# Patient Record
Sex: Female | Born: 1940 | Race: Black or African American | Hispanic: No | Marital: Married | Smoking: Never smoker
Health system: Southern US, Community
[De-identification: ages and names within clinical notes are randomized; demographics above are authoritative.]

## PROBLEM LIST (undated history)

## (undated) DIAGNOSIS — R569 Unspecified convulsions: Secondary | ICD-10-CM

## (undated) DIAGNOSIS — K449 Diaphragmatic hernia without obstruction or gangrene: Secondary | ICD-10-CM

## (undated) DIAGNOSIS — K219 Gastro-esophageal reflux disease without esophagitis: Secondary | ICD-10-CM

## (undated) DIAGNOSIS — R55 Syncope and collapse: Secondary | ICD-10-CM

## (undated) DIAGNOSIS — I10 Essential (primary) hypertension: Secondary | ICD-10-CM

## (undated) DIAGNOSIS — D72819 Decreased white blood cell count, unspecified: Secondary | ICD-10-CM

## (undated) DIAGNOSIS — C801 Malignant (primary) neoplasm, unspecified: Secondary | ICD-10-CM

## (undated) HISTORY — DX: Syncope and collapse: R55

## (undated) HISTORY — DX: Decreased white blood cell count, unspecified: D72.819

## (undated) HISTORY — PX: COLON SURGERY: SHX602

## (undated) HISTORY — DX: Diaphragmatic hernia without obstruction or gangrene: K44.9

---

## 2000-01-27 ENCOUNTER — Encounter: Payer: Self-pay | Admitting: Family Medicine

## 2000-01-27 ENCOUNTER — Encounter: Admission: RE | Admit: 2000-01-27 | Discharge: 2000-01-27 | Payer: Self-pay | Admitting: Family Medicine

## 2001-02-04 ENCOUNTER — Encounter: Payer: Self-pay | Admitting: Family Medicine

## 2001-02-04 ENCOUNTER — Encounter: Admission: RE | Admit: 2001-02-04 | Discharge: 2001-02-04 | Payer: Self-pay | Admitting: Family Medicine

## 2003-11-23 ENCOUNTER — Encounter: Admission: RE | Admit: 2003-11-23 | Discharge: 2003-11-23 | Payer: Self-pay | Admitting: Family Medicine

## 2004-11-22 ENCOUNTER — Emergency Department (HOSPITAL_COMMUNITY): Admission: EM | Admit: 2004-11-22 | Discharge: 2004-11-23 | Payer: Self-pay | Admitting: Emergency Medicine

## 2004-11-28 ENCOUNTER — Inpatient Hospital Stay (HOSPITAL_COMMUNITY): Admission: EM | Admit: 2004-11-28 | Discharge: 2004-11-30 | Payer: Self-pay | Admitting: Emergency Medicine

## 2004-11-28 ENCOUNTER — Encounter (INDEPENDENT_AMBULATORY_CARE_PROVIDER_SITE_OTHER): Payer: Self-pay | Admitting: Specialist

## 2005-08-16 ENCOUNTER — Ambulatory Visit: Payer: Self-pay | Admitting: Gastroenterology

## 2005-10-03 ENCOUNTER — Ambulatory Visit: Payer: Self-pay | Admitting: Gastroenterology

## 2005-10-10 ENCOUNTER — Ambulatory Visit: Payer: Self-pay | Admitting: Gastroenterology

## 2006-09-04 ENCOUNTER — Encounter: Admission: RE | Admit: 2006-09-04 | Discharge: 2006-09-04 | Payer: Self-pay | Admitting: Family Medicine

## 2008-03-13 ENCOUNTER — Encounter: Admission: RE | Admit: 2008-03-13 | Discharge: 2008-03-13 | Payer: Self-pay | Admitting: Family Medicine

## 2008-04-17 ENCOUNTER — Telehealth: Payer: Self-pay | Admitting: Gastroenterology

## 2008-05-12 ENCOUNTER — Ambulatory Visit: Payer: Self-pay | Admitting: Gastroenterology

## 2008-05-12 DIAGNOSIS — K219 Gastro-esophageal reflux disease without esophagitis: Secondary | ICD-10-CM | POA: Insufficient documentation

## 2008-06-04 ENCOUNTER — Ambulatory Visit: Payer: Self-pay | Admitting: Gastroenterology

## 2008-06-17 ENCOUNTER — Ambulatory Visit: Payer: Self-pay | Admitting: Gastroenterology

## 2008-07-24 HISTORY — PX: COLON SURGERY: SHX602

## 2009-05-05 ENCOUNTER — Encounter: Admission: RE | Admit: 2009-05-05 | Discharge: 2009-05-05 | Payer: Self-pay | Admitting: Family Medicine

## 2009-05-12 ENCOUNTER — Encounter: Admission: RE | Admit: 2009-05-12 | Discharge: 2009-05-12 | Payer: Self-pay | Admitting: General Surgery

## 2009-05-27 ENCOUNTER — Ambulatory Visit (HOSPITAL_COMMUNITY): Admission: RE | Admit: 2009-05-27 | Discharge: 2009-05-27 | Payer: Self-pay | Admitting: General Surgery

## 2009-05-27 ENCOUNTER — Ambulatory Visit: Payer: Self-pay | Admitting: Cardiology

## 2009-05-27 ENCOUNTER — Encounter: Admission: RE | Admit: 2009-05-27 | Discharge: 2009-05-27 | Payer: Self-pay | Admitting: General Surgery

## 2009-05-27 ENCOUNTER — Inpatient Hospital Stay (HOSPITAL_COMMUNITY): Admission: AD | Admit: 2009-05-27 | Discharge: 2009-06-14 | Payer: Self-pay | Admitting: General Surgery

## 2009-06-01 ENCOUNTER — Encounter (INDEPENDENT_AMBULATORY_CARE_PROVIDER_SITE_OTHER): Payer: Self-pay | Admitting: General Surgery

## 2009-06-09 ENCOUNTER — Ambulatory Visit: Payer: Self-pay | Admitting: Oncology

## 2009-06-09 ENCOUNTER — Encounter: Payer: Self-pay | Admitting: Oncology

## 2009-06-10 ENCOUNTER — Encounter (INDEPENDENT_AMBULATORY_CARE_PROVIDER_SITE_OTHER): Payer: Self-pay | Admitting: General Surgery

## 2009-06-23 LAB — COMPREHENSIVE METABOLIC PANEL
AST: 26 U/L (ref 0–37)
Albumin: 3.2 g/dL — ABNORMAL LOW (ref 3.5–5.2)
Alkaline Phosphatase: 70 U/L (ref 39–117)
BUN: 5 mg/dL — ABNORMAL LOW (ref 6–23)
Potassium: 3.4 mEq/L — ABNORMAL LOW (ref 3.5–5.3)
Sodium: 140 mEq/L (ref 135–145)

## 2009-06-23 LAB — MORPHOLOGY: RBC Comments: NORMAL

## 2009-06-23 LAB — CBC WITH DIFFERENTIAL/PLATELET
Eosinophils Absolute: 0.1 10*3/uL (ref 0.0–0.5)
MONO#: 0.5 10*3/uL (ref 0.1–0.9)
MONO%: 7.5 % (ref 0.0–14.0)
NEUT#: 4.6 10*3/uL (ref 1.5–6.5)
RBC: 3.88 10*6/uL (ref 3.70–5.45)
RDW: 16.6 % — ABNORMAL HIGH (ref 11.2–14.5)
WBC: 6.1 10*3/uL (ref 3.9–10.3)
lymph#: 0.9 10*3/uL (ref 0.9–3.3)

## 2009-07-02 ENCOUNTER — Ambulatory Visit (HOSPITAL_BASED_OUTPATIENT_CLINIC_OR_DEPARTMENT_OTHER): Admission: RE | Admit: 2009-07-02 | Discharge: 2009-07-02 | Payer: Self-pay | Admitting: General Surgery

## 2009-07-05 LAB — CBC WITH DIFFERENTIAL/PLATELET
Basophils Absolute: 0 10*3/uL (ref 0.0–0.1)
Eosinophils Absolute: 0.1 10*3/uL (ref 0.0–0.5)
LYMPH%: 7.8 % — ABNORMAL LOW (ref 14.0–49.7)
MCV: 86.6 fL (ref 79.5–101.0)
MONO%: 2.4 % (ref 0.0–14.0)
NEUT#: 6.7 10*3/uL — ABNORMAL HIGH (ref 1.5–6.5)
NEUT%: 87.7 % — ABNORMAL HIGH (ref 38.4–76.8)
Platelets: ADEQUATE 10*3/uL (ref 145–400)
RBC: 4.04 10*6/uL (ref 3.70–5.45)
nRBC: 0 % (ref 0–0)

## 2009-07-07 ENCOUNTER — Encounter: Payer: Self-pay | Admitting: Gastroenterology

## 2009-07-14 ENCOUNTER — Ambulatory Visit: Payer: Self-pay | Admitting: Oncology

## 2009-07-19 LAB — CBC WITH DIFFERENTIAL/PLATELET
Basophils Absolute: 0.1 10*3/uL (ref 0.0–0.1)
EOS%: 0.1 % (ref 0.0–7.0)
HCT: 29.8 % — ABNORMAL LOW (ref 34.8–46.6)
HGB: 10 g/dL — ABNORMAL LOW (ref 11.6–15.9)
MCH: 29.3 pg (ref 25.1–34.0)
MCV: 87.4 fL (ref 79.5–101.0)
MONO%: 9.4 % (ref 0.0–14.0)
NEUT%: 70 % (ref 38.4–76.8)
Platelets: 389 10*3/uL (ref 145–400)

## 2009-07-19 LAB — COMPREHENSIVE METABOLIC PANEL
AST: 11 U/L (ref 0–37)
Albumin: 3.9 g/dL (ref 3.5–5.2)
BUN: 11 mg/dL (ref 6–23)
Calcium: 8.6 mg/dL (ref 8.4–10.5)
Chloride: 105 mEq/L (ref 96–112)
Glucose, Bld: 84 mg/dL (ref 70–99)
Potassium: 3.3 mEq/L — ABNORMAL LOW (ref 3.5–5.3)
Sodium: 141 mEq/L (ref 135–145)
Total Protein: 6.4 g/dL (ref 6.0–8.3)

## 2009-07-23 LAB — WOUND CULTURE

## 2009-07-26 LAB — CBC WITH DIFFERENTIAL/PLATELET
Eosinophils Absolute: 0 10*3/uL (ref 0.0–0.5)
HCT: 29.8 % — ABNORMAL LOW (ref 34.8–46.6)
LYMPH%: 5.7 % — ABNORMAL LOW (ref 14.0–49.7)
MONO#: 0.3 10*3/uL (ref 0.1–0.9)
NEUT#: 6.8 10*3/uL — ABNORMAL HIGH (ref 1.5–6.5)
NEUT%: 90.3 % — ABNORMAL HIGH (ref 38.4–76.8)
Platelets: 543 10*3/uL — ABNORMAL HIGH (ref 145–400)
RBC: 3.46 10*6/uL — ABNORMAL LOW (ref 3.70–5.45)
WBC: 7.5 10*3/uL (ref 3.9–10.3)

## 2009-08-09 LAB — COMPREHENSIVE METABOLIC PANEL
ALT: 13 U/L (ref 0–35)
AST: 13 U/L (ref 0–37)
Alkaline Phosphatase: 71 U/L (ref 39–117)
Creatinine, Ser: 0.64 mg/dL (ref 0.40–1.20)
Total Bilirubin: 0.2 mg/dL — ABNORMAL LOW (ref 0.3–1.2)

## 2009-08-09 LAB — CBC WITH DIFFERENTIAL/PLATELET
BASO%: 0.5 % (ref 0.0–2.0)
Basophils Absolute: 0 10*3/uL (ref 0.0–0.1)
HCT: 28.7 % — ABNORMAL LOW (ref 34.8–46.6)
LYMPH%: 15.4 % (ref 14.0–49.7)
MCHC: 31.7 g/dL (ref 31.5–36.0)
MONO#: 0.6 10*3/uL (ref 0.1–0.9)
NEUT%: 72.2 % (ref 38.4–76.8)
Platelets: 222 10*3/uL (ref 145–400)
WBC: 6.1 10*3/uL (ref 3.9–10.3)

## 2009-08-09 LAB — MORPHOLOGY: PLT EST: ADEQUATE

## 2009-08-12 ENCOUNTER — Ambulatory Visit: Payer: Self-pay | Admitting: Oncology

## 2009-08-16 LAB — CBC WITH DIFFERENTIAL/PLATELET
Basophils Absolute: 0 10*3/uL (ref 0.0–0.1)
Eosinophils Absolute: 0 10*3/uL (ref 0.0–0.5)
HGB: 9.6 g/dL — ABNORMAL LOW (ref 11.6–15.9)
LYMPH%: 6.3 % — ABNORMAL LOW (ref 14.0–49.7)
MCV: 88.1 fL (ref 79.5–101.0)
MONO#: 0.2 10*3/uL (ref 0.1–0.9)
MONO%: 3.8 % (ref 0.0–14.0)
NEUT#: 5.4 10*3/uL (ref 1.5–6.5)
Platelets: 358 10*3/uL (ref 145–400)
WBC: 6.1 10*3/uL (ref 3.9–10.3)

## 2009-08-30 LAB — CBC WITH DIFFERENTIAL/PLATELET
Basophils Absolute: 0 10*3/uL (ref 0.0–0.1)
EOS%: 1.7 % (ref 0.0–7.0)
HCT: 25.1 % — ABNORMAL LOW (ref 34.8–46.6)
HGB: 8 g/dL — ABNORMAL LOW (ref 11.6–15.9)
LYMPH%: 22.3 % (ref 14.0–49.7)
MCH: 28.6 pg (ref 25.1–34.0)
MCV: 89.6 fL (ref 79.5–101.0)
MONO%: 10.4 % (ref 0.0–14.0)
NEUT%: 65.1 % (ref 38.4–76.8)
Platelets: 215 10*3/uL (ref 145–400)
lymph#: 0.9 10*3/uL (ref 0.9–3.3)

## 2009-08-30 LAB — COMPREHENSIVE METABOLIC PANEL
AST: 13 U/L (ref 0–37)
BUN: 8 mg/dL (ref 6–23)
Calcium: 9.2 mg/dL (ref 8.4–10.5)
Chloride: 107 mEq/L (ref 96–112)
Creatinine, Ser: 0.59 mg/dL (ref 0.40–1.20)

## 2009-08-30 LAB — LACTATE DEHYDROGENASE: LDH: 318 U/L — ABNORMAL HIGH (ref 94–250)

## 2009-09-06 LAB — CBC WITH DIFFERENTIAL/PLATELET
Eosinophils Absolute: 0 10*3/uL (ref 0.0–0.5)
MONO#: 0.4 10*3/uL (ref 0.1–0.9)
NEUT#: 2.4 10*3/uL (ref 1.5–6.5)
Platelets: 368 10*3/uL (ref 145–400)
RBC: 2.91 10*6/uL — ABNORMAL LOW (ref 3.70–5.45)
RDW: 18 % — ABNORMAL HIGH (ref 11.2–14.5)
WBC: 3.4 10*3/uL — ABNORMAL LOW (ref 3.9–10.3)
nRBC: 0 % (ref 0–0)

## 2009-09-20 ENCOUNTER — Ambulatory Visit: Payer: Self-pay | Admitting: Oncology

## 2009-09-20 ENCOUNTER — Encounter (HOSPITAL_COMMUNITY): Admission: RE | Admit: 2009-09-20 | Discharge: 2009-12-14 | Payer: Self-pay | Admitting: Oncology

## 2009-09-20 LAB — CBC WITH DIFFERENTIAL/PLATELET
Basophils Absolute: 0 10*3/uL (ref 0.0–0.1)
HCT: 22.8 % — ABNORMAL LOW (ref 34.8–46.6)
HGB: 7.7 g/dL — ABNORMAL LOW (ref 11.6–15.9)
MONO#: 0.4 10*3/uL (ref 0.1–0.9)
NEUT#: 3.2 10*3/uL (ref 1.5–6.5)
NEUT%: 74.7 % (ref 38.4–76.8)
WBC: 4.2 10*3/uL (ref 3.9–10.3)
lymph#: 0.6 10*3/uL — ABNORMAL LOW (ref 0.9–3.3)

## 2009-09-20 LAB — COMPREHENSIVE METABOLIC PANEL
AST: 15 U/L (ref 0–37)
BUN: 8 mg/dL (ref 6–23)
CO2: 24 mEq/L (ref 19–32)
Chloride: 108 mEq/L (ref 96–112)
Creatinine, Ser: 0.61 mg/dL (ref 0.40–1.20)
Total Protein: 6.3 g/dL (ref 6.0–8.3)

## 2009-09-20 LAB — TYPE & CROSSMATCH - CHCC

## 2009-09-22 ENCOUNTER — Ambulatory Visit (HOSPITAL_COMMUNITY): Admission: RE | Admit: 2009-09-22 | Discharge: 2009-09-22 | Payer: Self-pay | Admitting: Oncology

## 2009-10-07 ENCOUNTER — Ambulatory Visit (HOSPITAL_COMMUNITY): Admission: RE | Admit: 2009-10-07 | Discharge: 2009-10-07 | Payer: Self-pay | Admitting: Oncology

## 2009-11-11 ENCOUNTER — Encounter: Admission: RE | Admit: 2009-11-11 | Discharge: 2009-11-11 | Payer: Self-pay | Admitting: General Surgery

## 2009-11-17 ENCOUNTER — Ambulatory Visit: Payer: Self-pay | Admitting: Oncology

## 2009-11-29 ENCOUNTER — Inpatient Hospital Stay (HOSPITAL_COMMUNITY): Admission: RE | Admit: 2009-11-29 | Discharge: 2009-12-08 | Payer: Self-pay | Admitting: General Surgery

## 2009-11-29 ENCOUNTER — Encounter (INDEPENDENT_AMBULATORY_CARE_PROVIDER_SITE_OTHER): Payer: Self-pay | Admitting: General Surgery

## 2009-12-17 ENCOUNTER — Ambulatory Visit: Payer: Self-pay | Admitting: Oncology

## 2009-12-21 LAB — CBC WITH DIFFERENTIAL/PLATELET
Basophils Absolute: 0 10*3/uL (ref 0.0–0.1)
Eosinophils Absolute: 0.3 10*3/uL (ref 0.0–0.5)
HGB: 9.9 g/dL — ABNORMAL LOW (ref 11.6–15.9)
MONO#: 0.3 10*3/uL (ref 0.1–0.9)
NEUT#: 2.6 10*3/uL (ref 1.5–6.5)
Platelets: 329 10*3/uL (ref 145–400)
RBC: 3.34 10*6/uL — ABNORMAL LOW (ref 3.70–5.45)
RDW: 14 % (ref 11.2–14.5)
WBC: 3.9 10*3/uL (ref 3.9–10.3)

## 2009-12-21 LAB — COMPREHENSIVE METABOLIC PANEL
ALT: 10 U/L (ref 0–35)
AST: 13 U/L (ref 0–37)
Alkaline Phosphatase: 60 U/L (ref 39–117)
Potassium: 3 mEq/L — ABNORMAL LOW (ref 3.5–5.3)
Sodium: 141 mEq/L (ref 135–145)
Total Bilirubin: 0.7 mg/dL (ref 0.3–1.2)
Total Protein: 6.4 g/dL (ref 6.0–8.3)

## 2009-12-21 LAB — MORPHOLOGY: PLT EST: ADEQUATE

## 2009-12-31 LAB — BASIC METABOLIC PANEL
BUN: 12 mg/dL (ref 6–23)
Creatinine, Ser: 0.75 mg/dL (ref 0.40–1.20)
Potassium: 4.3 mEq/L (ref 3.5–5.3)

## 2010-02-01 ENCOUNTER — Ambulatory Visit (HOSPITAL_BASED_OUTPATIENT_CLINIC_OR_DEPARTMENT_OTHER): Admission: RE | Admit: 2010-02-01 | Discharge: 2010-02-01 | Payer: Self-pay | Admitting: General Surgery

## 2010-02-10 ENCOUNTER — Ambulatory Visit: Payer: Self-pay | Admitting: Oncology

## 2010-02-10 ENCOUNTER — Ambulatory Visit (HOSPITAL_COMMUNITY): Admission: RE | Admit: 2010-02-10 | Discharge: 2010-02-10 | Payer: Self-pay | Admitting: Oncology

## 2010-02-10 LAB — CBC WITH DIFFERENTIAL/PLATELET
BASO%: 0.4 % (ref 0.0–2.0)
Basophils Absolute: 0 10*3/uL (ref 0.0–0.1)
Eosinophils Absolute: 0.1 10*3/uL (ref 0.0–0.5)
HCT: 30.2 % — ABNORMAL LOW (ref 34.8–46.6)
HGB: 9.9 g/dL — ABNORMAL LOW (ref 11.6–15.9)
LYMPH%: 36.3 % (ref 14.0–49.7)
MONO#: 0.2 10*3/uL (ref 0.1–0.9)
NEUT#: 1.4 10*3/uL — ABNORMAL LOW (ref 1.5–6.5)
NEUT%: 52.6 % (ref 38.4–76.8)
Platelets: 175 10*3/uL (ref 145–400)
WBC: 2.7 10*3/uL — ABNORMAL LOW (ref 3.9–10.3)
lymph#: 1 10*3/uL (ref 0.9–3.3)

## 2010-02-10 LAB — COMPREHENSIVE METABOLIC PANEL
AST: 15 U/L (ref 0–37)
Albumin: 4.2 g/dL (ref 3.5–5.2)
BUN: 12 mg/dL (ref 6–23)
CO2: 26 mEq/L (ref 19–32)
Calcium: 9 mg/dL (ref 8.4–10.5)
Chloride: 108 mEq/L (ref 96–112)
Creatinine, Ser: 0.62 mg/dL (ref 0.40–1.20)
Potassium: 3.9 mEq/L (ref 3.5–5.3)

## 2010-02-10 LAB — LACTATE DEHYDROGENASE: LDH: 110 U/L (ref 94–250)

## 2010-03-15 ENCOUNTER — Ambulatory Visit: Payer: Self-pay | Admitting: Oncology

## 2010-03-17 LAB — CBC WITH DIFFERENTIAL/PLATELET
Eosinophils Absolute: 0.1 10*3/uL (ref 0.0–0.5)
HCT: 31.8 % — ABNORMAL LOW (ref 34.8–46.6)
LYMPH%: 39.7 % (ref 14.0–49.7)
MCHC: 32.7 g/dL (ref 31.5–36.0)
MCV: 87.4 fL (ref 79.5–101.0)
MONO#: 0.3 10*3/uL (ref 0.1–0.9)
MONO%: 10.1 % (ref 0.0–14.0)
NEUT#: 1.1 10*3/uL — ABNORMAL LOW (ref 1.5–6.5)
NEUT%: 46.2 % (ref 38.4–76.8)
Platelets: 181 10*3/uL (ref 145–400)
RBC: 3.64 10*6/uL — ABNORMAL LOW (ref 3.70–5.45)

## 2010-03-17 LAB — COMPREHENSIVE METABOLIC PANEL
Alkaline Phosphatase: 76 U/L (ref 39–117)
CO2: 25 mEq/L (ref 19–32)
Creatinine, Ser: 0.65 mg/dL (ref 0.40–1.20)
Glucose, Bld: 97 mg/dL (ref 70–99)
Sodium: 140 mEq/L (ref 135–145)
Total Bilirubin: 0.3 mg/dL (ref 0.3–1.2)
Total Protein: 6.7 g/dL (ref 6.0–8.3)

## 2010-03-17 LAB — LACTATE DEHYDROGENASE: LDH: 128 U/L (ref 94–250)

## 2010-06-17 ENCOUNTER — Ambulatory Visit: Payer: Self-pay | Admitting: Oncology

## 2010-06-21 ENCOUNTER — Ambulatory Visit (HOSPITAL_COMMUNITY)
Admission: RE | Admit: 2010-06-21 | Discharge: 2010-06-21 | Payer: Self-pay | Source: Home / Self Care | Admitting: Oncology

## 2010-06-21 LAB — CBC WITH DIFFERENTIAL/PLATELET
Basophils Absolute: 0 10*3/uL (ref 0.0–0.1)
HCT: 32.8 % — ABNORMAL LOW (ref 34.8–46.6)
HGB: 11 g/dL — ABNORMAL LOW (ref 11.6–15.9)
LYMPH%: 38.2 % (ref 14.0–49.7)
MCH: 30 pg (ref 25.1–34.0)
MONO#: 0.3 10*3/uL (ref 0.1–0.9)
NEUT%: 43.1 % (ref 38.4–76.8)
Platelets: 210 10*3/uL (ref 145–400)
lymph#: 0.8 10*3/uL — ABNORMAL LOW (ref 0.9–3.3)

## 2010-06-21 LAB — COMPREHENSIVE METABOLIC PANEL
ALT: 27 U/L (ref 0–35)
AST: 22 U/L (ref 0–37)
Albumin: 4 g/dL (ref 3.5–5.2)
Alkaline Phosphatase: 81 U/L (ref 39–117)
Potassium: 3.6 mEq/L (ref 3.5–5.3)
Sodium: 141 mEq/L (ref 135–145)
Total Protein: 6.6 g/dL (ref 6.0–8.3)

## 2010-06-21 LAB — MORPHOLOGY

## 2010-06-24 ENCOUNTER — Encounter: Payer: Self-pay | Admitting: Gastroenterology

## 2010-08-12 ENCOUNTER — Other Ambulatory Visit: Payer: Self-pay | Admitting: Oncology

## 2010-08-12 DIAGNOSIS — C179 Malignant neoplasm of small intestine, unspecified: Secondary | ICD-10-CM

## 2010-08-12 DIAGNOSIS — C8590 Non-Hodgkin lymphoma, unspecified, unspecified site: Secondary | ICD-10-CM

## 2010-08-14 ENCOUNTER — Encounter: Payer: Self-pay | Admitting: Oncology

## 2010-08-25 NOTE — Letter (Signed)
Summary: [facility]  [facility]   Imported By: Sherian Rein 07/01/2010 07:24:49  _____________________________________________________________________  External Attachment:    Type:   Image     Comment:   External Document

## 2010-10-10 LAB — BASIC METABOLIC PANEL
BUN: 2 mg/dL — ABNORMAL LOW (ref 6–23)
GFR calc Af Amer: >60 mL/min (ref >60)
GFR calc non Af Amer: >60 mL/min (ref >60)
Potassium: 3.4 mEq/L — ABNORMAL LOW (ref 3.5–5.1)
Sodium: 142 mEq/L (ref 135–145)

## 2010-10-10 LAB — CBC
HCT: 27 % — ABNORMAL LOW (ref 36.0–46.0)
HCT: 28 % — ABNORMAL LOW (ref 36.0–46.0)
HCT: 28.2 % — ABNORMAL LOW (ref 36.0–46.0)
MCHC: 32.5 g/dL (ref 30.0–36.0)
MCV: 90.6 fL (ref 78.0–100.0)
Platelets: 206 10*3/uL (ref 150–400)
Platelets: 208 10*3/uL (ref 150–400)
Platelets: 246 10*3/uL (ref 150–400)
RDW: 14.3 % (ref 11.5–15.5)
RDW: 15.4 % (ref 11.5–15.5)
WBC: 4 10*3/uL (ref 4.0–10.5)

## 2010-10-11 LAB — CBC
HCT: 21 % — ABNORMAL LOW (ref 36.0–46.0)
HCT: 31.5 % — ABNORMAL LOW (ref 36.0–46.0)
Hemoglobin: 10.2 g/dL — ABNORMAL LOW (ref 12.0–15.0)
Hemoglobin: 10.9 g/dL — ABNORMAL LOW (ref 12.0–15.0)
Hemoglobin: 6.9 g/dL — CL (ref 12.0–15.0)
Hemoglobin: 8.1 g/dL — ABNORMAL LOW (ref 12.0–15.0)
Hemoglobin: 8.6 g/dL — ABNORMAL LOW (ref 12.0–15.0)
MCHC: 32.3 g/dL (ref 30.0–36.0)
MCHC: 32.8 g/dL (ref 30.0–36.0)
MCHC: 33.4 g/dL (ref 30.0–36.0)
MCHC: 33.4 g/dL (ref 30.0–36.0)
MCV: 88.7 fL (ref 78.0–100.0)
MCV: 90 fL (ref 78.0–100.0)
MCV: 92.2 fL (ref 78.0–100.0)
Platelets: 141 10*3/uL — ABNORMAL LOW (ref 150–400)
Platelets: 160 10*3/uL (ref 150–400)
Platelets: 166 10*3/uL (ref 150–400)
Platelets: 197 10*3/uL (ref 150–400)
RBC: 2.92 MIL/uL — ABNORMAL LOW (ref 3.87–5.11)
RDW: 13.7 % (ref 11.5–15.5)
RDW: 14.1 % (ref 11.5–15.5)
RDW: 14.2 % (ref 11.5–15.5)
RDW: 14.4 % (ref 11.5–15.5)
RDW: 14.9 % (ref 11.5–15.5)
WBC: 2.9 10*3/uL — ABNORMAL LOW (ref 4.0–10.5)
WBC: 3 10*3/uL — ABNORMAL LOW (ref 4.0–10.5)

## 2010-10-11 LAB — POCT I-STAT 4, (NA,K, GLUC, HGB,HCT)
Glucose, Bld: 93 mg/dL (ref 70–99)
Glucose, Bld: 99 mg/dL (ref 70–99)
HCT: 20 % — ABNORMAL LOW (ref 36.0–46.0)
HCT: 26 % — ABNORMAL LOW (ref 36.0–46.0)
Hemoglobin: 6.8 g/dL — CL (ref 12.0–15.0)
Potassium: 4 mEq/L (ref 3.5–5.1)
Sodium: 140 mEq/L (ref 135–145)
Sodium: 141 mEq/L (ref 135–145)

## 2010-10-11 LAB — BASIC METABOLIC PANEL
BUN: 8 mg/dL (ref 6–23)
CO2: 27 mEq/L (ref 19–32)
Calcium: 8.4 mg/dL (ref 8.4–10.5)
Chloride: 105 mEq/L (ref 96–112)
GFR calc Af Amer: >60 mL/min (ref >60)
GFR calc non Af Amer: >60 mL/min (ref >60)
GFR calc non Af Amer: >60 mL/min (ref >60)
Glucose, Bld: 106 mg/dL — ABNORMAL HIGH (ref 70–99)
Glucose, Bld: 137 mg/dL — ABNORMAL HIGH (ref 70–99)
Potassium: 3.8 mEq/L (ref 3.5–5.1)
Potassium: 4.8 mEq/L (ref 3.5–5.1)
Sodium: 137 mEq/L (ref 135–145)
Sodium: 140 mEq/L (ref 135–145)

## 2010-10-11 LAB — CROSSMATCH: Antibody Screen: NEGATIVE

## 2010-10-11 LAB — POCT I-STAT EG7
Calcium, Ion: 1.17 mmol/L (ref 1.12–1.32)
O2 Saturation: 99 %
Potassium: 4 mEq/L (ref 3.5–5.1)
Sodium: 141 mEq/L (ref 135–145)
TCO2: 28 mmol/L (ref 0–100)
pCO2, Ven: 47.8 mmHg (ref 45.0–50.0)
pH, Ven: 7.352 — ABNORMAL HIGH (ref 7.250–7.300)

## 2010-10-11 LAB — COMPREHENSIVE METABOLIC PANEL
AST: 14 U/L (ref 0–37)
Albumin: 4 g/dL (ref 3.5–5.2)
Alkaline Phosphatase: 77 U/L (ref 39–117)
BUN: 12 mg/dL (ref 6–23)
Chloride: 106 mEq/L (ref 96–112)
Potassium: 3.9 mEq/L (ref 3.5–5.1)
Total Bilirubin: 0.5 mg/dL (ref 0.3–1.2)

## 2010-10-11 LAB — DIFFERENTIAL
Basophils Absolute: 0 10*3/uL (ref 0.0–0.1)
Lymphocytes Relative: 24 % (ref 12–46)
Neutro Abs: 1.6 10*3/uL — ABNORMAL LOW (ref 1.7–7.7)
Neutrophils Relative %: 58 % (ref 43–77)

## 2010-10-12 LAB — CROSSMATCH
ABO/RH(D): A POS
Antibody Screen: NEGATIVE

## 2010-10-20 ENCOUNTER — Other Ambulatory Visit: Payer: Self-pay | Admitting: Oncology

## 2010-10-20 ENCOUNTER — Ambulatory Visit (HOSPITAL_COMMUNITY)
Admission: RE | Admit: 2010-10-20 | Discharge: 2010-10-20 | Disposition: A | Discharge status: Home / Self Care | Payer: Medicare Other | Source: Ambulatory Visit | Attending: Oncology | Admitting: Oncology

## 2010-10-20 ENCOUNTER — Ambulatory Visit (HOSPITAL_COMMUNITY): Admission: RE | Admit: 2010-10-20 | Payer: Self-pay | Source: Ambulatory Visit

## 2010-10-20 ENCOUNTER — Encounter (HOSPITAL_BASED_OUTPATIENT_CLINIC_OR_DEPARTMENT_OTHER): Payer: Medicare Other | Admitting: Oncology

## 2010-10-20 DIAGNOSIS — K5730 Diverticulosis of large intestine without perforation or abscess without bleeding: Secondary | ICD-10-CM | POA: Insufficient documentation

## 2010-10-20 DIAGNOSIS — K219 Gastro-esophageal reflux disease without esophagitis: Secondary | ICD-10-CM

## 2010-10-20 DIAGNOSIS — C179 Malignant neoplasm of small intestine, unspecified: Secondary | ICD-10-CM

## 2010-10-20 DIAGNOSIS — Z87898 Personal history of other specified conditions: Secondary | ICD-10-CM | POA: Insufficient documentation

## 2010-10-20 DIAGNOSIS — C8589 Other specified types of non-Hodgkin lymphoma, extranodal and solid organ sites: Secondary | ICD-10-CM

## 2010-10-20 DIAGNOSIS — I10 Essential (primary) hypertension: Secondary | ICD-10-CM

## 2010-10-20 DIAGNOSIS — Z09 Encounter for follow-up examination after completed treatment for conditions other than malignant neoplasm: Secondary | ICD-10-CM | POA: Insufficient documentation

## 2010-10-20 DIAGNOSIS — K449 Diaphragmatic hernia without obstruction or gangrene: Secondary | ICD-10-CM | POA: Insufficient documentation

## 2010-10-20 LAB — CMP (CANCER CENTER ONLY)
ALT(SGPT): 18 U/L (ref 10–47)
CO2: 27 mEq/L (ref 18–33)
Calcium: 8.7 mg/dL (ref 8.0–10.3)
Chloride: 103 mEq/L (ref 98–108)
Creat: 0.7 mg/dl (ref 0.6–1.2)
Glucose, Bld: 89 mg/dL (ref 73–118)

## 2010-10-20 LAB — CBC WITH DIFFERENTIAL/PLATELET
BASO%: 0.4 % (ref 0.0–2.0)
EOS%: 4.2 % (ref 0.0–7.0)
Eosinophils Absolute: 0.1 10*3/uL (ref 0.0–0.5)
LYMPH%: 42.3 % (ref 14.0–49.7)
MCH: 28.6 pg (ref 25.1–34.0)
MCHC: 33 g/dL (ref 31.5–36.0)
MCV: 86.6 fL (ref 79.5–101.0)
MONO%: 12.1 % (ref 0.0–14.0)
Platelets: 189 10*3/uL (ref 145–400)
RBC: 3.88 10*6/uL (ref 3.70–5.45)

## 2010-10-20 LAB — MORPHOLOGY: PLT EST: ADEQUATE

## 2010-10-20 LAB — SEDIMENTATION RATE: Sed Rate: 10 mm/hr (ref 0–22)

## 2010-10-20 MED ORDER — IOHEXOL 300 MG/ML  SOLN
100.0000 mL | Freq: Once | INTRAMUSCULAR | Status: AC | PRN
  Administered 2010-10-20: 100 mL via INTRAVENOUS

## 2010-10-25 LAB — BASIC METABOLIC PANEL
BUN: 5 mg/dL — ABNORMAL LOW (ref 6–23)
Creatinine, Ser: 0.61 mg/dL (ref 0.4–1.2)
GFR calc non Af Amer: >60 mL/min (ref >60)
Potassium: 3.8 mEq/L (ref 3.5–5.1)

## 2010-10-25 LAB — POCT HEMOGLOBIN-HEMACUE: Hemoglobin: 10.6 g/dL — ABNORMAL LOW (ref 12.0–15.0)

## 2010-10-26 ENCOUNTER — Other Ambulatory Visit: Payer: Self-pay | Admitting: Oncology

## 2010-10-26 ENCOUNTER — Encounter (HOSPITAL_BASED_OUTPATIENT_CLINIC_OR_DEPARTMENT_OTHER): Payer: Medicare Other | Admitting: Oncology

## 2010-10-26 DIAGNOSIS — K5730 Diverticulosis of large intestine without perforation or abscess without bleeding: Secondary | ICD-10-CM

## 2010-10-26 DIAGNOSIS — C8589 Other specified types of non-Hodgkin lymphoma, extranodal and solid organ sites: Secondary | ICD-10-CM

## 2010-10-26 DIAGNOSIS — I10 Essential (primary) hypertension: Secondary | ICD-10-CM

## 2010-10-26 DIAGNOSIS — L819 Disorder of pigmentation, unspecified: Secondary | ICD-10-CM

## 2010-10-26 DIAGNOSIS — C8583 Other specified types of non-Hodgkin lymphoma, intra-abdominal lymph nodes: Secondary | ICD-10-CM

## 2010-10-26 LAB — CBC
HCT: 23.9 % — ABNORMAL LOW (ref 36.0–46.0)
HCT: 24 % — ABNORMAL LOW (ref 36.0–46.0)
HCT: 26.2 % — ABNORMAL LOW (ref 36.0–46.0)
HCT: 26.7 % — ABNORMAL LOW (ref 36.0–46.0)
HCT: 24.3 % — ABNORMAL LOW (ref 36.0–46.0)
HCT: 25.4 % — ABNORMAL LOW (ref 36.0–46.0)
HCT: 29.5 % — ABNORMAL LOW (ref 36.0–46.0)
HCT: 32.2 % — ABNORMAL LOW (ref 36.0–46.0)
Hemoglobin: 7.9 g/dL — ABNORMAL LOW (ref 12.0–15.0)
Hemoglobin: 8.6 g/dL — ABNORMAL LOW (ref 12.0–15.0)
Hemoglobin: 8.7 g/dL — ABNORMAL LOW (ref 12.0–15.0)
Hemoglobin: 8.4 g/dL — ABNORMAL LOW (ref 12.0–15.0)
Hemoglobin: 8.4 g/dL — ABNORMAL LOW (ref 12.0–15.0)
Hemoglobin: 8.8 g/dL — ABNORMAL LOW (ref 12.0–15.0)
Hemoglobin: 9.7 g/dL — ABNORMAL LOW (ref 12.0–15.0)
MCHC: 32.6 g/dL (ref 30.0–36.0)
MCHC: 32.8 g/dL (ref 30.0–36.0)
MCHC: 32.9 g/dL (ref 30.0–36.0)
MCHC: 33.1 g/dL (ref 30.0–36.0)
MCHC: 33.4 g/dL (ref 30.0–36.0)
MCHC: 32.8 g/dL (ref 30.0–36.0)
MCHC: 33 g/dL (ref 30.0–36.0)
MCHC: 33.7 g/dL (ref 30.0–36.0)
MCV: 84.6 fL (ref 78.0–100.0)
MCV: 84.7 fL (ref 78.0–100.0)
MCV: 85.3 fL (ref 78.0–100.0)
MCV: 86 fL (ref 78.0–100.0)
MCV: 83.3 fL (ref 78.0–100.0)
MCV: 83.4 fL (ref 78.0–100.0)
MCV: 83.7 fL (ref 78.0–100.0)
MCV: 85.6 fL (ref 78.0–100.0)
MCV: 85.9 fL (ref 78.0–100.0)
Platelets: 474 10*3/uL — ABNORMAL HIGH (ref 150–400)
Platelets: 652 10*3/uL — ABNORMAL HIGH (ref 150–400)
Platelets: 342 10*3/uL (ref 150–400)
Platelets: 357 10*3/uL (ref 150–400)
Platelets: 368 10*3/uL (ref 150–400)
Platelets: 418 10*3/uL — ABNORMAL HIGH (ref 150–400)
RBC: 2.8 MIL/uL — ABNORMAL LOW (ref 3.87–5.11)
RBC: 3.09 MIL/uL — ABNORMAL LOW (ref 3.87–5.11)
RBC: 3.15 MIL/uL — ABNORMAL LOW (ref 3.87–5.11)
RBC: 2.92 MIL/uL — ABNORMAL LOW (ref 3.87–5.11)
RBC: 2.94 MIL/uL — ABNORMAL LOW (ref 3.87–5.11)
RBC: 3.05 MIL/uL — ABNORMAL LOW (ref 3.87–5.11)
RBC: 3.52 MIL/uL — ABNORMAL LOW (ref 3.87–5.11)
RBC: 3.76 MIL/uL — ABNORMAL LOW (ref 3.87–5.11)
RDW: 15.5 % (ref 11.5–15.5)
RDW: 15.8 % — ABNORMAL HIGH (ref 11.5–15.5)
RDW: 16 % — ABNORMAL HIGH (ref 11.5–15.5)
RDW: 16 % — ABNORMAL HIGH (ref 11.5–15.5)
RDW: 16.4 % — ABNORMAL HIGH (ref 11.5–15.5)
RDW: 13.5 % (ref 11.5–15.5)
RDW: 14.1 % (ref 11.5–15.5)
RDW: 14.2 % (ref 11.5–15.5)
RDW: 14.2 % (ref 11.5–15.5)
RDW: 15 % (ref 11.5–15.5)
RDW: 15.2 % (ref 11.5–15.5)
WBC: 16.8 10*3/uL — ABNORMAL HIGH (ref 4.0–10.5)
WBC: 3.3 10*3/uL — ABNORMAL LOW (ref 4.0–10.5)
WBC: 3.3 10*3/uL — ABNORMAL LOW (ref 4.0–10.5)
WBC: 4.9 10*3/uL (ref 4.0–10.5)
WBC: 8.2 10*3/uL (ref 4.0–10.5)

## 2010-10-26 LAB — COMPREHENSIVE METABOLIC PANEL
ALT: <8 U/L (ref 0–35)
ALT: 12 U/L (ref 0–35)
AST: 15 U/L (ref 0–37)
AST: 15 U/L (ref 0–37)
Albumin: 3 g/dL — ABNORMAL LOW (ref 3.5–5.2)
Alkaline Phosphatase: 55 U/L (ref 39–117)
CO2: 29 mEq/L (ref 19–32)
CO2: 30 mEq/L (ref 19–32)
Calcium: 7.7 mg/dL — ABNORMAL LOW (ref 8.4–10.5)
Calcium: 9 mg/dL (ref 8.4–10.5)
Chloride: 97 mEq/L (ref 96–112)
Creatinine, Ser: 0.84 mg/dL (ref 0.4–1.2)
GFR calc Af Amer: >60 mL/min (ref >60)
GFR calc Af Amer: >60 mL/min (ref >60)
GFR calc non Af Amer: >60 mL/min (ref >60)
GFR calc non Af Amer: >60 mL/min (ref >60)
Glucose, Bld: 138 mg/dL — ABNORMAL HIGH (ref 70–99)
Potassium: 3.5 mEq/L (ref 3.5–5.1)
Sodium: 138 mEq/L (ref 135–145)
Sodium: 136 mEq/L (ref 135–145)
Total Bilirubin: 0.7 mg/dL (ref 0.3–1.2)
Total Protein: 5.1 g/dL — ABNORMAL LOW (ref 6.0–8.3)

## 2010-10-26 LAB — CROSSMATCH

## 2010-10-26 LAB — BASIC METABOLIC PANEL
BUN: 1 mg/dL — ABNORMAL LOW (ref 6–23)
BUN: 2 mg/dL — ABNORMAL LOW (ref 6–23)
CO2: 25 mEq/L (ref 19–32)
CO2: 26 mEq/L (ref 19–32)
CO2: 27 mEq/L (ref 19–32)
CO2: 26 mEq/L (ref 19–32)
CO2: 29 mEq/L (ref 19–32)
Calcium: 7.7 mg/dL — ABNORMAL LOW (ref 8.4–10.5)
Calcium: 7.5 mg/dL — ABNORMAL LOW (ref 8.4–10.5)
Calcium: 7.6 mg/dL — ABNORMAL LOW (ref 8.4–10.5)
Chloride: 109 mEq/L (ref 96–112)
Chloride: 107 mEq/L (ref 96–112)
Chloride: 108 mEq/L (ref 96–112)
Chloride: 112 mEq/L (ref 96–112)
Creatinine, Ser: 0.54 mg/dL (ref 0.4–1.2)
Creatinine, Ser: 0.65 mg/dL (ref 0.4–1.2)
Creatinine, Ser: 0.66 mg/dL (ref 0.4–1.2)
GFR calc Af Amer: >60 mL/min (ref >60)
GFR calc Af Amer: >60 mL/min (ref >60)
GFR calc Af Amer: >60 mL/min (ref >60)
GFR calc Af Amer: >60 mL/min (ref >60)
GFR calc Af Amer: >60 mL/min (ref >60)
GFR calc Af Amer: >60 mL/min (ref >60)
GFR calc Af Amer: >60 mL/min (ref >60)
GFR calc Af Amer: >60 mL/min (ref >60)
GFR calc non Af Amer: >60 mL/min (ref >60)
GFR calc non Af Amer: >60 mL/min (ref >60)
GFR calc non Af Amer: >60 mL/min (ref >60)
GFR calc non Af Amer: >60 mL/min (ref >60)
GFR calc non Af Amer: >60 mL/min (ref >60)
Glucose, Bld: 114 mg/dL — ABNORMAL HIGH (ref 70–99)
Glucose, Bld: 132 mg/dL — ABNORMAL HIGH (ref 70–99)
Glucose, Bld: 142 mg/dL — ABNORMAL HIGH (ref 70–99)
Glucose, Bld: 124 mg/dL — ABNORMAL HIGH (ref 70–99)
Glucose, Bld: 138 mg/dL — ABNORMAL HIGH (ref 70–99)
Glucose, Bld: 145 mg/dL — ABNORMAL HIGH (ref 70–99)
Glucose, Bld: 149 mg/dL — ABNORMAL HIGH (ref 70–99)
Potassium: 2.6 mEq/L — CL (ref 3.5–5.1)
Potassium: 3.2 mEq/L — ABNORMAL LOW (ref 3.5–5.1)
Potassium: 3.2 mEq/L — ABNORMAL LOW (ref 3.5–5.1)
Potassium: 3.6 mEq/L (ref 3.5–5.1)
Potassium: 3.8 mEq/L (ref 3.5–5.1)
Potassium: 4.5 mEq/L (ref 3.5–5.1)
Sodium: 138 mEq/L (ref 135–145)
Sodium: 139 mEq/L (ref 135–145)
Sodium: 142 mEq/L (ref 135–145)
Sodium: 138 mEq/L (ref 135–145)
Sodium: 138 mEq/L (ref 135–145)
Sodium: 139 mEq/L (ref 135–145)
Sodium: 140 mEq/L (ref 135–145)
Sodium: 141 mEq/L (ref 135–145)

## 2010-10-26 LAB — DIFFERENTIAL
Basophils Absolute: 0 10*3/uL (ref 0.0–0.1)
Basophils Relative: 0 % (ref 0–1)
Eosinophils Absolute: 0 10*3/uL (ref 0.0–0.7)
Eosinophils Relative: 1 % (ref 0–5)
Eosinophils Relative: 0 % (ref 0–5)
Lymphocytes Relative: 12 % (ref 12–46)
Lymphs Abs: 1 10*3/uL (ref 0.7–4.0)
Monocytes Absolute: 0.4 10*3/uL (ref 0.1–1.0)
Monocytes Absolute: 1 10*3/uL (ref 0.1–1.0)
Monocytes Relative: 6 % (ref 3–12)

## 2010-10-26 LAB — URINE CULTURE
Colony Count: NO GROWTH
Culture: NO GROWTH
Special Requests: POSITIVE

## 2010-10-26 LAB — CULTURE, ROUTINE-ABSCESS

## 2010-10-26 LAB — CREATININE, SERUM: GFR calc Af Amer: >60 mL/min (ref >60)

## 2010-10-26 LAB — HEPATITIS B SURFACE ANTIGEN: Hepatitis B Surface Ag: NEGATIVE

## 2010-10-26 LAB — HEPATITIS A ANTIBODY, IGM: Hep A IgM: NEGATIVE

## 2010-10-26 LAB — POTASSIUM: Potassium: 3.5 mEq/L (ref 3.5–5.1)

## 2010-10-26 LAB — EPSTEIN-BARR VIRUS VCA ANTIBODY PANEL
EBV VCA IgG: 8.98 {ISR} — ABNORMAL HIGH
EBV VCA IgM: 0.07 {ISR}

## 2010-10-26 LAB — WOUND CULTURE

## 2010-10-26 LAB — RETICULOCYTES: RBC.: 3.29 MIL/uL — ABNORMAL LOW (ref 3.87–5.11)

## 2010-10-26 LAB — GLUCOSE, CAPILLARY: Comment 1: 110005903

## 2010-10-26 LAB — ABO/RH: ABO/RH(D): A POS

## 2010-10-26 LAB — HEPATITIS C ANTIBODY: HCV Ab: NEGATIVE

## 2010-10-26 LAB — CMV ABS, IGG+IGM (CYTOMEGALOVIRUS): CMV IgM: <8 AU/mL (ref <30.0)

## 2010-12-09 NOTE — H&P (Signed)
Taylor Huynh, Taylor Huynh           ACCOUNT NO.:  [id number]   MEDICAL RECORD NO.:  [id number]          PATIENT TYPE:  INP   LOCATION:  1827                         FACILITY:  [facility]   PHYSICIAN:  Sharlet Salina T. Hoxworth, M.D.DATE OF BIRTH:  15-Nov-1940   DATE OF ADMISSION:  11/28/2004  DATE OF DISCHARGE:                                HISTORY & PHYSICAL   CHIEF COMPLAINT:  Right upper quadrant abdominal pain.   HISTORY OF PRESENT ILLNESS:  Taylor Huynh is a 70 year old female who had a  one-week history of nausea, vomiting, and stomach discomfort.  She saw  primary medical physician and was placed on Prilosec.  Yesterday, however,  she developed right upper quadrant pain that seemed to worsen over the past  24 hours.  She presented to [facility].  A CT of the abdomen  reveals thickened gallbladder wall with a large stone.  We were asked to see  the patient for urgent cholecystectomy.   ALLERGIES:  No known drug allergies.   MEDICATIONS:  Prilosec daily.   PAST MEDICAL HISTORY:  Colonic polyps.   PAST SURGICAL HISTORY:  Total abdominal hysterectomy.   SOCIAL HISTORY:  She lives in [place].  She works as a Holiday representative at  [employer].  She is separated.  She denies any tobacco, alcohol, or illicit  drug use.   FAMILY HISTORY:  Both parents are deceased, and both parents have diabetes  mellitus.   REVIEW OF SYSTEMS:  No chest pain, shortness of breath.  No fevers, chills,  or sweats.  She does have nausea, vomiting, abdominal pain.  All other  systems are negative.   PHYSICAL EXAMINATION:  VITAL SIGNS:  Temperature 99.3, pulse 88,  respirations 18, blood pressure 128/69, O2 saturations 98% on room air.  GENERAL:  She is complaining of mild right upper quadrant discomfort.  HEENT:  Grossly normal.  No masses.  No jugular venous distention or  thyromegaly.  Sclerae clear.  Conjunctivae normal.  Nares without discharge.  HEART:  Regular rate and rhythm.  Normal S1, S2.   No murmurs, rubs or  gallops.  LUNGS:  Clear to auscultation bilaterally without rales, wheeze, or rhonchi.  SKIN:  No rash.  ABDOMEN:  Soft.  She does have right upper quadrant tenderness and a Murphy  sign. No guarding.  No hernias.  No hepatosplenomegaly.  EXTREMITIES:  No clubbing, cyanosis or edema.  MUSCULOSKELETAL:  No joint deformities.  No CVA tenderness.  NEURO:  Cranial nerves II-XII grossly intact.   CT reveals thickened gallbladder wall with large stone present.   Lab studies reveal white count of 6.0, sodium 140, potassium 4.1, BUN 8,  creatinine 0.8, normal LFTs.  Normal lipase. Urinalysis negative.   ASSESSMENT/PLAN:  1.  Acute cholecystitis.  2.  History of colonic polyps.  3.  Status post total abdominal hysterectomy.  4.  Low grade fever.   The patient was seen and examined by Dr. Johna Sheriff who will take the patient  to the operating room today for cholecystectomy.      LB/MEDQ  D:  11/28/2004  T:  11/28/2004  Job:  [id number]

## 2010-12-09 NOTE — Op Note (Signed)
NAMEKEMYRA, AUGUST           ACCOUNT NO.:  [id number]   MEDICAL RECORD NO.:  [id number]          PATIENT TYPE:  INP   LOCATION:  5707                         FACILITY:  [facility]   PHYSICIAN:  Sharlet Salina T. Hoxworth, M.D.DATE OF BIRTH:  19-Mar-1941   DATE OF PROCEDURE:  11/28/2004  DATE OF DISCHARGE:                                 OPERATIVE REPORT   PREOPERATIVE DIAGNOSES:  Cholelithiasis and acute cholecystitis.   POSTOPERATIVE DIAGNOSES:  Cholelithiasis and acute cholecystitis.   SURGICAL PROCEDURE:  Laparoscopic cholecystectomy.   SURGEON:  Lorne Skeens. Hoxworth, M.D.   ASSISTANT:  Gabrielle Dare. Janee Morn, M.D.   ANESTHESIA:  General.   BRIEF HISTORY:  Talor Desrosiers is a 70 year old female who presents with  an almost one-week history of intermittent abdominal pain, nausea, and  vomiting that culminated in progressive more severe epigastric and right  upper quadrant abdominal pain over the past 24 hours. In the emergency room,  a CT scan abdomen has been obtained which shows a markedly thickened  gallbladder wall with inflammatory change and least one large gallstone. The  LFTs were normal. I have recommended proceeding with laparoscopic  cholecystectomy with cholangiogram.  The nature of the procedure,  indications, risks of bleeding, infection, bile leak, bile duct injury, and  cardiorespiratory complications were discussed with the patient  preoperatively.  She is now brought to the operating room for this  procedure.   DESCRIPTION OF OPERATION:  The patient was brought to the operating room and  placed in the supine position on he operating table.  General orotracheal  anesthesia was induced. The abdomen was sterilely prepped and draped.  Abdomen was obese, and I elected to enter above the umbilicus through a  small midline incision.  Dissection was carried down to the midline fascia  which was sharply incised for 1 cm and the peritoneum entered under direct  vision.  Through a mattress suture of 4-0 Vicryl, the Hasson trocar was  placed and pneumoperitoneum established. Under direct vision, a 10-mm trocar  was placed in the subxiphoid area and two 5-mm trocars along the right  subcostal margin. There were inflammatory omental adhesions in the  gallbladder which were carefully stripped away, and the gallbladder was  tense and acutely and subacutely severely inflamed. The gallbladder was  aspirated and clear bile obtained  This allowed the fundus to be grasped and  elevated, and the infundibulum exposed. The infundibulum was also inflamed  but was able to be grasped and  retracted. Using careful blunt dissection,  fibrofatty tissue was stripped down off the infundibulum toward the porta  hepatis. The dissection was difficult but progressed with satisfactory  visualization. I did place an additional 5-mm trocar in the left midabdomen  to retract the hepatoduodenal ligament for better exposure. The infundibulum  was seen to taper down, and the cystic artery was clearly seen coursing up  onto the gallbladder wall, and this was divided between clips for better  exposure. Again, with careful blunt progressive dissection, the cystic  duct/gallbladder junction was identified, and this was dissected 360  degrees, and we were comfortable we were at the cystic duct/  gallbladder  junction. However, the cystic duct was short, and the exposure was not ideal  and, therefore, I felt a cholangiogram would be very difficult. With normal  LFTs, I elected to divide the cystic duct at this point, and it was divided  between two proximal and one distal clip. The gallbladder was then carefully  and very tediously dissected away from the gallbladder bed. There was marked  inflammatory reaction and thickening throughout. However, this progressed  satisfactorily, and the gallbladder was detached. It was placed in an  EndoCatch bag and brought up to the umbilical incision. This was  extended  slightly due to multiple large stones which were extracted along with the  gallbladder. The Hasson trocar was replaced, and the right upper quadrant  was thoroughly irrigated and hemostasis assured. There was no bleeding. A  closed suction drain was left in the Morison's pouch and brought out through  a lateral trocar site. The trocars were removed under direct vision, all CO2  evacuated. The mattress suture was secured at the supraumbilical incision.  Skin incisions were closed with interrupted subcuticular Monocryl and Steri-  Strips.  Sponge, needle, and instrument counts were correct.  Dry sterile  dressings were applied, and the patient was taken to recovery in good  condition.      BTH/MEDQ  D:  11/28/2004  T:  11/28/2004  Job:  [id number]

## 2010-12-09 NOTE — Discharge Summary (Signed)
[name], NINE           ACCOUNT NO.:  [id number]   MEDICAL RECORD NO.:  [id number]          PATIENT TYPE:  INP   LOCATION:  5707                         FACILITY:  [facility]   PHYSICIAN:  Sharlet Salina T. Hoxworth, M.D.DATE OF BIRTH:  [birth year]   DATE OF ADMISSION:  11/28/2004  DATE OF DISCHARGE:  11/30/2004                                 DISCHARGE SUMMARY   DISCHARGE DIAGNOSES:  1.  Acute cholecystitis, status post laparoscopic cholecystectomy on Nov 28, 2004.  2.  History of colonic polyps.  3.  Status post total abdominal hysterectomy in the past.   HOSPITAL COURSE:  [name] is a 70 year old female who had a one-week  history of nausea and vomiting. She initially saw her primary care physician  and she was placed on a reflux medication. On the day prior to admission he  developed right upper quadrant pain. This pain worsened and she presented to  the emergency room. A CT scan revealed a thickened gallbladder wall with  large stone present.   Dr. Johna Sheriff was consulted on Nov 28, 2004, and the patient was taken to the  operating room the same day for laparoscopic cholecystectomy. Initially a  drain was placed, but this was pulled prior to discharge. The patient  gradually increase her diet and on postoperative day #2 was felt to be ready  to be discharged to home. Her vital signs were stable, her abdomen was soft,  and her incisions were healing nicely.   The patient is discharged to home on the following medications: Vicodin  5/500 one to two tablets q.6h. p.r.n. pain.   The patient is instructed not to drive for one week, no lifting for four  weeks. She may return to work on Dec 12, 2004. She works as a Holiday representative at [employer] Her wound care includes cleaning area gently with soap and water, no  scrubbing. She is to call for a temperature greater than 101. She is to  return to Dr. Odie Sera on Dec 15, 2004, at 10:15 a.m. for follow-up  appointment.      LB/MEDQ  D:  11/30/2004  T:  11/30/2004  Job:  [id number]

## 2011-02-10 ENCOUNTER — Other Ambulatory Visit: Payer: Self-pay | Admitting: Oncology

## 2011-02-10 ENCOUNTER — Ambulatory Visit (HOSPITAL_COMMUNITY)
Admission: RE | Admit: 2011-02-10 | Discharge: 2011-02-10 | Disposition: A | Discharge status: Home / Self Care | Payer: Medicare Other | Source: Ambulatory Visit | Attending: Oncology | Admitting: Oncology

## 2011-02-10 ENCOUNTER — Encounter (HOSPITAL_BASED_OUTPATIENT_CLINIC_OR_DEPARTMENT_OTHER): Payer: Medicare Other | Admitting: Oncology

## 2011-02-10 DIAGNOSIS — I10 Essential (primary) hypertension: Secondary | ICD-10-CM | POA: Insufficient documentation

## 2011-02-10 DIAGNOSIS — C8589 Other specified types of non-Hodgkin lymphoma, extranodal and solid organ sites: Secondary | ICD-10-CM

## 2011-02-10 DIAGNOSIS — Z90710 Acquired absence of both cervix and uterus: Secondary | ICD-10-CM | POA: Insufficient documentation

## 2011-02-10 DIAGNOSIS — Z9089 Acquired absence of other organs: Secondary | ICD-10-CM | POA: Insufficient documentation

## 2011-02-10 DIAGNOSIS — K219 Gastro-esophageal reflux disease without esophagitis: Secondary | ICD-10-CM | POA: Insufficient documentation

## 2011-02-10 DIAGNOSIS — K5730 Diverticulosis of large intestine without perforation or abscess without bleeding: Secondary | ICD-10-CM | POA: Insufficient documentation

## 2011-02-10 DIAGNOSIS — C8583 Other specified types of non-Hodgkin lymphoma, intra-abdominal lymph nodes: Secondary | ICD-10-CM

## 2011-02-10 LAB — CBC WITH DIFFERENTIAL/PLATELET
Basophils Absolute: 0 10*3/uL (ref 0.0–0.1)
EOS%: 4.9 % (ref 0.0–7.0)
HCT: 34 % — ABNORMAL LOW (ref 34.8–46.6)
HGB: 11 g/dL — ABNORMAL LOW (ref 11.6–15.9)
LYMPH%: 36.9 % (ref 14.0–49.7)
MCH: 28.4 pg (ref 25.1–34.0)
MONO#: 0.3 10*3/uL (ref 0.1–0.9)
NEUT%: 45.1 % (ref 38.4–76.8)
Platelets: 189 10*3/uL (ref 145–400)
lymph#: 0.9 10*3/uL (ref 0.9–3.3)

## 2011-02-10 LAB — CMP (CANCER CENTER ONLY)
Albumin: 3.4 g/dL (ref 3.3–5.5)
BUN, Bld: 15 mg/dL (ref 7–22)
Calcium: 8.8 mg/dL (ref 8.0–10.3)
Chloride: 102 mEq/L (ref 98–108)
Glucose, Bld: 91 mg/dL (ref 73–118)
Potassium: 4.3 mEq/L (ref 3.3–4.7)

## 2011-02-10 LAB — LACTATE DEHYDROGENASE: LDH: 138 U/L (ref 94–250)

## 2011-02-10 MED ORDER — IOHEXOL 300 MG/ML  SOLN
100.0000 mL | Freq: Once | INTRAMUSCULAR | Status: AC | PRN
  Administered 2011-02-10: 100 mL via INTRAVENOUS

## 2011-02-17 ENCOUNTER — Other Ambulatory Visit: Payer: Self-pay | Admitting: Oncology

## 2011-02-17 ENCOUNTER — Encounter (HOSPITAL_BASED_OUTPATIENT_CLINIC_OR_DEPARTMENT_OTHER): Payer: Medicare Other | Admitting: Oncology

## 2011-02-17 DIAGNOSIS — C8593 Non-Hodgkin lymphoma, unspecified, intra-abdominal lymph nodes: Secondary | ICD-10-CM

## 2011-02-17 DIAGNOSIS — I10 Essential (primary) hypertension: Secondary | ICD-10-CM

## 2011-02-17 DIAGNOSIS — C8589 Other specified types of non-Hodgkin lymphoma, extranodal and solid organ sites: Secondary | ICD-10-CM

## 2011-06-01 ENCOUNTER — Other Ambulatory Visit: Payer: Self-pay | Admitting: *Deleted

## 2011-06-02 ENCOUNTER — Telehealth: Payer: Self-pay | Admitting: Oncology

## 2011-06-02 NOTE — Telephone Encounter (Signed)
Talked to pt gave her appt date for labs/Ct/ MD

## 2011-06-09 ENCOUNTER — Other Ambulatory Visit (HOSPITAL_COMMUNITY): Payer: Medicare Other

## 2011-06-09 ENCOUNTER — Other Ambulatory Visit (HOSPITAL_BASED_OUTPATIENT_CLINIC_OR_DEPARTMENT_OTHER): Payer: Medicare Other | Admitting: Lab

## 2011-06-09 ENCOUNTER — Other Ambulatory Visit: Payer: Self-pay | Admitting: Oncology

## 2011-06-09 ENCOUNTER — Ambulatory Visit (HOSPITAL_COMMUNITY)
Admission: RE | Admit: 2011-06-09 | Discharge: 2011-06-09 | Disposition: A | Discharge status: Home / Self Care | Payer: Medicare Other | Source: Ambulatory Visit | Attending: Oncology | Admitting: Oncology

## 2011-06-09 DIAGNOSIS — C8583 Other specified types of non-Hodgkin lymphoma, intra-abdominal lymph nodes: Secondary | ICD-10-CM | POA: Insufficient documentation

## 2011-06-09 DIAGNOSIS — I517 Cardiomegaly: Secondary | ICD-10-CM | POA: Insufficient documentation

## 2011-06-09 DIAGNOSIS — N289 Disorder of kidney and ureter, unspecified: Secondary | ICD-10-CM | POA: Insufficient documentation

## 2011-06-09 DIAGNOSIS — M47817 Spondylosis without myelopathy or radiculopathy, lumbosacral region: Secondary | ICD-10-CM | POA: Insufficient documentation

## 2011-06-09 DIAGNOSIS — K449 Diaphragmatic hernia without obstruction or gangrene: Secondary | ICD-10-CM | POA: Insufficient documentation

## 2011-06-09 DIAGNOSIS — C8593 Non-Hodgkin lymphoma, unspecified, intra-abdominal lymph nodes: Secondary | ICD-10-CM

## 2011-06-09 DIAGNOSIS — J984 Other disorders of lung: Secondary | ICD-10-CM | POA: Insufficient documentation

## 2011-06-09 DIAGNOSIS — K5730 Diverticulosis of large intestine without perforation or abscess without bleeding: Secondary | ICD-10-CM | POA: Insufficient documentation

## 2011-06-09 LAB — LACTATE DEHYDROGENASE: LDH: 162 U/L (ref 94–250)

## 2011-06-09 LAB — CMP (CANCER CENTER ONLY)
Albumin: 3.7 g/dL (ref 3.3–5.5)
Alkaline Phosphatase: 70 U/L (ref 26–84)
BUN, Bld: 12 mg/dL (ref 7–22)
Creat: 0.6 mg/dl (ref 0.6–1.2)
Glucose, Bld: 86 mg/dL (ref 73–118)
Total Bilirubin: 0.5 mg/dl (ref 0.20–1.60)

## 2011-06-09 LAB — CBC WITH DIFFERENTIAL/PLATELET
Basophils Absolute: 0 10*3/uL (ref 0.0–0.1)
Eosinophils Absolute: 0.1 10*3/uL (ref 0.0–0.5)
HGB: 11.6 g/dL (ref 11.6–15.9)
LYMPH%: 44.9 % (ref 14.0–49.7)
MCV: 89.3 fL (ref 79.5–101.0)
MONO%: 11.3 % (ref 0.0–14.0)
NEUT#: 0.6 10*3/uL — ABNORMAL LOW (ref 1.5–6.5)
NEUT%: 39.7 % (ref 38.4–76.8)
Platelets: 191 10*3/uL (ref 145–400)

## 2011-06-09 MED ORDER — IOHEXOL 300 MG/ML  SOLN
100.0000 mL | Freq: Once | INTRAMUSCULAR | Status: AC | PRN
  Administered 2011-06-09: 100 mL via INTRAVENOUS

## 2011-06-16 ENCOUNTER — Ambulatory Visit (HOSPITAL_BASED_OUTPATIENT_CLINIC_OR_DEPARTMENT_OTHER): Payer: Medicare Other | Admitting: Nurse Practitioner

## 2011-06-16 ENCOUNTER — Telehealth: Payer: Self-pay | Admitting: Oncology

## 2011-06-16 ENCOUNTER — Other Ambulatory Visit: Payer: Self-pay | Admitting: Nurse Practitioner

## 2011-06-16 VITALS — BP 157/80 | HR 72 | Temp 98.6°F | Ht 64.0 in | Wt 171.6 lb

## 2011-06-16 DIAGNOSIS — D72819 Decreased white blood cell count, unspecified: Secondary | ICD-10-CM

## 2011-06-16 DIAGNOSIS — C8583 Other specified types of non-Hodgkin lymphoma, intra-abdominal lymph nodes: Secondary | ICD-10-CM | POA: Insufficient documentation

## 2011-06-16 DIAGNOSIS — K5730 Diverticulosis of large intestine without perforation or abscess without bleeding: Secondary | ICD-10-CM

## 2011-06-16 DIAGNOSIS — C8589 Other specified types of non-Hodgkin lymphoma, extranodal and solid organ sites: Secondary | ICD-10-CM

## 2011-06-16 DIAGNOSIS — D709 Neutropenia, unspecified: Secondary | ICD-10-CM

## 2011-06-16 NOTE — Progress Notes (Signed)
OFFICE PROGRESS NOTE  Interval history:  Taylor Huynh is a 70 year old woman who initially presented in the fall of 2010 with abdominal pain. She was being evaluated for a soft tissue mass in the area of the right colon when she perforated a diverticulum. She underwent emergency surgery with a sigmoid colectomy and colostomy. 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 She completed 4 cycles of CHOP/Rituxan 07/05/2009 through 09/06/2009. Restaging CT abdomen and pelvis on 06/09/2011 was without evidence of residual or recurrent lymphoma. She was noted to have a small hiatal hernia. Chest x-ray also on 06/09/2011 showed no active lung disease and no adenopathy.  Taylor Huynh reports that she feels well. She denies fevers or sweats. She has a good appetite. She denies weight loss. She denies pain. Bowels moving regularly. She denies constipation or diarrhea. No hematochezia or melena. No interim illnesses or infections. No shortness of breath or cough.  Objective: Blood pressure 157/80, pulse 72, temperature 98.6 F (37 C), temperature source Oral, height 5\' 4"  (1.626 m), weight 171 lb 9.6 oz (77.837 kg).  Oropharynx is without thrush or ulceration. No palpable cervical, supraclavicular, axillary or inguinal lymph nodes. Lungs are clear. No wheezes or rales. Regular cardiac rhythm. Abdomen is soft and nontender. No organomegaly. No mass. Extremities are without edema. Calves are soft and nontender.   Lab Results: Labs done 06/09/2011 hemoglobin 11.6 white count 1.6 absolute neutrophil count 0.6 platelet count 191,000; sodium 143 potassium 3.9 BUN 12 creatinine 0.6; LDH 162 sedimentation rate 6.     Studies/Results: No results found.  Medications: I have reviewed the patient's current medications.  Assessment/Plan:  1.  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  Restaging CT scans abdomen/pelvis 06/09/2011 with no evidence of recurrent disease. 2.  Diverticular disease with history of perforation treated with sigmoid colectomy and temporary colostomy. 3.  Leukopenia-initially noted February 2011. Stable 02/10/2010 through 02/10/2011 at which time the total white count was 2.4 with an absolute neutrophil count of 1.1. The white count was further declined on 06/09/2011 at 1.6 with an absolute neutrophil count of 0.6. She denies any recent infections. 4.  Hiatal hernia with reflux-she continues Prilosec.  Disposition-Taylor Huynh appears stable. She remains in remission from the non-Hodgkin's lymphoma. There is progressive leukopenia/neutropenia on labs done 06/09/2011. We will have her return for a repeat CBC in one month. She will return for a followup visit with Dr. Cyndie Chime in 6 months with a restaging CT evaluation and chest x-ray one week prior to the appointment. She will contact the office in the interim with any problems.  Plan reviewed with Dr. Cyndie Chime. Lonna Cobb ANP/GNP-BC

## 2011-06-16 NOTE — Telephone Encounter (Signed)
gve the pt her may 2013 appt calendar along with the ct scan appt/cxr

## 2011-06-20 ENCOUNTER — Encounter: Payer: Self-pay | Admitting: *Deleted

## 2011-06-20 NOTE — Progress Notes (Signed)
Labs done 06/09/11 sent electronically to Dr. Vianne Bulls per Dr. Patsy Lager request.

## 2011-07-14 ENCOUNTER — Other Ambulatory Visit: Payer: Medicare Other

## 2011-07-14 ENCOUNTER — Other Ambulatory Visit (HOSPITAL_BASED_OUTPATIENT_CLINIC_OR_DEPARTMENT_OTHER): Payer: Medicare Other

## 2011-07-14 DIAGNOSIS — C8583 Other specified types of non-Hodgkin lymphoma, intra-abdominal lymph nodes: Secondary | ICD-10-CM

## 2011-07-14 DIAGNOSIS — C8589 Other specified types of non-Hodgkin lymphoma, extranodal and solid organ sites: Secondary | ICD-10-CM

## 2011-07-14 LAB — CBC WITH DIFFERENTIAL/PLATELET
Basophils Absolute: 0 10*3/uL (ref 0.0–0.1)
EOS%: 3 % (ref 0.0–7.0)
Eosinophils Absolute: 0.1 10*3/uL (ref 0.0–0.5)
HCT: 35.1 % (ref 34.8–46.6)
HGB: 11.7 g/dL (ref 11.6–15.9)
MONO#: 0.3 10*3/uL (ref 0.1–0.9)
NEUT#: 1.1 10*3/uL — ABNORMAL LOW (ref 1.5–6.5)
NEUT%: 46.1 % (ref 38.4–76.8)
RDW: 13.8 % (ref 11.2–14.5)
WBC: 2.4 10*3/uL — ABNORMAL LOW (ref 3.9–10.3)
lymph#: 0.9 10*3/uL (ref 0.9–3.3)

## 2011-07-19 ENCOUNTER — Telehealth: Payer: Self-pay | Admitting: *Deleted

## 2011-07-19 NOTE — Telephone Encounter (Signed)
Received call from pt. asking for results of recent labs.  She reports that she has had some passing out spells.  Reported that this doesn't look like a result of her hgb.  Encouraged to get checked out with her PCP.

## 2011-07-21 ENCOUNTER — Other Ambulatory Visit: Payer: Self-pay | Admitting: Family Medicine

## 2011-07-21 ENCOUNTER — Ambulatory Visit: Payer: Self-pay

## 2011-07-21 DIAGNOSIS — R4182 Altered mental status, unspecified: Secondary | ICD-10-CM

## 2011-07-26 ENCOUNTER — Ambulatory Visit
Admission: RE | Admit: 2011-07-26 | Discharge: 2011-07-26 | Disposition: A | Discharge status: Home / Self Care | Payer: Medicare Other | Source: Ambulatory Visit | Attending: Family Medicine | Admitting: Family Medicine

## 2011-07-26 DIAGNOSIS — R4182 Altered mental status, unspecified: Secondary | ICD-10-CM

## 2011-07-26 MED ORDER — GADOBENATE DIMEGLUMINE 529 MG/ML IV SOLN
15.0000 mL | Freq: Once | INTRAVENOUS | Status: AC | PRN
  Administered 2011-07-26: 15 mL via INTRAVENOUS

## 2011-12-05 ENCOUNTER — Ambulatory Visit (HOSPITAL_COMMUNITY)
Admission: RE | Admit: 2011-12-05 | Discharge: 2011-12-05 | Disposition: A | Discharge status: Home / Self Care | Payer: Medicare Other | Source: Ambulatory Visit | Attending: Nurse Practitioner | Admitting: Nurse Practitioner

## 2011-12-05 ENCOUNTER — Other Ambulatory Visit (HOSPITAL_BASED_OUTPATIENT_CLINIC_OR_DEPARTMENT_OTHER): Payer: Medicare Other | Admitting: Lab

## 2011-12-05 DIAGNOSIS — C8583 Other specified types of non-Hodgkin lymphoma, intra-abdominal lymph nodes: Secondary | ICD-10-CM

## 2011-12-05 DIAGNOSIS — C8589 Other specified types of non-Hodgkin lymphoma, extranodal and solid organ sites: Secondary | ICD-10-CM | POA: Insufficient documentation

## 2011-12-05 DIAGNOSIS — Z90710 Acquired absence of both cervix and uterus: Secondary | ICD-10-CM | POA: Insufficient documentation

## 2011-12-05 DIAGNOSIS — Z9089 Acquired absence of other organs: Secondary | ICD-10-CM | POA: Insufficient documentation

## 2011-12-05 DIAGNOSIS — N281 Cyst of kidney, acquired: Secondary | ICD-10-CM | POA: Insufficient documentation

## 2011-12-05 LAB — CBC WITH DIFFERENTIAL/PLATELET
Basophils Absolute: 0 10*3/uL (ref 0.0–0.1)
Eosinophils Absolute: 0.1 10*3/uL (ref 0.0–0.5)
HCT: 36.5 % (ref 34.8–46.6)
HGB: 11.9 g/dL (ref 11.6–15.9)
MCH: 29.3 pg (ref 25.1–34.0)
MCV: 89.7 fL (ref 79.5–101.0)
MONO%: 11.8 % (ref 0.0–14.0)
NEUT#: 0.8 10*3/uL — ABNORMAL LOW (ref 1.5–6.5)
NEUT%: 39.8 % (ref 38.4–76.8)
Platelets: 205 10*3/uL (ref 145–400)
RDW: 14.1 % (ref 11.2–14.5)

## 2011-12-05 LAB — CMP (CANCER CENTER ONLY)
ALT(SGPT): 25 U/L (ref 10–47)
Albumin: 3.7 g/dL (ref 3.3–5.5)
CO2: 26 mEq/L (ref 18–33)
Calcium: 8.7 mg/dL (ref 8.0–10.3)
Chloride: 102 mEq/L (ref 98–108)
Glucose, Bld: 94 mg/dL (ref 73–118)
Sodium: 143 mEq/L (ref 128–145)
Total Protein: 7.3 g/dL (ref 6.4–8.1)

## 2011-12-05 LAB — LACTATE DEHYDROGENASE: LDH: 183 U/L (ref 94–250)

## 2011-12-05 MED ORDER — IOHEXOL 300 MG/ML  SOLN
100.0000 mL | Freq: Once | INTRAMUSCULAR | Status: AC | PRN
  Administered 2011-12-05: 100 mL via INTRAVENOUS

## 2011-12-11 ENCOUNTER — Telehealth: Payer: Self-pay

## 2011-12-11 NOTE — Telephone Encounter (Signed)
Message copied by Albertha Ghee on Mon Dec 11, 2011  4:20 PM ------      Message from: Levert Feinstein      Created: Mon Dec 11, 2011  2:37 PM       Call - CT & CXR good

## 2011-12-11 NOTE — Telephone Encounter (Signed)
Pt notified of CXR/CT results per Dr Cyndie Chime note. dph

## 2011-12-12 ENCOUNTER — Encounter: Payer: Self-pay | Admitting: Oncology

## 2011-12-12 ENCOUNTER — Ambulatory Visit (HOSPITAL_BASED_OUTPATIENT_CLINIC_OR_DEPARTMENT_OTHER): Payer: Medicare Other | Admitting: Oncology

## 2011-12-12 VITALS — BP 144/79 | HR 71 | Temp 97.3°F | Ht 64.0 in | Wt 177.4 lb

## 2011-12-12 DIAGNOSIS — D72819 Decreased white blood cell count, unspecified: Secondary | ICD-10-CM

## 2011-12-12 DIAGNOSIS — Z8719 Personal history of other diseases of the digestive system: Secondary | ICD-10-CM

## 2011-12-12 DIAGNOSIS — K219 Gastro-esophageal reflux disease without esophagitis: Secondary | ICD-10-CM

## 2011-12-12 DIAGNOSIS — R55 Syncope and collapse: Secondary | ICD-10-CM

## 2011-12-12 DIAGNOSIS — C8583 Other specified types of non-Hodgkin lymphoma, intra-abdominal lymph nodes: Secondary | ICD-10-CM

## 2011-12-12 DIAGNOSIS — K449 Diaphragmatic hernia without obstruction or gangrene: Secondary | ICD-10-CM

## 2011-12-12 DIAGNOSIS — C8590 Non-Hodgkin lymphoma, unspecified, unspecified site: Secondary | ICD-10-CM

## 2011-12-12 HISTORY — DX: Diaphragmatic hernia without obstruction or gangrene: K44.9

## 2011-12-12 HISTORY — DX: Decreased white blood cell count, unspecified: D72.819

## 2011-12-12 HISTORY — DX: Syncope and collapse: R55

## 2011-12-12 NOTE — Progress Notes (Signed)
Hematology and Oncology Follow Up Visit  Taylor Huynh [id number] 1940/08/02 71 y.o. 12/12/2011 1:14 PM   Principle Diagnosis: Encounter Diagnoses  Name Primary?  . Non Hodgkin's lymphoma Yes  . Hx of diverticulitis of colon   . Other malignant lymphomas of intra-abdominal lymph nodes   . Leukopenia      Interim History:   Followup visit for this pleasant 71 year old woman diagnosed with a high-grade B-cell non-Hodgkin's lymphoma in November 2010. She was under evaluation for a flare up of diverticulitis and was found to have a soft tissue mass in the area of the terminal ileum. While preparations were being made for elective surgery, she perforated diverticulum and had to have emergency exploratory surgery. A temporary colostomy was done. The mass in the terminal ileum was resected. Staging evaluation did not show any additional disease outside the abdomen. She was treated with a combination of CHOP Rituxan for 4 cycles between 07/05/2009 in 09/06/2009. She has been followed clinically and radiographically since that time with no evidence for recurrent disease including the study done in anticipation of today's visit done on 12/05/2011 which I personally reviewed.  She has a history of seizures when she was age 71. She had not been on medication or have any additional seizures for many years. She reports to me today a syncopal episode occurring in June of last year and then to syncopal episodes occurring in December. She states initial evaluation by her primary care physician. She was found to be B12 deficient and put on replacement. She had a urinary tract infection which was treated. She was referred for a neurology evaluation. An MRI of the brain was done 07/26/2011. There was mild white matter disease otherwise the study was normal. She believes she did have an EEG but there is no record of this in Epic. She was not put back on medication. She has not had any other evidence for the last 5  months.  She denies any abdominal pain. No hematochezia or melena. Appetite is good. Weight is stable.  Medications: reviewed  Allergies: No Known Allergies  Review of Systems: Constitutional:  No constitutional symptoms  Respiratory: No cough or dyspnea Cardiovascular:  She denies any chest pain or palpitations. No cardiac symptoms at time of her syncopal episodes. He sat been without any warning. They were witnessed on 2 occasions and nobody reported any obvious seizure activity. Gastrointestinal: See above Genito-Urinary: See above no current urinary tract symptoms Musculoskeletal: No muscle or bone pain Neurologic: See above Skin: No rash Remaining ROS negative.  Physical Exam: Blood pressure 144/79, pulse 71, temperature 97.3 F (36.3 C), temperature source Oral, height 5\' 4"  (1.626 m), weight 177 lb 6.4 oz (80.468 kg). Wt Readings from Last 3 Encounters:  12/12/11 177 lb 6.4 oz (80.468 kg)  06/16/11 171 lb 9.6 oz (77.837 kg)  05/12/08 201 lb 2.1 oz (91.233 kg)     General appearance: Well-nourished African American woman HENNT: Pharynx no erythema or exudate Lymph nodes: No cervical, supraclavicular, axillary, or inguinal adenopathy Breasts: Not examined Lungs: Clear to auscultation resonant to percussion Heart: Regular rhythm no murmur gallop or rub Abdomen: Soft nontender, surgical scars, no mass no organomegaly. Extremities: No edema no calf tenderness Vascular: No cyanosis Neurologic: Mental status intact, cranial nerves grossly normal, pupils equal round reactive to light, motor strength 5 over 5, reflexes 1+ symmetric, upper body coordination normal, gait normal, Skin: No rash or ecchymosis  Lab Results: Lab Results  Component Value Date   WBC 1.9*  12/05/2011   HGB 11.9 12/05/2011   HCT 36.5 12/05/2011   MCV 89.7 12/05/2011   PLT 205 12/05/2011     Chemistry      Component Value Date/Time   NA 143 12/05/2011 0900   NA 141 06/21/2010 0842   K 4.1 12/05/2011  0900   K 3.6 06/21/2010 0842   CL 102 12/05/2011 0900   CL 106 06/21/2010 0842   CO2 26 12/05/2011 0900   CO2 28 06/21/2010 0842   BUN 13 12/05/2011 0900   BUN 14 06/21/2010 0842   CREATININE 0.9 12/05/2011 0900   CREATININE 0.78 06/21/2010 0842      Component Value Date/Time   CALCIUM 8.7 12/05/2011 0900   CALCIUM 9.1 06/21/2010 0842   ALKPHOS 78 12/05/2011 0900   ALKPHOS 81 06/21/2010 0842   AST 20 12/05/2011 0900   AST 22 06/21/2010 0842   ALT 27 06/21/2010 0842   BILITOT 0.70 12/05/2011 0900   BILITOT 0.4 06/21/2010 1308       Radiological Studies: See discussion above   Impression and Plan:  1. 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  no evidence of recurrent disease now out 2-1/2 years. Plan: I'm going to decrease frequency of exam and CT scans to one year. 2. Diverticular disease with history of perforation treated with sigmoid colectomy and temporary colostomy.  3. Leukopenia-initially noted February 2011. Bone marrow biopsy done as part of her lymphoma staging evaluation in November 2010 did not show any evidence for lymphoma or other dysplastic changes. This may be an ethnic variation of normal. 4. Hiatal hernia with reflux-she continues Prilosec    CC:Liz Malady, MD 5/21/20131:14 PM

## 2013-05-05 ENCOUNTER — Other Ambulatory Visit: Payer: Self-pay | Admitting: Family Medicine

## 2013-05-05 DIAGNOSIS — E2839 Other primary ovarian failure: Secondary | ICD-10-CM

## 2013-05-05 DIAGNOSIS — Z1231 Encounter for screening mammogram for malignant neoplasm of breast: Secondary | ICD-10-CM

## 2013-05-07 ENCOUNTER — Telehealth: Payer: Self-pay | Admitting: Oncology

## 2013-05-07 ENCOUNTER — Telehealth: Payer: Self-pay | Admitting: *Deleted

## 2013-05-07 NOTE — Telephone Encounter (Signed)
Received message from Jill--Dr. Caryn Bee Little's office requesting recommendation from Dr. Cyndie Chime of when pt should schedule f/u colonoscopy; and when she should schedule f/u with him.  Note to Dr. Cyndie Chime.

## 2013-05-07 NOTE — Telephone Encounter (Signed)
REFERRAL RECEIVED FROM DR. LISA MILLER. PT IS ESTABLISHED OF DR. GRANFORTUNA FORWARD TO DESK NURSE FOR AN APPT.

## 2013-05-08 ENCOUNTER — Other Ambulatory Visit: Payer: Self-pay | Admitting: Oncology

## 2013-05-08 ENCOUNTER — Telehealth: Payer: Self-pay | Admitting: Oncology

## 2013-05-08 DIAGNOSIS — C8583 Other specified types of non-Hodgkin lymphoma, intra-abdominal lymph nodes: Secondary | ICD-10-CM

## 2013-05-08 DIAGNOSIS — D72819 Decreased white blood cell count, unspecified: Secondary | ICD-10-CM

## 2013-05-08 NOTE — Telephone Encounter (Signed)
Spoke with [facility] @ Dr. Fredirick Maudlin office per Dr. Cyndie Chime --pt can have colonoscopy done at any time.  Our office scheduler will contact pt regarding appt with MD here.  Taylor Huynh verbalized understanding and expressed appreciation for call back.

## 2013-05-08 NOTE — Telephone Encounter (Signed)
Pt called wanting an appt with MD  and also a CT for yearly appt, left message with Amy Horton regarding ptr rqst, waiting for an order

## 2013-05-08 NOTE — Telephone Encounter (Signed)
s.w. pt and advised on lab before ct on 10.22.14 and est on 10.27.14.Marland KitchenMarland Kitchenpt ok and aware

## 2013-05-14 ENCOUNTER — Ambulatory Visit (HOSPITAL_COMMUNITY)
Admission: RE | Admit: 2013-05-14 | Discharge: 2013-05-14 | Disposition: A | Discharge status: Home / Self Care | Payer: Medicare Other | Source: Ambulatory Visit | Attending: Oncology | Admitting: Oncology

## 2013-05-14 ENCOUNTER — Encounter (HOSPITAL_COMMUNITY): Payer: Self-pay

## 2013-05-14 ENCOUNTER — Other Ambulatory Visit (HOSPITAL_BASED_OUTPATIENT_CLINIC_OR_DEPARTMENT_OTHER): Payer: Medicare Other | Admitting: Lab

## 2013-05-14 ENCOUNTER — Other Ambulatory Visit: Payer: Self-pay | Admitting: Oncology

## 2013-05-14 DIAGNOSIS — N281 Cyst of kidney, acquired: Secondary | ICD-10-CM | POA: Insufficient documentation

## 2013-05-14 DIAGNOSIS — K449 Diaphragmatic hernia without obstruction or gangrene: Secondary | ICD-10-CM | POA: Insufficient documentation

## 2013-05-14 DIAGNOSIS — C8583 Other specified types of non-Hodgkin lymphoma, intra-abdominal lymph nodes: Secondary | ICD-10-CM

## 2013-05-14 DIAGNOSIS — D72819 Decreased white blood cell count, unspecified: Secondary | ICD-10-CM

## 2013-05-14 DIAGNOSIS — N289 Disorder of kidney and ureter, unspecified: Secondary | ICD-10-CM | POA: Insufficient documentation

## 2013-05-14 DIAGNOSIS — Z9089 Acquired absence of other organs: Secondary | ICD-10-CM | POA: Insufficient documentation

## 2013-05-14 DIAGNOSIS — C8589 Other specified types of non-Hodgkin lymphoma, extranodal and solid organ sites: Secondary | ICD-10-CM | POA: Insufficient documentation

## 2013-05-14 HISTORY — DX: Essential (primary) hypertension: I10

## 2013-05-14 LAB — COMPREHENSIVE METABOLIC PANEL (CC13)
ALT: 18 U/L (ref 0–55)
AST: 15 U/L (ref 5–34)
Albumin: 3.9 g/dL (ref 3.5–5.0)
Anion Gap: 9 mEq/L (ref 3–11)
BUN: 12.9 mg/dL (ref 7.0–26.0)
CO2: 26 mEq/L (ref 22–29)
Calcium: 9.6 mg/dL (ref 8.4–10.4)
Chloride: 106 mEq/L (ref 98–109)
Potassium: 4.2 mEq/L (ref 3.5–5.1)

## 2013-05-14 LAB — CBC WITH DIFFERENTIAL/PLATELET
BASO%: 1.1 % (ref 0.0–2.0)
Basophils Absolute: 0 10*3/uL (ref 0.0–0.1)
Eosinophils Absolute: 0.2 10*3/uL (ref 0.0–0.5)
HCT: 37.3 % (ref 34.8–46.6)
HGB: 12 g/dL (ref 11.6–15.9)
LYMPH%: 33.2 % (ref 14.0–49.7)
MCHC: 32.2 g/dL (ref 31.5–36.0)
MONO#: 0.4 10*3/uL (ref 0.1–0.9)
NEUT#: 1.4 10*3/uL — ABNORMAL LOW (ref 1.5–6.5)
NEUT%: 45.5 % (ref 38.4–76.8)
Platelets: 247 10*3/uL (ref 145–400)
RDW: 14.1 % (ref 11.2–14.5)
WBC: 3.1 10*3/uL — ABNORMAL LOW (ref 3.9–10.3)
lymph#: 1 10*3/uL (ref 0.9–3.3)

## 2013-05-14 LAB — LACTATE DEHYDROGENASE (CC13): LDH: 197 U/L (ref 125–245)

## 2013-05-14 MED ORDER — IOHEXOL 300 MG/ML  SOLN
100.0000 mL | Freq: Once | INTRAMUSCULAR | Status: AC | PRN
  Administered 2013-05-14: 100 mL via INTRAVENOUS

## 2013-05-15 ENCOUNTER — Telehealth: Payer: Self-pay | Admitting: *Deleted

## 2013-05-15 NOTE — Telephone Encounter (Signed)
Per Dr. Cyndie Chime; notified pt that CT showed no recurrence of lymphoma. Pt verbalized understanding and confirmed appt Monday 05/19/13.

## 2013-05-19 ENCOUNTER — Telehealth: Payer: Self-pay | Admitting: Oncology

## 2013-05-19 ENCOUNTER — Ambulatory Visit (HOSPITAL_BASED_OUTPATIENT_CLINIC_OR_DEPARTMENT_OTHER): Payer: Medicare Other | Admitting: Nurse Practitioner

## 2013-05-19 VITALS — BP 147/71 | HR 81 | Temp 97.8°F | Resp 18 | Ht 64.0 in | Wt 180.9 lb

## 2013-05-19 DIAGNOSIS — K449 Diaphragmatic hernia without obstruction or gangrene: Secondary | ICD-10-CM

## 2013-05-19 DIAGNOSIS — C8583 Other specified types of non-Hodgkin lymphoma, intra-abdominal lymph nodes: Secondary | ICD-10-CM

## 2013-05-19 DIAGNOSIS — K219 Gastro-esophageal reflux disease without esophagitis: Secondary | ICD-10-CM

## 2013-05-19 NOTE — Progress Notes (Signed)
OFFICE PROGRESS NOTE  Interval history:   Taylor Huynh is a 72 year old woman who initially presented in the fall of 2010 with abdominal pain. She was being evaluated for a soft tissue mass in the area of the terminal ileum when she perforated a diverticulum. She underwent emergency surgery with a sigmoid colectomy and colostomy. 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 She completed 4 cycles of CHOP/Rituxan 07/05/2009 through 09/06/2009. She has been followed clinically and radiographically since that time with no evidence for recurrent disease.  Restaging CT abdomen/pelvis on 05/14/2013 showed an increase in the size of 2 small lymph nodes in the lower retroperitoneum of the abdomen. An aortocaval lymph node along the distal aorta was increased to 8 mm from 5 mm previously. A lymph node immediately anterior to the aortic bifurcation measured 6 mm on the current study compared to 4 mm on the prior study. The radiologist commented that the increase in the size of the 2 small lymph nodes was the only identifiable change on the study.  She is seen today for scheduled followup.  No interim illnesses or infections. She feels well. No fevers or sweats. She reports a good appetite. No weight loss. She recently got the flu vaccine. No nausea or vomiting. She tends to have bowel movements after eating. This has been occurring since the abdominal surgery in 2010. No hematuria or dysuria. No pain except "arthritis" in the knees.  Objective: Blood pressure 147/71, pulse 81, temperature 97.8 F (36.6 C), temperature source Oral, resp. rate 18, height 5\' 4"  (1.626 m), weight 180 lb 14.4 oz (82.056 kg).  Oropharynx is without thrush or ulceration. No palpable cervical, supraclavicular, axillary or inguinal lymph nodes. Lungs are clear. No wheezes or rales. Regular cardiac rhythm. No murmur. Abdomen is soft and nontender. No mass. No organomegaly.  Extremities are without edema. Calves soft and nontender. No skin rash. Alert and oriented. Gait normal.  Lab Results: Lab Results  Component Value Date   WBC 3.1* 05/14/2013   HGB 12.0 05/14/2013   HCT 37.3 05/14/2013   MCV 89.0 05/14/2013   PLT 247 05/14/2013    Chemistry:    Chemistry      Component Value Date/Time   NA 141 05/14/2013 0930   NA 143 12/05/2011 0900   NA 141 06/21/2010 0842   K 4.2 05/14/2013 0930   K 4.1 12/05/2011 0900   K 3.6 06/21/2010 0842   CL 102 12/05/2011 0900   CL 106 06/21/2010 0842   CO2 26 05/14/2013 0930   CO2 26 12/05/2011 0900   CO2 28 06/21/2010 0842   BUN 12.9 05/14/2013 0930   BUN 13 12/05/2011 0900   BUN 14 06/21/2010 0842   CREATININE 0.8 05/14/2013 0930   CREATININE 0.9 12/05/2011 0900   CREATININE 0.78 06/21/2010 0842      Component Value Date/Time   CALCIUM 9.6 05/14/2013 0930   CALCIUM 8.7 12/05/2011 0900   CALCIUM 9.1 06/21/2010 0842   ALKPHOS 76 05/14/2013 0930   ALKPHOS 78 12/05/2011 0900   ALKPHOS 81 06/21/2010 0842   AST 15 05/14/2013 0930   AST 20 12/05/2011 0900   AST 22 06/21/2010 0842   ALT 18 05/14/2013 0930   ALT 25 12/05/2011 0900   ALT 27 06/21/2010 0842   BILITOT 0.37 05/14/2013 0930   BILITOT 0.70 12/05/2011 0900   BILITOT 0.4 06/21/2010 1610       Studies/Results: Ct Abdomen Pelvis W  Contrast  05/14/2013   CLINICAL DATA:  High-grade B-cell non-Hodgkin's lymphoma of the terminal ileum.  EXAM: CT ABDOMEN AND PELVIS WITH CONTRAST  TECHNIQUE: Multidetector CT imaging of the abdomen and pelvis was performed using the standard protocol following bolus administration of intravenous contrast.  CONTRAST:  OMNIPAQUE IOHEXOL 300 MG/ML  SOLN  COMPARISON:  12/05/2011  FINDINGS: No focal abnormality is seen in the liver or spleen. There is a small hiatal hernia. The stomach, duodenum, a and adrenal glands are unremarkable. Pancreas is diffusely fatty infiltrated. Mild prominence of the extrahepatic biliary duct in this  patient status post cholecystectomy is without substantial interval change. No associated pancreatic ductal dilatation.  2.2 cm exophytic cyst in the posterior left kidney is unchanged tiny cortical lesions in each kidney are too small to characterize but likely represent tiny cortical cyst.  Numerous tiny retroperitoneal and para-aortic lymph nodes are again identified. The there is an aortocaval lymph node along the distal aorta (see image 43 of series 2) which is increased to 8 mm in diameter from 5 mm previously (see image 42 of series 2 on the previous study). 6 mm short axis lymph node immediately anterior to the aortic bifurcation on image 44 was 4 mm in the same dimension previously. There is no lymphadenopathy in the gastrohepatic or hepatoduodenal ligaments.  Imaging through the pelvis shows no free intraperitoneal fluid. There is no pelvic sidewall lymphadenopathy. Bladder is unremarkable. Uterus is surgically absent. There is no adnexal mass.  Diverticular changes are seen in the left colon without diverticulitis. Patient is status post CK ectomy. The ileocolic anastomosis is patent. No abnormal soft tissue attenuation or lymphadenopathy in the right lower quadrant.  Bone windows reveal no worrisome lytic or sclerotic osseous lesions.  IMPRESSION: The only identifiable change on today's study is an increase in size of 2 small lymph nodes in the lower retroperitoneum of the abdomen. Although both lymph nodes are now at upper limits of normal for size, given the interval increase, close attention to these lymph nodes on followup imaging is recommended.  2.2 cm left renal cyst with other scattered tiny low-density cortical lesions in each kidney which are too small to characterize.   Electronically Signed   By: Kennith Center M.D.   On: 05/14/2013 12:48    Medications: I have reviewed the patient's current medications.  Assessment/Plan:  1. Extranodal high-grade B-cell non-Hodgkin's lymphoma involving  the terminal ileum status post gross tumor resection November 2010 followed by 4 cycles of CHOP/Rituxan 07/05/2009 through 09/06/2009. 2. Diverticular disease with history of perforation treated with sigmoid colectomy and temporary colostomy. 3. Leukopenia initially noted February 2011. Bone marrow biopsy done as part of the lymphoma staging evaluation November 2010 did not show any evidence for lymphoma or other dysplastic changes. Question ethnic variation of normal. 4. Hiatal hernia with reflux. She continues Prilosec.  Disposition-Ms. Wynn appears stable. The recent restaging CT evaluation showed an increase in the size of 2 small lymph nodes in the lower retroperitoneum. Dr. Cyndie Chime recommends a CT scan at a four-month interval to reevaluate.  She will return for a followup visit one week after the CT scan to review the results. She understands to contact the office in the interim with any problems.  Plan reviewed with Dr. Cyndie Chime.  Lonna Cobb ANP/GNP-BC    CC Dr. Renaye Rakers.

## 2013-05-19 NOTE — Telephone Encounter (Signed)
Gave pt appt for lab and Md  for April, gave her orall contrast

## 2013-05-23 ENCOUNTER — Ambulatory Visit
Admission: RE | Admit: 2013-05-23 | Discharge: 2013-05-23 | Disposition: A | Discharge status: Home / Self Care | Payer: Medicare Other | Source: Ambulatory Visit | Attending: Family Medicine | Admitting: Family Medicine

## 2013-05-23 DIAGNOSIS — Z1231 Encounter for screening mammogram for malignant neoplasm of breast: Secondary | ICD-10-CM

## 2013-05-23 DIAGNOSIS — E2839 Other primary ovarian failure: Secondary | ICD-10-CM

## 2013-09-12 ENCOUNTER — Telehealth: Payer: Self-pay | Admitting: Oncology

## 2013-09-12 ENCOUNTER — Telehealth: Payer: Self-pay | Admitting: *Deleted

## 2013-09-12 NOTE — Telephone Encounter (Signed)
Talked to pt and gave her appt for lab and CT for 3/2 and MD on 3/7

## 2013-09-12 NOTE — Telephone Encounter (Addendum)
Received call this am from Rose/Scheduler stating pt called to cancel her CT & lab appt for 09/19/13 & didn't give any reason.  Called pt & she states that she has a commitment on 09/19/13 & needs to be available-something about her insurance company.  Asked if we could r/s & she reports that she wants to wait till Oct since she is feeling well.  Informed that we would discuss with Dr Beryle Beams.  Note to Dr Beryle Beams.  Discussed with Dr Beryle Beams & he does not want pt to wait until Oct.  Talked with pt & informed of this & she is willing to r/s lab & CT.  Transferred to Rose/Scheduler to get this done.

## 2013-09-19 ENCOUNTER — Ambulatory Visit (HOSPITAL_COMMUNITY): Payer: Medicare Other

## 2013-09-19 ENCOUNTER — Ambulatory Visit: Payer: Medicare Other | Admitting: Oncology

## 2013-09-19 ENCOUNTER — Other Ambulatory Visit: Payer: Medicare Other

## 2013-09-20 ENCOUNTER — Encounter: Payer: Self-pay | Admitting: *Deleted

## 2013-09-20 ENCOUNTER — Telehealth: Payer: Self-pay | Admitting: *Deleted

## 2013-09-20 NOTE — Telephone Encounter (Signed)
Former pt of DR. G...td  

## 2013-09-22 ENCOUNTER — Other Ambulatory Visit (HOSPITAL_BASED_OUTPATIENT_CLINIC_OR_DEPARTMENT_OTHER): Payer: Medicare Other

## 2013-09-22 ENCOUNTER — Encounter (HOSPITAL_COMMUNITY): Payer: Self-pay

## 2013-09-22 ENCOUNTER — Ambulatory Visit (HOSPITAL_COMMUNITY)
Admission: RE | Admit: 2013-09-22 | Discharge: 2013-09-22 | Disposition: A | Discharge status: Home / Self Care | Payer: Medicare Other | Source: Ambulatory Visit | Attending: Nurse Practitioner | Admitting: Nurse Practitioner

## 2013-09-22 DIAGNOSIS — C8589 Other specified types of non-Hodgkin lymphoma, extranodal and solid organ sites: Secondary | ICD-10-CM | POA: Insufficient documentation

## 2013-09-22 DIAGNOSIS — C8583 Other specified types of non-Hodgkin lymphoma, intra-abdominal lymph nodes: Secondary | ICD-10-CM

## 2013-09-22 HISTORY — DX: Malignant (primary) neoplasm, unspecified: C80.1

## 2013-09-22 LAB — COMPREHENSIVE METABOLIC PANEL (CC13)
ALBUMIN: 4.3 g/dL (ref 3.5–5.0)
ALK PHOS: 85 U/L (ref 40–150)
ALT: 22 U/L (ref 0–55)
AST: 20 U/L (ref 5–34)
Anion Gap: 7 mEq/L (ref 3–11)
BUN: 13.5 mg/dL (ref 7.0–26.0)
CO2: 27 meq/L (ref 22–29)
Calcium: 9.5 mg/dL (ref 8.4–10.4)
Chloride: 107 mEq/L (ref 98–109)
Creatinine: 0.8 mg/dL (ref 0.6–1.1)
Glucose: 91 mg/dl (ref 70–140)
POTASSIUM: 3.5 meq/L (ref 3.5–5.1)
SODIUM: 140 meq/L (ref 136–145)
TOTAL PROTEIN: 7.6 g/dL (ref 6.4–8.3)
Total Bilirubin: 0.4 mg/dL (ref 0.20–1.20)

## 2013-09-22 LAB — CBC WITH DIFFERENTIAL/PLATELET
BASO%: 0.4 % (ref 0.0–2.0)
Basophils Absolute: 0 10*3/uL (ref 0.0–0.1)
EOS%: 4.7 % (ref 0.0–7.0)
Eosinophils Absolute: 0.1 10*3/uL (ref 0.0–0.5)
HCT: 37.3 % (ref 34.8–46.6)
HGB: 12.1 g/dL (ref 11.6–15.9)
LYMPH%: 44.5 % (ref 14.0–49.7)
MCH: 28.5 pg (ref 25.1–34.0)
MCHC: 32.4 g/dL (ref 31.5–36.0)
MCV: 88 fL (ref 79.5–101.0)
MONO#: 0.3 10*3/uL (ref 0.1–0.9)
MONO%: 9.4 % (ref 0.0–14.0)
NEUT#: 1.3 10*3/uL — ABNORMAL LOW (ref 1.5–6.5)
NEUT%: 41 % (ref 38.4–76.8)
Platelets: 248 10*3/uL (ref 145–400)
RBC: 4.24 10*6/uL (ref 3.70–5.45)
RDW: 13.6 % (ref 11.2–14.5)
WBC: 3.1 10*3/uL — AB (ref 3.9–10.3)
lymph#: 1.4 10*3/uL (ref 0.9–3.3)

## 2013-09-22 LAB — LACTATE DEHYDROGENASE (CC13): LDH: 212 U/L (ref 125–245)

## 2013-09-22 MED ORDER — IOHEXOL 300 MG/ML  SOLN
100.0000 mL | Freq: Once | INTRAMUSCULAR | Status: AC | PRN
  Administered 2013-09-22: 100 mL via INTRAVENOUS

## 2013-09-24 ENCOUNTER — Telehealth: Payer: Self-pay | Admitting: *Deleted

## 2013-09-24 NOTE — Telephone Encounter (Signed)
Message copied by Norma Fredrickson on Wed Sep 24, 2013  3:29 PM ------      Message from: Ignacia Felling      Created: Wed Sep 24, 2013  3:26 PM                   ----- Message -----         From: Annia Belt, MD         Sent: 09/22/2013   8:17 PM           To: Ignacia Felling, RN, Jesse Fall, RN, #            Call pt  CT negative for lymphoma ------

## 2013-09-24 NOTE — Telephone Encounter (Signed)
Called and informed patient that CT is negative for lymphoma.  Per Dr. Beryle Beams.  Patient verbalized understanding.

## 2013-09-26 ENCOUNTER — Ambulatory Visit (HOSPITAL_BASED_OUTPATIENT_CLINIC_OR_DEPARTMENT_OTHER): Payer: Medicare Other | Admitting: Oncology

## 2013-09-26 VITALS — BP 140/76 | HR 72 | Temp 97.6°F | Resp 18 | Ht 64.0 in | Wt 181.9 lb

## 2013-09-26 DIAGNOSIS — D72819 Decreased white blood cell count, unspecified: Secondary | ICD-10-CM

## 2013-09-26 DIAGNOSIS — C8583 Other specified types of non-Hodgkin lymphoma, intra-abdominal lymph nodes: Secondary | ICD-10-CM

## 2013-09-28 NOTE — Progress Notes (Signed)
Hematology and Oncology Follow Up Visit  Taylor Huynh [id number] October 28, 1940 73 y.o. 09/28/2013 3:54 PM   Principle Diagnosis: Encounter Diagnoses  Name Primary?  . Other malignant lymphomas of intra-abdominal lymph nodes Yes  . Leukopenia      Interim History:   Followup visit for this pleasant 73 year old woman diagnosed with a high-grade B-cell non-Hodgkin's lymphoma in November 2010. She was under evaluation for a flare up of diverticulitis and was found to have a soft tissue mass in the area of the terminal ileum. While preparations were being made for elective surgery, she perforated a diverticulum and had to have emergency exploratory surgery. A temporary colostomy was done. The mass in the terminal ileum was resected. Staging evaluation did not show any additional disease outside the abdomen. She was treated with a combination of CHOP Rituxan for 4 cycles between 07/05/2009 in 09/06/2009. She has been followed clinically and radiographically since that time with no evidence for recurrent disease including the study done in anticipation of today's visit done on 09/22/13 which I personally reviewed. She has some chronic loose bowel movements ever since her surgery but denies any hematochezia or melena. No abdominal pain or cramps. She has had no interim infections.  Medications: reviewed  Allergies: No Known Allergies  Review of Systems: Hematology:  No bleeding or bruising ENT ROS: No sore throat Breast ROS:  Respiratory ROS: No cough or dyspnea Cardiovascular ROS:  No chest pain or palpitations Gastrointestinal ROS:  Chronic loose bowel movements since initial abdominal surgery  Genito-Urinary ROS: No urinary tract symptoms Musculoskeletal ROS: No muscle bone or joint pain Neurological ROS: No headache or change in vision Dermatological ROS: No rash or ecchymosis Remaining ROS negative:   Physical Exam: Blood pressure 140/76, pulse 72, temperature 97.6 F (36.4 C),  temperature source Oral, resp. rate 18, height '5\' 4"'  (1.626 m), weight 181 lb 14.4 oz (82.509 kg), SpO2 99.00%. Wt Readings from Last 3 Encounters:  09/26/13 181 lb 14.4 oz (82.509 kg)  05/19/13 180 lb 14.4 oz (82.056 kg)  12/12/11 177 lb 6.4 oz (80.468 kg)     General appearance: Well-nourished African American woman HENNT: Pharynx no erythema, exudate, mass, or ulcer. No thyromegaly or thyroid nodules Lymph nodes: No cervical, supraclavicular, or axillary lymphadenopathy Breasts:  Lungs: Clear to auscultation, resonant to percussion throughout Heart: Regular rhythm, no murmur, no gallop, no rub, no click, no edema Abdomen: Soft, nontender, normal bowel sounds, no mass, no organomegaly Extremities: No edema, no calf tenderness Musculoskeletal: no joint deformities GU:  Vascular: Carotid pulses 2+,  Neurologic: Alert, oriented, PERRLA,, cranial nerves grossly normal, motor strength 5 over 5, reflexes 1+ symmetric, upper body coordination normal, gait normal, Skin: No rash or ecchymosis  Lab Results: CBC W/Diff    Component Value Date/Time   WBC 3.1* 09/22/2013 1222   WBC 4.0 12/08/2009 0530   RBC 4.24 09/22/2013 1222   RBC 3.13* 12/08/2009 0530   RBC 3.29* 06/09/2009 0953   HGB 12.1 09/22/2013 1222   HGB 9.4* 12/08/2009 0530   HCT 37.3 09/22/2013 1222   HCT 28.0* 12/08/2009 0530   PLT 248 09/22/2013 1222   PLT 246 12/08/2009 0530   MCV 88.0 09/22/2013 1222   MCV 89.4 12/08/2009 0530   MCH 28.5 09/22/2013 1222   MCH 27.8 08/16/2009 0909   MCHC 32.4 09/22/2013 1222   MCHC 33.4 12/08/2009 0530   RDW 13.6 09/22/2013 1222   RDW 14.3 12/08/2009 0530   LYMPHSABS 1.4 09/22/2013 1222   LYMPHSABS 0.7  11/24/2009 1040   MONOABS 0.3 09/22/2013 1222   MONOABS 0.4 11/24/2009 1040   EOSABS 0.1 09/22/2013 1222   EOSABS 0.1 11/24/2009 1040   BASOSABS 0.0 09/22/2013 1222   BASOSABS 0.0 11/24/2009 1040     Chemistry      Component Value Date/Time   NA 140 09/22/2013 1222   NA 143 12/05/2011 0900   NA 141 06/21/2010 0842    K 3.5 09/22/2013 1222   K 4.1 12/05/2011 0900   K 3.6 06/21/2010 0842   CL 102 12/05/2011 0900   CL 106 06/21/2010 0842   CO2 27 09/22/2013 1222   CO2 26 12/05/2011 0900   CO2 28 06/21/2010 0842   BUN 13.5 09/22/2013 1222   BUN 13 12/05/2011 0900   BUN 14 06/21/2010 0842   CREATININE 0.8 09/22/2013 1222   CREATININE 0.9 12/05/2011 0900   CREATININE 0.78 06/21/2010 0842      Component Value Date/Time   CALCIUM 9.5 09/22/2013 1222   CALCIUM 8.7 12/05/2011 0900   CALCIUM 9.1 06/21/2010 0842   ALKPHOS 85 09/22/2013 1222   ALKPHOS 78 12/05/2011 0900   ALKPHOS 81 06/21/2010 0842   AST 20 09/22/2013 1222   AST 20 12/05/2011 0900   AST 22 06/21/2010 0842   ALT 22 09/22/2013 1222   ALT 25 12/05/2011 0900   ALT 27 06/21/2010 0842   BILITOT 0.40 09/22/2013 1222   BILITOT 0.70 12/05/2011 0900   BILITOT 0.4 06/21/2010 6168       Radiological Studies: Ct Abdomen Pelvis W Contrast  09/22/2013   CLINICAL DATA:  Non-Hodgkin's lymphoma.  EXAM: CT ABDOMEN AND PELVIS WITH CONTRAST  TECHNIQUE: Multidetector CT imaging of the abdomen and pelvis was performed using the standard protocol following bolus administration of intravenous contrast.  CONTRAST:  167m OMNIPAQUE IOHEXOL 300 MG/ML  SOLN  COMPARISON:  05/14/2013, 12/05/2011 and PET CT 09/22/2009  FINDINGS: The lung bases are clear. No pulmonary nodules. Minimal dependent atelectasis. A small hiatal hernia is again demonstrated. The heart is normal in size. No pericardial effusion.  The liver is unremarkable and stable. Small calcifications are noted along the surface of the liver. No biliary dilatation. Stable mild common bile duct dilatation due to prior cholecystectomy. The pancreas demonstrates stable fatty changes. No focal lesion or inflammatory change. The spleen is normal in size. No focal lesions. The adrenal glands and kidneys are stable. A left renal cyst is unchanged and there are scarring changes involving the right kidney.  The stomach, duodenum, small bowel and  colon are unremarkable. Stable colonic diverticulosis. Stable focal areas soft tissue thickening along the left lateral margin of the sigmoid colon near the anastomosis. This is likely post operative changed and has not changed since 2013. No CT findings for recurrent small bowel lymphoma. There are small scattered mesenteric and retroperitoneal lymph nodes but no mass or overt adenopathy. The lymph node at the iliac crest just anterior to the IVC this slightly smaller since the prior CT it measures a maximum of 5.5 mm and previously measured 8 mm.  The bladder is unremarkable. The uterus and ovaries are surgically absent. No pelvic mass or adenopathy. No inguinal mass or adenopathy.  The bony structures are unremarkable and stable.  IMPRESSION: No CT findings to suggest recurrent lymphoma in the abdomen/ pelvis.   Electronically Signed   By: MKalman JewelsM.D.   On: 09/22/2013 16:18    Impression:  1. Extranodal high-grade B-cell non-Hodgkin's lymphoma involving the terminal ileum status post gross  tumor resection November 2010 followed by four cycles of CHOP/Rituxan 07/05/2009 through 09/06/2009. no evidence of recurrent disease now out 41/2 years.  She would like to graduate from our practice at this time and I think this is reasonable. She has a high chance of being cured. We would be happy to see her again if the need arises in the future.  2. Diverticular disease with history of perforation treated with sigmoid colectomy and temporary colostomy.   3. Leukopenia-initially noted February 2011. Bone marrow biopsy done as part of her lymphoma staging evaluation in November 2010 did not show any evidence for lymphoma or other dysplastic changes. This may be an ethnic variation of normal.   4. Hiatal hernia with reflux-she continues Prilosec  #5. History of seizure disorder currently not active. Not on any chronic antiseizure medication.    CC: Patient Care Team: Melinda Crutch, MD as PCP - General  (Family Medicine)   Annia Belt, MD 3/8/20153:54 PM

## 2014-01-27 ENCOUNTER — Ambulatory Visit: Payer: Medicare Other | Admitting: Oncology

## 2014-04-25 ENCOUNTER — Encounter: Payer: Self-pay | Admitting: Gastroenterology

## 2014-05-07 ENCOUNTER — Telehealth: Payer: Self-pay | Admitting: Gastroenterology

## 2014-05-07 NOTE — Telephone Encounter (Signed)
Please advise 

## 2014-05-07 NOTE — Telephone Encounter (Signed)
No, it does not need to be any sooner for routine colon cancer screening.

## 2014-05-07 NOTE — Telephone Encounter (Signed)
Taylor Huynh was notified of Dr Ardis Hughs recommendations

## 2015-09-12 ENCOUNTER — Emergency Department (HOSPITAL_COMMUNITY)
Admission: EM | Admit: 2015-09-12 | Discharge: 2015-09-12 | Disposition: A | Discharge status: Home / Self Care | Payer: Medicare Other | Attending: Emergency Medicine | Admitting: Emergency Medicine

## 2015-09-12 ENCOUNTER — Encounter (HOSPITAL_COMMUNITY): Payer: Self-pay | Admitting: *Deleted

## 2015-09-12 ENCOUNTER — Emergency Department (HOSPITAL_COMMUNITY): Payer: Medicare Other

## 2015-09-12 DIAGNOSIS — Z79899 Other long term (current) drug therapy: Secondary | ICD-10-CM | POA: Diagnosis not present

## 2015-09-12 DIAGNOSIS — K219 Gastro-esophageal reflux disease without esophagitis: Secondary | ICD-10-CM | POA: Insufficient documentation

## 2015-09-12 DIAGNOSIS — Z862 Personal history of diseases of the blood and blood-forming organs and certain disorders involving the immune mechanism: Secondary | ICD-10-CM | POA: Insufficient documentation

## 2015-09-12 DIAGNOSIS — R42 Dizziness and giddiness: Secondary | ICD-10-CM | POA: Diagnosis present

## 2015-09-12 DIAGNOSIS — I10 Essential (primary) hypertension: Secondary | ICD-10-CM | POA: Diagnosis not present

## 2015-09-12 DIAGNOSIS — Z8572 Personal history of non-Hodgkin lymphomas: Secondary | ICD-10-CM | POA: Insufficient documentation

## 2015-09-12 HISTORY — DX: Unspecified convulsions: R56.9

## 2015-09-12 HISTORY — DX: Gastro-esophageal reflux disease without esophagitis: K21.9

## 2015-09-12 LAB — CBC WITH DIFFERENTIAL/PLATELET
BASOS ABS: 0 10*3/uL (ref 0.0–0.1)
Basophils Relative: 1 %
EOS PCT: 4 %
Eosinophils Absolute: 0.1 10*3/uL (ref 0.0–0.7)
HEMATOCRIT: 36.1 % (ref 36.0–46.0)
Hemoglobin: 11.4 g/dL — ABNORMAL LOW (ref 12.0–15.0)
LYMPHS PCT: 36 %
Lymphs Abs: 1.2 10*3/uL (ref 0.7–4.0)
MCH: 27.4 pg (ref 26.0–34.0)
MCHC: 31.6 g/dL (ref 30.0–36.0)
MCV: 86.8 fL (ref 78.0–100.0)
MONO ABS: 0.3 10*3/uL (ref 0.1–1.0)
MONOS PCT: 10 %
Neutro Abs: 1.5 10*3/uL — ABNORMAL LOW (ref 1.7–7.7)
Neutrophils Relative %: 49 %
PLATELETS: 233 10*3/uL (ref 150–400)
RBC: 4.16 MIL/uL (ref 3.87–5.11)
RDW: 14 % (ref 11.5–15.5)
WBC: 3.2 10*3/uL — ABNORMAL LOW (ref 4.0–10.5)

## 2015-09-12 LAB — URINALYSIS, ROUTINE W REFLEX MICROSCOPIC
BILIRUBIN URINE: NEGATIVE
GLUCOSE, UA: NEGATIVE mg/dL
HGB URINE DIPSTICK: NEGATIVE
KETONES UR: NEGATIVE mg/dL
Leukocytes, UA: NEGATIVE
Nitrite: NEGATIVE
PH: 6 (ref 5.0–8.0)
PROTEIN: NEGATIVE mg/dL
Specific Gravity, Urine: 1.008 (ref 1.005–1.030)

## 2015-09-12 LAB — COMPREHENSIVE METABOLIC PANEL
ALT: 16 U/L (ref 14–54)
ANION GAP: 11 (ref 5–15)
AST: 18 U/L (ref 15–41)
Albumin: 3.7 g/dL (ref 3.5–5.0)
Alkaline Phosphatase: 65 U/L (ref 38–126)
BUN: 12 mg/dL (ref 6–20)
CALCIUM: 8.9 mg/dL (ref 8.9–10.3)
CO2: 21 mmol/L — ABNORMAL LOW (ref 22–32)
CREATININE: 0.85 mg/dL (ref 0.44–1.00)
Chloride: 105 mmol/L (ref 101–111)
Glucose, Bld: 92 mg/dL (ref 65–99)
POTASSIUM: 3.4 mmol/L — AB (ref 3.5–5.1)
Sodium: 137 mmol/L (ref 135–145)
Total Bilirubin: 0.4 mg/dL (ref 0.3–1.2)
Total Protein: 6.6 g/dL (ref 6.5–8.1)

## 2015-09-12 LAB — TROPONIN I

## 2015-09-12 MED ORDER — MECLIZINE HCL 12.5 MG PO TABS: 25.0000 mg | ORAL_TABLET | Freq: Three times a day (TID) | ORAL | Status: DC | PRN

## 2015-09-12 NOTE — ED Notes (Signed)
MD at bedside. 

## 2015-09-12 NOTE — ED Notes (Signed)
Patient transported to X-ray 

## 2015-09-12 NOTE — Discharge Instructions (Signed)
Benign Positional Vertigo Vertigo is the feeling that you or your surroundings are moving when they are not. Benign positional vertigo is the most common form of vertigo. The cause of this condition is not serious (is benign). This condition is triggered by certain movements and positions (is positional). This condition can be dangerous if it occurs while you are doing something that could endanger you or others, such as driving.  CAUSES In many cases, the cause of this condition is not known. It may be caused by a disturbance in an area of the inner ear that helps your brain to sense movement and balance. This disturbance can be caused by a viral infection (labyrinthitis), head injury, or repetitive motion. RISK FACTORS This condition is more likely to develop in:  Women.  People who are 50 years of age or older. SYMPTOMS Symptoms of this condition usually happen when you move your head or your eyes in different directions. Symptoms may start suddenly, and they usually last for less than a minute. Symptoms may include:  Loss of balance and falling.  Feeling like you are spinning or moving.  Feeling like your surroundings are spinning or moving.  Nausea and vomiting.  Blurred vision.  Dizziness.  Involuntary eye movement (nystagmus). Symptoms can be mild and cause only slight annoyance, or they can be severe and interfere with daily life. Episodes of benign positional vertigo may return (recur) over time, and they may be triggered by certain movements. Symptoms may improve over time. DIAGNOSIS This condition is usually diagnosed by medical history and a physical exam of the head, neck, and ears. You may be referred to a health care provider who specializes in ear, nose, and throat (ENT) problems (otolaryngologist) or a provider who specializes in disorders of the nervous system (neurologist). You may have additional testing, including:  MRI.  A CT scan.  Eye movement tests. Your  health care provider may ask you to change positions quickly while he or she watches you for symptoms of benign positional vertigo, such as nystagmus. Eye movement may be tested with an electronystagmogram (ENG), caloric stimulation, the Dix-Hallpike test, or the roll test.  An electroencephalogram (EEG). This records electrical activity in your brain.  Hearing tests. TREATMENT Usually, your health care provider will treat this by moving your head in specific positions to adjust your inner ear back to normal. Surgery may be needed in severe cases, but this is rare. In some cases, benign positional vertigo may resolve on its own in 2-4 weeks. HOME CARE INSTRUCTIONS Safety  Move slowly.Avoid sudden body or head movements.  Avoid driving.  Avoid operating heavy machinery.  Avoid doing any tasks that would be dangerous to you or others if a vertigo episode would occur.  If you have trouble walking or keeping your balance, try using a cane for stability. If you feel dizzy or unstable, sit down right away.  Return to your normal activities as told by your health care provider. Ask your health care provider what activities are safe for you. General Instructions  Take over-the-counter and prescription medicines only as told by your health care provider.  Avoid certain positions or movements as told by your health care provider.  Drink enough fluid to keep your urine clear or pale yellow.  Keep all follow-up visits as told by your health care provider. This is important. SEEK MEDICAL CARE IF:  You have a fever.  Your condition gets worse or you develop new symptoms.  Your family or friends   notice any behavioral changes.  Your nausea or vomiting gets worse.  You have numbness or a "pins and needles" sensation. SEEK IMMEDIATE MEDICAL CARE IF:  You have difficulty speaking or moving.  You are always dizzy.  You faint.  You develop severe headaches.  You have weakness in your  legs or arms.  You have changes in your hearing or vision.  You develop a stiff neck.  You develop sensitivity to light.   This information is not intended to replace advice given to you by your health care provider. Make sure you discuss any questions you have with your health care provider.   Document Released: 04/17/2006 Document Revised: 03/31/2015 Document Reviewed: 11/02/2014 Elsevier Interactive Patient Education 2016 [employer]  

## 2015-09-12 NOTE — ED Notes (Signed)
Pt transported to MRI from xray.

## 2015-09-12 NOTE — ED Notes (Signed)
Pt presents via POV c/o dizziness x 1 week.  Reports for the last week when she goes to bed she becomes dizzy and lightheaded but when she would get up in the AM she would feel ok, however this morning she got up and was lightheaded and dizzy, almost causing her to fall.  Pt a x 4, denies headache, blurred vision, numbness/tinging, weakness.  Reports hx of seixure, denies hx or family hx of stroke.  Pt a x 4, NAD, Neuro intact.
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
## 2015-09-13 LAB — URINE CULTURE: SPECIAL REQUESTS: NORMAL

## 2016-06-11 ENCOUNTER — Emergency Department (HOSPITAL_COMMUNITY)
Admission: EM | Admit: 2016-06-11 | Discharge: 2016-06-11 | Disposition: A | Discharge status: Home / Self Care | Payer: Medicare Other | Attending: Emergency Medicine | Admitting: Emergency Medicine

## 2016-06-11 ENCOUNTER — Encounter (HOSPITAL_COMMUNITY): Payer: Self-pay

## 2016-06-11 ENCOUNTER — Emergency Department (HOSPITAL_COMMUNITY): Payer: Medicare Other

## 2016-06-11 DIAGNOSIS — I10 Essential (primary) hypertension: Secondary | ICD-10-CM | POA: Diagnosis not present

## 2016-06-11 DIAGNOSIS — Z85858 Personal history of malignant neoplasm of other endocrine glands: Secondary | ICD-10-CM | POA: Diagnosis not present

## 2016-06-11 DIAGNOSIS — J40 Bronchitis, not specified as acute or chronic: Secondary | ICD-10-CM | POA: Diagnosis not present

## 2016-06-11 DIAGNOSIS — R05 Cough: Secondary | ICD-10-CM | POA: Diagnosis present

## 2016-06-11 MED ORDER — BENZONATATE 100 MG PO CAPS: 100.0000 mg | ORAL_CAPSULE | Freq: Three times a day (TID) | ORAL | 0 refills | Status: DC

## 2016-06-11 MED ORDER — AZITHROMYCIN 250 MG PO TABS: 250.0000 mg | ORAL_TABLET | Freq: Every day | ORAL | 0 refills | Status: DC

## 2016-06-11 MED ORDER — ALBUTEROL SULFATE HFA 108 (90 BASE) MCG/ACT IN AERS: 1.0000 | INHALATION_SPRAY | Freq: Four times a day (QID) | RESPIRATORY_TRACT | 0 refills | Status: DC | PRN

## 2016-06-11 NOTE — ED Triage Notes (Signed)
Patient complains of 1 week of congestion and productive cough. Using otc meds with no relief, NAD

## 2016-06-11 NOTE — ED Provider Notes (Signed)
Emergency Department Provider Note   I have reviewed the triage vital signs and the nursing notes.   HISTORY  Chief Complaint Cough   HPI Taylor Huynh is a 75 y.o. female with PMH of HTN and GERD presents to the emergency department for evaluation of productive cough for the last week. Patient states initially she thought it was GERD and took her home medications with no relief. Her cough worsened over the past 2 days and was not responsive to over-the-counter medications. He denies any associated chest pain or difficulty breathing. No fevers, shaking chills, body aches. No known sick contacts. No exposure to tobacco smoke. The patient denies seeing any blood in her sputum. No associated nausea, vomiting, diarrhea. She continues to eat and drink normally but is frustrated that her cough continues.   Past Medical History:  Diagnosis Date  . GERD (gastroesophageal reflux disease)   . HH (hiatus hernia) 12/12/2011  . Hypertension   . Leukopenia 12/12/2011  . nhl dx'd 2010  . Seizures ([place]) reports "years ago"  . Syncope 12/12/2011   Hx seizures - not documented - age 62; recent syncope 6/12 and x2, 12/12; negative MRI brain 1/13    Patient Active Problem List   Diagnosis Date Noted  . Hx of diverticulitis of colon 12/12/2011  . Leukopenia 12/12/2011  . Syncope 12/12/2011  . HH (hiatus hernia) 12/12/2011  . 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 GERD 05/12/2008    Past Surgical History:  Procedure Laterality Date  . COLON SURGERY      Current Outpatient Rx  . Order #: [id number] Class: Historical Med  . Order #: [id number] Class: Historical Med  . Order #: [id number] Class: Historical Med  . Order #: [id number] Class: Historical Med  . Order #: [id number] Class: Print  . Order #: [id number] Class: Print  . Order #: [id number] Class: Print  . Order #: [id number] Class: Print    Allergies Patient has no known allergies.  No family history on  file.  Social History Social History  Substance Use Topics  . Smoking status: Never Smoker  . Smokeless tobacco: Not on file  . Alcohol use No    Review of Systems  Constitutional: No fever/chills Eyes: No visual changes. ENT: No sore throat. Cardiovascular: Denies chest pain. Respiratory: Denies shortness of breath. Positive cough.  Gastrointestinal: No abdominal pain.  No nausea, no vomiting.  No diarrhea.  No constipation. Genitourinary: Negative for dysuria. Musculoskeletal: Negative for back pain. Skin: Negative for rash. Neurological: Negative for headaches, focal weakness or numbness.  10-point ROS otherwise negative.  ____________________________________________   PHYSICAL EXAM:  VITAL SIGNS: ED Triage Vitals  Enc Vitals Group     BP 06/11/16 0825 (!) 157/107     Pulse Rate 06/11/16 0825 81     Resp 06/11/16 0825 18     Temp 06/11/16 0825 98.7 F (37.1 C)     Temp Source 06/11/16 0825 Oral     SpO2 06/11/16 0825 100 %     Weight 06/11/16 0826 189 lb (85.7 kg)     Height 06/11/16 0826 5\' 4"  (1.626 m)    Constitutional: Alert and oriented. Well appearing and in no acute distress. Eyes: Conjunctivae are normal.  Head: Atraumatic. Nose: No congestion/rhinnorhea. Mouth/Throat: Mucous membranes are moist.  Neck: No stridor.  Cardiovascular: Normal rate, regular rhythm. Good peripheral circulation. Grossly normal heart sounds.   Respiratory: Normal respiratory effort.  No retractions. Lungs CTAB. Gastrointestinal: Soft and nontender. No distention.  Musculoskeletal: No lower extremity  tenderness nor edema. No gross deformities of extremities. Neurologic:  Normal speech and language. No gross focal neurologic deficits are appreciated.  Skin:  Skin is warm, dry and intact. No rash noted. ____________________________________________  RADIOLOGY  CLINICAL DATA: Cough and chest pain for 1 week.  EXAM: CHEST 2 VIEW  COMPARISON: 09/12/2015 and prior chest  radiographs  FINDINGS: Mild cardiomegaly and mild chronic peribronchial thickening again noted.  There is no evidence of focal airspace disease, pulmonary edema, suspicious pulmonary nodule/mass, pleural effusion, or pneumothorax.  No acute bony abnormalities are identified.  IMPRESSION: No evidence of acute cardiopulmonary disease.  Mild cardiomegaly and mild chronic peribronchial thickening.   Electronically Signed By: Margarette Canada M.D. On: 06/11/2016 09:07 ____________________________________________   PROCEDURES  Procedure(s) performed:   Procedures  None ____________________________________________   INITIAL IMPRESSION / ASSESSMENT AND PLAN / ED COURSE  Pertinent labs & imaging results that were available during my care of the patient were reviewed by me and considered in my medical decision making (see chart for details).  Patient presents to the emergent department for evaluation of persistent cough that is productive. No associated fevers. Patient is well-appearing with normal vital signs and oxygen saturation. Lung exam is largely unremarkable. Chest x-ray performed in the emergency department shows no evidence of focal infiltrate but does show some peribronchial thickening. Suspect bronchitis. Greater than 1 week of symptoms plan to initiate azithromycin along with albuterol inhaler and Tessalon Perles. Discussed return precautions in detail.  At this time, I do not feel there is any life-threatening condition present. I have reviewed and discussed all results (EKG, imaging, lab, urine as appropriate), exam findings with patient. I have reviewed nursing notes and appropriate previous records.  I feel the patient is safe to be discharged home without further emergent workup. Discussed usual and customary return precautions. Patient and family (if present) verbalize understanding and are comfortable with this plan.  Patient will follow-up with their primary care  provider. If they do not have a primary care provider, information for follow-up has been provided to them. All questions have been answered.    ____________________________________________  FINAL CLINICAL IMPRESSION(S) / ED DIAGNOSES  Final diagnoses:  Bronchitis     MEDICATIONS GIVEN DURING THIS VISIT:  None  NEW OUTPATIENT MEDICATIONS STARTED DURING THIS VISIT:  Discharge Medication List as of 06/11/2016  9:25 AM    START taking these medications   Details  albuterol (PROVENTIL HFA;VENTOLIN HFA) 108 (90 Base) MCG/ACT inhaler Inhale 1-2 puffs into the lungs every 6 (six) hours as needed for wheezing or shortness of breath., Starting Sun 06/11/2016, Print    azithromycin (ZITHROMAX) 250 MG tablet Take 1 tablet (250 mg total) by mouth daily. Take first 2 tablets together, then 1 every day until finished., Starting Sun 06/11/2016, Print    benzonatate (TESSALON) 100 MG capsule Take 1 capsule (100 mg total) by mouth every 8 (eight) hours., Starting Sun 06/11/2016, Print          Note:  This document was prepared using Dragon voice recognition software and may include unintentional dictation errors.  Nanda Quinton, MD Emergency Medicine   Margette Fast, MD 06/12/16 1128

## 2016-06-11 NOTE — ED Notes (Signed)
On way to XR 

## 2016-06-11 NOTE — Discharge Instructions (Signed)

## 2016-09-13 ENCOUNTER — Encounter (HOSPITAL_COMMUNITY): Payer: Self-pay | Admitting: *Deleted

## 2016-09-13 ENCOUNTER — Emergency Department (HOSPITAL_COMMUNITY)
Admission: EM | Admit: 2016-09-13 | Discharge: 2016-09-13 | Disposition: A | Discharge status: Home / Self Care | Payer: Medicare Other | Attending: Emergency Medicine | Admitting: Emergency Medicine

## 2016-09-13 DIAGNOSIS — Y939 Activity, unspecified: Secondary | ICD-10-CM | POA: Diagnosis not present

## 2016-09-13 DIAGNOSIS — S60811A Abrasion of right wrist, initial encounter: Secondary | ICD-10-CM | POA: Insufficient documentation

## 2016-09-13 DIAGNOSIS — Y92410 Unspecified street and highway as the place of occurrence of the external cause: Secondary | ICD-10-CM | POA: Insufficient documentation

## 2016-09-13 DIAGNOSIS — Z79899 Other long term (current) drug therapy: Secondary | ICD-10-CM | POA: Insufficient documentation

## 2016-09-13 DIAGNOSIS — Y999 Unspecified external cause status: Secondary | ICD-10-CM | POA: Diagnosis not present

## 2016-09-13 DIAGNOSIS — I10 Essential (primary) hypertension: Secondary | ICD-10-CM | POA: Insufficient documentation

## 2016-09-13 DIAGNOSIS — S6991XA Unspecified injury of right wrist, hand and finger(s), initial encounter: Secondary | ICD-10-CM | POA: Diagnosis present

## 2016-09-13 LAB — I-STAT CHEM 8, ED
BUN: 15 mg/dL (ref 6–20)
CREATININE: 0.9 mg/dL (ref 0.44–1.00)
Calcium, Ion: 1.12 mmol/L — ABNORMAL LOW (ref 1.15–1.40)
Chloride: 106 mmol/L (ref 101–111)
Glucose, Bld: 91 mg/dL (ref 65–99)
HEMATOCRIT: 37 % (ref 36.0–46.0)
HEMOGLOBIN: 12.6 g/dL (ref 12.0–15.0)
POTASSIUM: 3.7 mmol/L (ref 3.5–5.1)
SODIUM: 144 mmol/L (ref 135–145)
TCO2: 27 mmol/L (ref 0–100)

## 2016-09-13 NOTE — Discharge Instructions (Signed)
Follow up with your md if problems.  Take tylenol for any pain
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
## 2017-05-15 ENCOUNTER — Other Ambulatory Visit: Payer: Self-pay | Admitting: Family Medicine

## 2017-05-15 DIAGNOSIS — Z1231 Encounter for screening mammogram for malignant neoplasm of breast: Secondary | ICD-10-CM

## 2017-06-01 ENCOUNTER — Ambulatory Visit
Admission: RE | Admit: 2017-06-01 | Discharge: 2017-06-01 | Disposition: A | Discharge status: Home / Self Care | Payer: Medicare Other | Source: Ambulatory Visit | Attending: Family Medicine | Admitting: Family Medicine

## 2017-06-01 DIAGNOSIS — Z1231 Encounter for screening mammogram for malignant neoplasm of breast: Secondary | ICD-10-CM

## 2017-06-04 ENCOUNTER — Other Ambulatory Visit: Payer: Self-pay | Admitting: Family Medicine

## 2017-06-04 DIAGNOSIS — R928 Other abnormal and inconclusive findings on diagnostic imaging of breast: Secondary | ICD-10-CM

## 2017-06-11 ENCOUNTER — Ambulatory Visit
Admission: RE | Admit: 2017-06-11 | Discharge: 2017-06-11 | Disposition: A | Discharge status: Home / Self Care | Payer: Medicare Other | Source: Ambulatory Visit | Attending: Family Medicine | Admitting: Family Medicine

## 2017-06-11 ENCOUNTER — Other Ambulatory Visit: Payer: Self-pay | Admitting: Family Medicine

## 2017-06-11 DIAGNOSIS — R928 Other abnormal and inconclusive findings on diagnostic imaging of breast: Secondary | ICD-10-CM

## 2017-12-10 ENCOUNTER — Ambulatory Visit
Admission: RE | Admit: 2017-12-10 | Discharge: 2017-12-10 | Disposition: A | Discharge status: Home / Self Care | Payer: Medicare Other | Source: Ambulatory Visit | Attending: Family Medicine | Admitting: Family Medicine

## 2017-12-10 ENCOUNTER — Ambulatory Visit: Payer: Medicare Other

## 2017-12-10 DIAGNOSIS — R928 Other abnormal and inconclusive findings on diagnostic imaging of breast: Secondary | ICD-10-CM

## 2018-07-13 ENCOUNTER — Encounter: Payer: Self-pay | Admitting: Gastroenterology

## 2018-07-18 ENCOUNTER — Ambulatory Visit
Admission: RE | Admit: 2018-07-18 | Discharge: 2018-07-18 | Disposition: A | Discharge status: Home / Self Care | Payer: Medicare Other | Source: Ambulatory Visit | Attending: Internal Medicine | Admitting: Internal Medicine

## 2018-07-18 ENCOUNTER — Other Ambulatory Visit: Payer: Self-pay | Admitting: Internal Medicine

## 2018-07-18 DIAGNOSIS — I889 Nonspecific lymphadenitis, unspecified: Secondary | ICD-10-CM

## 2018-07-18 MED ORDER — IOPAMIDOL (ISOVUE-300) INJECTION 61%
100.0000 mL | Freq: Once | INTRAVENOUS | Status: AC | PRN
  Administered 2018-07-18: 100 mL via INTRAVENOUS

## 2018-07-22 ENCOUNTER — Telehealth: Payer: Self-pay | Admitting: *Deleted

## 2018-07-22 ENCOUNTER — Telehealth: Payer: Self-pay | Admitting: Hematology

## 2018-07-22 NOTE — Telephone Encounter (Signed)
New referral received from Dr. Shelbie Proctor for rt inguinal lymph nodes. Pt has a hx of b-cell nhl. Pt has been scheduled to see Dr. Irene Limbo on 1/13 at 11am. I cld Tammy and asked if the provider can order a bx and to inform me of when. If not then made aware of the pt's appt.

## 2018-07-26 ENCOUNTER — Other Ambulatory Visit (HOSPITAL_COMMUNITY): Payer: Self-pay | Admitting: Internal Medicine

## 2018-07-26 DIAGNOSIS — Z8572 Personal history of non-Hodgkin lymphomas: Secondary | ICD-10-CM

## 2018-07-26 DIAGNOSIS — I889 Nonspecific lymphadenitis, unspecified: Secondary | ICD-10-CM

## 2018-07-29 ENCOUNTER — Other Ambulatory Visit (HOSPITAL_COMMUNITY): Payer: Self-pay | Admitting: Internal Medicine

## 2018-07-29 DIAGNOSIS — I889 Nonspecific lymphadenitis, unspecified: Secondary | ICD-10-CM

## 2018-07-29 DIAGNOSIS — Z8572 Personal history of non-Hodgkin lymphomas: Secondary | ICD-10-CM

## 2018-07-30 ENCOUNTER — Telehealth: Payer: Self-pay | Admitting: Hematology

## 2018-07-30 ENCOUNTER — Encounter: Payer: Self-pay | Admitting: Hematology

## 2018-07-30 NOTE — Telephone Encounter (Signed)
Cld the pt to inform her we are rescheduling her appt w/Dr. Irene Limbo until after she's had the bx on 1/14. Appt rescheduled to 1/21 at 11am. Pt agreed to the new appt date and time. Letter mailed.

## 2018-08-05 ENCOUNTER — Other Ambulatory Visit: Payer: Self-pay | Admitting: Radiology

## 2018-08-05 ENCOUNTER — Ambulatory Visit: Payer: Medicare Other | Admitting: Hematology

## 2018-08-06 ENCOUNTER — Encounter (HOSPITAL_COMMUNITY): Payer: Self-pay

## 2018-08-06 ENCOUNTER — Ambulatory Visit (HOSPITAL_COMMUNITY)
Admission: RE | Admit: 2018-08-06 | Discharge: 2018-08-06 | Disposition: A | Discharge status: Home / Self Care | Payer: Medicare Other | Source: Ambulatory Visit | Attending: Internal Medicine | Admitting: Internal Medicine

## 2018-08-06 ENCOUNTER — Other Ambulatory Visit: Payer: Self-pay

## 2018-08-06 DIAGNOSIS — I10 Essential (primary) hypertension: Secondary | ICD-10-CM | POA: Diagnosis not present

## 2018-08-06 DIAGNOSIS — K219 Gastro-esophageal reflux disease without esophagitis: Secondary | ICD-10-CM | POA: Insufficient documentation

## 2018-08-06 DIAGNOSIS — I700 Atherosclerosis of aorta: Secondary | ICD-10-CM | POA: Insufficient documentation

## 2018-08-06 DIAGNOSIS — Z8572 Personal history of non-Hodgkin lymphomas: Secondary | ICD-10-CM

## 2018-08-06 DIAGNOSIS — R590 Localized enlarged lymph nodes: Secondary | ICD-10-CM | POA: Insufficient documentation

## 2018-08-06 DIAGNOSIS — C8590 Non-Hodgkin lymphoma, unspecified, unspecified site: Secondary | ICD-10-CM | POA: Diagnosis present

## 2018-08-06 DIAGNOSIS — Z79899 Other long term (current) drug therapy: Secondary | ICD-10-CM | POA: Insufficient documentation

## 2018-08-06 DIAGNOSIS — I889 Nonspecific lymphadenitis, unspecified: Secondary | ICD-10-CM

## 2018-08-06 LAB — CBC
HCT: 34 % — ABNORMAL LOW (ref 36.0–46.0)
Hemoglobin: 10.6 g/dL — ABNORMAL LOW (ref 12.0–15.0)
MCH: 28.3 pg (ref 26.0–34.0)
MCHC: 31.2 g/dL (ref 30.0–36.0)
MCV: 90.7 fL (ref 80.0–100.0)
Platelets: 201 10*3/uL (ref 150–400)
RBC: 3.75 MIL/uL — AB (ref 3.87–5.11)
RDW: 14.6 % (ref 11.5–15.5)
WBC: 4.3 10*3/uL (ref 4.0–10.5)
nRBC: 0 % (ref 0.0–0.2)

## 2018-08-06 LAB — PROTIME-INR
INR: 1.05
Prothrombin Time: 13.7 seconds (ref 11.4–15.2)

## 2018-08-06 MED ORDER — LIDOCAINE HCL (PF) 1 % IJ SOLN
INTRAMUSCULAR | Status: AC
  Filled 2018-08-06: qty 30

## 2018-08-06 MED ORDER — HYDROCODONE-ACETAMINOPHEN 5-325 MG PO TABS: 1.0000 | ORAL_TABLET | ORAL | Status: DC | PRN

## 2018-08-06 MED ORDER — MIDAZOLAM HCL 2 MG/2ML IJ SOLN
INTRAMUSCULAR | Status: AC
  Filled 2018-08-06: qty 2

## 2018-08-06 MED ORDER — SODIUM CHLORIDE 0.9 % IV SOLN: INTRAVENOUS | Status: DC

## 2018-08-06 MED ORDER — FENTANYL CITRATE (PF) 100 MCG/2ML IJ SOLN
INTRAMUSCULAR | Status: AC
  Filled 2018-08-06: qty 2

## 2018-08-06 NOTE — Discharge Instructions (Signed)
Lymph Node Biopsy, Care After This sheet gives you information about how to care for yourself after your procedure. Your health care provider may also give you more specific instructions. If you have problems or questions, contact your health care provider. What can I expect after the procedure? After the procedure, it is common to have:  Blue urine or stool for the next 24-48 hours. This is normal. It is caused by the dye used during the procedure.  Blue skin at the injection site. This may last for up to 8 weeks.  Numbness, tingling, or pain near your incision site.  Swelling or bruising near your incision. Follow these instructions at home: Incision care      Follow instructions from your health care provider about how to take care of your incision. Make sure you: ? Wash your hands with soap and water before you change your bandage (dressing). If soap and water are not available, use hand sanitizer. ? Change your dressing as told by your health care provider. ? Leave stitches (sutures), skin glue, or adhesive strips in place. These skin closures may need to stay in place for 2 weeks or longer. If adhesive strip edges start to loosen and curl up, you may trim the loose edges. Do not remove adhesive strips completely unless your health care provider tells you to do that.  Check your incision area every day for signs of infection. Check for: ? Redness, swelling, or pain. ? Fluid or blood. ? Warmth. ? Pus or a bad smell.  Do not take baths, swim, or use a hot tub until your health care provider approves. Ask your health care provider if you can take showers. You may be able to shower 24 hours after your procedure. After a shower, pat the incision area dry with a clean towel. Do not rub the incision. That could cause bleeding. Activity  Avoid activities that take a lot of effort.  Return to your normal activities as told by your health care provider. Ask your health care provider  what activities are safe for you. General instructions  Take over-the-counter and prescription medicines only as told by your health care provider.  You may resume your regular diet.  If the procedure was done on or near the lymph nodes under your arm (axillary lymph nodes), do not have your blood pressure taken or have blood drawn from the arm on the side of the biopsy until your health care provider says it is okay.  You may need to be screened for extra fluid around the lymph nodes (lymphedema). Follow instructions from your health care provider about how often you should be checked.  Keep all follow-up visits as told by your health care provider. This is important. Contact a health care provider if:  Your pain medicine is not helping.  You have more redness, swelling, or pain around your biopsy site.  You have more fluid or blood coming from your incision.  Your incision feels warm to the touch.  You have pus or a bad smell coming from your incision.  You have nausea and vomiting.  You have any new bruising.  You have chills or a fever. Get help right away if:  You have pain that is getting worse, and your medicine is not helping.  You have vomiting that will not stop.  You have chest pain or trouble breathing. Summary  After the procedure, it is common to have blue urine or stool for the next 24-48 hours.  Follow instructions from your health care provider about how to take care of your incision. Check your incision area every day for signs of infection.  Take over-the-counter and prescription medicines only as told by your health care provider.  Ask your health care provider when you can return to your normal activities. This information is not intended to replace advice given to you by your health care provider. Make sure you discuss any questions you have with your health care provider. Document Released: 02/22/2004 Document Revised: 07/23/2017 Document Reviewed:  07/23/2017 Elsevier Interactive Patient Education  2019 [employer].

## 2018-08-06 NOTE — Procedures (Signed)
  Procedure: [place] core R inguinal LAN 18g x4 in saline EBL:   minimal Complications:  none immediate  See full dictation in [facility].  Dillard Cannon MD Main # [phone] Pager  [phone]

## 2018-08-06 NOTE — Progress Notes (Signed)
Patient discharged home after right groin biopsy. Patient denies any pain or discomfort at biopsy site.  Discharge AVS printed and given to patient & husband.

## 2018-08-06 NOTE — H&P (Signed)
Chief Complaint: Patient was seen in consultation today for lymph node biopsy.   Referring Physician(s): [name]  Supervising Physician: Arne Cleveland  Patient Status: [facility] - Out-pt  History of Present Illness: Taylor Huynh is a 78 y.o. female with a past medical history significant for GERD, HTN, seizures and non-Hodgkin lymphoma who presents today for inguinal lymph node biopsy. Limited history available per chart for review - per patient she has a history of lymphoma s/p chemotherapy in 2011 and noted a lump on the right side of her abdomen in the middle of December 2019. She presented for evaluation and underwent CT abdomen/pelvis with contrast which showed extensive abdominal-pelvic adenopathy, consistent with recurrent lymphoma. Request has been made for inguinal lymph node biopsy today in IR. She has been scheduled to see Dr. Irene Limbo for further evaluation and management after the biopsy has been completed.  She denies any complaints today aside from some anxiety regarding the procedure. She states understanding of procedure and wishes to try biopsy without sedation first if possible.   Past Medical History:  Diagnosis Date  . GERD (gastroesophageal reflux disease)   . HH (hiatus hernia) 12/12/2011  . Hypertension   . Leukopenia 12/12/2011  . nhl dx'd 2010  . Seizures ([place]) reports "years ago"  . Syncope 12/12/2011   Hx seizures - not documented - age 64; recent syncope 6/12 and x2, 12/12; negative MRI brain 1/13    Past Surgical History:  Procedure Laterality Date  . COLON SURGERY      Allergies: Patient has no known allergies.  Medications: Prior to Admission medications   Medication Sig Start Date End Date Taking? Authorizing Provider  amLODipine (NORVASC) 10 MG tablet Take 10 mg by mouth daily.  11/02/11  Yes [provider]  cholecalciferol (VITAMIN D) 1000 UNITS tablet Take 1,000 Units by mouth daily.     Yes [provider]    Melatonin 5 MG TABS Take 5 mg by mouth daily.   Yes [provider]  omeprazole (PRILOSEC) 20 MG capsule Take 20 mg by mouth daily.    Yes [provider]  vitamin B-12 (CYANOCOBALAMIN) 1000 MCG tablet Take 1,000 mcg by mouth every other day.   Yes [provider]     No family history on file.  Social History   Socioeconomic History  . Marital status: Married    Spouse name: Not on file  . Number of children: Not on file  . Years of education: Not on file  . Highest education level: Not on file  Occupational History  . Not on file  Social Needs  . Financial resource strain: Not on file  . Food insecurity:    Worry: Not on file    Inability: Not on file  . Transportation needs:    Medical: Not on file    Non-medical: Not on file  Tobacco Use  . Smoking status: Never Smoker  Substance and Sexual Activity  . Alcohol use: No  . Drug use: No  . Sexual activity: Not on file  Lifestyle  . Physical activity:    Days per week: Not on file    Minutes per session: Not on file  . Stress: Not on file  Relationships  . Social connections:    Talks on phone: Not on file    Gets together: Not on file    Attends religious service: Not on file    Active member of club or organization: Not on file  Attends meetings of clubs or organizations: Not on file    Relationship status: Not on file  Other Topics Concern  . Not on file  Social History Narrative  . Not on file     Review of Systems: A 12 point ROS discussed and pertinent positives are indicated in the HPI above.  All other systems are negative.  Review of Systems  Constitutional: Negative for appetite change, chills, fever and unexpected weight change.  Respiratory: Negative for cough and shortness of breath.   Cardiovascular: Negative for chest pain.  Gastrointestinal: Negative for abdominal pain, diarrhea, nausea and vomiting.  Genitourinary: Negative for dysuria and hematuria.   Neurological: Negative for dizziness and syncope.  Psychiatric/Behavioral: Negative for confusion.    Vital Signs: BP (!) 146/69   Pulse 85   Temp 98.4 F (36.9 C)   Ht 5' 4.5" (1.638 m)   Wt 179 lb (81.2 kg)   SpO2 100%   BMI 30.25 kg/m   Physical Exam Vitals signs reviewed.  Constitutional:      General: She is not in acute distress.    Appearance: She is not ill-appearing.     Comments: Husband at bedside during exam.  HENT:     Head: Normocephalic.  Cardiovascular:     Rate and Rhythm: Normal rate and regular rhythm.  Pulmonary:     Effort: Pulmonary effort is normal.     Breath sounds: Normal breath sounds.  Abdominal:     General: There is no distension.     Palpations: Abdomen is soft.     Tenderness: There is no abdominal tenderness.  Skin:    General: Skin is warm and dry.  Neurological:     Mental Status: She is alert and oriented to person, place, and time.  Psychiatric:        Mood and Affect: Mood normal.        Behavior: Behavior normal.        Thought Content: Thought content normal.        Judgment: Judgment normal.      MD Evaluation Airway: WNL(Top and bottom dentures) Heart: WNL Abdomen: WNL Chest/ Lungs: WNL ASA  Classification: 2 Mallampati/Airway Score: Two   Imaging: Ct Abdomen Pelvis W Contrast  Result Date: 07/18/2018 CLINICAL DATA:  Right-sided abdominal lump for 2 weeks. Inguinal lymphadenitis. History of non-Hodgkin's lymphoma in 2011 with chemotherapy completed. EXAM: CT ABDOMEN AND PELVIS WITH CONTRAST TECHNIQUE: Multidetector CT imaging of the abdomen and pelvis was performed using the standard protocol following bolus administration of intravenous contrast. CONTRAST:  155mL ISOVUE-300 IOPAMIDOL (ISOVUE-300) INJECTION 61% COMPARISON:  09/22/2013 FINDINGS: Lower chest: Clear lung bases. Mild cardiomegaly, without pericardial or pleural effusion. A small fat containing left-sided Bochdalek's hernia. Hepatobiliary: Normal liver.  Cholecystectomy, without biliary ductal dilatation. Pancreas: Fatty replacement involving the pancreatic body and head. No duct dilatation. Spleen: Normal in size, without focal abnormality. Adrenals/Urinary Tract: Normal adrenal glands. Bilateral too small to characterize renal lesions. 2.6 cm left renal cyst. Normal urinary bladder. Stomach/Bowel: Tiny hiatal hernia. Surgical sutures within the sigmoid. Scattered colonic diverticula. Minimal low-density adjacent the distal descending colon on image 55/2 is similar to 2015 and likely the sequelae of prior diverticulitis. Normal small bowel. Vascular/Lymphatic: Advanced aortic and branch vessel atherosclerosis. Extensive abdominal adenopathy, with an index aortocaval node measuring 1.8 cm on image 48/2 versus 6 mm on the prior. Right pelvic sidewall adenopathy, with an index right external iliac nodal mass measuring 3.2 x 5.4 cm on image 73/2. Right  inguinal adenopathy, including at 4.4 x 4.0 cm on image 85/2, new. Reproductive: Hysterectomy.  No adnexal mass. Other: No significant free fluid. Moderate pelvic floor laxity. Musculoskeletal: No acute osseous abnormality. IMPRESSION: 1. Extensive abdominal-pelvic adenopathy, consistent with recurrent lymphoma. 2. Tiny hiatal hernia. 3. Pelvic floor laxity. Aortic Atherosclerosis (ICD10-I70.0). These results will be called to the ordering clinician or representative by the Radiologist Assistant, and communication documented in the PACS or zVision Dashboard. Electronically Signed   By: Abigail Miyamoto M.D.   On: 07/18/2018 10:03    Labs:  CBC: Recent Labs    08/06/18 1105  WBC 4.3  HGB 10.6*  HCT 34.0*  PLT 201    COAGS: Recent Labs    08/06/18 1105  INR 1.05    BMP: No results for input(s): NA, K, CL, CO2, GLUCOSE, BUN, CALCIUM, CREATININE, GFRNONAA, GFRAA in the last 8760 hours.  Invalid input(s): CMP  LIVER FUNCTION TESTS: No results for input(s): BILITOT, AST, ALT, ALKPHOS, PROT, ALBUMIN in  the last 8760 hours.  TUMOR MARKERS: No results for input(s): AFPTM, CEA, CA199, CHROMGRNA in the last 8760 hours.  Assessment and Plan:  Patient with history of non-Hodgkin's lymphoma s/p chemotherapy 2011 now with new onset abdominal/pelvic lymphadenoapthy per CT. Request for lymph node biopsy today in IR to further evaluate - patient has been approved for right inguinal lymph node biopsy.   Patient has been NPO since 5:30 am this morning, she does not take blood thinning medications. Afebrile, WBC 4.3, hgb 10.6, plt 201, INR  1.05. She requests biopsy with local anesthesia only if possible.  Risks and benefits discussed with the patient including, but not limited to bleeding, infection, damage to adjacent structures or low yield requiring additional tests.  All of the patient's questions were answered, patient is agreeable to proceed.  Consent signed and in chart.  Thank you for this interesting consult.  I greatly enjoyed meeting Custar and look forward to participating in their care.  A copy of this report was sent to the requesting provider on this date.  Electronically Signed: Joaquim Nam, PA-C 08/06/2018, 1:07 PM   I spent a total of 30 Minutes   in face to face in clinical consultation, greater than 50% of which was counseling/coordinating care for lymph node biopsy.

## 2018-08-12 NOTE — Progress Notes (Signed)
HEMATOLOGY/ONCOLOGY CONSULTATION NOTE  Date of Service: 08/13/2018  Patient Care Team: [name], MD as PCP - General (Internal Medicine)  CHIEF COMPLAINTS/PURPOSE OF CONSULTATION:  Large B-Cell Lymphoma  Oncologic History:  EDOM SCHMUHL presented with extranodal involvement in the terminal ileum s/p gross tumor resection in November 2010, and was diagnosed with High Grade B-Cell Non-Hodgkin's Lymphoma. She was treated with 4 cycles of R-CHOP between 07/05/09 and 09/06/09.  HISTORY OF PRESENTING ILLNESS:   [name] is a wonderful 78 y.o. female who has been referred to [place] by Dr. Lawerance Cruel for evaluation and management of Large B-Cell Lymphoma. She is accompanied today by her daugher. The pt reports that she is doing well overall.  The pt reports that she found a lump in her right groin just before 07/17/18. She was evaluated by her PCP on 07/18/18 with a CT A/P, as noted below. She denies this lump changing much since she first noticed it, and endorses some soreness. The pt denies fevers, chills, night sweats or unexpected weight loss. Prior to this, the patient denies any changes in her quality of life or ability to function. She notes that her right leg became swollen yesterday as well. She denies changes in her bowel movements, abdominal pains, and left leg swelling. She denies any problems passing urine nor blood in the urine. She denies heart problems or recent chest pain or SOB. She denies changes in vision or headaches.   The pt has been eating well and endorses good appetite.   The pt takes Amlodipine every other day. She takes 6m Prilosec every day, as well as Vitamin B12 replacement. She denies stroke history, lung problems, nor heart problems. She notes a history of minor seizures, which she hasn't had since she began B12 replacement several years ago. The pt has never smoked cigarettes. She notes that she does not need help with any daily  activities and lives with her husband who also functions independently. She attends a local gym regularly.   Of note prior to the patient's visit today, pt has had a CT A/P completed on 07/18/18 with results revealing Extensive abdominal-pelvic adenopathy, consistent with recurrent lymphoma. 2. Tiny hiatal hernia. 3. Pelvic floor laxity. Aortic Atherosclerosis.  Most recent lab results (08/06/18) of CBC is as follows: all values are WNL except for RBC at 3.75, HGB at 10.6, HCT at 34.0  On review of systems, pt reports sore right inguinal lump, right leg swelling, good energy levels, sore right thigh, and denies changes in vision, headaches, abdominal pains, changes in bowel habits, problems passing urine, blood in the urine, left leg swelling, fevers, chills, night sweats, noticing other lumps or bumps, back pain, pain along the spine, calf pain, and any other symptoms.   On PMHx the pt reports High Grade B-Cell Non-Hodgkin's lymphoma in 2010, HTN, Vitamin B12 deficiency, minor seizures, GERD. On Social Hx the pt denies chemical exposure.  MEDICAL HISTORY:  Past Medical History:  Diagnosis Date  . GERD (gastroesophageal reflux disease)   . HH (hiatus hernia) 12/12/2011  . Hypertension   . Leukopenia 12/12/2011  . nhl dx'd 2010  . Seizures ([facility] reports "years ago"  . Syncope 12/12/2011   Hx seizures - not documented - age 78 recent syncope 6/12 and x2, 12/12; negative MRI brain 1/13    SURGICAL HISTORY: Past Surgical History:  Procedure Laterality Date  . COLON SURGERY      SOCIAL HISTORY: Social History   Socioeconomic History  .  Marital status: Married    Spouse name: Not on file  . Number of children: Not on file  . Years of education: Not on file  . Highest education level: Not on file  Occupational History  . Not on file  Social Needs  . Financial resource strain: Not on file  . Food insecurity:    Worry: Not on file    Inability: Not on file  . Transportation needs:      Medical: Not on file    Non-medical: Not on file  Tobacco Use  . Smoking status: Never Smoker  Substance and Sexual Activity  . Alcohol use: No  . Drug use: No  . Sexual activity: Not on file  Lifestyle  . Physical activity:    Days per week: Not on file    Minutes per session: Not on file  . Stress: Not on file  Relationships  . Social connections:    Talks on phone: Not on file    Gets together: Not on file    Attends religious service: Not on file    Active member of club or organization: Not on file    Attends meetings of clubs or organizations: Not on file    Relationship status: Not on file  . Intimate partner violence:    Fear of current or ex partner: Not on file    Emotionally abused: Not on file    Physically abused: Not on file    Forced sexual activity: Not on file  Other Topics Concern  . Not on file  Social History Narrative  . Not on file    FAMILY HISTORY: No family history on file.  ALLERGIES:  has No Known Allergies.  MEDICATIONS:  Current Outpatient Medications  Medication Sig Dispense Refill  . amLODipine (NORVASC) 10 MG tablet Take 10 mg by mouth daily.     . cholecalciferol (VITAMIN D) 1000 UNITS tablet Take 1,000 Units by mouth daily.      . Melatonin 5 MG TABS Take 5 mg by mouth daily.    Marland Kitchen omeprazole (PRILOSEC) 20 MG capsule Take 20 mg by mouth daily.     . vitamin B-12 (CYANOCOBALAMIN) 1000 MCG tablet Take 1,000 mcg by mouth every other day.     No current facility-administered medications for this visit.     REVIEW OF SYSTEMS:    10 Point review of Systems was done is negative except as noted above.  PHYSICAL EXAMINATION: ECOG PERFORMANCE STATUS: 1 - Symptomatic but completely ambulatory  . Vitals:   08/13/18 1110  BP: (!) 141/75  Pulse: 87  Resp: 18  Temp: 98 F (36.7 C)  SpO2: 99%   Filed Weights   08/13/18 1110  Weight: 188 lb 1.6 oz (85.3 kg)   .Body mass index is 31.79 kg/m.  GENERAL:alert, in no acute  distress and comfortable SKIN: no acute rashes, no significant lesions EYES: conjunctiva are pink and non-injected, sclera anicteric OROPHARYNX: MMM, no exudates, no oropharyngeal erythema or ulceration NECK: supple, no JVD LYMPH: Right inguinal 4-6cm LN, no palpable lymphadenopathy in the cervical axillary regions LUNGS: clear to auscultation b/l with normal respiratory effort HEART: regular rate & rhythm ABDOMEN:  normoactive bowel sounds , non tender, not distended. Extremity: 2+ right pedal edema, no left pedal edema  PSYCH: alert & oriented x 3 with fluent speech NEURO: no focal motor/sensory deficits  LABORATORY DATA:  I have reviewed the data as listed  . CBC Latest Ref Rng & Units 08/06/2018 09/13/2016  09/12/2015  WBC 4.0 - 10.5 K/uL 4.3 - 3.2(L)  Hemoglobin 12.0 - 15.0 g/dL 10.6(L) 12.6 11.4(L)  Hematocrit 36.0 - 46.0 % 34.0(L) 37.0 36.1  Platelets 150 - 400 K/uL 201 - 233    . CMP Latest Ref Rng & Units 09/13/2016 09/12/2015 09/22/2013  Glucose 65 - 99 mg/dL 91 92 91  BUN 6 - 20 mg/dL 15 12 13.5  Creatinine 0.44 - 1.00 mg/dL 0.90 0.85 0.8  Sodium 135 - 145 mmol/L 144 137 140  Potassium 3.5 - 5.1 mmol/L 3.7 3.4(L) 3.5  Chloride 101 - 111 mmol/L 106 105 -  CO2 22 - 32 mmol/L - 21(L) 27  Calcium 8.9 - 10.3 mg/dL - 8.9 9.5  Total Protein 6.5 - 8.1 g/dL - 6.6 7.6  Total Bilirubin 0.3 - 1.2 mg/dL - 0.4 0.40  Alkaline Phos 38 - 126 U/L - 65 85  AST 15 - 41 U/L - 18 20  ALT 14 - 54 U/L - 16 22   08/06/18 Biopsy:    06/01/09 Pathology:     07/18/18 CBC w/diff:    RADIOGRAPHIC STUDIES: I have personally reviewed the radiological images as listed and agreed with the findings in the report. Ct Abdomen Pelvis W Contrast  Result Date: 07/18/2018 CLINICAL DATA:  Right-sided abdominal lump for 2 weeks. Inguinal lymphadenitis. History of non-Hodgkin's lymphoma in 2011 with chemotherapy completed. EXAM: CT ABDOMEN AND PELVIS WITH CONTRAST TECHNIQUE: Multidetector CT imaging  of the abdomen and pelvis was performed using the standard protocol following bolus administration of intravenous contrast. CONTRAST:  118m ISOVUE-300 IOPAMIDOL (ISOVUE-300) INJECTION 61% COMPARISON:  09/22/2013 FINDINGS: Lower chest: Clear lung bases. Mild cardiomegaly, without pericardial or pleural effusion. A small fat containing left-sided Bochdalek's hernia. Hepatobiliary: Normal liver. Cholecystectomy, without biliary ductal dilatation. Pancreas: Fatty replacement involving the pancreatic body and head. No duct dilatation. Spleen: Normal in size, without focal abnormality. Adrenals/Urinary Tract: Normal adrenal glands. Bilateral too small to characterize renal lesions. 2.6 cm left renal cyst. Normal urinary bladder. Stomach/Bowel: Tiny hiatal hernia. Surgical sutures within the sigmoid. Scattered colonic diverticula. Minimal low-density adjacent the distal descending colon on image 55/2 is similar to 2015 and likely the sequelae of prior diverticulitis. Normal small bowel. Vascular/Lymphatic: Advanced aortic and branch vessel atherosclerosis. Extensive abdominal adenopathy, with an index aortocaval node measuring 1.8 cm on image 48/2 versus 6 mm on the prior. Right pelvic sidewall adenopathy, with an index right external iliac nodal mass measuring 3.2 x 5.4 cm on image 73/2. Right inguinal adenopathy, including at 4.4 x 4.0 cm on image 85/2, new. Reproductive: Hysterectomy.  No adnexal mass. Other: No significant free fluid. Moderate pelvic floor laxity. Musculoskeletal: No acute osseous abnormality. IMPRESSION: 1. Extensive abdominal-pelvic adenopathy, consistent with recurrent lymphoma. 2. Tiny hiatal hernia. 3. Pelvic floor laxity. Aortic Atherosclerosis (ICD10-I70.0). These results will be called to the ordering clinician or representative by the Radiologist Assistant, and communication documented in the PACS or zVision Dashboard. Electronically Signed   By: KAbigail MiyamotoM.D.   On: 07/18/2018 10:03    [place] Biopsy (lymph Nodes)  Result Date: 08/06/2018 INDICATION: History of lymphoma. Adenopathy including right inguinal adenopathy. EXAM: ULTRASOUND GUIDED CORE BIOPSY OF RIGHT INGUINAL ADENOPATHY MEDICATIONS: Lidocaine 1% subcutaneous ANESTHESIA/SEDATION: Patient declined sedation PROCEDURE: The procedure, risks, benefits, and alternatives were explained to the patient. Questions regarding the procedure were encouraged and answered. The patient understands and consents to the procedure. Survey ultrasound of the right inguinal region performed. The adenopathy was localized and an appropriate skin  entry site determined and marked. The operative field was prepped with chlorhexidine in a sterile fashion, and a sterile drape was applied covering the operative field. A sterile gown and sterile gloves were used for the procedure. Local anesthesia was provided with 1% Lidocaine. Under real-time ultrasound guidance, a 17 gauge trocar needle was advanced to the margin of the lesion. Once needle tip position was confirmed, coaxial 18-gauge core biopsy samples were obtained, submitted in saline to surgical pathology. The guide needle was removed. Follow-up scan shows no hemorrhage or other apparent complication. The patient tolerated the procedure well. COMPLICATIONS: None immediate. FINDINGS: Right inguinal adenopathy was localized. Representative core biopsy samples obtained as above. IMPRESSION: 1. Technically successful ultrasound-guided core biopsy, right inguinal adenopathy. Electronically Signed   By: Lucrezia Europe M.D.   On: 08/06/2018 16:55    ASSESSMENT & PLAN:   78 y.o. female with  1. History of High Grade B-Cell Non-Hodgkin's Lymphoma Presented with extranodal involvement in the terminal ileum s/p gross tumor resection in November 2010 Treated with 4 cycles of R-CHOP between 07/05/09 and 09/06/09  2. Newly Diagnosed Recurrent/New High Grade B-Cell Non-Hodgkin's Lymphoma  3. RLE swelling r/o DVT.  Could be from lymphedema related to her new/recurrent NHL PLAN: -Discussed patient's most recent labs from 08/06/18, WBC normal at 4.3k, HGB at 10.6, PLT normal at 201k -Discussed the 07/18/18 CT A/P which revealed Extensive abdominal-pelvic adenopathy, consistent with recurrent lymphoma. 2. Tiny hiatal hernia. 3. Pelvic floor laxity. Aortic Atherosclerosis. -Discussed the 08/06/18 Right Inguinal LN Biopsy which revealed Large B-Cell Lymphoma with a Ki67 of 90%. Further genetic characterization is pending. 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) -Discussed that the diagnosis confirms an aggressive behaving lymphoma and necessitates initiating treatment now  -Discussed that there are lifetime limits to some of the chemotherapy medications that the pt has previously received 10 years ago, and will be considered in current treatment planning -Will support pt with G-CSF -Will order PET/CT for accurate staging and treatment planning -Will order ECHO for treatment planning -Will refer the pt to IR for port placement -Will order blood tests today -Will order US Venous RLE to rule out DVT - reviewed - no VTE -Will see the pt back in 10 days   Labs today PET/CT ASAP in 3-4 days ECHO in 3-4 days Port a cath placement in 3-4 days [place] rt LE venous stat today to r/o DVT RTC with dr Irene Limbo in 10 days    All of the patients questions were answered with apparent satisfaction. The patient knows to call the clinic with any problems, questions or concerns.  The total time spent in the appt was 60 minutes and more than 50% was on counseling and direct patient cares.    Sullivan Lone MD MS AAHIVMS [facility] Hematology/Oncology Physician [facility]  (Office):        [phone] (Work cell):  [phone] (Fax):           [phone]  08/13/2018 12:00 PM  I, Baldwin [place], am acting as a scribe for Dr. Sullivan Lone.   .I have reviewed the above documentation for accuracy and completeness, and I agree with the above. Brunetta Genera MD

## 2018-08-13 ENCOUNTER — Ambulatory Visit (HOSPITAL_COMMUNITY)
Admission: RE | Admit: 2018-08-13 | Discharge: 2018-08-13 | Disposition: A | Discharge status: Home / Self Care | Payer: Medicare Other | Source: Ambulatory Visit | Attending: Hematology | Admitting: Hematology

## 2018-08-13 ENCOUNTER — Telehealth: Payer: Self-pay | Admitting: Hematology

## 2018-08-13 ENCOUNTER — Inpatient Hospital Stay: Payer: Medicare Other | Attending: Hematology | Admitting: Hematology

## 2018-08-13 ENCOUNTER — Inpatient Hospital Stay: Payer: Medicare Other

## 2018-08-13 VITALS — BP 141/75 | HR 87 | Temp 98.0°F | Resp 18 | Ht 64.5 in | Wt 188.1 lb

## 2018-08-13 DIAGNOSIS — R569 Unspecified convulsions: Secondary | ICD-10-CM | POA: Diagnosis not present

## 2018-08-13 DIAGNOSIS — K219 Gastro-esophageal reflux disease without esophagitis: Secondary | ICD-10-CM | POA: Insufficient documentation

## 2018-08-13 DIAGNOSIS — C8338 Diffuse large B-cell lymphoma, lymph nodes of multiple sites: Secondary | ICD-10-CM

## 2018-08-13 DIAGNOSIS — M7989 Other specified soft tissue disorders: Secondary | ICD-10-CM

## 2018-08-13 DIAGNOSIS — I700 Atherosclerosis of aorta: Secondary | ICD-10-CM | POA: Diagnosis not present

## 2018-08-13 DIAGNOSIS — E538 Deficiency of other specified B group vitamins: Secondary | ICD-10-CM | POA: Diagnosis not present

## 2018-08-13 DIAGNOSIS — D72819 Decreased white blood cell count, unspecified: Secondary | ICD-10-CM

## 2018-08-13 DIAGNOSIS — K449 Diaphragmatic hernia without obstruction or gangrene: Secondary | ICD-10-CM

## 2018-08-13 DIAGNOSIS — Z79899 Other long term (current) drug therapy: Secondary | ICD-10-CM | POA: Insufficient documentation

## 2018-08-13 DIAGNOSIS — I10 Essential (primary) hypertension: Secondary | ICD-10-CM | POA: Diagnosis not present

## 2018-08-13 LAB — CBC WITH DIFFERENTIAL/PLATELET
Abs Immature Granulocytes: 0.01 10*3/uL (ref 0.00–0.07)
Basophils Absolute: 0 10*3/uL (ref 0.0–0.1)
Basophils Relative: 1 %
EOS ABS: 0.1 10*3/uL (ref 0.0–0.5)
EOS PCT: 4 %
HCT: 33.5 % — ABNORMAL LOW (ref 36.0–46.0)
Hemoglobin: 10.5 g/dL — ABNORMAL LOW (ref 12.0–15.0)
Immature Granulocytes: 0 %
Lymphocytes Relative: 23 %
Lymphs Abs: 0.8 10*3/uL (ref 0.7–4.0)
MCH: 27.6 pg (ref 26.0–34.0)
MCHC: 31.3 g/dL (ref 30.0–36.0)
MCV: 88.2 fL (ref 80.0–100.0)
Monocytes Absolute: 0.6 10*3/uL (ref 0.1–1.0)
Monocytes Relative: 18 %
NRBC: 0 % (ref 0.0–0.2)
Neutro Abs: 1.9 10*3/uL (ref 1.7–7.7)
Neutrophils Relative %: 54 %
Platelets: 198 10*3/uL (ref 150–400)
RBC: 3.8 MIL/uL — ABNORMAL LOW (ref 3.87–5.11)
RDW: 14.5 % (ref 11.5–15.5)
WBC: 3.4 10*3/uL — ABNORMAL LOW (ref 4.0–10.5)

## 2018-08-13 LAB — CMP (CANCER CENTER ONLY)
ALT: 13 U/L (ref 0–44)
AST: 14 U/L — ABNORMAL LOW (ref 15–41)
Albumin: 3.8 g/dL (ref 3.5–5.0)
Alkaline Phosphatase: 78 U/L (ref 38–126)
Anion gap: 11 (ref 5–15)
BUN: 12 mg/dL (ref 8–23)
CO2: 23 mmol/L (ref 22–32)
CREATININE: 0.76 mg/dL (ref 0.44–1.00)
Calcium: 9.2 mg/dL (ref 8.9–10.3)
Chloride: 108 mmol/L (ref 98–111)
GFR, Est AFR Am: >60 mL/min (ref >60)
GFR, Estimated: >60 mL/min (ref >60)
Glucose, Bld: 92 mg/dL (ref 70–99)
Potassium: 3.5 mmol/L (ref 3.5–5.1)
SODIUM: 142 mmol/L (ref 135–145)
Total Bilirubin: 0.4 mg/dL (ref 0.3–1.2)
Total Protein: 7.3 g/dL (ref 6.5–8.1)

## 2018-08-13 LAB — LACTATE DEHYDROGENASE: LDH: 281 U/L — ABNORMAL HIGH (ref 98–192)

## 2018-08-13 NOTE — Progress Notes (Signed)
Right lower extremity venous duplex has been completed. Negative for DVT. There is evidence of a large, mix echogenic area in the right groin measuring 4.9 cm high by greater than 4.6 cm wide by greater than 5.8 cm long.  Results were faxed to Dr. Irene Limbo.  08/13/18 4:14 PM Taylor Huynh RVT

## 2018-08-13 NOTE — Telephone Encounter (Signed)
Scheduled appts per 01/21 los. °Printed calendar and avs. °

## 2018-08-14 ENCOUNTER — Telehealth: Payer: Self-pay | Admitting: *Deleted

## 2018-08-14 LAB — HEPATITIS B SURFACE ANTIGEN: Hepatitis B Surface Ag: NEGATIVE

## 2018-08-14 LAB — HEPATITIS B CORE ANTIBODY, TOTAL: Hep B Core Total Ab: NEGATIVE

## 2018-08-14 LAB — HEPATITIS C ANTIBODY

## 2018-08-14 NOTE — Telephone Encounter (Signed)
Patient called. Said she left here yesterday and went to [facility] and had that test done and it said she didn't have a clot in her legs. Since she didn't, she wanted to know if the doctor was going to prescribe medicine to take down the swelling in her legs. Informed her that Dr. Irene Limbo will be given the message and the office will let her know about medication prescribed. Patient verbalized understanding.

## 2018-08-15 ENCOUNTER — Telehealth: Payer: Self-pay | Admitting: *Deleted

## 2018-08-15 ENCOUNTER — Other Ambulatory Visit: Payer: Self-pay | Admitting: Hematology

## 2018-08-15 MED ORDER — DEXAMETHASONE 4 MG PO TABS: 4.0000 mg | ORAL_TABLET | Freq: Every day | ORAL | 0 refills | Status: AC

## 2018-08-15 MED ORDER — POTASSIUM CHLORIDE CRYS ER 20 MEQ PO TBCR: 20.0000 meq | EXTENDED_RELEASE_TABLET | Freq: Two times a day (BID) | ORAL | 0 refills | Status: DC

## 2018-08-15 MED ORDER — FUROSEMIDE 20 MG PO TABS: 20.0000 mg | ORAL_TABLET | Freq: Every day | ORAL | 0 refills | Status: DC

## 2018-08-15 NOTE — Telephone Encounter (Signed)
Notified patient that Dr. Irene Limbo had sent dexamethasone, lasix, and potassium to her pharmacy and reviewed purpose of each medication. Patient verbalized appreciation for information and understanding.

## 2018-08-21 ENCOUNTER — Ambulatory Visit (HOSPITAL_COMMUNITY)
Admission: RE | Admit: 2018-08-21 | Discharge: 2018-08-21 | Disposition: A | Discharge status: Home / Self Care | Payer: Medicare Other | Source: Ambulatory Visit | Attending: Hematology | Admitting: Hematology

## 2018-08-21 DIAGNOSIS — R55 Syncope and collapse: Secondary | ICD-10-CM | POA: Insufficient documentation

## 2018-08-21 DIAGNOSIS — C8338 Diffuse large B-cell lymphoma, lymph nodes of multiple sites: Secondary | ICD-10-CM | POA: Insufficient documentation

## 2018-08-21 NOTE — Progress Notes (Signed)
  Echocardiogram 2D Echocardiogram has been performed.  Kimmora Risenhoover L Androw 08/21/2018, 10:40 AM

## 2018-08-22 ENCOUNTER — Ambulatory Visit (HOSPITAL_COMMUNITY)
Admission: RE | Admit: 2018-08-22 | Discharge: 2018-08-22 | Disposition: A | Discharge status: Home / Self Care | Payer: Medicare Other | Source: Ambulatory Visit | Attending: Hematology | Admitting: Hematology

## 2018-08-22 ENCOUNTER — Other Ambulatory Visit: Payer: Self-pay | Admitting: Student

## 2018-08-22 DIAGNOSIS — C8338 Diffuse large B-cell lymphoma, lymph nodes of multiple sites: Secondary | ICD-10-CM | POA: Diagnosis not present

## 2018-08-22 LAB — GLUCOSE, CAPILLARY: Glucose-Capillary: 84 mg/dL (ref 70–99)

## 2018-08-22 MED ORDER — FLUDEOXYGLUCOSE F - 18 (FDG) INJECTION
10.0100 | Freq: Once | INTRAVENOUS | Status: AC | PRN
  Administered 2018-08-22: 10.01 via INTRAVENOUS

## 2018-08-23 ENCOUNTER — Encounter (HOSPITAL_COMMUNITY): Payer: Self-pay

## 2018-08-23 ENCOUNTER — Ambulatory Visit (HOSPITAL_COMMUNITY)
Admission: RE | Admit: 2018-08-23 | Discharge: 2018-08-23 | Disposition: A | Discharge status: Home / Self Care | Payer: Medicare Other | Source: Ambulatory Visit | Attending: Hematology | Admitting: Hematology

## 2018-08-23 DIAGNOSIS — C8338 Diffuse large B-cell lymphoma, lymph nodes of multiple sites: Secondary | ICD-10-CM | POA: Insufficient documentation

## 2018-08-23 DIAGNOSIS — Z79899 Other long term (current) drug therapy: Secondary | ICD-10-CM | POA: Diagnosis not present

## 2018-08-23 DIAGNOSIS — I10 Essential (primary) hypertension: Secondary | ICD-10-CM | POA: Insufficient documentation

## 2018-08-23 DIAGNOSIS — K219 Gastro-esophageal reflux disease without esophagitis: Secondary | ICD-10-CM | POA: Diagnosis not present

## 2018-08-23 HISTORY — PX: IR IMAGING GUIDED PORT INSERTION: IMG5740

## 2018-08-23 LAB — CBC
HCT: 35.4 % — ABNORMAL LOW (ref 36.0–46.0)
Hemoglobin: 11 g/dL — ABNORMAL LOW (ref 12.0–15.0)
MCH: 27.9 pg (ref 26.0–34.0)
MCHC: 31.1 g/dL (ref 30.0–36.0)
MCV: 89.8 fL (ref 80.0–100.0)
Platelets: 266 K/uL (ref 150–400)
RBC: 3.94 MIL/uL (ref 3.87–5.11)
RDW: 15 % (ref 11.5–15.5)
WBC: 5.3 K/uL (ref 4.0–10.5)
nRBC: 0 % (ref 0.0–0.2)

## 2018-08-23 LAB — PROTIME-INR
INR: 0.99
Prothrombin Time: 13 seconds (ref 11.4–15.2)

## 2018-08-23 LAB — APTT: aPTT: 22 s — ABNORMAL LOW (ref 24–36)

## 2018-08-23 MED ORDER — MIDAZOLAM HCL 2 MG/2ML IJ SOLN
INTRAMUSCULAR | Status: AC | PRN
  Administered 2018-08-23: 1 mg via INTRAVENOUS
  Administered 2018-08-23 (×2): 0.5 mg via INTRAVENOUS

## 2018-08-23 MED ORDER — MIDAZOLAM HCL 2 MG/2ML IJ SOLN
INTRAMUSCULAR | Status: AC
  Filled 2018-08-23: qty 2

## 2018-08-23 MED ORDER — HEPARIN SOD (PORK) LOCK FLUSH 100 UNIT/ML IV SOLN
INTRAVENOUS | Status: AC
  Filled 2018-08-23: qty 5

## 2018-08-23 MED ORDER — CEFAZOLIN SODIUM-DEXTROSE 2-4 GM/100ML-% IV SOLN
INTRAVENOUS | Status: AC
  Filled 2018-08-23: qty 100

## 2018-08-23 MED ORDER — LIDOCAINE HCL 1 % IJ SOLN
INTRAMUSCULAR | Status: AC
  Filled 2018-08-23: qty 20

## 2018-08-23 MED ORDER — FENTANYL CITRATE (PF) 100 MCG/2ML IJ SOLN
INTRAMUSCULAR | Status: AC
  Filled 2018-08-23: qty 2

## 2018-08-23 MED ORDER — LIDOCAINE HCL (PF) 1 % IJ SOLN
INTRAMUSCULAR | Status: AC | PRN
  Administered 2018-08-23: 10 mL

## 2018-08-23 MED ORDER — LIDOCAINE-EPINEPHRINE (PF) 2 %-1:200000 IJ SOLN
INTRAMUSCULAR | Status: AC
  Filled 2018-08-23: qty 20

## 2018-08-23 MED ORDER — FENTANYL CITRATE (PF) 100 MCG/2ML IJ SOLN
INTRAMUSCULAR | Status: AC | PRN
  Administered 2018-08-23: 50 ug via INTRAVENOUS
  Administered 2018-08-23 (×2): 25 ug via INTRAVENOUS

## 2018-08-23 MED ORDER — CEFAZOLIN SODIUM-DEXTROSE 2-4 GM/100ML-% IV SOLN
2.0000 g | Freq: Once | INTRAVENOUS | Status: AC
  Administered 2018-08-23: 2 g via INTRAVENOUS

## 2018-08-23 MED ORDER — HEPARIN SOD (PORK) LOCK FLUSH 100 UNIT/ML IV SOLN
INTRAVENOUS | Status: AC | PRN
  Administered 2018-08-23: 500 [IU]

## 2018-08-23 MED ORDER — SODIUM CHLORIDE 0.9 % IV SOLN
INTRAVENOUS | Status: DC
  Administered 2018-08-23: 10:00:00 via INTRAVENOUS

## 2018-08-23 MED ORDER — LIDOCAINE-EPINEPHRINE (PF) 1 %-1:200000 IJ SOLN
INTRAMUSCULAR | Status: AC | PRN
  Administered 2018-08-23: 10 mL

## 2018-08-23 NOTE — Procedures (Signed)
Interventional Radiology Procedure:   Indications: Large B cell lymphoma  Procedure: Port placement  Findings: Right jugular port, tip at SVC/RA junction  Complications: None     EBL: Minimal  Plan: Discharge to home in one hour.    Takya Vandivier R. Anselm Pancoast, MD  Pager: [phone]

## 2018-08-23 NOTE — H&P (Signed)
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 She is referred for port placement PMHx, meds, labs, imaging, allergies reviewed. Feels well, no recent fevers, chills, illness. Has been NPO today as directed. Family at bedside.   Past Medical History:  Diagnosis Date  . GERD (gastroesophageal reflux disease)   . HH (hiatus hernia) 12/12/2011  . Hypertension   . Leukopenia 12/12/2011  . nhl dx'd 2010  . Seizures ([place]) reports "years ago"  . Syncope 12/12/2011   Hx seizures - not documented - age 51; recent syncope 6/12 and x2, 12/12; negative MRI brain 1/13    Past Surgical History:  Procedure Laterality Date  . COLON SURGERY      Allergies: Patient has no known allergies.  Medications: Prior to Admission medications   Medication Sig Start Date End Date Taking? Authorizing Provider  amLODipine (NORVASC) 10 MG tablet Take 10 mg by mouth daily.  11/02/11  Yes [provider]  cholecalciferol (VITAMIN D) 1000 UNITS tablet Take 1,000 Units by mouth daily.     Yes [provider]  dexamethasone (DECADRON) 4 MG tablet Take 1 tablet (4 mg total) by mouth daily with breakfast for 10 days. 08/15/18 08/25/18 Yes Brunetta Genera, MD  furosemide (LASIX) 20 MG tablet Take 1 tablet (20 mg total) by mouth daily. Hold if you are dizzy. 08/15/18  Yes Brunetta Genera, MD  Melatonin 5 MG TABS Take 5 mg by mouth daily.   Yes [provider]  omeprazole (PRILOSEC) 20 MG capsule Take 20 mg by mouth daily.    Yes [provider]  potassium chloride SA (K-DUR,KLOR-CON) 20 MEQ tablet Take 1 tablet (20 mEq total) by mouth 2 (two) times daily. 08/15/18  Yes Brunetta Genera, MD  vitamin  B-12 (CYANOCOBALAMIN) 1000 MCG tablet Take 1,000 mcg by mouth every other day.   Yes [provider]     History reviewed. No pertinent family history.  Social History   Socioeconomic History  . Marital status: Married    Spouse name: Not on file  . Number of children: Not on file  . Years of education: Not on file  . Highest education level: Not on file  Occupational History  . Not on file  Social Needs  . Financial resource strain: Not on file  . Food insecurity:    Worry: Not on file    Inability: Not on file  . Transportation needs:    Medical: Not on file    Non-medical: Not on file  Tobacco Use  . Smoking status: Never Smoker  . Smokeless tobacco: Never Used  Substance and Sexual Activity  . Alcohol use: No  . Drug use: No  . Sexual activity: Not on file  Lifestyle  . Physical activity:    Days per week: Not on file    Minutes per session: Not on file  . Stress: Not on file  Relationships  . Social connections:    Talks on phone: Not on file    Gets together: Not on file    Attends religious service: Not on file    Active member of club or organization: Not on file    Attends meetings of clubs or organizations: Not on file    Relationship  status: Not on file  Other Topics Concern  . Not on file  Social History Narrative  . Not on file     Review of Systems: A 12 point ROS discussed and pertinent positives are indicated in the HPI above.  All other systems are negative.  Review of Systems  Vital Signs: BP (!) 148/100 (BP Location: Right Arm)   Pulse 96   Temp 98.2 F (36.8 C) (Oral)   Resp 18   Ht 5' 4.5" (1.638 m)   Wt 85.3 kg   LMP  (LMP Unknown)   SpO2 100%   BMI 31.79 kg/m   Physical Exam Constitutional:      Appearance: Normal appearance.  HENT:     Mouth/Throat:     Mouth: Mucous membranes are moist.     Pharynx: Oropharynx is clear.  Cardiovascular:     Rate and Rhythm: Normal rate and regular rhythm.     Heart sounds:  Normal heart sounds.  Pulmonary:     Effort: Pulmonary effort is normal.     Breath sounds: Normal breath sounds.  Skin:    General: Skin is warm and dry.  Neurological:     General: No focal deficit present.     Mental Status: She is alert and oriented to person, place, and time.  Psychiatric:        Mood and Affect: Mood normal.        Judgment: Judgment normal.       Imaging: Nm Pet Image Initial (pi) Skull Base To Thigh  Result Date: 08/22/2018 CLINICAL DATA:  Subsequent treatment strategy for restaging of B-cell lymphoma. Initially diagnosed with lymphoma in 2011. EXAM: NUCLEAR MEDICINE PET SKULL BASE TO THIGH TECHNIQUE: 10.0 mCi F-18 FDG was injected intravenously. Full-ring PET imaging was performed from the skull base to thigh after the radiotracer. CT data was obtained and used for attenuation correction and anatomic localization. Fasting blood glucose: 84 mg/dl COMPARISON:  09/22/2009 PET.  07/18/2018 abdominopelvic CT FINDINGS: Mediastinal blood pool activity: SUV max 2.3 NECK: No areas of abnormal hypermetabolism. Incidental CT findings: Bilateral carotid atherosclerosis. No cervical adenopathy. CHEST: No pulmonary parenchymal or thoracic nodal hypermetabolism. Incidental CT findings: Mild cardiomegaly. Aortic atherosclerosis. Tiny hiatal hernia. Small bilateral thyroid nodules are nonspecific. No thoracic adenopathy. Inferior left-sided pleural fat proliferation versus small left-sided Bochdalek's hernia. ABDOMEN/PELVIS: Hypermetabolism corresponding to the previously described abdominopelvic adenopathy. Left periaortic adenopathy at up to 2.2 cm and a S.U.V. max of 35.3 on image 128/4. This node is increased from 1.3 cm on the prior. Right external iliac adenopathy measures 4.4 cm and a S.U.V. max of 39.3 on image 167/4. Compare 3.2 cm on the prior diagnostic CT. Right inguinal node measures 5.4 x 5.6 cm and a S.U.V. max of 43.7 on image 185/4. Enlarged from 4.4 x 4.0 cm on  07/18/2018. Focus of hypermetabolism within the anus is without CT correlate. Incidental CT findings: Abdominal aortic atherosclerosis. Moderate hepatic steatosis. Cholecystectomy. Left renal cyst. Pelvic floor laxity. SKELETON: Focus of eighth right lateral rib hypermetabolism is without CT correlate, including at a S.U.V. max of 8.8 on image 91/4. A left transverse process focus of hypermetabolism measures a S.U.V. max of 6.1 at approximately the T6 level. Incidental CT findings: none IMPRESSION: 1. Progression, since 20/25/4270, of hypermetabolic abdominopelvic adenopathy, consistent with active lymphoma. 2. CT occult hypermetabolic osseous foci, also most consistent with active lymphoma. 3. No evidence of soft tissue disease above the diaphragm. 4. Focus of hypermetabolism within the anus is  likely physiologic. Consider physical exam correlation. 5.  Aortic Atherosclerosis (ICD10-I70.0). Electronically Signed   By: Abigail Miyamoto M.D.   On: 08/22/2018 11:56   [place] Core Biopsy (lymph Nodes)  Result Date: 08/06/2018 INDICATION: History of lymphoma. Adenopathy including right inguinal adenopathy. EXAM: ULTRASOUND GUIDED CORE BIOPSY OF RIGHT INGUINAL ADENOPATHY MEDICATIONS: Lidocaine 1% subcutaneous ANESTHESIA/SEDATION: Patient declined sedation PROCEDURE: The procedure, risks, benefits, and alternatives were explained to the patient. Questions regarding the procedure were encouraged and answered. The patient understands and consents to the procedure. Survey ultrasound of the right inguinal region performed. The adenopathy was localized and an appropriate skin entry site determined and marked. The operative field was prepped with chlorhexidine in a sterile fashion, and a sterile drape was applied covering the operative field. A sterile gown and sterile gloves were used for the procedure. Local anesthesia was provided with 1% Lidocaine. Under real-time ultrasound guidance, a 17 gauge trocar needle was advanced to the  margin of the lesion. Once needle tip position was confirmed, coaxial 18-gauge core biopsy samples were obtained, submitted in saline to surgical pathology. The guide needle was removed. Follow-up scan shows no hemorrhage or other apparent complication. The patient tolerated the procedure well. COMPLICATIONS: None immediate. FINDINGS: Right inguinal adenopathy was localized. Representative core biopsy samples obtained as above. IMPRESSION: 1. Technically successful ultrasound-guided core biopsy, right inguinal adenopathy. Electronically Signed   By: Lucrezia Europe M.D.   On: 08/06/2018 16:55   Vas [place] Lower Extremity Venous (dvt)  Result Date: 08/14/2018  Lower Venous Study Indications: Swelling.  Limitations: Poor ultrasound/tissue interface. Performing Technologist: Oliver Hum, J  Examination Guidelines: A complete evaluation includes B-mode imaging, spectral Doppler, color Doppler, and power Doppler as needed of all accessible portions of each vessel. Bilateral testing is considered an integral part of a complete examination. Limited examinations for reoccurring indications may be performed as noted.  Right Venous Findings: +---------+---------------+---------+-----------+----------+-------+          CompressibilityPhasicitySpontaneityPropertiesSummary +---------+---------------+---------+-----------+----------+-------+ CFV      Full           Yes      Yes                          +---------+---------------+---------+-----------+----------+-------+ SFJ      Full                                                 +---------+---------------+---------+-----------+----------+-------+ FV Prox  Full                                                 +---------+---------------+---------+-----------+----------+-------+ FV Mid   Full                                                 +---------+---------------+---------+-----------+----------+-------+ FV DistalFull                                                  +---------+---------------+---------+-----------+----------+-------+  PFV      Full                                                 +---------+---------------+---------+-----------+----------+-------+ POP      Full           Yes      Yes                          +---------+---------------+---------+-----------+----------+-------+ PTV      Full                                                 +---------+---------------+---------+-----------+----------+-------+ PERO     Full                                                 +---------+---------------+---------+-----------+----------+-------+  Left Venous Findings: +---+---------------+---------+-----------+----------+-------+    CompressibilityPhasicitySpontaneityPropertiesSummary +---+---------------+---------+-----------+----------+-------+ CFVFull           Yes      Yes                          +---+---------------+---------+-----------+----------+-------+    Summary: Right: There is no evidence of deep vein thrombosis in the lower extremity. No cystic structure found in the popliteal fossa. There is evidence of a large, mix echogenic area in the right groin measuring 4.9 cm high by greater than 4.6 cm wide by greater  than 5.8 cm long.  *See table(s) above for measurements and observations. Electronically signed by Deitra Mayo MD on 08/14/2018 at 5:43:43 AM.    Final     Labs:  CBC: Recent Labs    08/06/18 1105 08/13/18 1222 08/23/18 1007  WBC 4.3 3.4* 5.3  HGB 10.6* 10.5* 11.0*  HCT 34.0* 33.5* 35.4*  PLT 201 198 266    COAGS: Recent Labs    08/06/18 1105 08/23/18 1007  INR 1.05 0.99  APTT  --  22*    BMP: Recent Labs    08/13/18 1222  NA 142  K 3.5  CL 108  CO2 23  GLUCOSE 92  BUN 12  CALCIUM 9.2  CREATININE 0.76  GFRNONAA >60  GFRAA >60    LIVER FUNCTION TESTS: Recent Labs    08/13/18 1222  BILITOT 0.4  AST 14*  ALT 13  ALKPHOS 78  PROT 7.3   ALBUMIN 3.8    TUMOR MARKERS: No results for input(s): AFPTM, CEA, CA199, CHROMGRNA in the last 8760 hours.  Assessment and Plan: B-cell Lymphoma For port placement. Labs ok Risks and benefits of image guided port-a-catheter placement was discussed with the patient including, but not limited to bleeding, infection, pneumothorax, or fibrin sheath development and need for additional procedures.  All of the patient's questions were answered, patient is agreeable to proceed. Consent signed and in chart.    Thank you for this interesting consult.  I greatly enjoyed meeting [place] and look forward to participating in their care.  A copy of this report was sent to the requesting provider on this date.  Electronically Signed: Ascencion Dike, PA-C 08/23/2018, 10:55 AM   I spent a total of 20 minutes in face to face in clinical consultation, greater than 50% of which was counseling/coordinating care for port placement

## 2018-08-23 NOTE — Discharge Instructions (Signed)
Implanted Port Insertion, Care After °This sheet gives you information about how to care for yourself after your procedure. Your health care provider may also give you more specific instructions. If you have problems or questions, contact your health care provider. °What can I expect after the procedure? °After the procedure, it is common to have: °· Discomfort at the port insertion site. °· Bruising on the skin over the port. This should improve over 3-4 days. °Follow these instructions at home: °Port care °· After your port is placed, you will get a manufacturer's information card. The card has information about your port. Keep this card with you at all times. °· Take care of the port as told by your health care provider. Ask your health care provider if you or a family member can get training for taking care of the port at home. A home health care nurse may also take care of the port. °· Make sure to remember what type of port you have. °Incision care ° °  ° °· Follow instructions from your health care provider about how to take care of your port insertion site. Make sure you: °? Wash your hands with soap and water before and after you change your bandage (dressing). If soap and water are not available, use hand sanitizer. °? Change your dressing as told by your health care provider. °? Leave stitches (sutures), skin glue, or adhesive strips in place. These skin closures may need to stay in place for 2 weeks or longer. If adhesive strip edges start to loosen and curl up, you may trim the loose edges. Do not remove adhesive strips completely unless your health care provider tells you to do that. °· Check your port insertion site every day for signs of infection. Check for: °? Redness, swelling, or pain. °? Fluid or blood. °? Warmth. °? Pus or a bad smell. °Activity °· Return to your normal activities as told by your health care provider. Ask your health care provider what activities are safe for you. °· Do not  lift anything that is heavier than 10 lb (4.5 kg), or the limit that you are told, until your health care provider says that it is safe. °General instructions °· Take over-the-counter and prescription medicines only as told by your health care provider. °· Do not take baths, swim, or use a hot tub until your health care provider approves. Ask your health care provider if you may take showers. You may only be allowed to take sponge baths. °· Do not drive for 24 hours if you were given a sedative during your procedure. °· Wear a medical alert bracelet in case of an emergency. This will tell any health care providers that you have a port. °· Keep all follow-up visits as told by your health care provider. This is important. °Contact a health care provider if: °· You cannot flush your port with saline as directed, or you cannot draw blood from the port. °· You have a fever or chills. °· You have redness, swelling, or pain around your port insertion site. °· You have fluid or blood coming from your port insertion site. °· Your port insertion site feels warm to the touch. °· You have pus or a bad smell coming from the port insertion site. °Get help right away if: °· You have chest pain or shortness of breath. °· You have bleeding from your port that you cannot control. °Summary °· Take care of the port as told by your health   care provider. Keep the manufacturer's information card with you at all times. °· Change your dressing as told by your health care provider. °· Contact a health care provider if you have a fever or chills or if you have redness, swelling, or pain around your port insertion site. °· Keep all follow-up visits as told by your health care provider. °This information is not intended to replace advice given to you by your health care provider. Make sure you discuss any questions you have with your health care provider. °Document Released: 04/30/2013 Document Revised: 02/05/2018 Document Reviewed:  02/05/2018 °[employer] Interactive Patient Education © 2019 [employer] °Implanted Port Home Guide °An implanted port is a device that is placed under the skin. It is usually placed in the chest. The device can be used to give IV medicine, to take blood, or for dialysis. You may have an implanted port if: °· You need IV medicine that would be irritating to the small veins in your hands or arms. °· You need IV medicines, such as antibiotics, for a long period of time. °· You need IV nutrition for a long period of time. °· You need dialysis. °Having a port means that your health care provider will not need to use the veins in your arms for these procedures. You may have fewer limitations when using a port than you would if you used other types of long-term IVs, and you will likely be able to return to normal activities after your incision heals. °An implanted port has two main parts: °· Reservoir. The reservoir is the part where a needle is inserted to give medicines or draw blood. The reservoir is round. After it is placed, it appears as a small, raised area under your skin. °· Catheter. The catheter is a thin, flexible tube that connects the reservoir to a vein. Medicine that is inserted into the reservoir goes into the catheter and then into the vein. °How is my port accessed? °To access your port: °· A numbing cream may be placed on the skin over the port site. °· Your health care provider will put on a mask and sterile gloves. °· The skin over your port will be cleaned carefully with a germ-killing soap and allowed to dry. °· Your health care provider will gently pinch the port and insert a needle into it. °· Your health care provider will check for a blood return to make sure the port is in the vein and is not clogged. °· If your port needs to remain accessed to get medicine continuously (constant infusion), your health care provider will place a clear bandage (dressing) over the needle site. The dressing and  needle will need to be changed every week, or as told by your health care provider. °What is flushing? °Flushing helps keep the port from getting clogged. Follow instructions from your health care provider about how and when to flush the port. Ports are usually flushed with saline solution or a medicine called heparin. The need for flushing will depend on how the port is used: °· If the port is only used from time to time to give medicines or draw blood, the port may need to be flushed: °? Before and after medicines have been given. °? Before and after blood has been drawn. °? As part of routine maintenance. Flushing may be recommended every 4-6 weeks. °· If a constant infusion is running, the port may not need to be flushed. °· Throw away any syringes in a disposal   container that is meant for sharp items (sharps container). You can buy a sharps container from a pharmacy, or you can make one by using an empty hard plastic bottle with a cover. °How long will my port stay implanted? °The port can stay in for as long as your health care provider thinks it is needed. When it is time for the port to come out, a surgery will be done to remove it. The surgery will be similar to the procedure that was done to put the port in. °Follow these instructions at home: ° °· Flush your port as told by your health care provider. °· If you need an infusion over several days, follow instructions from your health care provider about how to take care of your port site. Make sure you: °? Wash your hands with soap and water before you change your dressing. If soap and water are not available, use alcohol-based hand sanitizer. °? Change your dressing as told by your health care provider. °? Place any used dressings or infusion bags into a plastic bag. Throw that bag in the trash. °? Keep the dressing that covers the needle clean and dry. Do not get it wet. °? Do not use scissors or sharp objects near the tube. °? Keep the tube clamped,  unless it is being used. °· Check your port site every day for signs of infection. Check for: °? Redness, swelling, or pain. °? Fluid or blood. °? Pus or a bad smell. °· Protect the skin around the port site. °? Avoid wearing bra straps that rub or irritate the site. °? Protect the skin around your port from seat belts. Place a soft pad over your chest if needed. °· Bathe or shower as told by your health care provider. The site may get wet as long as you are not actively receiving an infusion. °· Return to your normal activities as told by your health care provider. Ask your health care provider what activities are safe for you. °· Carry a medical alert card or wear a medical alert bracelet at all times. This will let health care providers know that you have an implanted port in case of an emergency. °Get help right away if: °· You have redness, swelling, or pain at the port site. °· You have fluid or blood coming from your port site. °· You have pus or a bad smell coming from the port site. °· You have a fever. °Summary °· Implanted ports are usually placed in the chest for long-term IV access. °· Follow instructions from your health care provider about flushing the port and changing bandages (dressings). °· Take care of the area around your port by avoiding clothing that puts pressure on the area, and by watching for signs of infection. °· Protect the skin around your port from seat belts. Place a soft pad over your chest if needed. °· Get help right away if you have a fever or you have redness, swelling, pain, drainage, or a bad smell at the port site. °This information is not intended to replace advice given to you by your health care provider. Make sure you discuss any questions you have with your health care provider. °Document Released: 07/10/2005 Document Revised: 08/12/2016 Document Reviewed: 08/12/2016 °[employer] Interactive Patient Education © 2019 [employer] °Moderate Conscious Sedation, Adult, Care  After °These instructions provide you with information about caring for yourself after your procedure. Your health care provider may also give you more specific instructions. Your treatment has   been planned according to current medical practices, but problems sometimes occur. Call your health care provider if you have any problems or questions after your procedure. °What can I expect after the procedure? °After your procedure, it is common: °· To feel sleepy for several hours. °· To feel clumsy and have poor balance for several hours. °· To have poor judgment for several hours. °· To vomit if you eat too soon. °Follow these instructions at home: °For at least 24 hours after the procedure: ° °· Do not: °? Participate in activities where you could fall or become injured. °? Drive. °? Use heavy machinery. °? Drink alcohol. °? Take sleeping pills or medicines that cause drowsiness. °? Make important decisions or sign legal documents. °? Take care of children on your own. °· Rest. °Eating and drinking °· Follow the diet recommended by your health care provider. °· If you vomit: °? Drink water, juice, or soup when you can drink without vomiting. °? Make sure you have little or no nausea before eating solid foods. °General instructions °· Have a responsible adult stay with you until you are awake and alert. °· Take over-the-counter and prescription medicines only as told by your health care provider. °· If you smoke, do not smoke without supervision. °· Keep all follow-up visits as told by your health care provider. This is important. °Contact a health care provider if: °· You keep feeling nauseous or you keep vomiting. °· You feel light-headed. °· You develop a rash. °· You have a fever. °Get help right away if: °· You have trouble breathing. °This information is not intended to replace advice given to you by your health care provider. Make sure you discuss any questions you have with your health care provider. °Document  Released: 04/30/2013 Document Revised: 12/13/2015 Document Reviewed: 10/30/2015 °[employer] Interactive Patient Education © 2019 [employer] ° °

## 2018-08-23 NOTE — Progress Notes (Signed)
HEMATOLOGY/ONCOLOGY CLINIC NOTE  Date of Service: 08/26/2018  Patient Care Team: Taylor Cha, MD as PCP - General (Internal Medicine)  CHIEF COMPLAINTS/PURPOSE OF CONSULTATION:  Large B-Cell Lymphoma  Oncologic History:  Taylor Huynh presented with extranodal involvement in the terminal ileum s/p gross tumor resection in November 2010, and was diagnosed with High Grade B-Cell Non-Hodgkin's Lymphoma. She was treated with 4 cycles of R-CHOP between 07/05/09 and 09/06/09.  HISTORY OF PRESENTING ILLNESS:   [name] is a wonderful 78 y.o. female who has been referred to [place] by Dr. Lawerance Cruel for evaluation and management of Large B-Cell Lymphoma. She is accompanied today by her daugher. The pt reports that she is doing well overall.  The pt reports that she found a lump in her right groin just before 07/17/18. She was evaluated by her PCP on 07/18/18 with a CT A/P, as noted below. She denies this lump changing much since she first noticed it, and endorses some soreness. The pt denies fevers, chills, night sweats or unexpected weight loss. Prior to this, the patient denies any changes in her quality of life or ability to function. She notes that her right leg became swollen yesterday as well. She denies changes in her bowel movements, abdominal pains, and left leg swelling. She denies any problems passing urine nor blood in the urine. She denies heart problems or recent chest pain or SOB. She denies changes in vision or headaches.   The pt has been eating well and endorses good appetite.   The pt takes Amlodipine every other day. She takes 58m Prilosec every day, as well as Vitamin B12 replacement. She denies stroke history, lung problems, nor heart problems. She notes a history of minor seizures, which she hasn't had since she began B12 replacement several years ago. The pt has never smoked cigarettes. She notes that she does not need help with any daily  activities and lives with her husband who also functions independently. She attends a local gym regularly.   Of note prior to the patient's visit today, pt has had a CT A/P completed on 07/18/18 with results revealing Extensive abdominal-pelvic adenopathy, consistent with recurrent lymphoma. 2. Tiny hiatal hernia. 3. Pelvic floor laxity. Aortic Atherosclerosis.  Most recent lab results (08/06/18) of CBC is as follows: all values are WNL except for RBC at 3.75, HGB at 10.6, HCT at 34.0  On review of systems, pt reports sore right inguinal lump, right leg swelling, good energy levels, sore right thigh, and denies changes in vision, headaches, abdominal pains, changes in bowel habits, problems passing urine, blood in the urine, left leg swelling, fevers, chills, night sweats, noticing other lumps or bumps, back pain, pain along the spine, calf pain, and any other symptoms.   On PMHx the pt reports High Grade B-Cell Non-Hodgkin's lymphoma in 2010, HTN, Vitamin B12 deficiency, minor seizures, GERD. On Social Hx the pt denies chemical exposure.  Interval History:   AOMAYA NIELANDreturns today for management and evaluation of her newly diagnosed Recurrent/New High Grade B-Cell Non-Hodgkin's Lymphoma. The patient's last visit with [place] on 08/13/18. She is accompanied today by three members of her family. The pt reports that she is doing well overall.   The pt reports that her right leg swelling has greatly improved in the interim, after beginning Lasix. Her 08/13/18 VAS [place] did rule out a DVT. The pt also received her port placement a few days ago and notes no problems  with her port. The pt denies having any back pains at this time. The pt notes that she has been eating very well and endorses strong appetite while on steroids. The pt denies any abdominal pains.  Of note since the patient's last visit, pt has had a PET/CT completed on 08/22/18 with results revealing Progression, since 07/18/2018, of  hypermetabolic abdominopelvic adenopathy, consistent with active lymphoma. 2. CT occult hypermetabolic osseous foci, also most consistent with active lymphoma. 3. No evidence of soft tissue disease above the diaphragm. 4. Focus of hypermetabolism within the anus is likely physiologic. Consider physical exam correlation. 5.  Aortic Atherosclerosis.  Lab results (08/23/18) of CBC is as follows: all values are WNL except for HGB at 11.0, HCT at 35.4.  On review of systems, pt reports resolved right leg swelling, eating well, good energy levels, strong appetite, and denies back pains, abdominal pains, and any other symptoms.   MEDICAL HISTORY:  Past Medical History:  Diagnosis Date  . GERD (gastroesophageal reflux disease)   . HH (hiatus hernia) 12/12/2011  . Hypertension   . Leukopenia 12/12/2011  . nhl dx'd 2010  . Seizures ([place]) reports "years ago"  . Syncope 12/12/2011   Hx seizures - not documented - age 48; recent syncope 6/12 and x2, 12/12; negative MRI brain 1/13    SURGICAL HISTORY: Past Surgical History:  Procedure Laterality Date  . COLON SURGERY    . IR IMAGING GUIDED PORT INSERTION  08/23/2018    SOCIAL HISTORY: Social History   Socioeconomic History  . Marital status: Married    Spouse name: Not on file  . Number of children: Not on file  . Years of education: Not on file  . Highest education level: Not on file  Occupational History  . Not on file  Social Needs  . Financial resource strain: Not on file  . Food insecurity:    Worry: Not on file    Inability: Not on file  . Transportation needs:    Medical: Not on file    Non-medical: Not on file  Tobacco Use  . Smoking status: Never Smoker  . Smokeless tobacco: Never Used  Substance and Sexual Activity  . Alcohol use: No  . Drug use: No  . Sexual activity: Not on file  Lifestyle  . Physical activity:    Days per week: Not on file    Minutes per session: Not on file  . Stress: Not on file  Relationships  .  Social connections:    Talks on phone: Not on file    Gets together: Not on file    Attends religious service: Not on file    Active member of club or organization: Not on file    Attends meetings of clubs or organizations: Not on file    Relationship status: Not on file  . Intimate partner violence:    Fear of current or ex partner: Not on file    Emotionally abused: Not on file    Physically abused: Not on file    Forced sexual activity: Not on file  Other Topics Concern  . Not on file  Social History Narrative  . Not on file    FAMILY HISTORY: No family history on file.  ALLERGIES:  has No Known Allergies.  MEDICATIONS:  Current Outpatient Medications  Medication Sig Dispense Refill  . amLODipine (NORVASC) 10 MG tablet Take 10 mg by mouth daily.     . cholecalciferol (VITAMIN D) 1000 UNITS tablet Take 1,000  Units by mouth daily.      . furosemide (LASIX) 20 MG tablet Take 1 tablet (20 mg total) by mouth daily. Hold if you are dizzy. 30 tablet 0  . Melatonin 5 MG TABS Take 5 mg by mouth daily.    Marland Kitchen omeprazole (PRILOSEC) 20 MG capsule Take 20 mg by mouth daily.     . potassium chloride SA (K-DUR,KLOR-CON) 20 MEQ tablet Take 1 tablet (20 mEq total) by mouth 2 (two) times daily. 30 tablet 0  . vitamin B-12 (CYANOCOBALAMIN) 1000 MCG tablet Take 1,000 mcg by mouth every other day.     No current facility-administered medications for this visit.     REVIEW OF SYSTEMS:    A 10+ POINT REVIEW OF SYSTEMS WAS OBTAINED including neurology, dermatology, psychiatry, cardiac, respiratory, lymph, extremities, GI, GU, Musculoskeletal, constitutional, breasts, reproductive, HEENT.  All pertinent positives are noted in the HPI.  All others are negative.   PHYSICAL EXAMINATION: ECOG PERFORMANCE STATUS: 1 - Symptomatic but completely ambulatory  . Vitals:   08/26/18 0917  BP: (!) 160/84  Pulse: (!) 102  Resp: 18  Temp: 98.3 F (36.8 C)  SpO2: 98%   Filed Weights   08/26/18 0917    Weight: 182 lb 3.2 oz (82.6 kg)   .Body mass index is 30.79 kg/m.  GENERAL:alert, in no acute distress and comfortable SKIN: no acute rashes, no significant lesions EYES: conjunctiva are pink and non-injected, sclera anicteric OROPHARYNX: MMM, no exudates, no oropharyngeal erythema or ulceration NECK: supple, no JVD LYMPH: Right inguinal 4-6cm LN, no palpable lymphadenopathy in the cervical or axillary regions LUNGS: clear to auscultation b/l with normal respiratory effort HEART: regular rate & rhythm ABDOMEN:  normoactive bowel sounds , non tender, not distended. No palpable hepatosplenomegaly.  Extremity: trace right pedal edema, no left pedal edema PSYCH: alert & oriented x 3 with fluent speech NEURO: no focal motor/sensory deficits   LABORATORY DATA:  I have reviewed the data as listed  . CBC Latest Ref Rng & Units 08/23/2018 08/13/2018 08/06/2018  WBC 4.0 - 10.5 K/uL 5.3 3.4(L) 4.3  Hemoglobin 12.0 - 15.0 g/dL 11.0(L) 10.5(L) 10.6(L)  Hematocrit 36.0 - 46.0 % 35.4(L) 33.5(L) 34.0(L)  Platelets 150 - 400 K/uL 266 198 201    . CMP Latest Ref Rng & Units 08/13/2018 09/13/2016 09/12/2015  Glucose 70 - 99 mg/dL 92 91 92  BUN 8 - 23 mg/dL _0 Creatinine 0.44 - 1.00 mg/dL 0.76 0.90 0.85  Sodium 135 - 145 mmol/L 142 144 137  Potassium 3.5 - 5.1 mmol/L 3.5 3.7 3.4(L)  Chloride 98 - 111 mmol/L 108 106 105  CO2 22 - 32 mmol/L 23 - 21(L)  Calcium 8.9 - 10.3 mg/dL 9.2 - 8.9  Total Protein 6.5 - 8.1 g/dL 7.3 - 6.6  Total Bilirubin 0.3 - 1.2 mg/dL 0.4 - 0.4  Alkaline Phos 38 - 126 U/L 78 - 65  AST 15 - 41 U/L 14(L) - 18  ALT 0 - 44 U/L 13 - 16   08/06/18 Biopsy:    06/01/09 Pathology:     07/18/18 CBC w/diff:    RADIOGRAPHIC STUDIES: I have personally reviewed the radiological images as listed and agreed with the findings in the report.  08/14/18 US Venous RLE:   Nm Pet Image Initial (pi) Skull Base To Thigh  Result Date: 08/22/2018 CLINICAL DATA:  Subsequent  treatment strategy for restaging of B-cell lymphoma. Initially diagnosed with lymphoma in 2011. EXAM: NUCLEAR MEDICINE PET SKULL  BASE TO THIGH TECHNIQUE: 10.0 mCi F-18 FDG was injected intravenously. Full-ring PET imaging was performed from the skull base to thigh after the radiotracer. CT data was obtained and used for attenuation correction and anatomic localization. Fasting blood glucose: 84 mg/dl COMPARISON:  09/22/2009 PET.  07/18/2018 abdominopelvic CT FINDINGS: Mediastinal blood pool activity: SUV max 2.3 NECK: No areas of abnormal hypermetabolism. Incidental CT findings: Bilateral carotid atherosclerosis. No cervical adenopathy. CHEST: No pulmonary parenchymal or thoracic nodal hypermetabolism. Incidental CT findings: Mild cardiomegaly. Aortic atherosclerosis. Tiny hiatal hernia. Small bilateral thyroid nodules are nonspecific. No thoracic adenopathy. Inferior left-sided pleural fat proliferation versus small left-sided Bochdalek's hernia. ABDOMEN/PELVIS: Hypermetabolism corresponding to the previously described abdominopelvic adenopathy. Left periaortic adenopathy at up to 2.2 cm and a S.U.V. max of 35.3 on image 128/4. This node is increased from 1.3 cm on the prior. Right external iliac adenopathy measures 4.4 cm and a S.U.V. max of 39.3 on image 167/4. Compare 3.2 cm on the prior diagnostic CT. Right inguinal node measures 5.4 x 5.6 cm and a S.U.V. max of 43.7 on image 185/4. Enlarged from 4.4 x 4.0 cm on 07/18/2018. Focus of hypermetabolism within the anus is without CT correlate. Incidental CT findings: Abdominal aortic atherosclerosis. Moderate hepatic steatosis. Cholecystectomy. Left renal cyst. Pelvic floor laxity. SKELETON: Focus of eighth right lateral rib hypermetabolism is without CT correlate, including at a S.U.V. max of 8.8 on image 91/4. A left transverse process focus of hypermetabolism measures a S.U.V. max of 6.1 at approximately the T6 level. Incidental CT findings: none IMPRESSION: 1.  Progression, since 16/07/930, of hypermetabolic abdominopelvic adenopathy, consistent with active lymphoma. 2. CT occult hypermetabolic osseous foci, also most consistent with active lymphoma. 3. No evidence of soft tissue disease above the diaphragm. 4. Focus of hypermetabolism within the anus is likely physiologic. Consider physical exam correlation. 5.  Aortic Atherosclerosis (ICD10-I70.0). Electronically Signed   By: Abigail Miyamoto M.D.   On: 08/22/2018 11:56   [place] Core Biopsy (lymph Nodes)  Result Date: 08/06/2018 INDICATION: History of lymphoma. Adenopathy including right inguinal adenopathy. EXAM: ULTRASOUND GUIDED CORE BIOPSY OF RIGHT INGUINAL ADENOPATHY MEDICATIONS: Lidocaine 1% subcutaneous ANESTHESIA/SEDATION: Patient declined sedation PROCEDURE: The procedure, risks, benefits, and alternatives were explained to the patient. Questions regarding the procedure were encouraged and answered. The patient understands and consents to the procedure. Survey ultrasound of the right inguinal region performed. The adenopathy was localized and an appropriate skin entry site determined and marked. The operative field was prepped with chlorhexidine in a sterile fashion, and a sterile drape was applied covering the operative field. A sterile gown and sterile gloves were used for the procedure. Local anesthesia was provided with 1% Lidocaine. Under real-time ultrasound guidance, a 17 gauge trocar needle was advanced to the margin of the lesion. Once needle tip position was confirmed, coaxial 18-gauge core biopsy samples were obtained, submitted in saline to surgical pathology. The guide needle was removed. Follow-up scan shows no hemorrhage or other apparent complication. The patient tolerated the procedure well. COMPLICATIONS: None immediate. FINDINGS: Right inguinal adenopathy was localized. Representative core biopsy samples obtained as above. IMPRESSION: 1. Technically successful ultrasound-guided core biopsy, right  inguinal adenopathy. Electronically Signed   By: Lucrezia Europe M.D.   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 Port-A-Cath needed for treatment. EXAM: FLUOROSCOPIC AND ULTRASOUND GUIDED PLACEMENT OF A SUBCUTANEOUS PORT COMPARISON:  None. MEDICATIONS: Ancef 2 g; The antibiotic was administered within  an appropriate time interval prior to skin puncture. ANESTHESIA/SEDATION: Versed 2.0 mg IV; Fentanyl 100 mcg IV; Moderate Sedation Time:  30 minutes The patient was continuously monitored during the procedure by the interventional radiology nurse under my direct supervision. FLUOROSCOPY TIME:  12 seconds, 3 mGy COMPLICATIONS: None immediate. PROCEDURE: The procedure, risks, benefits, and alternatives were explained to the patient. Questions regarding the procedure were encouraged and answered. The patient understands and consents to the procedure. Patient was placed supine on the interventional table. Ultrasound confirmed a patent right internal jugular vein. Ultrasound image was saved for documentation. The right chest and neck were cleaned with a skin antiseptic and a sterile drape was placed. Maximal barrier sterile technique was utilized including caps, mask, sterile gowns, sterile gloves, sterile drape, hand hygiene and skin antiseptic. The right neck was anesthetized with 1% lidocaine. Small incision was made in the right neck with a blade. Micropuncture set was placed in the right internal jugular vein with ultrasound guidance. The micropuncture wire was used for measurement purposes. The right chest was anesthetized with 1% lidocaine with epinephrine. #15 blade was used to make an incision and a subcutaneous port pocket was formed. [place] was assembled. Subcutaneous tunnel was formed with a stiff tunneling device. The port catheter was brought through the subcutaneous tunnel. The port was placed in  the subcutaneous pocket and sutured in place. The micropuncture set was exchanged for a peel-away sheath. The catheter was placed through the peel-away sheath and the tip was positioned at the superior cavoatrial junction. Catheter placement was confirmed with fluoroscopy. The port was accessed and flushed with heparinized saline. The port pocket was closed using two layers of absorbable sutures and Dermabond. The vein skin site was closed using a single layer of absorbable suture and Dermabond. Sterile dressings were applied. Patient tolerated the procedure well without an immediate complication. Ultrasound and fluoroscopic images were taken and saved for this procedure. IMPRESSION: Placement of a subcutaneous port device. Catheter tip at the superior cavoatrial junction. Electronically Signed   By: Markus Daft M.D.   On: 08/23/2018 16:37   Vas [place] Lower Extremity Venous (dvt)  Result Date: 08/14/2018  Lower Venous Study Indications: Swelling.  Limitations: Poor ultrasound/tissue interface. Performing Technologist: Oliver Hum, J  Examination Guidelines: A complete evaluation includes B-mode imaging, spectral Doppler, color Doppler, and power Doppler as needed of all accessible portions of each vessel. Bilateral testing is considered an integral part of a complete examination. Limited examinations for reoccurring indications may be performed as noted.  Right Venous Findings: +---------+---------------+---------+-----------+----------+-------+          CompressibilityPhasicitySpontaneityPropertiesSummary +---------+---------------+---------+-----------+----------+-------+ CFV      Full           Yes      Yes                          +---------+---------------+---------+-----------+----------+-------+ SFJ      Full                                                 +---------+---------------+---------+-----------+----------+-------+ FV Prox  Full                                                  +---------+---------------+---------+-----------+----------+-------+  FV Mid   Full                                                 +---------+---------------+---------+-----------+----------+-------+ FV DistalFull                                                 +---------+---------------+---------+-----------+----------+-------+ PFV      Full                                                 +---------+---------------+---------+-----------+----------+-------+ POP      Full           Yes      Yes                          +---------+---------------+---------+-----------+----------+-------+ PTV      Full                                                 +---------+---------------+---------+-----------+----------+-------+ PERO     Full                                                 +---------+---------------+---------+-----------+----------+-------+  Left Venous Findings: +---+---------------+---------+-----------+----------+-------+    CompressibilityPhasicitySpontaneityPropertiesSummary +---+---------------+---------+-----------+----------+-------+ CFVFull           Yes      Yes                          +---+---------------+---------+-----------+----------+-------+    Summary: Right: There is no evidence of deep vein thrombosis in the lower extremity. No cystic structure found in the popliteal fossa. There is evidence of a large, mix echogenic area in the right groin measuring 4.9 cm high by greater than 4.6 cm wide by greater  than 5.8 cm long.  *See table(s) above for measurements and observations. Electronically signed by Deitra Mayo MD on 08/14/2018 at 5:43:43 AM.    Final     ASSESSMENT & PLAN:   78 y.o. female with  1. 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 Newly Diagnosed Recurrent/New High Grade B-Cell Non-Hodgkin's Lymphoma, Stage IV   07/18/18 CT A/P revealed Extensive abdominal-pelvic adenopathy, consistent with recurrent lymphoma. 2. Tiny hiatal hernia. 3. Pelvic floor laxity. Aortic Atherosclerosis.  Labs upon initial presentation from 08/06/18, WBC normal at 4.3k, HGB at 10.6, PLT normal at 201k  08/06/18 Right Inguinal LN Biopsy which revealed Large B-Cell Lymphoma with a Ki67  of 90%. Further genetic characterization is pending. ?double hit lymphoma  3. RLE swelling r/o DVT. Could be from lymphedema related to her new/recurrent NHL  08/13/08 US Venous RLE to rule out DVT - reviewed - no VTE  PLAN: -Discussed pt labwork from 08/23/18; HGB improved to 11.0, WBC normal at 5.3k, PLT normal at 266k.  -Genetic testing is pending for further characterization, double hit lymphoma? -Discussed the 08/22/18 PET/CT which revealed Progression, since 30/03/7948, of hypermetabolic abdominopelvic adenopathy, consistent with active lymphoma. 2. CT occult hypermetabolic osseous foci, also most consistent with active lymphoma. 3. No evidence of soft tissue disease above the diaphragm. 4. Focus of hypermetabolism within the anus is likely physiologic. Consider physical exam correlation. 5.  Aortic Atherosclerosis. -Discussed the 08/21/18 ECHO which revealed LV EF of 50-55% -08/13/18 Hep B and Hep C negative -Discussed that the staging indicated by the PET/CT is Stage IV -Discussed that it is difficult to know for certain whether the patient's lymphoma is a new and independent process, or related to her previous lymphoma 9 years ago. Though, this type of lymphoma usually reoccurs within 2 years of previous treatment. -Discussed my  recommendation for treatment with EPOCH-R with dose reduced and with G-CSF support, up to 6 cycles -Will begin C1 with 1-2 level dose reduced EPOCH-R, and will gauge indications for larger doses pending her display of tolerance  -Discussed the possibility after displayed tolerance, to consider preventative IT MTX given the possibility of brain recurrence in the future -Will evaluate pt with PET/CT after C2 or C3 for evaluation of treatment efficacy -Will tentatively begin C1 EPOCH-R on Monday, 09/02/18 -Will set pt up for chemotherapy counseling  -Fine to hold Lasix now with resolved leg swelling   Chemo-counseling appointment for daEPOCH-R in 2-3 days Inpatient admission for [place] for 5 days from 09/02/2018 Outpatient Neulasta and Rituxan (1st dose) on 09/09/2018 RTC with Dr Irene Limbo in 09/16/2018 with labs   All of the patients questions were answered with apparent satisfaction. The patient knows to call the clinic with any problems, questions or concerns.  The total time spent in the appt was 30 minutes and more than 50% was on counseling and direct patient cares.    Sullivan Lone MD MS AAHIVMS [facility] Hematology/Oncology Physician [facility]  (Office):       [phone] (Work cell):  [phone] (Fax):           [phone]  08/26/2018 10:05 AM  I, Baldwin [place], am acting as a scribe for Dr. Sullivan Lone.   .I have reviewed the above documentation for accuracy and completeness, and I agree with the above.  Brunetta Genera MD

## 2018-08-26 ENCOUNTER — Inpatient Hospital Stay: Payer: Medicare Other | Attending: Hematology | Admitting: Hematology

## 2018-08-26 ENCOUNTER — Telehealth: Payer: Self-pay

## 2018-08-26 ENCOUNTER — Encounter (HOSPITAL_COMMUNITY): Payer: Self-pay | Admitting: Hematology

## 2018-08-26 VITALS — BP 160/84 | HR 102 | Temp 98.3°F | Resp 18 | Ht 64.5 in | Wt 182.2 lb

## 2018-08-26 DIAGNOSIS — R599 Enlarged lymph nodes, unspecified: Secondary | ICD-10-CM | POA: Diagnosis not present

## 2018-08-26 DIAGNOSIS — K760 Fatty (change of) liver, not elsewhere classified: Secondary | ICD-10-CM | POA: Insufficient documentation

## 2018-08-26 DIAGNOSIS — K449 Diaphragmatic hernia without obstruction or gangrene: Secondary | ICD-10-CM

## 2018-08-26 DIAGNOSIS — I6523 Occlusion and stenosis of bilateral carotid arteries: Secondary | ICD-10-CM

## 2018-08-26 DIAGNOSIS — E042 Nontoxic multinodular goiter: Secondary | ICD-10-CM | POA: Diagnosis not present

## 2018-08-26 DIAGNOSIS — C8338 Diffuse large B-cell lymphoma, lymph nodes of multiple sites: Secondary | ICD-10-CM | POA: Diagnosis not present

## 2018-08-26 DIAGNOSIS — R590 Localized enlarged lymph nodes: Secondary | ICD-10-CM | POA: Diagnosis not present

## 2018-08-26 DIAGNOSIS — I10 Essential (primary) hypertension: Secondary | ICD-10-CM | POA: Diagnosis not present

## 2018-08-26 DIAGNOSIS — I700 Atherosclerosis of aorta: Secondary | ICD-10-CM | POA: Diagnosis not present

## 2018-08-26 DIAGNOSIS — Z7689 Persons encountering health services in other specified circumstances: Secondary | ICD-10-CM | POA: Diagnosis not present

## 2018-08-26 DIAGNOSIS — Z8669 Personal history of other diseases of the nervous system and sense organs: Secondary | ICD-10-CM | POA: Diagnosis not present

## 2018-08-26 DIAGNOSIS — N281 Cyst of kidney, acquired: Secondary | ICD-10-CM

## 2018-08-26 DIAGNOSIS — K219 Gastro-esophageal reflux disease without esophagitis: Secondary | ICD-10-CM

## 2018-08-26 DIAGNOSIS — R569 Unspecified convulsions: Secondary | ICD-10-CM | POA: Diagnosis not present

## 2018-08-26 DIAGNOSIS — Z5112 Encounter for antineoplastic immunotherapy: Secondary | ICD-10-CM | POA: Insufficient documentation

## 2018-08-26 DIAGNOSIS — E538 Deficiency of other specified B group vitamins: Secondary | ICD-10-CM | POA: Insufficient documentation

## 2018-08-26 DIAGNOSIS — Z79899 Other long term (current) drug therapy: Secondary | ICD-10-CM | POA: Insufficient documentation

## 2018-08-26 NOTE — Patient Instructions (Signed)
Thank you for choosing [facility] to provide your oncology and hematology care.  To afford each patient quality time with our providers, please arrive 30 minutes before your scheduled appointment time.  If you arrive late for your appointment, you may be asked to reschedule.  We strive to give you quality time with our providers, and arriving late affects you and other patients whose appointments are after yours.    If you are a no show for multiple scheduled visits, you may be dismissed from the clinic at the providers discretion.     Again, thank you for choosing [facility], our hope is that these requests will decrease the amount of time that you wait before being seen by our physicians.  ______________________________________________________________________   Should you have questions after your visit to the [facility], please contact our office at [phone] between the hours of 8:30 and 4:30 p.m.    Voicemails left after 4:30p.m will not be returned until the following business day.     For prescription refill requests, please have your pharmacy contact us directly.  Please also try to allow 48 hours for prescription requests.     Please contact the scheduling department for questions regarding scheduling.  For scheduling of procedures such as PET scans, CT scans, MRI, Ultrasound, etc please contact central scheduling at [phone].     Resources For Cancer Patients and Caregivers:    Oncolink.org:  A wonderful resource for patients and healthcare providers for information regarding your disease, ways to tract your treatment, what to expect, etc.      American Cancer Society:  800-227-2345  Can help patients locate various types of support and financial assistance   Cancer Care: 1-800-813-HOPE (4673) Provides financial assistance, online support groups, medication/co-pay assistance.     [place] DSS:  336-641-3447 Where to apply  for food stamps, Medicaid, and utility assistance   Medicare Rights Center: 800-333-4114 Helps people with Medicare understand their rights and benefits, navigate the Medicare system, and secure the quality healthcare they deserve   SCAT: 336-333-6589 [place] [employer]'s shared-ride transportation service for eligible riders who have a disability that prevents them from riding the fixed route bus.     For additional information on assistance programs please contact our social worker:   Abigail Elmore:  [phone]  

## 2018-08-26 NOTE — Telephone Encounter (Signed)
Printed avs and calender of upcoming appointments.per 2/3 los. Chemo added to the book for override.

## 2018-08-27 ENCOUNTER — Inpatient Hospital Stay: Payer: Medicare Other

## 2018-08-28 ENCOUNTER — Telehealth: Payer: Self-pay | Admitting: *Deleted

## 2018-08-28 MED ORDER — LIDOCAINE-PRILOCAINE 2.5-2.5 % EX CREA: 1.0000 "application " | TOPICAL_CREAM | CUTANEOUS | 1 refills | Status: AC | PRN

## 2018-08-28 NOTE — Telephone Encounter (Signed)
Contacted Dawn in Patient Placement for admission to [facility] for 5 day [place] on 09/02/2018.  Sent email to [email or web address] with patient information.  Contacted patient to let her know the process and that [facility] will call her Monday 09/02/2018 AM when room is ready. Patient verbalized understanding.

## 2018-08-29 ENCOUNTER — Other Ambulatory Visit: Payer: Self-pay | Admitting: Hematology

## 2018-08-29 DIAGNOSIS — Z7189 Other specified counseling: Secondary | ICD-10-CM | POA: Insufficient documentation

## 2018-08-29 DIAGNOSIS — C8330 Diffuse large B-cell lymphoma, unspecified site: Secondary | ICD-10-CM | POA: Insufficient documentation

## 2018-08-29 DIAGNOSIS — C8338 Diffuse large B-cell lymphoma, lymph nodes of multiple sites: Secondary | ICD-10-CM | POA: Insufficient documentation

## 2018-08-29 NOTE — Progress Notes (Signed)
START OFF PATHWAY REGIMEN - Lymphoma and CLL   [id number]:DA-EPOCH-R q21 Days:   A cycle is every 21 days:     Prednisone      Rituximab      Etoposide      Doxorubicin      Vincristine      Cyclophosphamide      Filgrastim-xxxx   **Always confirm dose/schedule in your pharmacy ordering system**  Patient Characteristics: High Grade (such as Burkitt's) Disease Type: Not Applicable Disease Type: High Grade Lymphoma (such as Burkitt) Disease Type: Not Applicable [place] Stage: III Intent of Therapy: Curative Intent, Discussed with Patient

## 2018-08-30 ENCOUNTER — Other Ambulatory Visit: Payer: Self-pay | Admitting: Hematology

## 2018-09-02 ENCOUNTER — Inpatient Hospital Stay (HOSPITAL_COMMUNITY)
Admission: RE | Admit: 2018-09-02 | Discharge: 2018-09-06 | DRG: 846 | Disposition: A | Discharge status: Home / Self Care | Payer: Medicare Other | Source: Ambulatory Visit | Attending: Hematology | Admitting: Hematology

## 2018-09-02 ENCOUNTER — Other Ambulatory Visit: Payer: Self-pay

## 2018-09-02 ENCOUNTER — Encounter (HOSPITAL_COMMUNITY): Payer: Self-pay

## 2018-09-02 DIAGNOSIS — R569 Unspecified convulsions: Secondary | ICD-10-CM | POA: Diagnosis present

## 2018-09-02 DIAGNOSIS — T451X5A Adverse effect of antineoplastic and immunosuppressive drugs, initial encounter: Secondary | ICD-10-CM | POA: Diagnosis present

## 2018-09-02 DIAGNOSIS — D630 Anemia in neoplastic disease: Secondary | ICD-10-CM | POA: Diagnosis present

## 2018-09-02 DIAGNOSIS — I10 Essential (primary) hypertension: Secondary | ICD-10-CM | POA: Diagnosis present

## 2018-09-02 DIAGNOSIS — E876 Hypokalemia: Secondary | ICD-10-CM | POA: Diagnosis present

## 2018-09-02 DIAGNOSIS — Z5111 Encounter for antineoplastic chemotherapy: Secondary | ICD-10-CM | POA: Diagnosis present

## 2018-09-02 DIAGNOSIS — K219 Gastro-esophageal reflux disease without esophagitis: Secondary | ICD-10-CM | POA: Diagnosis present

## 2018-09-02 DIAGNOSIS — D61810 Antineoplastic chemotherapy induced pancytopenia: Secondary | ICD-10-CM | POA: Diagnosis present

## 2018-09-02 DIAGNOSIS — Z7189 Other specified counseling: Secondary | ICD-10-CM

## 2018-09-02 DIAGNOSIS — C8330 Diffuse large B-cell lymphoma, unspecified site: Secondary | ICD-10-CM | POA: Diagnosis present

## 2018-09-02 DIAGNOSIS — I700 Atherosclerosis of aorta: Secondary | ICD-10-CM | POA: Diagnosis present

## 2018-09-02 DIAGNOSIS — K5903 Drug induced constipation: Secondary | ICD-10-CM

## 2018-09-02 DIAGNOSIS — K449 Diaphragmatic hernia without obstruction or gangrene: Secondary | ICD-10-CM | POA: Diagnosis present

## 2018-09-02 DIAGNOSIS — C8338 Diffuse large B-cell lymphoma, lymph nodes of multiple sites: Secondary | ICD-10-CM

## 2018-09-02 LAB — COMPREHENSIVE METABOLIC PANEL
ALT: 13 U/L (ref 0–44)
AST: 14 U/L — ABNORMAL LOW (ref 15–41)
Albumin: 3.5 g/dL (ref 3.5–5.0)
Alkaline Phosphatase: 49 U/L (ref 38–126)
Anion gap: 7 (ref 5–15)
BILIRUBIN TOTAL: 0.8 mg/dL (ref 0.3–1.2)
BUN: 23 mg/dL (ref 8–23)
CO2: 24 mmol/L (ref 22–32)
CREATININE: 1.04 mg/dL — AB (ref 0.44–1.00)
Calcium: 9 mg/dL (ref 8.9–10.3)
Chloride: 107 mmol/L (ref 98–111)
GFR calc Af Amer: 59 mL/min — ABNORMAL LOW (ref >60)
GFR calc non Af Amer: 51 mL/min — ABNORMAL LOW (ref >60)
Glucose, Bld: 106 mg/dL — ABNORMAL HIGH (ref 70–99)
Potassium: 3.1 mmol/L — ABNORMAL LOW (ref 3.5–5.1)
Sodium: 138 mmol/L (ref 135–145)
TOTAL PROTEIN: 6 g/dL — AB (ref 6.5–8.1)

## 2018-09-02 LAB — CBC WITH DIFFERENTIAL/PLATELET
Abs Immature Granulocytes: 0.01 10*3/uL (ref 0.00–0.07)
Basophils Absolute: 0 10*3/uL (ref 0.0–0.1)
Basophils Relative: 1 %
EOS PCT: 2 %
Eosinophils Absolute: 0.1 10*3/uL (ref 0.0–0.5)
HEMATOCRIT: 30.5 % — AB (ref 36.0–46.0)
Hemoglobin: 9.3 g/dL — ABNORMAL LOW (ref 12.0–15.0)
Immature Granulocytes: 0 %
Lymphocytes Relative: 17 %
Lymphs Abs: 0.6 10*3/uL — ABNORMAL LOW (ref 0.7–4.0)
MCH: 27.8 pg (ref 26.0–34.0)
MCHC: 30.5 g/dL (ref 30.0–36.0)
MCV: 91.3 fL (ref 80.0–100.0)
Monocytes Absolute: 0.6 10*3/uL (ref 0.1–1.0)
Monocytes Relative: 15 %
Neutro Abs: 2.6 10*3/uL (ref 1.7–7.7)
Neutrophils Relative %: 65 %
Platelets: 130 10*3/uL — ABNORMAL LOW (ref 150–400)
RBC: 3.34 MIL/uL — ABNORMAL LOW (ref 3.87–5.11)
RDW: 15.1 % (ref 11.5–15.5)
WBC: 3.9 10*3/uL — ABNORMAL LOW (ref 4.0–10.5)
nRBC: 0 % (ref 0.0–0.2)

## 2018-09-02 LAB — URIC ACID: Uric Acid, Serum: 7 mg/dL (ref 2.5–7.1)

## 2018-09-02 LAB — MAGNESIUM: Magnesium: 1.7 mg/dL (ref 1.7–2.4)

## 2018-09-02 LAB — PHOSPHORUS: Phosphorus: 2.7 mg/dL (ref 2.5–4.6)

## 2018-09-02 MED ORDER — PREDNISONE 20 MG PO TABS
60.0000 mg | ORAL_TABLET | Freq: Every day | ORAL | Status: AC
  Administered 2018-09-02 – 2018-09-06 (×5): 60 mg via ORAL
  Filled 2018-09-02 (×5): qty 3

## 2018-09-02 MED ORDER — ACETAMINOPHEN 325 MG PO TABS: 650.0000 mg | ORAL_TABLET | ORAL | Status: DC | PRN

## 2018-09-02 MED ORDER — ALLOPURINOL 300 MG PO TABS
150.0000 mg | ORAL_TABLET | Freq: Every day | ORAL | Status: DC
  Administered 2018-09-02 – 2018-09-06 (×5): 150 mg via ORAL
  Filled 2018-09-02 (×5): qty 1

## 2018-09-02 MED ORDER — LIDOCAINE-PRILOCAINE 2.5-2.5 % EX CREA: 1.0000 "application " | TOPICAL_CREAM | CUTANEOUS | Status: DC | PRN

## 2018-09-02 MED ORDER — SODIUM CHLORIDE 0.9 % IV SOLN
Freq: Once | INTRAVENOUS | Status: AC
  Administered 2018-09-02: 18 mg via INTRAVENOUS
  Filled 2018-09-02: qty 4

## 2018-09-02 MED ORDER — ALUM & MAG HYDROXIDE-SIMETH 200-200-20 MG/5ML PO SUSP: 60.0000 mL | ORAL | Status: DC | PRN

## 2018-09-02 MED ORDER — PANTOPRAZOLE SODIUM 40 MG PO TBEC
40.0000 mg | DELAYED_RELEASE_TABLET | Freq: Every day | ORAL | Status: DC
  Administered 2018-09-03 – 2018-09-06 (×4): 40 mg via ORAL
  Filled 2018-09-02 (×4): qty 1

## 2018-09-02 MED ORDER — VINCRISTINE SULFATE CHEMO INJECTION 1 MG/ML
Freq: Once | INTRAVENOUS | Status: AC
  Administered 2018-09-02: 16:00:00 via INTRAVENOUS
  Filled 2018-09-02: qty 6

## 2018-09-02 MED ORDER — ENOXAPARIN SODIUM 40 MG/0.4ML ~~LOC~~ SOLN
40.0000 mg | SUBCUTANEOUS | Status: DC
  Administered 2018-09-02 – 2018-09-06 (×5): 40 mg via SUBCUTANEOUS
  Filled 2018-09-02 (×5): qty 0.4

## 2018-09-02 MED ORDER — SODIUM CHLORIDE 0.9 % IV SOLN
INTRAVENOUS | Status: DC
  Administered 2018-09-02: 13:00:00 via INTRAVENOUS

## 2018-09-02 MED ORDER — COLD PACK MISC ONCOLOGY
1.0000 | Freq: Once | Status: DC | PRN
  Filled 2018-09-02: qty 1

## 2018-09-02 MED ORDER — VITAMIN B-12 1000 MCG PO TABS
1000.0000 ug | ORAL_TABLET | ORAL | Status: DC
  Administered 2018-09-04 – 2018-09-06 (×2): 1000 ug via ORAL
  Filled 2018-09-02 (×2): qty 1

## 2018-09-02 MED ORDER — HOT PACK MISC ONCOLOGY
1.0000 | Freq: Once | Status: DC | PRN
  Filled 2018-09-02: qty 1

## 2018-09-02 MED ORDER — AMLODIPINE BESYLATE 10 MG PO TABS
10.0000 mg | ORAL_TABLET | Freq: Every day | ORAL | Status: DC
  Administered 2018-09-03 – 2018-09-06 (×4): 10 mg via ORAL
  Filled 2018-09-02 (×4): qty 1

## 2018-09-02 NOTE — Progress Notes (Signed)
HEMATOLOGY/ONCOLOGY CONSULTATION NOTE  Date of Service: 09/02/2018  Patient Care Team: [name], MD as PCP - General (Internal Medicine)  CHIEF COMPLAINTS/PURPOSE OF CONSULTATION:  C1 dose reduced EPOCH-R Treatment of High Grade B-Cell Non-Hodgkin's Lymphoma  HISTORY OF PRESENTING ILLNESS:   Taylor Huynh is a wonderful 78 y.o. female who has been admitted today for C1 dose reduced EPOCH-R treatment for her High Grade B-Cell Non-Hodgkin's Lymphoma. She is accompanied today by her daughter and granddaughter at bedside. The pt reports that she is doing well overall.   The pt reports that she has had a weaker appetite in the interim. She notes that her right leg continues to be swollen, but has improved slightly. She denies concerns for infections and denies abdominal pains. The pt notes that she is eating about 80% as much as she had been.  Lab results today (09/02/18) of CBC w/diff and CMP is as follows: all values are WNL except for WBC at 3.9k, RBC at 3.34, HGB at 9.3, HCT at 30.5, PLT at 130k, Lymphs abs at 600, Potassium at 3.1, Glucose at 106, Creatinine at 1.04, Total Protein at 6.0, AST at 14, GFR at 59.  On review of systems, pt reports weaker appetite, improving right leg swelling, and denies concerns for infections, and any other symptoms.    MEDICAL HISTORY:  Past Medical History:  Diagnosis Date  . GERD (gastroesophageal reflux disease)   . HH (hiatus hernia) 12/12/2011  . Hypertension   . Leukopenia 12/12/2011  . nhl dx'd 2010  . Seizures ([place]) reports "years ago"  . Syncope 12/12/2011   Hx seizures - not documented - age 89; recent syncope 6/12 and x2, 12/12; negative MRI brain 1/13    SURGICAL HISTORY: Past Surgical History:  Procedure Laterality Date  . COLON SURGERY    . IR IMAGING GUIDED PORT INSERTION  08/23/2018    SOCIAL HISTORY: Social History   Socioeconomic History  . Marital status: Married    Spouse name: Not on file  .  Number of children: Not on file  . Years of education: Not on file  . Highest education level: Not on file  Occupational History  . Not on file  Social Needs  . Financial resource strain: Not on file  . Food insecurity:    Worry: Not on file    Inability: Not on file  . Transportation needs:    Medical: Not on file    Non-medical: Not on file  Tobacco Use  . Smoking status: Never Smoker  . Smokeless tobacco: Never Used  Substance and Sexual Activity  . Alcohol use: No  . Drug use: No  . Sexual activity: Not on file  Lifestyle  . Physical activity:    Days per week: Not on file    Minutes per session: Not on file  . Stress: Not on file  Relationships  . Social connections:    Talks on phone: Not on file    Gets together: Not on file    Attends religious service: Not on file    Active member of club or organization: Not on file    Attends meetings of clubs or organizations: Not on file    Relationship status: Not on file  . Intimate partner violence:    Fear of current or ex partner: Not on file    Emotionally abused: Not on file    Physically abused: Not on file    Forced sexual activity: Not on file  Other Topics Concern  . Not on file  Social History Narrative  . Not on file    FAMILY HISTORY: History reviewed. No pertinent family history.  ALLERGIES:  has No Known Allergies.  MEDICATIONS:  Current Facility-Administered Medications  Medication Dose Route Frequency Provider Last Rate Last Dose  . 0.9 %  sodium chloride infusion   Intravenous Continuous Brunetta Genera, MD 20 mL/hr at 09/02/18 1230    . acetaminophen (TYLENOL) tablet 650 mg  650 mg Oral Q4H PRN Brunetta Genera, MD      . allopurinol (ZYLOPRIM) tablet 150 mg  150 mg Oral Daily Brunetta Genera, MD      . alum & mag hydroxide-simeth (MAALOX/MYLANTA) 200-200-20 MG/5ML suspension 60 mL  60 mL Oral Q4H PRN Brunetta Genera, MD      . Derrill Memo ON 09/03/2018] amLODipine (NORVASC) tablet  10 mg  10 mg Oral Daily Brunetta Genera, MD      . Cold Pack 1 packet  1 packet Topical Once PRN Brunetta Genera, MD      . DOXOrubicin (ADRIAMYCIN) 12 mg, etoposide (VEPESID) 62 mg, vinCRIStine (ONCOVIN) 0.8 mg in sodium chloride 0.9 % 500 mL chemo infusion   Intravenous Once Brunetta Genera, MD      . enoxaparin (LOVENOX) injection 40 mg  40 mg Subcutaneous Q24H Brunetta Genera, MD   40 mg at 09/02/18 1224  . Hot Pack 1 packet  1 packet Topical Once PRN Brunetta Genera, MD      . lidocaine-prilocaine (EMLA) cream 1 application  1 application Topical PRN Brunetta Genera, MD      . ondansetron (ZOFRAN) 8 mg, dexamethasone (DECADRON) 10 mg in sodium chloride 0.9 % 50 mL IVPB   Intravenous Once Brunetta Genera, MD      . Derrill Memo ON 09/03/2018] pantoprazole (PROTONIX) EC tablet 40 mg  40 mg Oral Daily Brunetta Genera, MD      . predniSONE (DELTASONE) tablet 60 mg  60 mg Oral QAC breakfast Brunetta Genera, MD   60 mg at 09/02/18 1225  . [START ON 09/04/2018] vitamin B-12 (CYANOCOBALAMIN) tablet 1,000 mcg  1,000 mcg Oral QODAY Brunetta Genera, MD        REVIEW OF SYSTEMS:    A 10+ POINT REVIEW OF SYSTEMS WAS OBTAINED including neurology, dermatology, psychiatry, cardiac, respiratory, lymph, extremities, GI, GU, Musculoskeletal, constitutional, breasts, reproductive, HEENT.  All pertinent positives are noted in the HPI.  All others are negative.   PHYSICAL EXAMINATION: ECOG PERFORMANCE STATUS: 2 - Symptomatic, <50% confined to bed  . Vitals:   09/02/18 0910 09/02/18 1411  BP: 134/79 (!) 146/68  Pulse: 98 77  Resp: 18 20  Temp: 98.3 F (36.8 C) 98.7 F (37.1 C)  SpO2: 97% 97%   Filed Weights   09/02/18 0910  Weight: 181 lb 1.8 oz (82.2 kg)   .Body mass index is 31.09 kg/m.  GENERAL:alert, in no acute distress and comfortable SKIN: no acute rashes, no significant lesions EYES: conjunctiva are pink and non-injected, sclera  anicteric OROPHARYNX: MMM, no exudates, no oropharyngeal erythema or ulceration NECK: supple, no JVD LYMPH: Right inguinal 4-6cm LN, no palpable lymphadenopathy in the cervical or axillary regions LUNGS: clear to auscultation b/l with normal respiratory effort HEART: regular rate & rhythm ABDOMEN:  normoactive bowel sounds , non tender, not distended. No palpable hepatosplenomegaly.  Extremity: no pedal edema PSYCH: alert & oriented x 3 with fluent speech NEURO: no focal  motor/sensory deficits   LABORATORY DATA:  I have reviewed the data as listed  . CBC Latest Ref Rng & Units 09/02/2018 08/23/2018 08/13/2018  WBC 4.0 - 10.5 K/uL 3.9(L) 5.3 3.4(L)  Hemoglobin 12.0 - 15.0 g/dL 9.3(L) 11.0(L) 10.5(L)  Hematocrit 36.0 - 46.0 % 30.5(L) 35.4(L) 33.5(L)  Platelets 150 - 400 K/uL 130(L) 266 198    . CMP Latest Ref Rng & Units 09/02/2018 08/13/2018 09/13/2016  Glucose 70 - 99 mg/dL 106(H) 92 91  BUN 8 - 23 mg/dL '23 12 15  ' Creatinine 0.44 - 1.00 mg/dL 1.04(H) 0.76 0.90  Sodium 135 - 145 mmol/L 138 142 144  Potassium 3.5 - 5.1 mmol/L 3.1(L) 3.5 3.7  Chloride 98 - 111 mmol/L 107 108 106  CO2 22 - 32 mmol/L 24 23 -  Calcium 8.9 - 10.3 mg/dL 9.0 9.2 -  Total Protein 6.5 - 8.1 g/dL 6.0(L) 7.3 -  Total Bilirubin 0.3 - 1.2 mg/dL 0.8 0.4 -  Alkaline Phos 38 - 126 U/L 49 78 -  AST 15 - 41 U/L 14(L) 14(L) -  ALT 0 - 44 U/L 13 13 -     RADIOGRAPHIC STUDIES: I have personally reviewed the radiological images as listed and agreed with the findings in the report. Nm Pet Image Initial (pi) Skull Base To Thigh  Result Date: 08/22/2018 CLINICAL DATA:  Subsequent treatment strategy for restaging of B-cell lymphoma. Initially diagnosed with lymphoma in 2011. EXAM: NUCLEAR MEDICINE PET SKULL BASE TO THIGH TECHNIQUE: 10.0 mCi F-18 FDG was injected intravenously. Full-ring PET imaging was performed from the skull base to thigh after the radiotracer. CT data was obtained and used for attenuation correction and  anatomic localization. Fasting blood glucose: 84 mg/dl COMPARISON:  09/22/2009 PET.  07/18/2018 abdominopelvic CT FINDINGS: Mediastinal blood pool activity: SUV max 2.3 NECK: No areas of abnormal hypermetabolism. Incidental CT findings: Bilateral carotid atherosclerosis. No cervical adenopathy. CHEST: No pulmonary parenchymal or thoracic nodal hypermetabolism. Incidental CT findings: Mild cardiomegaly. Aortic atherosclerosis. Tiny hiatal hernia. Small bilateral thyroid nodules are nonspecific. No thoracic adenopathy. Inferior left-sided pleural fat proliferation versus small left-sided Bochdalek's hernia. ABDOMEN/PELVIS: Hypermetabolism corresponding to the previously described abdominopelvic adenopathy. Left periaortic adenopathy at up to 2.2 cm and a S.U.V. max of 35.3 on image 128/4. This node is increased from 1.3 cm on the prior. Right external iliac adenopathy measures 4.4 cm and a S.U.V. max of 39.3 on image 167/4. Compare 3.2 cm on the prior diagnostic CT. Right inguinal node measures 5.4 x 5.6 cm and a S.U.V. max of 43.7 on image 185/4. Enlarged from 4.4 x 4.0 cm on 07/18/2018. Focus of hypermetabolism within the anus is without CT correlate. Incidental CT findings: Abdominal aortic atherosclerosis. Moderate hepatic steatosis. Cholecystectomy. Left renal cyst. Pelvic floor laxity. SKELETON: Focus of eighth right lateral rib hypermetabolism is without CT correlate, including at a S.U.V. max of 8.8 on image 91/4. A left transverse process focus of hypermetabolism measures a S.U.V. max of 6.1 at approximately the T6 level. Incidental CT findings: none IMPRESSION: 1. Progression, since 57/07/7791, of hypermetabolic abdominopelvic adenopathy, consistent with active lymphoma. 2. CT occult hypermetabolic osseous foci, also most consistent with active lymphoma. 3. No evidence of soft tissue disease above the diaphragm. 4. Focus of hypermetabolism within the anus is likely physiologic. Consider physical exam  correlation. 5.  Aortic Atherosclerosis (ICD10-I70.0). Electronically Signed   By: Abigail Miyamoto M.D.   On: 08/22/2018 11:56   [place] Core Biopsy (lymph Nodes)  Result Date: 08/06/2018 INDICATION: History  of lymphoma. Adenopathy including right inguinal adenopathy. EXAM: ULTRASOUND GUIDED CORE BIOPSY OF RIGHT INGUINAL ADENOPATHY MEDICATIONS: Lidocaine 1% subcutaneous ANESTHESIA/SEDATION: Patient declined sedation PROCEDURE: The procedure, risks, benefits, and alternatives were explained to the patient. Questions regarding the procedure were encouraged and answered. The patient understands and consents to the procedure. Survey ultrasound of the right inguinal region performed. The adenopathy was localized and an appropriate skin entry site determined and marked. The operative field was prepped with chlorhexidine in a sterile fashion, and a sterile drape was applied covering the operative field. A sterile gown and sterile gloves were used for the procedure. Local anesthesia was provided with 1% Lidocaine. Under real-time ultrasound guidance, a 17 gauge trocar needle was advanced to the margin of the lesion. Once needle tip position was confirmed, coaxial 18-gauge core biopsy samples were obtained, submitted in saline to surgical pathology. The guide needle was removed. Follow-up scan shows no hemorrhage or other apparent complication. The patient tolerated the procedure well. COMPLICATIONS: None immediate. FINDINGS: Right inguinal adenopathy was localized. Representative core biopsy samples obtained as above. IMPRESSION: 1. Technically successful ultrasound-guided core biopsy, right inguinal adenopathy. Electronically Signed   By: Lucrezia Europe M.D.   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 Port-A-Cath needed for treatment. EXAM: FLUOROSCOPIC AND ULTRASOUND GUIDED PLACEMENT OF A SUBCUTANEOUS PORT COMPARISON:  None.  MEDICATIONS: Ancef 2 g; The antibiotic was administered within an appropriate time interval prior to skin puncture. ANESTHESIA/SEDATION: Versed 2.0 mg IV; Fentanyl 100 mcg IV; Moderate Sedation Time:  30 minutes The patient was continuously monitored during the procedure by the interventional radiology nurse under my direct supervision. FLUOROSCOPY TIME:  12 seconds, 3 mGy COMPLICATIONS: None immediate. PROCEDURE: The procedure, risks, benefits, and alternatives were explained to the patient. Questions regarding the procedure were encouraged and answered. The patient understands and consents to the procedure. Patient was placed supine on the interventional table. Ultrasound confirmed a patent right internal jugular vein. Ultrasound image was saved for documentation. The right chest and neck were cleaned with a skin antiseptic and a sterile drape was placed. Maximal barrier sterile technique was utilized including caps, mask, sterile gowns, sterile gloves, sterile drape, hand hygiene and skin antiseptic. The right neck was anesthetized with 1% lidocaine. Small incision was made in the right neck with a blade. Micropuncture set was placed in the right internal jugular vein with ultrasound guidance. The micropuncture wire was used for measurement purposes. The right chest was anesthetized with 1% lidocaine with epinephrine. #15 blade was used to make an incision and a subcutaneous port pocket was formed. [place] was assembled. Subcutaneous tunnel was formed with a stiff tunneling device. The port catheter was brought through the subcutaneous tunnel. The port was placed in the subcutaneous pocket and sutured in place. The micropuncture set was exchanged for a peel-away sheath. The catheter was placed through the peel-away sheath and the tip was positioned at the superior cavoatrial junction. Catheter placement was confirmed with fluoroscopy. The port was accessed and flushed with heparinized saline. The port  pocket was closed using two layers of absorbable sutures and Dermabond. The vein skin site was closed using a single layer of absorbable suture and Dermabond. Sterile dressings were applied. Patient tolerated the procedure well without an immediate complication. Ultrasound and fluoroscopic images were taken and saved for this procedure. IMPRESSION: Placement of a subcutaneous port device. Catheter tip at the superior cavoatrial junction.  Electronically Signed   By: Markus Daft M.D.   On: 08/23/2018 16:37   Vas [place] Lower Extremity Venous (dvt)  Result Date: 08/14/2018  Lower Venous Study Indications: Swelling.  Limitations: Poor ultrasound/tissue interface. Performing Technologist: Oliver Hum, J  Examination Guidelines: A complete evaluation includes B-mode imaging, spectral Doppler, color Doppler, and power Doppler as needed of all accessible portions of each vessel. Bilateral testing is considered an integral part of a complete examination. Limited examinations for reoccurring indications may be performed as noted.  Right Venous Findings: +---------+---------------+---------+-----------+----------+-------+          CompressibilityPhasicitySpontaneityPropertiesSummary +---------+---------------+---------+-----------+----------+-------+ CFV      Full           Yes      Yes                          +---------+---------------+---------+-----------+----------+-------+ SFJ      Full                                                 +---------+---------------+---------+-----------+----------+-------+ FV Prox  Full                                                 +---------+---------------+---------+-----------+----------+-------+ FV Mid   Full                                                 +---------+---------------+---------+-----------+----------+-------+ FV DistalFull                                                  +---------+---------------+---------+-----------+----------+-------+ PFV      Full                                                 +---------+---------------+---------+-----------+----------+-------+ POP      Full           Yes      Yes                          +---------+---------------+---------+-----------+----------+-------+ PTV      Full                                                 +---------+---------------+---------+-----------+----------+-------+ PERO     Full                                                 +---------+---------------+---------+-----------+----------+-------+  Left Venous Findings: +---+---------------+---------+-----------+----------+-------+    CompressibilityPhasicitySpontaneityPropertiesSummary +---+---------------+---------+-----------+----------+-------+ CFVFull  Yes      Yes                          +---+---------------+---------+-----------+----------+-------+    Summary: Right: There is no evidence of deep vein thrombosis in the lower extremity. No cystic structure found in the popliteal fossa. There is evidence of a large, mix echogenic area in the right groin measuring 4.9 cm high by greater than 4.6 cm wide by greater  than 5.8 cm long.  *See table(s) above for measurements and observations. Electronically signed by Deitra Mayo MD on 08/14/2018 at 5:43:43 AM.    Final     ASSESSMENT & PLAN:  78 y.o. female with  1. 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 Newly Diagnosed Recurrent/New High Grade B-Cell  Non-Hodgkin's Lymphoma, Stage IV  FISH negative for findings of double hit lymphoma  07/18/18 CT A/P revealed Extensive abdominal-pelvic adenopathy, consistent with recurrent lymphoma. 2. Tiny hiatal hernia. 3. Pelvic floor laxity. Aortic Atherosclerosis.  Labs upon initial presentation from 08/06/18, WBC normal at 4.3k, HGB at 10.6, PLT normal at 201k  08/06/18 Right Inguinal LN Biopsy which revealed Large B-Cell Lymphoma with a Ki67 of 90%.  FISH is negative for rearrangements of BCL2, BCL6, and MYC. 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  08/13/18 Hep B and Hep C negative  08/21/18 ECHO which revealed LV EF of 50-55%  08/22/18 PET/CT revealed Progression, since 61/44/3154, of hypermetabolic abdominopelvic adenopathy, consistent with active lymphoma. 2. CT occult hypermetabolic osseous foci, also most consistent with active lymphoma. 3. No evidence of soft tissue disease above the diaphragm. 4. Focus of hypermetabolism within the anus is likely physiologic. Consider physical exam correlation. 5. Aortic Atherosclerosis.  3. RLE swelling r/o DVT. Could be from lymphedema related to her new/recurrent NHL  08/13/08 US Venous RLE to rule out DVT - reviewed - no VTE  4. Hypokalemia K 3.1 -- will monitor and replace as needed PLAN: -Discussed pt labwork today, 09/02/18; potassium lower at 3.1, other blood counts and chemistries are stable -The pt has no prohibitive toxicities from beginning C1D1 dose reduced EPOCH-R at this time in keeping with her age and prior CTx exposure. -Continue eating well, staying hydrated, and staying as active as reasonably possible  -Outpatient Neulasta and Rituxan on 09/09/18 -Will gauge indications for larger doses pending her display of tolerance  -Will evaluate pt with PET/CT after C2 or C3 for evaluation of treatment efficacy -Allopurinol 141m po daily for TLS prophylaxis with C1 of treatment.  5.  Patient Active Problem List    Diagnosis Date Noted  . Diffuse large B cell lymphoma ([place] 08/29/2018  . Counseling regarding advance care planning and goals of care 08/29/2018  . 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[facility] [place] 08/29/2018  . Hx of diverticulitis of colon 12/12/2011  . Leukopenia 12/12/2011  . Syncope 12/12/2011  . HH (hiatus hernia) 12/12/2011  . 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 GERD 05/12/2008    All of the patients questions were answered with apparent satisfaction. The patient knows to call the  clinic with any problems, questions or concerns.     Sullivan Lone MD MS AAHIVMS [facility] [place] [facility] Hematology/Oncology Physician [facility]  (Office):       [phone] (Work cell):  [phone] (Fax):           [phone]  09/02/2018 2:42 PM  I, Baldwin [place], am acting as a scribe for Dr. Sullivan Lone.   .I have reviewed the above documentation for accuracy and completeness, and I agree with the above. Sullivan Lone MD MS

## 2018-09-02 NOTE — Progress Notes (Signed)
Calculations and dosage of etoposide, doxorubicin and vincristine verified by another chemo certified nurse.

## 2018-09-03 DIAGNOSIS — Z5111 Encounter for antineoplastic chemotherapy: Secondary | ICD-10-CM

## 2018-09-03 DIAGNOSIS — Z7189 Other specified counseling: Secondary | ICD-10-CM

## 2018-09-03 LAB — CBC
HCT: 27.9 % — ABNORMAL LOW (ref 36.0–46.0)
HEMOGLOBIN: 8.7 g/dL — AB (ref 12.0–15.0)
MCH: 28.1 pg (ref 26.0–34.0)
MCHC: 31.2 g/dL (ref 30.0–36.0)
MCV: 90 fL (ref 80.0–100.0)
Platelets: 112 10*3/uL — ABNORMAL LOW (ref 150–400)
RBC: 3.1 MIL/uL — ABNORMAL LOW (ref 3.87–5.11)
RDW: 15.1 % (ref 11.5–15.5)
WBC: 3.4 10*3/uL — ABNORMAL LOW (ref 4.0–10.5)
nRBC: 0 % (ref 0.0–0.2)

## 2018-09-03 LAB — BASIC METABOLIC PANEL
Anion gap: 8 (ref 5–15)
BUN: 23 mg/dL (ref 8–23)
CO2: 23 mmol/L (ref 22–32)
Calcium: 9.1 mg/dL (ref 8.9–10.3)
Chloride: 108 mmol/L (ref 98–111)
Creatinine, Ser: 0.9 mg/dL (ref 0.44–1.00)
GFR calc Af Amer: >60 mL/min (ref >60)
GFR calc non Af Amer: >60 mL/min (ref >60)
Glucose, Bld: 130 mg/dL — ABNORMAL HIGH (ref 70–99)
Potassium: 3.8 mmol/L (ref 3.5–5.1)
Sodium: 139 mmol/L (ref 135–145)

## 2018-09-03 LAB — URIC ACID: Uric Acid, Serum: 6.2 mg/dL (ref 2.5–7.1)

## 2018-09-03 MED ORDER — VINCRISTINE SULFATE CHEMO INJECTION 1 MG/ML
Freq: Once | INTRAVENOUS | Status: AC
  Administered 2018-09-03: 15:00:00 via INTRAVENOUS
  Filled 2018-09-03: qty 6

## 2018-09-03 MED ORDER — SODIUM CHLORIDE 0.9 % IV SOLN
Freq: Once | INTRAVENOUS | Status: AC
  Administered 2018-09-03: 18 mg via INTRAVENOUS
  Filled 2018-09-03: qty 4

## 2018-09-03 NOTE — Progress Notes (Signed)
HEMATOLOGY/ONCOLOGY INPATIENT PROGRESS NOTE  Date of Service: 09/03/2018  Inpatient Attending: .Brunetta Genera, MD   SUBJECTIVE:   [place] reports that she is doing well overall.   The pt reports that she has not developed any concerns. She notes that she is eating fair. She notes that her right leg swelling has been stable, if not a little improved. She denies leg pain. She denies any nausea, vomiting or diarrhea.  Lab results today (09/03/18) of CBC w/diff and CMP is as follows: all values are WNL except for WBC at 3.4k, RBC at 3.10, HGB at 8.7 HCT at 27.9, PLT at 112k, Glucose at 130 09/03/18 Uric acid at 6.2  On review of systems, pt reports eating fair, improving right leg swelling, and denies leg pain, mouth soreness, nausea, vomiting, diarrhea, and any other symptoms.    OBJECTIVE:  NAD  PHYSICAL EXAMINATION: . Vitals:   09/02/18 1411 09/02/18 2039 09/03/18 0514 09/03/18 1246  BP: (!) 146/68 (!) 116/56 130/68 (!) 122/55  Pulse: 77 77 72 72  Resp: '20 18 17 18  ' Temp: 98.7 F (37.1 C) 98.3 F (36.8 C) 97.8 F (36.6 C) 98.1 F (36.7 C)  TempSrc: Oral Oral Oral Oral  SpO2: 97% 97% 97% 99%  Weight:      Height:       Filed Weights   09/02/18 0910  Weight: 181 lb 1.8 oz (82.2 kg)   .Body mass index is 31.09 kg/m.  GENERAL:alert, in no acute distress and comfortable SKIN: skin color, texture, turgor are normal, no rashes or significant lesions EYES: normal, conjunctiva are pink and non-injected, sclera clear OROPHARYNX:no exudate, no erythema and lips, buccal mucosa, and tongue normal  NECK: supple, no JVD, thyroid normal size, non-tender, without nodularity LYMPH:  no palpable lymphadenopathy in the cervical, axillary or inguinal LUNGS: clear to auscultation with normal respiratory effort HEART: regular rate & rhythm,  no murmurs and no lower extremity edema ABDOMEN: abdomen soft, non-tender, normoactive bowel sounds  Musculoskeletal: no  cyanosis of digits and no clubbing  PSYCH: alert & oriented x 3 with fluent speech NEURO: no focal motor/sensory deficits  MEDICAL HISTORY:  Past Medical History:  Diagnosis Date  . GERD (gastroesophageal reflux disease)   . HH (hiatus hernia) 12/12/2011  . Hypertension   . Leukopenia 12/12/2011  . nhl dx'd 2010  . Seizures ([place]) reports "years ago"  . Syncope 12/12/2011   Hx seizures - not documented - age 62; recent syncope 6/12 and x2, 12/12; negative MRI brain 1/13    SURGICAL HISTORY: Past Surgical History:  Procedure Laterality Date  . COLON SURGERY    . IR IMAGING GUIDED PORT INSERTION  08/23/2018    SOCIAL HISTORY: Social History   Socioeconomic History  . Marital status: Married    Spouse name: Not on file  . Number of children: Not on file  . Years of education: Not on file  . Highest education level: Not on file  Occupational History  . Not on file  Social Needs  . Financial resource strain: Not on file  . Food insecurity:    Worry: Not on file    Inability: Not on file  . Transportation needs:    Medical: Not on file    Non-medical: Not on file  Tobacco Use  . Smoking status: Never Smoker  . Smokeless tobacco: Never Used  Substance and Sexual Activity  . Alcohol use: No  . Drug use: No  . Sexual activity: Not  on file  Lifestyle  . Physical activity:    Days per week: Not on file    Minutes per session: Not on file  . Stress: Not on file  Relationships  . Social connections:    Talks on phone: Not on file    Gets together: Not on file    Attends religious service: Not on file    Active member of club or organization: Not on file    Attends meetings of clubs or organizations: Not on file    Relationship status: Not on file  . Intimate partner violence:    Fear of current or ex partner: Not on file    Emotionally abused: Not on file    Physically abused: Not on file    Forced sexual activity: Not on file  Other Topics Concern  . Not on file    Social History Narrative  . Not on file    FAMILY HISTORY: History reviewed. No pertinent family history.  ALLERGIES:  has No Known Allergies.  MEDICATIONS:  Scheduled Meds: . allopurinol  150 mg Oral Daily  . amLODipine  10 mg Oral Daily  . DOXOrubicin/vinCRIStine/etoposide CHEMO IV infusion for Inpatient CI   Intravenous Once  . enoxaparin (LOVENOX) injection  40 mg Subcutaneous Q24H  . pantoprazole  40 mg Oral Daily  . predniSONE  60 mg Oral QAC breakfast  . [START ON 09/04/2018] vitamin B-12  1,000 mcg Oral QODAY   Continuous Infusions: . sodium chloride 20 mL/hr at 09/03/18 0300   PRN Meds:.acetaminophen, alum & mag hydroxide-simeth, Cold Pack, Hot Pack, lidocaine-prilocaine  REVIEW OF SYSTEMS:    10 Point review of Systems was done is negative except as noted above.   LABORATORY DATA:  I have reviewed the data as listed  . CBC Latest Ref Rng & Units 09/03/2018 09/02/2018 08/23/2018  WBC 4.0 - 10.5 K/uL 3.4(L) 3.9(L) 5.3  Hemoglobin 12.0 - 15.0 g/dL 8.7(L) 9.3(L) 11.0(L)  Hematocrit 36.0 - 46.0 % 27.9(L) 30.5(L) 35.4(L)  Platelets 150 - 400 K/uL 112(L) 130(L) 266    . CMP Latest Ref Rng & Units 09/03/2018 09/02/2018 08/13/2018  Glucose 70 - 99 mg/dL 130(H) 106(H) 92  BUN 8 - 23 mg/dL '23 23 12  ' Creatinine 0.44 - 1.00 mg/dL 0.90 1.04(H) 0.76  Sodium 135 - 145 mmol/L 139 138 142  Potassium 3.5 - 5.1 mmol/L 3.8 3.1(L) 3.5  Chloride 98 - 111 mmol/L 108 107 108  CO2 22 - 32 mmol/L '23 24 23  ' Calcium 8.9 - 10.3 mg/dL 9.1 9.0 9.2  Total Protein 6.5 - 8.1 g/dL - 6.0(L) 7.3  Total Bilirubin 0.3 - 1.2 mg/dL - 0.8 0.4  Alkaline Phos 38 - 126 U/L - 49 78  AST 15 - 41 U/L - 14(L) 14(L)  ALT 0 - 44 U/L - 13 13     RADIOGRAPHIC STUDIES: I have personally reviewed the radiological images as listed and agreed with the findings in the report. Nm Pet Image Initial (pi) Skull Base To Thigh  Result Date: 08/22/2018 CLINICAL DATA:  Subsequent treatment strategy for restaging of  B-cell lymphoma. Initially diagnosed with lymphoma in 2011. EXAM: NUCLEAR MEDICINE PET SKULL BASE TO THIGH TECHNIQUE: 10.0 mCi F-18 FDG was injected intravenously. Full-ring PET imaging was performed from the skull base to thigh after the radiotracer. CT data was obtained and used for attenuation correction and anatomic localization. Fasting blood glucose: 84 mg/dl COMPARISON:  09/22/2009 PET.  07/18/2018 abdominopelvic CT FINDINGS: Mediastinal blood pool activity: SUV  max 2.3 NECK: No areas of abnormal hypermetabolism. Incidental CT findings: Bilateral carotid atherosclerosis. No cervical adenopathy. CHEST: No pulmonary parenchymal or thoracic nodal hypermetabolism. Incidental CT findings: Mild cardiomegaly. Aortic atherosclerosis. Tiny hiatal hernia. Small bilateral thyroid nodules are nonspecific. No thoracic adenopathy. Inferior left-sided pleural fat proliferation versus small left-sided Bochdalek's hernia. ABDOMEN/PELVIS: Hypermetabolism corresponding to the previously described abdominopelvic adenopathy. Left periaortic adenopathy at up to 2.2 cm and a S.U.V. max of 35.3 on image 128/4. This node is increased from 1.3 cm on the prior. Right external iliac adenopathy measures 4.4 cm and a S.U.V. max of 39.3 on image 167/4. Compare 3.2 cm on the prior diagnostic CT. Right inguinal node measures 5.4 x 5.6 cm and a S.U.V. max of 43.7 on image 185/4. Enlarged from 4.4 x 4.0 cm on 07/18/2018. Focus of hypermetabolism within the anus is without CT correlate. Incidental CT findings: Abdominal aortic atherosclerosis. Moderate hepatic steatosis. Cholecystectomy. Left renal cyst. Pelvic floor laxity. SKELETON: Focus of eighth right lateral rib hypermetabolism is without CT correlate, including at a S.U.V. max of 8.8 on image 91/4. A left transverse process focus of hypermetabolism measures a S.U.V. max of 6.1 at approximately the T6 level. Incidental CT findings: none IMPRESSION: 1. Progression, since 07/18/2018, of  hypermetabolic abdominopelvic adenopathy, consistent with active lymphoma. 2. CT occult hypermetabolic osseous foci, also most consistent with active lymphoma. 3. No evidence of soft tissue disease above the diaphragm. 4. Focus of hypermetabolism within the anus is likely physiologic. Consider physical exam correlation. 5.  Aortic Atherosclerosis (ICD10-I70.0). Electronically Signed   By: Abigail Miyamoto M.D.   On: 08/22/2018 11:56   [place] Core Biopsy (lymph Nodes)  Result Date: 08/06/2018 INDICATION: History of lymphoma. Adenopathy including right inguinal adenopathy. EXAM: ULTRASOUND GUIDED CORE BIOPSY OF RIGHT INGUINAL ADENOPATHY MEDICATIONS: Lidocaine 1% subcutaneous ANESTHESIA/SEDATION: Patient declined sedation PROCEDURE: The procedure, risks, benefits, and alternatives were explained to the patient. Questions regarding the procedure were encouraged and answered. The patient understands and consents to the procedure. Survey ultrasound of the right inguinal region performed. The adenopathy was localized and an appropriate skin entry site determined and marked. The operative field was prepped with chlorhexidine in a sterile fashion, and a sterile drape was applied covering the operative field. A sterile gown and sterile gloves were used for the procedure. Local anesthesia was provided with 1% Lidocaine. Under real-time ultrasound guidance, a 17 gauge trocar needle was advanced to the margin of the lesion. Once needle tip position was confirmed, coaxial 18-gauge core biopsy samples were obtained, submitted in saline to surgical pathology. The guide needle was removed. Follow-up scan shows no hemorrhage or other apparent complication. The patient tolerated the procedure well. COMPLICATIONS: None immediate. FINDINGS: Right inguinal adenopathy was localized. Representative core biopsy samples obtained as above. IMPRESSION: 1. Technically successful ultrasound-guided core biopsy, right inguinal adenopathy.  Electronically Signed   By: Lucrezia Europe M.D.   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 Port-A-Cath needed for treatment. EXAM: FLUOROSCOPIC AND ULTRASOUND GUIDED PLACEMENT OF A SUBCUTANEOUS PORT COMPARISON:  None. MEDICATIONS: Ancef 2 g; The antibiotic was administered within an appropriate time interval prior to skin puncture. ANESTHESIA/SEDATION: Versed 2.0 mg IV; Fentanyl 100 mcg IV; Moderate Sedation Time:  30 minutes The patient was continuously monitored during the procedure by the interventional radiology nurse under my direct supervision. FLUOROSCOPY TIME:  12 seconds, 3 mGy COMPLICATIONS: None immediate. PROCEDURE: The procedure, risks, benefits, and  alternatives were explained to the patient. Questions regarding the procedure were encouraged and answered. The patient understands and consents to the procedure. Patient was placed supine on the interventional table. Ultrasound confirmed a patent right internal jugular vein. Ultrasound image was saved for documentation. The right chest and neck were cleaned with a skin antiseptic and a sterile drape was placed. Maximal barrier sterile technique was utilized including caps, mask, sterile gowns, sterile gloves, sterile drape, hand hygiene and skin antiseptic. The right neck was anesthetized with 1% lidocaine. Small incision was made in the right neck with a blade. Micropuncture set was placed in the right internal jugular vein with ultrasound guidance. The micropuncture wire was used for measurement purposes. The right chest was anesthetized with 1% lidocaine with epinephrine. #15 blade was used to make an incision and a subcutaneous port pocket was formed. Moroni was assembled. Subcutaneous tunnel was formed with a stiff tunneling device. The port catheter was brought through the subcutaneous tunnel. The port was placed in the subcutaneous pocket  and sutured in place. The micropuncture set was exchanged for a peel-away sheath. The catheter was placed through the peel-away sheath and the tip was positioned at the superior cavoatrial junction. Catheter placement was confirmed with fluoroscopy. The port was accessed and flushed with heparinized saline. The port pocket was closed using two layers of absorbable sutures and Dermabond. The vein skin site was closed using a single layer of absorbable suture and Dermabond. Sterile dressings were applied. Patient tolerated the procedure well without an immediate complication. Ultrasound and fluoroscopic images were taken and saved for this procedure. IMPRESSION: Placement of a subcutaneous port device. Catheter tip at the superior cavoatrial junction. Electronically Signed   By: Markus Daft M.D.   On: 08/23/2018 16:37   Vas [place] Lower Extremity Venous (dvt)  Result Date: 08/14/2018  Lower Venous Study Indications: Swelling.  Limitations: Poor ultrasound/tissue interface. Performing Technologist: Oliver Hum, J  Examination Guidelines: A complete evaluation includes B-mode imaging, spectral Doppler, color Doppler, and power Doppler as needed of all accessible portions of each vessel. Bilateral testing is considered an integral part of a complete examination. Limited examinations for reoccurring indications may be performed as noted.  Right Venous Findings: +---------+---------------+---------+-----------+----------+-------+          CompressibilityPhasicitySpontaneityPropertiesSummary +---------+---------------+---------+-----------+----------+-------+ CFV      Full           Yes      Yes                          +---------+---------------+---------+-----------+----------+-------+ SFJ      Full                                                 +---------+---------------+---------+-----------+----------+-------+ FV Prox  Full                                                  +---------+---------------+---------+-----------+----------+-------+ FV Mid   Full                                                 +---------+---------------+---------+-----------+----------+-------+  FV DistalFull                                                 +---------+---------------+---------+-----------+----------+-------+ PFV      Full                                                 +---------+---------------+---------+-----------+----------+-------+ POP      Full           Yes      Yes                          +---------+---------------+---------+-----------+----------+-------+ PTV      Full                                                 +---------+---------------+---------+-----------+----------+-------+ PERO     Full                                                 +---------+---------------+---------+-----------+----------+-------+  Left Venous Findings: +---+---------------+---------+-----------+----------+-------+    CompressibilityPhasicitySpontaneityPropertiesSummary +---+---------------+---------+-----------+----------+-------+ CFVFull           Yes      Yes                          +---+---------------+---------+-----------+----------+-------+    Summary: Right: There is no evidence of deep vein thrombosis in the lower extremity. No cystic structure found in the popliteal fossa. There is evidence of a large, mix echogenic area in the right groin measuring 4.9 cm high by greater than 4.6 cm wide by greater  than 5.8 cm long.  *See table(s) above for measurements and observations. Electronically signed by Deitra Mayo MD on 08/14/2018 at 5:43:43 AM.    Final     ASSESSMENT & PLAN:  78 y.o. female with  1. 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 Newly Diagnosed Recurrent/New High Grade B-Cell Non-Hodgkin's Lymphoma, Stage IV FISH negative for findings of double hit lymphoma  07/18/18 CT A/P revealed Extensive abdominal-pelvic adenopathy, consistent with recurrent lymphoma. 2. Tiny hiatal hernia. 3. Pelvic floor laxity. Aortic Atherosclerosis.  Labs upon initial presentation from1/14/20, WBC normal at 4.3k, HGB at 10.6, PLT normal at 201k  08/06/18 Right Inguinal LN Biopsy which revealed Large B-Cell Lymphoma with a Ki67 of 90%.  FISH is negative for rearrangements of BCL2, BCL6, and MYC. 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  08/13/18 Hep B and Hep C negative  08/21/18 ECHO which revealed LV EF of 50-55%  08/22/18 PET/CT revealedProgression, since 75/91/6384, of hypermetabolic  abdominopelvic adenopathy, consistent with active lymphoma. 2. CT occult hypermetabolic osseous foci, also most consistent with active lymphoma. 3. No evidence of soft tissue disease above the diaphragm. 4. Focus of hypermetabolism within the anus is likely physiologic. Consider physical exam correlation. 5. Aortic Atherosclerosis.  3. RLE swelling r/o DVT. Could be from lymphedema related to her new/recurrent NHL 1/21/10US Venous RLE to rule out DVT - reviewed - no VTE  4. Hypokalemia resolved K 3.8 today.  PLAN: -Discussed pt labwork today, 09/03/18; Uric acid wnl. Blood counts and chemistries are stable.  -The pt has no prohibitive toxicities from continuing C1D2 dose reduced EPOCH-R at this time. Dose reduced due to age and previous chemotherapy exposure -Continue eating well, staying hydrated, and staying as active as reasonably possible  -Allopurinol 151m po daily for TLS prophylaxis with C1  of treatment  -Outpatient Neulasta and Rituxan on 09/09/18 -Will gauge indications for larger doses pending her display of tolerance  -Will evaluate pt with PET/CT after C2 or C3 for evaluation of treatment efficacy.  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 No evidence of bleeding  6. DVT Px - lovenox [place]   The total time spent in the appt was 25 minutes and more than 50% was on counseling and direct patient cares.    GSullivan LoneMD MS AAHIVMS SBethesda Rehabilitation HospitalCWest Tennessee Healthcare Rehabilitation HospitalHematology/Oncology Physician CMedical City [place] (Office):       [phone](Work cell):  [phone](Fax):           [phone] 09/03/2018 3:34 PM   I, SBaldwin Jamaica am acting as a scribe for Dr. GSullivan Lone   .I have reviewed the above documentation for accuracy and completeness, and I agree with the above. GSullivan LoneMD MS

## 2018-09-03 NOTE — Progress Notes (Signed)
09/03/18  Pharmacy will place chemotherapy in Normal Saline 1000 mL for 22 hour infusion.  Confirmed with nursing this is the desired volume.  Gordy Levan RN/Caila Cirelli Ronnald Ramp, PharmD

## 2018-09-04 LAB — BASIC METABOLIC PANEL
ANION GAP: 9 (ref 5–15)
BUN: 26 mg/dL — ABNORMAL HIGH (ref 8–23)
CALCIUM: 9.2 mg/dL (ref 8.9–10.3)
CO2: 22 mmol/L (ref 22–32)
Chloride: 113 mmol/L — ABNORMAL HIGH (ref 98–111)
Creatinine, Ser: 0.99 mg/dL (ref 0.44–1.00)
GFR calc Af Amer: >60 mL/min (ref >60)
GFR calc non Af Amer: 54 mL/min — ABNORMAL LOW (ref >60)
Glucose, Bld: 128 mg/dL — ABNORMAL HIGH (ref 70–99)
Potassium: 3.8 mmol/L (ref 3.5–5.1)
Sodium: 144 mmol/L (ref 135–145)

## 2018-09-04 LAB — CBC
HCT: 26.1 % — ABNORMAL LOW (ref 36.0–46.0)
Hemoglobin: 8.1 g/dL — ABNORMAL LOW (ref 12.0–15.0)
MCH: 28.5 pg (ref 26.0–34.0)
MCHC: 31 g/dL (ref 30.0–36.0)
MCV: 91.9 fL (ref 80.0–100.0)
PLATELETS: 129 10*3/uL — AB (ref 150–400)
RBC: 2.84 MIL/uL — AB (ref 3.87–5.11)
RDW: 15.2 % (ref 11.5–15.5)
WBC: 5.8 10*3/uL (ref 4.0–10.5)
nRBC: 0 % (ref 0.0–0.2)

## 2018-09-04 LAB — URIC ACID: Uric Acid, Serum: 5.6 mg/dL (ref 2.5–7.1)

## 2018-09-04 MED ORDER — VINCRISTINE SULFATE CHEMO INJECTION 1 MG/ML
Freq: Once | INTRAVENOUS | Status: AC
  Administered 2018-09-04: 14:00:00 via INTRAVENOUS
  Filled 2018-09-04: qty 6

## 2018-09-04 MED ORDER — SENNOSIDES-DOCUSATE SODIUM 8.6-50 MG PO TABS
2.0000 | ORAL_TABLET | Freq: Two times a day (BID) | ORAL | Status: DC
  Administered 2018-09-04 – 2018-09-05 (×2): 2 via ORAL
  Filled 2018-09-04 (×4): qty 2

## 2018-09-04 MED ORDER — SODIUM CHLORIDE 0.9 % IV SOLN
Freq: Once | INTRAVENOUS | Status: AC
  Administered 2018-09-04: 18 mg via INTRAVENOUS
  Filled 2018-09-04: qty 4

## 2018-09-04 MED ORDER — POLYETHYLENE GLYCOL 3350 17 G PO PACK
17.0000 g | PACK | Freq: Every day | ORAL | Status: DC
  Administered 2018-09-05: 17 g via ORAL
  Filled 2018-09-04 (×2): qty 1

## 2018-09-04 NOTE — H&P (Signed)
HEMATOLOGY/ONCOLOGY H&P NOTE  Date of Service: 09/04/2018  Patient Care Team: [name], MD as PCP - General (Internal Medicine)  CHIEF COMPLAINTS/PURPOSE OF CONSULTATION:  C1 dose reduced EPOCH-R Treatment of High Grade B-Cell Non-Hodgkin's Lymphoma  HISTORY OF PRESENTING ILLNESS:   Taylor Huynh is a wonderful 78 y.o. female who has been admitted today for C1 dose reduced EPOCH-R treatment for her High Grade B-Cell Non-Hodgkin's Lymphoma. She is accompanied today by her daughter and granddaughter at bedside. The pt reports that she is doing well overall.   The pt reports that she has had a weaker appetite in the interim. She notes that her right leg continues to be swollen, but has improved slightly. She denies concerns for infections and denies abdominal pains. The pt notes that she is eating about 80% as much as she had been.  Lab results today (09/02/18) of CBC w/diff and CMP is as follows: all values are WNL except for WBC at 3.9k, RBC at 3.34, HGB at 9.3, HCT at 30.5, PLT at 130k, Lymphs abs at 600, Potassium at 3.1, Glucose at 106, Creatinine at 1.04, Total Protein at 6.0, AST at 14, GFR at 59.  On review of systems, pt reports weaker appetite, improving right leg swelling, and denies concerns for infections, and any other symptoms.    MEDICAL HISTORY:  Past Medical History:  Diagnosis Date  . GERD (gastroesophageal reflux disease)   . HH (hiatus hernia) 12/12/2011  . Hypertension   . Leukopenia 12/12/2011  . nhl dx'd 2010  . Seizures ([place]) reports "years ago"  . Syncope 12/12/2011   Hx seizures - not documented - age 59; recent syncope 6/12 and x2, 12/12; negative MRI brain 1/13    SURGICAL HISTORY: Past Surgical History:  Procedure Laterality Date  . COLON SURGERY    . IR IMAGING GUIDED PORT INSERTION  08/23/2018    SOCIAL HISTORY: Social History   Socioeconomic History  . Marital status: Married    Spouse name: Not on file  . Number of  children: Not on file  . Years of education: Not on file  . Highest education level: Not on file  Occupational History  . Not on file  Social Needs  . Financial resource strain: Not on file  . Food insecurity:    Worry: Not on file    Inability: Not on file  . Transportation needs:    Medical: Not on file    Non-medical: Not on file  Tobacco Use  . Smoking status: Never Smoker  . Smokeless tobacco: Never Used  Substance and Sexual Activity  . Alcohol use: No  . Drug use: No  . Sexual activity: Not on file  Lifestyle  . Physical activity:    Days per week: Not on file    Minutes per session: Not on file  . Stress: Not on file  Relationships  . Social connections:    Talks on phone: Not on file    Gets together: Not on file    Attends religious service: Not on file    Active member of club or organization: Not on file    Attends meetings of clubs or organizations: Not on file    Relationship status: Not on file  . Intimate partner violence:    Fear of current or ex partner: Not on file    Emotionally abused: Not on file    Physically abused: Not on file    Forced sexual activity: Not on file  Other Topics Concern  . Not on file  Social History Narrative  . Not on file    FAMILY HISTORY: History reviewed. No pertinent family history.  ALLERGIES:  has No Known Allergies.  MEDICATIONS:  Current Facility-Administered Medications  Medication Dose Route Frequency Provider Last Rate Last Dose  . 0.9 %  sodium chloride infusion   Intravenous Continuous Brunetta Genera, MD 20 mL/hr at 09/03/18 0300    . acetaminophen (TYLENOL) tablet 650 mg  650 mg Oral Q4H PRN Brunetta Genera, MD      . allopurinol (ZYLOPRIM) tablet 150 mg  150 mg Oral Daily Brunetta Genera, MD   150 mg at 09/04/18 0930  . alum & mag hydroxide-simeth (MAALOX/MYLANTA) 200-200-20 MG/5ML suspension 60 mL  60 mL Oral Q4H PRN Brunetta Genera, MD      . amLODipine (NORVASC) tablet 10 mg  10  mg Oral Daily Brunetta Genera, MD   10 mg at 09/04/18 0930  . Cold Pack 1 packet  1 packet Topical Once PRN Brunetta Genera, MD      . DOXOrubicin (ADRIAMYCIN) 12 mg, etoposide (VEPESID) 62 mg, vinCRIStine (ONCOVIN) 0.8 mg in sodium chloride 0.9 % 1,000 mL chemo infusion   Intravenous Once Brunetta Genera, MD 51 mL/hr at 09/04/18 1337    . enoxaparin (LOVENOX) injection 40 mg  40 mg Subcutaneous Q24H Brunetta Genera, MD   40 mg at 09/04/18 0930  . Hot Pack 1 packet  1 packet Topical Once PRN Brunetta Genera, MD      . lidocaine-prilocaine (EMLA) cream 1 application  1 application Topical PRN Brunetta Genera, MD      . pantoprazole (PROTONIX) EC tablet 40 mg  40 mg Oral Daily Brunetta Genera, MD   40 mg at 09/04/18 0930  . polyethylene glycol (MIRALAX / GLYCOLAX) packet 17 g  17 g Oral Daily Brunetta Genera, MD      . predniSONE (DELTASONE) tablet 60 mg  60 mg Oral QAC breakfast Brunetta Genera, MD   60 mg at 09/04/18 0929  . senna-docusate (Senokot-S) tablet 2 tablet  2 tablet Oral BID Brunetta Genera, MD   2 tablet at 09/04/18 1646  . vitamin B-12 (CYANOCOBALAMIN) tablet 1,000 mcg  1,000 mcg Oral QODAY Brunetta Genera, MD   1,000 mcg at 09/04/18 0930    REVIEW OF SYSTEMS:    A 10+ POINT REVIEW OF SYSTEMS WAS OBTAINED including neurology, dermatology, psychiatry, cardiac, respiratory, lymph, extremities, GI, GU, Musculoskeletal, constitutional, breasts, reproductive, HEENT.  All pertinent positives are noted in the HPI.  All others are negative.   PHYSICAL EXAMINATION: ECOG PERFORMANCE STATUS: 2 - Symptomatic, <50% confined to bed  . Vitals:   09/04/18 1356 09/04/18 2140  BP: (!) 130/58 (!) 145/64  Pulse: (!) 59 (!) 58  Resp: 16 17  Temp: 97.9 F (36.6 C) (!) 97.5 F (36.4 C)  SpO2: 98% 97%   Filed Weights   09/02/18 0910  Weight: 181 lb 1.8 oz (82.2 kg)   .Body mass index is 31.09 kg/m.  GENERAL:alert, in no acute distress  and comfortable SKIN: no acute rashes, no significant lesions EYES: conjunctiva are pink and non-injected, sclera anicteric OROPHARYNX: MMM, no exudates, no oropharyngeal erythema or ulceration NECK: supple, no JVD LYMPH: Right inguinal 4-6cm LN, no palpable lymphadenopathy in the cervical or axillary regions LUNGS: clear to auscultation b/l with normal respiratory effort HEART: regular rate & rhythm ABDOMEN:  normoactive bowel  sounds , non tender, not distended. No palpable hepatosplenomegaly.  Extremity: no pedal edema PSYCH: alert & oriented x 3 with fluent speech NEURO: no focal motor/sensory deficits   LABORATORY DATA:  I have reviewed the data as listed  . CBC Latest Ref Rng & Units 09/04/2018 09/03/2018 09/02/2018  WBC 4.0 - 10.5 K/uL 5.8 3.4(L) 3.9(L)  Hemoglobin 12.0 - 15.0 g/dL 8.1(L) 8.7(L) 9.3(L)  Hematocrit 36.0 - 46.0 % 26.1(L) 27.9(L) 30.5(L)  Platelets 150 - 400 K/uL 129(L) 112(L) 130(L)    . CMP Latest Ref Rng & Units 09/04/2018 09/03/2018 09/02/2018  Glucose 70 - 99 mg/dL 128(H) 130(H) 106(H)  BUN 8 - 23 mg/dL 26(H) 23 23  Creatinine 0.44 - 1.00 mg/dL 0.99 0.90 1.04(H)  Sodium 135 - 145 mmol/L 144 139 138  Potassium 3.5 - 5.1 mmol/L 3.8 3.8 3.1(L)  Chloride 98 - 111 mmol/L 113(H) 108 107  CO2 22 - 32 mmol/L '22 23 24  ' Calcium 8.9 - 10.3 mg/dL 9.2 9.1 9.0  Total Protein 6.5 - 8.1 g/dL - - 6.0(L)  Total Bilirubin 0.3 - 1.2 mg/dL - - 0.8  Alkaline Phos 38 - 126 U/L - - 49  AST 15 - 41 U/L - - 14(L)  ALT 0 - 44 U/L - - 13     RADIOGRAPHIC STUDIES: I have personally reviewed the radiological images as listed and agreed with the findings in the report. Nm Pet Image Initial (pi) Skull Base To Thigh  Result Date: 08/22/2018 CLINICAL DATA:  Subsequent treatment strategy for restaging of B-cell lymphoma. Initially diagnosed with lymphoma in 2011. EXAM: NUCLEAR MEDICINE PET SKULL BASE TO THIGH TECHNIQUE: 10.0 mCi F-18 FDG was injected intravenously. Full-ring PET imaging  was performed from the skull base to thigh after the radiotracer. CT data was obtained and used for attenuation correction and anatomic localization. Fasting blood glucose: 84 mg/dl COMPARISON:  09/22/2009 PET.  07/18/2018 abdominopelvic CT FINDINGS: Mediastinal blood pool activity: SUV max 2.3 NECK: No areas of abnormal hypermetabolism. Incidental CT findings: Bilateral carotid atherosclerosis. No cervical adenopathy. CHEST: No pulmonary parenchymal or thoracic nodal hypermetabolism. Incidental CT findings: Mild cardiomegaly. Aortic atherosclerosis. Tiny hiatal hernia. Small bilateral thyroid nodules are nonspecific. No thoracic adenopathy. Inferior left-sided pleural fat proliferation versus small left-sided Bochdalek's hernia. ABDOMEN/PELVIS: Hypermetabolism corresponding to the previously described abdominopelvic adenopathy. Left periaortic adenopathy at up to 2.2 cm and a S.U.V. max of 35.3 on image 128/4. This node is increased from 1.3 cm on the prior. Right external iliac adenopathy measures 4.4 cm and a S.U.V. max of 39.3 on image 167/4. Compare 3.2 cm on the prior diagnostic CT. Right inguinal node measures 5.4 x 5.6 cm and a S.U.V. max of 43.7 on image 185/4. Enlarged from 4.4 x 4.0 cm on 07/18/2018. Focus of hypermetabolism within the anus is without CT correlate. Incidental CT findings: Abdominal aortic atherosclerosis. Moderate hepatic steatosis. Cholecystectomy. Left renal cyst. Pelvic floor laxity. SKELETON: Focus of eighth right lateral rib hypermetabolism is without CT correlate, including at a S.U.V. max of 8.8 on image 91/4. A left transverse process focus of hypermetabolism measures a S.U.V. max of 6.1 at approximately the T6 level. Incidental CT findings: none IMPRESSION: 1. Progression, since 59/56/3875, of hypermetabolic abdominopelvic adenopathy, consistent with active lymphoma. 2. CT occult hypermetabolic osseous foci, also most consistent with active lymphoma. 3. No evidence of soft tissue  disease above the diaphragm. 4. Focus of hypermetabolism within the anus is likely physiologic. Consider physical exam correlation. 5.  Aortic Atherosclerosis (ICD10-I70.0). Electronically  Signed   By: Abigail Miyamoto M.D.   On: 08/22/2018 11:56   [place] Core Biopsy (lymph Nodes)  Result Date: 08/06/2018 INDICATION: History of lymphoma. Adenopathy including right inguinal adenopathy. EXAM: ULTRASOUND GUIDED CORE BIOPSY OF RIGHT INGUINAL ADENOPATHY MEDICATIONS: Lidocaine 1% subcutaneous ANESTHESIA/SEDATION: Patient declined sedation PROCEDURE: The procedure, risks, benefits, and alternatives were explained to the patient. Questions regarding the procedure were encouraged and answered. The patient understands and consents to the procedure. Survey ultrasound of the right inguinal region performed. The adenopathy was localized and an appropriate skin entry site determined and marked. The operative field was prepped with chlorhexidine in a sterile fashion, and a sterile drape was applied covering the operative field. A sterile gown and sterile gloves were used for the procedure. Local anesthesia was provided with 1% Lidocaine. Under real-time ultrasound guidance, a 17 gauge trocar needle was advanced to the margin of the lesion. Once needle tip position was confirmed, coaxial 18-gauge core biopsy samples were obtained, submitted in saline to surgical pathology. The guide needle was removed. Follow-up scan shows no hemorrhage or other apparent complication. The patient tolerated the procedure well. COMPLICATIONS: None immediate. FINDINGS: Right inguinal adenopathy was localized. Representative core biopsy samples obtained as above. IMPRESSION: 1. Technically successful ultrasound-guided core biopsy, right inguinal adenopathy. Electronically Signed   By: Lucrezia Europe M.D.   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 Port-A-Cath  needed for treatment. EXAM: FLUOROSCOPIC AND ULTRASOUND GUIDED PLACEMENT OF A SUBCUTANEOUS PORT COMPARISON:  None. MEDICATIONS: Ancef 2 g; The antibiotic was administered within an appropriate time interval prior to skin puncture. ANESTHESIA/SEDATION: Versed 2.0 mg IV; Fentanyl 100 mcg IV; Moderate Sedation Time:  30 minutes The patient was continuously monitored during the procedure by the interventional radiology nurse under my direct supervision. FLUOROSCOPY TIME:  12 seconds, 3 mGy COMPLICATIONS: None immediate. PROCEDURE: The procedure, risks, benefits, and alternatives were explained to the patient. Questions regarding the procedure were encouraged and answered. The patient understands and consents to the procedure. Patient was placed supine on the interventional table. Ultrasound confirmed a patent right internal jugular vein. Ultrasound image was saved for documentation. The right chest and neck were cleaned with a skin antiseptic and a sterile drape was placed. Maximal barrier sterile technique was utilized including caps, mask, sterile gowns, sterile gloves, sterile drape, hand hygiene and skin antiseptic. The right neck was anesthetized with 1% lidocaine. Small incision was made in the right neck with a blade. Micropuncture set was placed in the right internal jugular vein with ultrasound guidance. The micropuncture wire was used for measurement purposes. The right chest was anesthetized with 1% lidocaine with epinephrine. #15 blade was used to make an incision and a subcutaneous port pocket was formed. Joiner was assembled. Subcutaneous tunnel was formed with a stiff tunneling device. The port catheter was brought through the subcutaneous tunnel. The port was placed in the subcutaneous pocket and sutured in place. The micropuncture set was exchanged for a peel-away sheath. The catheter was placed through the peel-away sheath and the tip was positioned at the superior cavoatrial junction.  Catheter placement was confirmed with fluoroscopy. The port was accessed and flushed with heparinized saline. The port pocket was closed using two layers of absorbable sutures and Dermabond. The vein skin site was closed using a single layer of absorbable suture and Dermabond. Sterile dressings were applied. Patient tolerated the procedure well without an immediate  complication. Ultrasound and fluoroscopic images were taken and saved for this procedure. IMPRESSION: Placement of a subcutaneous port device. Catheter tip at the superior cavoatrial junction. Electronically Signed   By: Markus Daft M.D.   On: 08/23/2018 16:37   Vas [place] Lower Extremity Venous (dvt)  Result Date: 08/14/2018  Lower Venous Study Indications: Swelling.  Limitations: Poor ultrasound/tissue interface. Performing Technologist: Oliver Hum, J  Examination Guidelines: A complete evaluation includes B-mode imaging, spectral Doppler, color Doppler, and power Doppler as needed of all accessible portions of each vessel. Bilateral testing is considered an integral part of a complete examination. Limited examinations for reoccurring indications may be performed as noted.  Right Venous Findings: +---------+---------------+---------+-----------+----------+-------+          CompressibilityPhasicitySpontaneityPropertiesSummary +---------+---------------+---------+-----------+----------+-------+ CFV      Full           Yes      Yes                          +---------+---------------+---------+-----------+----------+-------+ SFJ      Full                                                 +---------+---------------+---------+-----------+----------+-------+ FV Prox  Full                                                 +---------+---------------+---------+-----------+----------+-------+ FV Mid   Full                                                 +---------+---------------+---------+-----------+----------+-------+ FV  DistalFull                                                 +---------+---------------+---------+-----------+----------+-------+ PFV      Full                                                 +---------+---------------+---------+-----------+----------+-------+ POP      Full           Yes      Yes                          +---------+---------------+---------+-----------+----------+-------+ PTV      Full                                                 +---------+---------------+---------+-----------+----------+-------+ PERO     Full                                                 +---------+---------------+---------+-----------+----------+-------+  Left Venous Findings: +---+---------------+---------+-----------+----------+-------+    CompressibilityPhasicitySpontaneityPropertiesSummary +---+---------------+---------+-----------+----------+-------+ CFVFull           Yes      Yes                          +---+---------------+---------+-----------+----------+-------+    Summary: Right: There is no evidence of deep vein thrombosis in the lower extremity. No cystic structure found in the popliteal fossa. There is evidence of a large, mix echogenic area in the right groin measuring 4.9 cm high by greater than 4.6 cm wide by greater  than 5.8 cm long.  *See table(s) above for measurements and observations. Electronically signed by Deitra Mayo MD on 08/14/2018 at 5:43:43 AM.    Final     ASSESSMENT & PLAN:  78 y.o. female with  1. 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 Newly Diagnosed Recurrent/New High Grade B-Cell Non-Hodgkin's Lymphoma, Stage IV  FISH negative for findings of double hit lymphoma  07/18/18 CT A/P revealed Extensive abdominal-pelvic adenopathy, consistent with recurrent lymphoma. 2. Tiny hiatal hernia. 3. Pelvic floor laxity. Aortic Atherosclerosis.  Labs upon initial presentation from 08/06/18, WBC normal at 4.3k, HGB at 10.6, PLT normal at 201k  08/06/18 Right Inguinal LN Biopsy which revealed Large B-Cell Lymphoma with a Ki67 of 90%.  FISH is negative for rearrangements of BCL2, BCL6, and MYC. 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  08/13/18 Hep B and Hep C negative  08/21/18 ECHO which revealed LV EF of 50-55%  08/22/18 PET/CT revealed Progression, since 02/58/5277, of hypermetabolic abdominopelvic adenopathy, consistent with active lymphoma. 2. CT occult hypermetabolic osseous foci, also most consistent with active lymphoma. 3. No evidence of soft tissue disease above the diaphragm. 4. Focus of hypermetabolism within the anus is likely physiologic. Consider physical exam correlation. 5. Aortic Atherosclerosis.  3. RLE swelling r/o DVT. Could be from lymphedema related to her new/recurrent NHL  08/13/08 US Venous RLE to rule out DVT - reviewed - no VTE  4. Hypokalemia K 3.1 -- will monitor and replace as needed PLAN: -Discussed pt labwork today, 09/02/18; potassium lower at 3.1, other blood counts and chemistries are stable -The pt has no prohibitive toxicities from beginning C1D1 dose reduced EPOCH-R at this time in keeping with her age and prior CTx exposure. -Continue eating well, staying hydrated, and staying as active as reasonably possible  -Outpatient Neulasta and Rituxan on 09/09/18 -Will gauge indications for larger doses pending her display of tolerance  -Will evaluate pt with PET/CT after C2 or C3 for evaluation of treatment efficacy -Allopurinol 19m po daily for TLS  prophylaxis with C1 of treatment.  5.  Patient Active Problem List   Diagnosis Date Noted  . Encounter for antineoplastic chemotherapy   . Diffuse large B cell lymphoma ([place] 08/29/2018  . Counseling regarding advance care planning and goals of care 08/29/2018  . 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[place] 08/29/2018  . Hx of diverticulitis of colon 12/12/2011  . Leukopenia 12/12/2011  . Syncope 12/12/2011  . HH (hiatus hernia) 12/12/2011  . Other malignant lymphomas of  intra-abdominal lymph nodes 06/16/2011  . GERD 05/12/2008    All of the patients questions were answered with apparent satisfaction. The patient knows to call the clinic with any problems, questions or concerns.     Sullivan Lone MD MS AAHIVMS [facility] Hematology/Oncology Physician [facility]  (Office):       [phone] (Work cell):  [phone] (Fax):           [phone]  09/04/2018 10:10 PM  I, Baldwin [place], am acting as a scribe for Dr. Sullivan Lone.   .I have reviewed the above documentation for accuracy and completeness, and I agree with the above. Sullivan Lone MD MS

## 2018-09-04 NOTE — Progress Notes (Signed)
HEMATOLOGY/ONCOLOGY INPATIENT PROGRESS NOTE  Date of Service: 09/04/2018  Inpatient Attending: .Brunetta Genera, MD   SUBJECTIVE:   Taylor Huynh is accompanied today by two family members at bedside. The pt reports that she is doing well overall.   The pt reports that she had a small bowel movement yesterday, not today, and notes that she has been eating better. She notes that she is otherwise tolerating treatment well. The pt adds that her right leg swelling has continued to improve.  The pt notes that she has ambulated a little and did not feel fatigued, light headed or dizzy. She also denies nausea, vomiting, or diarrhea. She denies blood in the stools or urine, and denies any concerns for bleeding.   Lab results today (09/04/18) of CBC w/diff and CMP is as follows: all values are WNL except for RBC at 2.84, HGB at 8.1, HCT at 26.1, PLT at 129k, Glucose at 113, Glucose at 128, BUN at 26. 09/04/18 Uric acid is at 5.6  On review of systems, pt reports eating better, mild constipation, improved right leg swelling, stable energy levels, and denies nausea, vomiting, diarrhea, abdominal pains, light headedness, dizziness, blood in the stools, blood in the urine, and any other symptoms.    OBJECTIVE:  NAD  PHYSICAL EXAMINATION: . Vitals:   09/03/18 0514 09/03/18 1246 09/03/18 2113 09/04/18 0454  BP: 130/68 (!) 122/55 (!) 125/58 131/69  Pulse: 72 72 76 80  Resp: '17 18 16 15  ' Temp: 97.8 F (36.6 C) 98.1 F (36.7 C) 98 F (36.7 C) (!) 97.5 F (36.4 C)  TempSrc: Oral Oral Oral Oral  SpO2: 97% 99% 95% 95%  Weight:      Height:       Filed Weights   09/02/18 0910  Weight: 181 lb 1.8 oz (82.2 kg)   .Body mass index is 31.09 kg/m.  GENERAL:alert, in no acute distress and comfortable SKIN: no acute rashes, no significant lesions EYES: conjunctiva are pink and non-injected, sclera anicteric OROPHARYNX: MMM, no exudates, no oropharyngeal erythema or ulceration NECK:  supple, no JVD LYMPH:  no palpable lymphadenopathy in the cervical, axillary or inguinal regions LUNGS: clear to auscultation b/l with normal respiratory effort HEART: regular rate & rhythm ABDOMEN:  normoactive bowel sounds , non tender, not distended. No palpable hepatosplenomegaly.  Extremity: 1+ right pedal edema PSYCH: alert & oriented x 3 with fluent speech NEURO: no focal motor/sensory deficits   MEDICAL HISTORY:  Past Medical History:  Diagnosis Date  . GERD (gastroesophageal reflux disease)   . HH (hiatus hernia) 12/12/2011  . Hypertension   . Leukopenia 12/12/2011  . nhl dx'd 2010  . Seizures ([place]) reports "years ago"  . Syncope 12/12/2011   Hx seizures - not documented - age 68; recent syncope 6/12 and x2, 12/12; negative MRI brain 1/13    SURGICAL HISTORY: Past Surgical History:  Procedure Laterality Date  . COLON SURGERY    . IR IMAGING GUIDED PORT INSERTION  08/23/2018    SOCIAL HISTORY: Social History   Socioeconomic History  . Marital status: Married    Spouse name: Not on file  . Number of children: Not on file  . Years of education: Not on file  . Highest education level: Not on file  Occupational History  . Not on file  Social Needs  . Financial resource strain: Not on file  . Food insecurity:    Worry: Not on file    Inability: Not on file  .  Transportation needs:    Medical: Not on file    Non-medical: Not on file  Tobacco Use  . Smoking status: Never Smoker  . Smokeless tobacco: Never Used  Substance and Sexual Activity  . Alcohol use: No  . Drug use: No  . Sexual activity: Not on file  Lifestyle  . Physical activity:    Days per week: Not on file    Minutes per session: Not on file  . Stress: Not on file  Relationships  . Social connections:    Talks on phone: Not on file    Gets together: Not on file    Attends religious service: Not on file    Active member of club or organization: Not on file    Attends meetings of clubs or  organizations: Not on file    Relationship status: Not on file  . Intimate partner violence:    Fear of current or ex partner: Not on file    Emotionally abused: Not on file    Physically abused: Not on file    Forced sexual activity: Not on file  Other Topics Concern  . Not on file  Social History Narrative  . Not on file    FAMILY HISTORY: History reviewed. No pertinent family history.  ALLERGIES:  has No Known Allergies.  MEDICATIONS:  Scheduled Meds: . allopurinol  150 mg Oral Daily  . amLODipine  10 mg Oral Daily  . DOXOrubicin/vinCRIStine/etoposide CHEMO IV infusion for Inpatient CI   Intravenous Once  . DOXOrubicin/vinCRIStine/etoposide CHEMO IV infusion for Inpatient CI   Intravenous Once  . enoxaparin (LOVENOX) injection  40 mg Subcutaneous Q24H  . pantoprazole  40 mg Oral Daily  . predniSONE  60 mg Oral QAC breakfast  . vitamin B-12  1,000 mcg Oral QODAY   Continuous Infusions: . sodium chloride 20 mL/hr at 09/03/18 0300  . ondansetron (ZOFRAN) with dexamethasone (DECADRON) IV     PRN Meds:.acetaminophen, alum & mag hydroxide-simeth, Cold Pack, Hot Pack, lidocaine-prilocaine  REVIEW OF SYSTEMS:    A 10+ POINT REVIEW OF SYSTEMS WAS OBTAINED including neurology, dermatology, psychiatry, cardiac, respiratory, lymph, extremities, GI, GU, Musculoskeletal, constitutional, breasts, reproductive, HEENT.  All pertinent positives are noted in the HPI.  All others are negative.   LABORATORY DATA:  I have reviewed the data as listed  . CBC Latest Ref Rng & Units 09/04/2018 09/03/2018 09/02/2018  WBC 4.0 - 10.5 K/uL 5.8 3.4(L) 3.9(L)  Hemoglobin 12.0 - 15.0 g/dL 8.1(L) 8.7(L) 9.3(L)  Hematocrit 36.0 - 46.0 % 26.1(L) 27.9(L) 30.5(L)  Platelets 150 - 400 K/uL 129(L) 112(L) 130(L)    . CMP Latest Ref Rng & Units 09/04/2018 09/03/2018 09/02/2018  Glucose 70 - 99 mg/dL 128(H) 130(H) 106(H)  BUN 8 - 23 mg/dL 26(H) 23 23  Creatinine 0.44 - 1.00 mg/dL 0.99 0.90 1.04(H)  Sodium  135 - 145 mmol/L 144 139 138  Potassium 3.5 - 5.1 mmol/L 3.8 3.8 3.1(L)  Chloride 98 - 111 mmol/L 113(H) 108 107  CO2 22 - 32 mmol/L '22 23 24  ' Calcium 8.9 - 10.3 mg/dL 9.2 9.1 9.0  Total Protein 6.5 - 8.1 g/dL - - 6.0(L)  Total Bilirubin 0.3 - 1.2 mg/dL - - 0.8  Alkaline Phos 38 - 126 U/L - - 49  AST 15 - 41 U/L - - 14(L)  ALT 0 - 44 U/L - - 13     RADIOGRAPHIC STUDIES: I have personally reviewed the radiological images as listed and agreed with the findings  in the report. Nm Pet Image Initial (pi) Skull Base To Thigh  Result Date: 08/22/2018 CLINICAL DATA:  Subsequent treatment strategy for restaging of B-cell lymphoma. Initially diagnosed with lymphoma in 2011. EXAM: NUCLEAR MEDICINE PET SKULL BASE TO THIGH TECHNIQUE: 10.0 mCi F-18 FDG was injected intravenously. Full-ring PET imaging was performed from the skull base to thigh after the radiotracer. CT data was obtained and used for attenuation correction and anatomic localization. Fasting blood glucose: 84 mg/dl COMPARISON:  09/22/2009 PET.  07/18/2018 abdominopelvic CT FINDINGS: Mediastinal blood pool activity: SUV max 2.3 NECK: No areas of abnormal hypermetabolism. Incidental CT findings: Bilateral carotid atherosclerosis. No cervical adenopathy. CHEST: No pulmonary parenchymal or thoracic nodal hypermetabolism. Incidental CT findings: Mild cardiomegaly. Aortic atherosclerosis. Tiny hiatal hernia. Small bilateral thyroid nodules are nonspecific. No thoracic adenopathy. Inferior left-sided pleural fat proliferation versus small left-sided Bochdalek's hernia. ABDOMEN/PELVIS: Hypermetabolism corresponding to the previously described abdominopelvic adenopathy. Left periaortic adenopathy at up to 2.2 cm and a S.U.V. max of 35.3 on image 128/4. This node is increased from 1.3 cm on the prior. Right external iliac adenopathy measures 4.4 cm and a S.U.V. max of 39.3 on image 167/4. Compare 3.2 cm on the prior diagnostic CT. Right inguinal node measures  5.4 x 5.6 cm and a S.U.V. max of 43.7 on image 185/4. Enlarged from 4.4 x 4.0 cm on 07/18/2018. Focus of hypermetabolism within the anus is without CT correlate. Incidental CT findings: Abdominal aortic atherosclerosis. Moderate hepatic steatosis. Cholecystectomy. Left renal cyst. Pelvic floor laxity. SKELETON: Focus of eighth right lateral rib hypermetabolism is without CT correlate, including at a S.U.V. max of 8.8 on image 91/4. A left transverse process focus of hypermetabolism measures a S.U.V. max of 6.1 at approximately the T6 level. Incidental CT findings: none IMPRESSION: 1. Progression, since 75/64/3329, of hypermetabolic abdominopelvic adenopathy, consistent with active lymphoma. 2. CT occult hypermetabolic osseous foci, also most consistent with active lymphoma. 3. No evidence of soft tissue disease above the diaphragm. 4. Focus of hypermetabolism within the anus is likely physiologic. Consider physical exam correlation. 5.  Aortic Atherosclerosis (ICD10-I70.0). Electronically Signed   By: Abigail Miyamoto M.D.   On: 08/22/2018 11:56   [place] Core Biopsy (lymph Nodes)  Result Date: 08/06/2018 INDICATION: History of lymphoma. Adenopathy including right inguinal adenopathy. EXAM: ULTRASOUND GUIDED CORE BIOPSY OF RIGHT INGUINAL ADENOPATHY MEDICATIONS: Lidocaine 1% subcutaneous ANESTHESIA/SEDATION: Patient declined sedation PROCEDURE: The procedure, risks, benefits, and alternatives were explained to the patient. Questions regarding the procedure were encouraged and answered. The patient understands and consents to the procedure. Survey ultrasound of the right inguinal region performed. The adenopathy was localized and an appropriate skin entry site determined and marked. The operative field was prepped with chlorhexidine in a sterile fashion, and a sterile drape was applied covering the operative field. A sterile gown and sterile gloves were used for the procedure. Local anesthesia was provided with 1%  Lidocaine. Under real-time ultrasound guidance, a 17 gauge trocar needle was advanced to the margin of the lesion. Once needle tip position was confirmed, coaxial 18-gauge core biopsy samples were obtained, submitted in saline to surgical pathology. The guide needle was removed. Follow-up scan shows no hemorrhage or other apparent complication. The patient tolerated the procedure well. COMPLICATIONS: None immediate. FINDINGS: Right inguinal adenopathy was localized. Representative core biopsy samples obtained as above. IMPRESSION: 1. Technically successful ultrasound-guided core biopsy, right inguinal adenopathy. Electronically Signed   By: Lucrezia Europe M.D.   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 Port-A-Cath needed for treatment. EXAM: FLUOROSCOPIC AND ULTRASOUND GUIDED PLACEMENT OF A SUBCUTANEOUS PORT COMPARISON:  None. MEDICATIONS: Ancef 2 g; The antibiotic was administered within an appropriate time interval prior to skin puncture. ANESTHESIA/SEDATION: Versed 2.0 mg IV; Fentanyl 100 mcg IV; Moderate Sedation Time:  30 minutes The patient was continuously monitored during the procedure by the interventional radiology nurse under my direct supervision. FLUOROSCOPY TIME:  12 seconds, 3 mGy COMPLICATIONS: None immediate. PROCEDURE: The procedure, risks, benefits, and alternatives were explained to the patient. Questions regarding the procedure were encouraged and answered. The patient understands and consents to the procedure. Patient was placed supine on the interventional table. Ultrasound confirmed a patent right internal jugular vein. Ultrasound image was saved for documentation. The right chest and neck were cleaned with a skin antiseptic and a sterile drape was placed. Maximal barrier sterile technique was utilized including caps, mask, sterile gowns, sterile gloves, sterile drape, hand hygiene and skin  antiseptic. The right neck was anesthetized with 1% lidocaine. Small incision was made in the right neck with a blade. Micropuncture set was placed in the right internal jugular vein with ultrasound guidance. The micropuncture wire was used for measurement purposes. The right chest was anesthetized with 1% lidocaine with epinephrine. #15 blade was used to make an incision and a subcutaneous port pocket was formed. Brady was assembled. Subcutaneous tunnel was formed with a stiff tunneling device. The port catheter was brought through the subcutaneous tunnel. The port was placed in the subcutaneous pocket and sutured in place. The micropuncture set was exchanged for a peel-away sheath. The catheter was placed through the peel-away sheath and the tip was positioned at the superior cavoatrial junction. Catheter placement was confirmed with fluoroscopy. The port was accessed and flushed with heparinized saline. The port pocket was closed using two layers of absorbable sutures and Dermabond. The vein skin site was closed using a single layer of absorbable suture and Dermabond. Sterile dressings were applied. Patient tolerated the procedure well without an immediate complication. Ultrasound and fluoroscopic images were taken and saved for this procedure. IMPRESSION: Placement of a subcutaneous port device. Catheter tip at the superior cavoatrial junction. Electronically Signed   By: Markus Daft M.D.   On: 08/23/2018 16:37   Vas [place] Lower Extremity Venous (dvt)  Result Date: 08/14/2018  Lower Venous Study Indications: Swelling.  Limitations: Poor ultrasound/tissue interface. Performing Technologist: Oliver Hum, J  Examination Guidelines: A complete evaluation includes B-mode imaging, spectral Doppler, color Doppler, and power Doppler as needed of all accessible portions of each vessel. Bilateral testing is considered an integral part of a complete examination. Limited examinations for reoccurring  indications may be performed as noted.  Right Venous Findings: +---------+---------------+---------+-----------+----------+-------+          CompressibilityPhasicitySpontaneityPropertiesSummary +---------+---------------+---------+-----------+----------+-------+ CFV      Full           Yes      Yes                          +---------+---------------+---------+-----------+----------+-------+ SFJ      Full                                                 +---------+---------------+---------+-----------+----------+-------+ FV Prox  Full                                                 +---------+---------------+---------+-----------+----------+-------+  FV Mid   Full                                                 +---------+---------------+---------+-----------+----------+-------+ FV DistalFull                                                 +---------+---------------+---------+-----------+----------+-------+ PFV      Full                                                 +---------+---------------+---------+-----------+----------+-------+ POP      Full           Yes      Yes                          +---------+---------------+---------+-----------+----------+-------+ PTV      Full                                                 +---------+---------------+---------+-----------+----------+-------+ PERO     Full                                                 +---------+---------------+---------+-----------+----------+-------+  Left Venous Findings: +---+---------------+---------+-----------+----------+-------+    CompressibilityPhasicitySpontaneityPropertiesSummary +---+---------------+---------+-----------+----------+-------+ CFVFull           Yes      Yes                          +---+---------------+---------+-----------+----------+-------+    Summary: Right: There is no evidence of deep vein thrombosis in the lower extremity. No cystic  structure found in the popliteal fossa. There is evidence of a large, mix echogenic area in the right groin measuring 4.9 cm high by greater than 4.6 cm wide by greater  than 5.8 cm long.  *See table(s) above for measurements and observations. Electronically signed by Deitra Mayo MD on 08/14/2018 at 5:43:43 AM.    Final     ASSESSMENT & PLAN:  78 y.o. female with  1. 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 Newly Diagnosed Recurrent/New High Grade B-Cell Non-Hodgkin's Lymphoma, Stage IV FISH negative for findings of double hit lymphoma  07/18/18 CT A/P revealed Extensive abdominal-pelvic adenopathy, consistent with recurrent lymphoma. 2. Tiny hiatal hernia. 3. Pelvic floor laxity. Aortic Atherosclerosis.  Labs upon initial presentation from1/14/20, WBC normal at 4.3k, HGB at 10.6, PLT normal at 201k  08/06/18 Right Inguinal LN Biopsy which revealed Large  B-Cell Lymphoma with a Ki67 of 90%.  FISH is negative for rearrangements of BCL2, BCL6, and MYC. 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  08/13/18 Hep B and Hep C negative  08/21/18 ECHO which revealed LV EF of 50-55%  08/22/18 PET/CT revealedProgression, since 31/43/8887, of hypermetabolic abdominopelvic adenopathy, consistent with active lymphoma. 2. CT occult hypermetabolic osseous foci, also most consistent with active lymphoma. 3. No evidence of soft tissue disease above the diaphragm. 4. Focus of hypermetabolism within the anus is likely physiologic. Consider physical exam correlation. 5.  Aortic Atherosclerosis.  3. RLE swelling r/o DVT. Could be from lymphedema related to her new/recurrent NHL 1/21/10US Venous RLE to rule out DVT - reviewed - no VTE  4. Hypokalemia resolved K 3.8 today.  PLAN: -Discussed pt labwork today, 09/04/18; HGB lower at 8.1 blood counts and chemistries are stable -Will continue watching HGB, and will support with PRBC transfusion if necessary for hgb<8.. Pt denies light headedness, fatigue, and dizziness at this time.  -The pt has no prohibitive toxicities from continuing C1D3 dose reduced EPOCH-R at this time. Dose reduced due to age and previous chemotherapy exposure. -Continue eating well, staying hydrated, and staying as active as reasonably possible  -Miralax and Senna S -Allopurinol 154m po daily for TLS prophylaxis with C1 of treatment  -Outpatient Neulasta and Rituxan on 09/09/18 -Will gauge indications for larger doses pending her display of tolerance  -Will evaluate pt with PET/CT after C2 or C3 for evaluation of treatment efficacy.  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 No evidence of bleeding  6. DVT Px - lovenox [place]   The total time spent in the appt was 25 minutes and more than 50% was on counseling and direct patient cares.    GSullivan LoneMD MS AAHIVMS SKindred Hospital OntarioCSelect Specialty Hospital Columbus EastHematology/Oncology Physician CTilden Community Hospital (Office):       [phone](Work cell):  [phone](Fax):           [phone] 09/04/2018 12:52 PM   I, SBaldwin Jamaica am acting as a scribe for Dr. GSullivan Lone   .I have reviewed the above documentation for accuracy and completeness, and I agree with the above. GSullivan LoneMD MS

## 2018-09-05 LAB — IRON AND TIBC
Iron: 227 ug/dL — ABNORMAL HIGH (ref 28–170)
Saturation Ratios: 86 % — ABNORMAL HIGH (ref 10.4–31.8)
TIBC: 263 ug/dL (ref 250–450)
UIBC: 36 ug/dL

## 2018-09-05 LAB — CBC
HEMATOCRIT: 29.4 % — AB (ref 36.0–46.0)
HEMOGLOBIN: 9 g/dL — AB (ref 12.0–15.0)
MCH: 27.5 pg (ref 26.0–34.0)
MCHC: 30.6 g/dL (ref 30.0–36.0)
MCV: 89.9 fL (ref 80.0–100.0)
Platelets: 163 10*3/uL (ref 150–400)
RBC: 3.27 MIL/uL — ABNORMAL LOW (ref 3.87–5.11)
RDW: 14.8 % (ref 11.5–15.5)
WBC: 4.6 10*3/uL (ref 4.0–10.5)
nRBC: 0 % (ref 0.0–0.2)

## 2018-09-05 LAB — URIC ACID: Uric Acid, Serum: 5.2 mg/dL (ref 2.5–7.1)

## 2018-09-05 LAB — BASIC METABOLIC PANEL
Anion gap: 6 (ref 5–15)
BUN: 24 mg/dL — ABNORMAL HIGH (ref 8–23)
CO2: 23 mmol/L (ref 22–32)
Calcium: 9.3 mg/dL (ref 8.9–10.3)
Chloride: 114 mmol/L — ABNORMAL HIGH (ref 98–111)
Creatinine, Ser: 0.89 mg/dL (ref 0.44–1.00)
GFR calc Af Amer: >60 mL/min (ref >60)
Glucose, Bld: 119 mg/dL — ABNORMAL HIGH (ref 70–99)
Potassium: 3.6 mmol/L (ref 3.5–5.1)
SODIUM: 143 mmol/L (ref 135–145)

## 2018-09-05 LAB — SAMPLE TO BLOOD BANK

## 2018-09-05 LAB — FERRITIN: Ferritin: 586 ng/mL — ABNORMAL HIGH (ref 11–307)

## 2018-09-05 MED ORDER — SODIUM CHLORIDE 0.9 % IV SOLN
Freq: Once | INTRAVENOUS | Status: AC
  Administered 2018-09-05: 18 mg via INTRAVENOUS
  Filled 2018-09-05: qty 4

## 2018-09-05 MED ORDER — VINCRISTINE SULFATE CHEMO INJECTION 1 MG/ML
Freq: Once | INTRAVENOUS | Status: AC
  Administered 2018-09-05: 14:00:00 via INTRAVENOUS
  Filled 2018-09-05: qty 6

## 2018-09-05 NOTE — Progress Notes (Signed)
HEMATOLOGY/ONCOLOGY INPATIENT PROGRESS NOTE  Date of Service: 09/05/2018  Inpatient Attending: .Brunetta Genera, MD   SUBJECTIVE:   Taylor Huynh is accompanied today by her husband and granddaughters at bedside. The pt reports that she is doing well overall.   The pt reports that she has enjoyed improved energy levels. She notes that she has also begun moving her bowels and is eating well. She notes that she has ambulated periodically. She denies any nausea, vomiting, or diarrhea. She notes that her right leg swelling has continued to slowly improve, and notes that her right inguinal swelling has decreased as well.  Lab results today (09/05/18) of CBC w/diff and CMP is as follows: all values are WNL except for RBC at 3.27, HGB at 9.0, HCT at 29.4, Chloride at 114, Glucose at 119, BUN at 24. 09/05/18 Uric acid at 5.2 09/05/18 Ferritin at 586  On review of systems, pt reports improved energy levels, moving her bowels well, eating well, and denies abdominal pains, headaches, leg swelling, vomiting diarrhea, nausea, abdominal pains, and any other symptoms.    OBJECTIVE:  NAD  PHYSICAL EXAMINATION: . Vitals:   09/04/18 1356 09/04/18 2140 09/05/18 0526 09/05/18 1439  BP: (!) 130/58 (!) 145/64 (!) 143/74 137/67  Pulse: (!) 59 (!) 58 67 72  Resp: _0 Temp: 97.9 F (36.6 C) (!) 97.5 F (36.4 C) 97.7 F (36.5 C) 98 F (36.7 C)  TempSrc: Oral Oral Oral Oral  SpO2: 98% 97% 96% 99%  Weight:      Height:       Filed Weights   09/02/18 0910  Weight: 181 lb 1.8 oz (82.2 kg)   .Body mass index is 31.09 kg/m.  GENERAL:alert, in no acute distress and comfortable SKIN: no acute rashes, no significant lesions EYES: conjunctiva are pink and non-injected, sclera anicteric OROPHARYNX: MMM, no exudates, no oropharyngeal erythema or ulceration NECK: supple, no JVD LYMPH:  no palpable lymphadenopathy in the cervical, axillary or inguinal regions LUNGS: clear to  auscultation b/l with normal respiratory effort HEART: regular rate & rhythm ABDOMEN:  normoactive bowel sounds , non tender, not distended. No palpable hepatosplenomegaly.  Extremity: 1+ right pedal edema PSYCH: alert & oriented x 3 with fluent speech NEURO: no focal motor/sensory deficits   MEDICAL HISTORY:  Past Medical History:  Diagnosis Date  . GERD (gastroesophageal reflux disease)   . HH (hiatus hernia) 12/12/2011  . Hypertension   . Leukopenia 12/12/2011  . nhl dx'd 2010  . Seizures ([place]) reports "years ago"  . Syncope 12/12/2011   Hx seizures - not documented - age 58; recent syncope 6/12 and x2, 12/12; negative MRI brain 1/13    SURGICAL HISTORY: Past Surgical History:  Procedure Laterality Date  . COLON SURGERY    . IR IMAGING GUIDED PORT INSERTION  08/23/2018    SOCIAL HISTORY: Social History   Socioeconomic History  . Marital status: Married    Spouse name: Not on file  . Number of children: Not on file  . Years of education: Not on file  . Highest education level: Not on file  Occupational History  . Not on file  Social Needs  . Financial resource strain: Not on file  . Food insecurity:    Worry: Not on file    Inability: Not on file  . Transportation needs:    Medical: Not on file    Non-medical: Not on file  Tobacco Use  . Smoking status: Never Smoker  .  Smokeless tobacco: Never Used  Substance and Sexual Activity  . Alcohol use: No  . Drug use: No  . Sexual activity: Not on file  Lifestyle  . Physical activity:    Days per week: Not on file    Minutes per session: Not on file  . Stress: Not on file  Relationships  . Social connections:    Talks on phone: Not on file    Gets together: Not on file    Attends religious service: Not on file    Active member of club or organization: Not on file    Attends meetings of clubs or organizations: Not on file    Relationship status: Not on file  . Intimate partner violence:    Fear of current or ex  partner: Not on file    Emotionally abused: Not on file    Physically abused: Not on file    Forced sexual activity: Not on file  Other Topics Concern  . Not on file  Social History Narrative  . Not on file    FAMILY HISTORY: History reviewed. No pertinent family history.  ALLERGIES:  has No Known Allergies.  MEDICATIONS:  Scheduled Meds: . allopurinol  150 mg Oral Daily  . amLODipine  10 mg Oral Daily  . DOXOrubicin/vinCRIStine/etoposide CHEMO IV infusion for Inpatient CI   Intravenous Once  . enoxaparin (LOVENOX) injection  40 mg Subcutaneous Q24H  . pantoprazole  40 mg Oral Daily  . polyethylene glycol  17 g Oral Daily  . predniSONE  60 mg Oral QAC breakfast  . senna-docusate  2 tablet Oral BID  . vitamin B-12  1,000 mcg Oral QODAY   Continuous Infusions: . sodium chloride 20 mL/hr at 09/03/18 0300   PRN Meds:.acetaminophen, alum & mag hydroxide-simeth, Cold Pack, Hot Pack, lidocaine-prilocaine  REVIEW OF SYSTEMS:    A 10+ POINT REVIEW OF SYSTEMS WAS OBTAINED including neurology, dermatology, psychiatry, cardiac, respiratory, lymph, extremities, GI, GU, Musculoskeletal, constitutional, breasts, reproductive, HEENT.  All pertinent positives are noted in the HPI.  All others are negative.    LABORATORY DATA:  I have reviewed the data as listed  . CBC Latest Ref Rng & Units 09/05/2018 09/04/2018 09/03/2018  WBC 4.0 - 10.5 K/uL 4.6 5.8 3.4(L)  Hemoglobin 12.0 - 15.0 g/dL 9.0(L) 8.1(L) 8.7(L)  Hematocrit 36.0 - 46.0 % 29.4(L) 26.1(L) 27.9(L)  Platelets 150 - 400 K/uL 163 129(L) 112(L)    . CMP Latest Ref Rng & Units 09/05/2018 09/04/2018 09/03/2018  Glucose 70 - 99 mg/dL 119(H) 128(H) 130(H)  BUN 8 - 23 mg/dL 24(H) 26(H) 23  Creatinine 0.44 - 1.00 mg/dL 0.89 0.99 0.90  Sodium 135 - 145 mmol/L 143 144 139  Potassium 3.5 - 5.1 mmol/L 3.6 3.8 3.8  Chloride 98 - 111 mmol/L 114(H) 113(H) 108  CO2 22 - 32 mmol/L _0 Calcium 8.9 - 10.3 mg/dL 9.3 9.2 9.1  Total Protein  6.5 - 8.1 g/dL - - -  Total Bilirubin 0.3 - 1.2 mg/dL - - -  Alkaline Phos 38 - 126 U/L - - -  AST 15 - 41 U/L - - -  ALT 0 - 44 U/L - - -     RADIOGRAPHIC STUDIES: I have personally reviewed the radiological images as listed and agreed with the findings in the report. Nm Pet Image Initial (pi) Skull Base To Thigh  Result Date: 08/22/2018 CLINICAL DATA:  Subsequent treatment strategy for restaging of B-cell lymphoma. Initially diagnosed with lymphoma in  2011. EXAM: NUCLEAR MEDICINE PET SKULL BASE TO THIGH TECHNIQUE: 10.0 mCi F-18 FDG was injected intravenously. Full-ring PET imaging was performed from the skull base to thigh after the radiotracer. CT data was obtained and used for attenuation correction and anatomic localization. Fasting blood glucose: 84 mg/dl COMPARISON:  09/22/2009 PET.  07/18/2018 abdominopelvic CT FINDINGS: Mediastinal blood pool activity: SUV max 2.3 NECK: No areas of abnormal hypermetabolism. Incidental CT findings: Bilateral carotid atherosclerosis. No cervical adenopathy. CHEST: No pulmonary parenchymal or thoracic nodal hypermetabolism. Incidental CT findings: Mild cardiomegaly. Aortic atherosclerosis. Tiny hiatal hernia. Small bilateral thyroid nodules are nonspecific. No thoracic adenopathy. Inferior left-sided pleural fat proliferation versus small left-sided Bochdalek's hernia. ABDOMEN/PELVIS: Hypermetabolism corresponding to the previously described abdominopelvic adenopathy. Left periaortic adenopathy at up to 2.2 cm and a S.U.V. max of 35.3 on image 128/4. This node is increased from 1.3 cm on the prior. Right external iliac adenopathy measures 4.4 cm and a S.U.V. max of 39.3 on image 167/4. Compare 3.2 cm on the prior diagnostic CT. Right inguinal node measures 5.4 x 5.6 cm and a S.U.V. max of 43.7 on image 185/4. Enlarged from 4.4 x 4.0 cm on 07/18/2018. Focus of hypermetabolism within the anus is without CT correlate. Incidental CT findings: Abdominal aortic  atherosclerosis. Moderate hepatic steatosis. Cholecystectomy. Left renal cyst. Pelvic floor laxity. SKELETON: Focus of eighth right lateral rib hypermetabolism is without CT correlate, including at a S.U.V. max of 8.8 on image 91/4. A left transverse process focus of hypermetabolism measures a S.U.V. max of 6.1 at approximately the T6 level. Incidental CT findings: none IMPRESSION: 1. Progression, since 74/16/3845, of hypermetabolic abdominopelvic adenopathy, consistent with active lymphoma. 2. CT occult hypermetabolic osseous foci, also most consistent with active lymphoma. 3. No evidence of soft tissue disease above the diaphragm. 4. Focus of hypermetabolism within the anus is likely physiologic. Consider physical exam correlation. 5.  Aortic Atherosclerosis (ICD10-I70.0). Electronically Signed   By: Abigail Miyamoto M.D.   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 Port-A-Cath needed for treatment. EXAM: FLUOROSCOPIC AND ULTRASOUND GUIDED PLACEMENT OF A SUBCUTANEOUS PORT COMPARISON:  None. MEDICATIONS: Ancef 2 g; The antibiotic was administered within an appropriate time interval prior to skin puncture. ANESTHESIA/SEDATION: Versed 2.0 mg IV; Fentanyl 100 mcg IV; Moderate Sedation Time:  30 minutes The patient was continuously monitored during the procedure by the interventional radiology nurse under my direct supervision. FLUOROSCOPY TIME:  12 seconds, 3 mGy COMPLICATIONS: None immediate. PROCEDURE: The procedure, risks, benefits, and alternatives were explained to the patient. Questions regarding the procedure were encouraged and answered. The patient understands and consents to the procedure. Patient was placed supine on the interventional table. Ultrasound confirmed a patent right internal jugular vein. Ultrasound image was saved for documentation. The right chest and neck were cleaned with a skin antiseptic and a  sterile drape was placed. Maximal barrier sterile technique was utilized including caps, mask, sterile gowns, sterile gloves, sterile drape, hand hygiene and skin antiseptic. The right neck was anesthetized with 1% lidocaine. Small incision was made in the right neck with a blade. Micropuncture set was placed in the right internal jugular vein with ultrasound guidance. The micropuncture wire was used for measurement purposes. The right chest was anesthetized with 1% lidocaine with epinephrine. #15 blade was used to make an incision and a subcutaneous port pocket was formed. [place] was assembled. Subcutaneous tunnel was formed with a stiff  tunneling device. The port catheter was brought through the subcutaneous tunnel. The port was placed in the subcutaneous pocket and sutured in place. The micropuncture set was exchanged for a peel-away sheath. The catheter was placed through the peel-away sheath and the tip was positioned at the superior cavoatrial junction. Catheter placement was confirmed with fluoroscopy. The port was accessed and flushed with heparinized saline. The port pocket was closed using two layers of absorbable sutures and Dermabond. The vein skin site was closed using a single layer of absorbable suture and Dermabond. Sterile dressings were applied. Patient tolerated the procedure well without an immediate complication. Ultrasound and fluoroscopic images were taken and saved for this procedure. IMPRESSION: Placement of a subcutaneous port device. Catheter tip at the superior cavoatrial junction. Electronically Signed   By: Markus Daft M.D.   On: 08/23/2018 16:37   Vas [place] Lower Extremity Venous (dvt)  Result Date: 08/14/2018  Lower Venous Study Indications: Swelling.  Limitations: Poor ultrasound/tissue interface. Performing Technologist: Oliver Hum, J  Examination Guidelines: A complete evaluation includes B-mode imaging, spectral Doppler, color Doppler, and power Doppler as  needed of all accessible portions of each vessel. Bilateral testing is considered an integral part of a complete examination. Limited examinations for reoccurring indications may be performed as noted.  Right Venous Findings: +---------+---------------+---------+-----------+----------+-------+          CompressibilityPhasicitySpontaneityPropertiesSummary +---------+---------------+---------+-----------+----------+-------+ CFV      Full           Yes      Yes                          +---------+---------------+---------+-----------+----------+-------+ SFJ      Full                                                 +---------+---------------+---------+-----------+----------+-------+ FV Prox  Full                                                 +---------+---------------+---------+-----------+----------+-------+ FV Mid   Full                                                 +---------+---------------+---------+-----------+----------+-------+ FV DistalFull                                                 +---------+---------------+---------+-----------+----------+-------+ PFV      Full                                                 +---------+---------------+---------+-----------+----------+-------+ POP      Full           Yes      Yes                          +---------+---------------+---------+-----------+----------+-------+  PTV      Full                                                 +---------+---------------+---------+-----------+----------+-------+ PERO     Full                                                 +---------+---------------+---------+-----------+----------+-------+  Left Venous Findings: +---+---------------+---------+-----------+----------+-------+    CompressibilityPhasicitySpontaneityPropertiesSummary +---+---------------+---------+-----------+----------+-------+ CFVFull           Yes      Yes                           +---+---------------+---------+-----------+----------+-------+    Summary: Right: There is no evidence of deep vein thrombosis in the lower extremity. No cystic structure found in the popliteal fossa. There is evidence of a large, mix echogenic area in the right groin measuring 4.9 cm high by greater than 4.6 cm wide by greater  than 5.8 cm long.  *See table(s) above for measurements and observations. Electronically signed by Deitra Mayo MD on 08/14/2018 at 5:43:43 AM.    Final     ASSESSMENT & PLAN:  78 y.o. female with  1. 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 Newly Diagnosed Recurrent/New High Grade B-Cell Non-Hodgkin's Lymphoma, Stage IV FISH negative for findings of double hit lymphoma  07/18/18 CT A/P revealed Extensive abdominal-pelvic adenopathy, consistent with recurrent lymphoma. 2. Tiny hiatal hernia. 3. Pelvic floor laxity. Aortic Atherosclerosis.  Labs upon initial presentation from1/14/20, WBC normal at 4.3k, HGB at 10.6, PLT normal at 201k  08/06/18 Right Inguinal LN Biopsy which revealed Large B-Cell Lymphoma with a Ki67 of 90%.  FISH is negative for rearrangements of BCL2, BCL6, and MYC. 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  08/13/18 Hep B and Hep C negative  08/21/18 ECHO which revealed LV EF of 50-55%  08/22/18 PET/CT revealedProgression, since 59/56/3875, of hypermetabolic abdominopelvic adenopathy, consistent with active lymphoma. 2. CT occult hypermetabolic osseous foci, also most consistent with active lymphoma. 3. No  evidence of soft tissue disease above the diaphragm. 4. Focus of hypermetabolism within the anus is likely physiologic. Consider physical exam correlation. 5. Aortic Atherosclerosis.  3. RLE swelling r/o DVT. Could be from lymphedema related to her new/recurrent NHL 1/21/10US Venous RLE to rule out DVT - reviewed - no VTE  4. Hypokalemia resolved K 3.8 today.  PLAN: -Discussed pt labwork today, 09/05/18; HGB improved to 9.0, other counts and chemistries are stable. Uric acid normal at 5.2 -The pt has no prohibitive toxicities from continuing C1D4 dose reduced EPOCH-R at this time. Dose reduced for age and previous chemotherapy exposure -Continue eating well, staying hydrated, and staying as active as reasonably possible   -Will continue watching HGB, and will support with PRBC transfusion if necessary for hgb<8.. Pt denies light headedness,  fatigue, and dizziness at this time.  -Miralax and Senna S -Allopurinol 17m po daily for TLS prophylaxis with C1 of treatment  -Outpatient Neulasta and Rituxan on 09/09/18 -Will gauge indications for larger doses pending her display of tolerance  -Will evaluate pt with PET/CT after C2 or C3 for evaluation of treatment efficacy.  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 No evidence of bleeding  6. DVT Px - lovenox [place]   The total time spent in the appt was 25 minutes and more than 50% was on counseling and direct patient cares.    GSullivan LoneMD MS AAHIVMS SSouthwest Healthcare ServicesCNorthwest Surgery Center Red OakHematology/Oncology Physician CIdaho Eye Center Pa (Office):       [phone](Work cell):  [phone](Fax):           [phone] 09/05/2018 4:04 PM   I, SBaldwin Jamaica am acting as a scribe for Dr. GSullivan Lone   .I have reviewed the above documentation for accuracy and completeness, and I agree with the above. GSullivan LoneMD MS

## 2018-09-06 LAB — CBC
HCT: 25.9 % — ABNORMAL LOW (ref 36.0–46.0)
Hemoglobin: 8.2 g/dL — ABNORMAL LOW (ref 12.0–15.0)
MCH: 28.4 pg (ref 26.0–34.0)
MCHC: 31.7 g/dL (ref 30.0–36.0)
MCV: 89.6 fL (ref 80.0–100.0)
Platelets: 149 10*3/uL — ABNORMAL LOW (ref 150–400)
RBC: 2.89 MIL/uL — ABNORMAL LOW (ref 3.87–5.11)
RDW: 14.4 % (ref 11.5–15.5)
WBC: 2.4 10*3/uL — ABNORMAL LOW (ref 4.0–10.5)
nRBC: 0 % (ref 0.0–0.2)

## 2018-09-06 LAB — BASIC METABOLIC PANEL
Anion gap: 6 (ref 5–15)
BUN: 20 mg/dL (ref 8–23)
CHLORIDE: 110 mmol/L (ref 98–111)
CO2: 25 mmol/L (ref 22–32)
Calcium: 9.1 mg/dL (ref 8.9–10.3)
Creatinine, Ser: 0.84 mg/dL (ref 0.44–1.00)
GFR calc Af Amer: >60 mL/min (ref >60)
GFR calc non Af Amer: >60 mL/min (ref >60)
Glucose, Bld: 122 mg/dL — ABNORMAL HIGH (ref 70–99)
Potassium: 3.3 mmol/L — ABNORMAL LOW (ref 3.5–5.1)
Sodium: 141 mmol/L (ref 135–145)

## 2018-09-06 MED ORDER — HEPARIN SOD (PORK) LOCK FLUSH 100 UNIT/ML IV SOLN
500.0000 [IU] | Freq: Once | INTRAVENOUS | Status: AC
  Administered 2018-09-06: 500 [IU] via INTRAVENOUS
  Filled 2018-09-06: qty 5

## 2018-09-06 MED ORDER — POTASSIUM CHLORIDE CRYS ER 20 MEQ PO TBCR: 20.0000 meq | EXTENDED_RELEASE_TABLET | Freq: Two times a day (BID) | ORAL | 0 refills | Status: DC

## 2018-09-06 MED ORDER — SODIUM CHLORIDE 0.9 % IV SOLN
Freq: Once | INTRAVENOUS | Status: AC
  Administered 2018-09-06: 8 mg via INTRAVENOUS
  Filled 2018-09-06: qty 8

## 2018-09-06 MED ORDER — SENNOSIDES-DOCUSATE SODIUM 8.6-50 MG PO TABS: 2.0000 | ORAL_TABLET | Freq: Every evening | ORAL | 1 refills | Status: DC | PRN

## 2018-09-06 MED ORDER — POLYETHYLENE GLYCOL 3350 17 G PO PACK: 17.0000 g | PACK | Freq: Every day | ORAL | 0 refills | Status: DC

## 2018-09-06 MED ORDER — SODIUM CHLORIDE 0.9 % IV SOLN
400.0000 mg/m2 | Freq: Once | INTRAVENOUS | Status: AC
  Administered 2018-09-06: 780 mg via INTRAVENOUS
  Filled 2018-09-06: qty 39

## 2018-09-06 MED ORDER — ALLOPURINOL 100 MG PO TABS: 100.0000 mg | ORAL_TABLET | Freq: Every day | ORAL | 0 refills | Status: DC

## 2018-09-06 NOTE — Progress Notes (Signed)
Confirmed Cytoxan dose at 400mg /m2 per Dr. Irene Limbo.  Hardie Pulley, PharmD, BCPS, BCOP

## 2018-09-06 NOTE — Care Management Important Message (Signed)
Important Message  Patient Details  Name: Taylor Huynh MRN: [id number] Date of Birth: 20-Mar-1941   Medicare Important Message Given:  Yes    [name] 09/06/2018, 11:38 AMImportant Message  Patient Details  Name: Taylor Huynh MRN: [id number] Date of Birth: [birth year]   Medicare Important Message Given:  Yes    [name] 09/06/2018, 11:38 AM

## 2018-09-06 NOTE — Discharge Summary (Signed)
[place]  Telephone:([phone] Fax:(336) [phone]    Physician Discharge Summary     Patient ID: Taylor Huynh MRN: [id number] DOB/AGE: [birth year] 78 y.o.  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 Diffuse large B cell lymphoma [facility])   Discharge Medications:  Allergies as of 09/06/2018   No Known Allergies     Medication List    TAKE these medications   allopurinol 100 MG tablet Commonly known as:  ZYLOPRIM Take 1 tablet (100 mg total) by mouth daily. Start taking on:  September 07, 2018   amLODipine 10 MG tablet Commonly known as:  NORVASC Take 10 mg by mouth daily.   cholecalciferol 1000 units tablet Commonly known as:  VITAMIN D Take 1,000 Units by mouth daily.   furosemide 20 MG tablet Commonly known as:  LASIX Take 1 tablet (20 mg total) by mouth daily. Hold if you are dizzy.   lidocaine-prilocaine cream Commonly known as:  EMLA Apply 1 application topically as needed. What changed:  reasons to take this   Melatonin 5 MG Tabs Take 5 mg by mouth daily.   omeprazole 20 MG capsule Commonly known as:  PRILOSEC Take 20 mg by mouth daily.   polyethylene glycol packet Commonly known as:  MIRALAX / GLYCOLAX Take 17 g by mouth daily. Start taking on:  September 07, 2018   potassium chloride SA 20 MEQ tablet Commonly known as:  K-DUR,KLOR-CON Take 1 tablet (20 mEq total) by mouth 2 (two) times daily.   senna-docusate 8.6-50 MG tablet Commonly known as:  Senokot-S Take 2 tablets by mouth at bedtime as needed for mild constipation.   vitamin B-12 1000 MCG tablet Commonly known as:  CYANOCOBALAMIN Take 1,000 mcg by mouth every other day.        Disposition and Follow-up:  Outpatient Neulasta and Rituxan on 09/09/18   Significant Diagnostic  Studies:  Nm Pet Image Initial (pi) Skull Base To Thigh  Result Date: 08/22/2018 CLINICAL DATA:  Subsequent treatment strategy for restaging of B-cell lymphoma. Initially diagnosed with lymphoma in 2011. EXAM: NUCLEAR MEDICINE PET SKULL BASE TO THIGH TECHNIQUE: 10.0 mCi F-18 FDG was injected intravenously. Full-ring PET imaging was performed from the skull base to thigh after the radiotracer. CT data was obtained and used for attenuation correction and anatomic localization. Fasting blood glucose: 84 mg/dl COMPARISON:  09/22/2009 PET.  07/18/2018 abdominopelvic CT FINDINGS: Mediastinal blood pool activity: SUV max 2.3 NECK: No areas of abnormal hypermetabolism. Incidental CT findings: Bilateral carotid atherosclerosis. No cervical adenopathy. CHEST: No pulmonary parenchymal or thoracic nodal hypermetabolism. Incidental CT findings: Mild cardiomegaly. Aortic atherosclerosis. Tiny hiatal hernia. Small bilateral thyroid nodules are nonspecific. No thoracic adenopathy. Inferior left-sided pleural fat proliferation versus small left-sided Bochdalek's hernia. ABDOMEN/PELVIS: Hypermetabolism corresponding to the previously described abdominopelvic adenopathy. Left periaortic adenopathy at up to 2.2 cm and a S.U.V. max of 35.3 on image 128/4. This node is increased from 1.3 cm on the prior. Right external iliac adenopathy measures 4.4 cm and a S.U.V. max of 39.3 on image 167/4. Compare 3.2 cm on the prior diagnostic CT. Right inguinal node measures 5.4 x 5.6 cm and a S.U.V. max of 43.7 on image 185/4. Enlarged from 4.4 x 4.0 cm on 07/18/2018. Focus of hypermetabolism within the anus is without CT correlate. Incidental CT findings: Abdominal aortic  atherosclerosis. Moderate hepatic steatosis. Cholecystectomy. Left renal cyst. Pelvic floor laxity. SKELETON: Focus of eighth right lateral rib hypermetabolism is without CT correlate, including at a S.U.V. max of 8.8 on image 91/4. A left transverse process focus of  hypermetabolism measures a S.U.V. max of 6.1 at approximately the T6 level. Incidental CT findings: none IMPRESSION: 1. Progression, since 16/04/9603, of hypermetabolic abdominopelvic adenopathy, consistent with active lymphoma. 2. CT occult hypermetabolic osseous foci, also most consistent with active lymphoma. 3. No evidence of soft tissue disease above the diaphragm. 4. Focus of hypermetabolism within the anus is likely physiologic. Consider physical exam correlation. 5.  Aortic Atherosclerosis (ICD10-I70.0). Electronically Signed   By: Abigail Miyamoto M.D.   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 Port-A-Cath needed for treatment. EXAM: FLUOROSCOPIC AND ULTRASOUND GUIDED PLACEMENT OF A SUBCUTANEOUS PORT COMPARISON:  None. MEDICATIONS: Ancef 2 g; The antibiotic was administered within an appropriate time interval prior to skin puncture. ANESTHESIA/SEDATION: Versed 2.0 mg IV; Fentanyl 100 mcg IV; Moderate Sedation Time:  30 minutes The patient was continuously monitored during the procedure by the interventional radiology nurse under my direct supervision. FLUOROSCOPY TIME:  12 seconds, 3 mGy COMPLICATIONS: None immediate. PROCEDURE: The procedure, risks, benefits, and alternatives were explained to the patient. Questions regarding the procedure were encouraged and answered. The patient understands and consents to the procedure. Patient was placed supine on the interventional table. Ultrasound confirmed a patent right internal jugular vein. Ultrasound image was saved for documentation. The right chest and neck were cleaned with a skin antiseptic and a sterile drape was placed. Maximal barrier sterile technique was utilized including caps, mask, sterile gowns, sterile gloves, sterile drape, hand hygiene and skin antiseptic. The right neck was anesthetized with 1% lidocaine. Small incision was made in the right neck  with a blade. Micropuncture set was placed in the right internal jugular vein with ultrasound guidance. The micropuncture wire was used for measurement purposes. The right chest was anesthetized with 1% lidocaine with epinephrine. #15 blade was used to make an incision and a subcutaneous port pocket was formed. [place] was assembled. Subcutaneous tunnel was formed with a stiff tunneling device. The port catheter was brought through the subcutaneous tunnel. The port was placed in the subcutaneous pocket and sutured in place. The micropuncture set was exchanged for a peel-away sheath. The catheter was placed through the peel-away sheath and the tip was positioned at the superior cavoatrial junction. Catheter placement was confirmed with fluoroscopy. The port was accessed and flushed with heparinized saline. The port pocket was closed using two layers of absorbable sutures and Dermabond. The vein skin site was closed using a single layer of absorbable suture and Dermabond. Sterile dressings were applied. Patient tolerated the procedure well without an immediate complication. Ultrasound and fluoroscopic images were taken and saved for this procedure. IMPRESSION: Placement of a subcutaneous port device. Catheter tip at the superior cavoatrial junction. Electronically Signed   By: Markus Daft M.D.   On: 08/23/2018 16:37   Vas [place] Lower Extremity Venous (dvt)  Result Date: 08/14/2018  Lower Venous Study Indications: Swelling.  Limitations: Poor ultrasound/tissue interface. Performing Technologist: Oliver Hum, J  Examination Guidelines: A complete evaluation includes B-mode imaging, spectral Doppler, color Doppler, and power Doppler as needed of all accessible portions of each vessel. Bilateral testing is considered an integral part of a complete examination. Limited examinations for reoccurring indications may be  performed as noted.  Right Venous Findings:  +---------+---------------+---------+-----------+----------+-------+          CompressibilityPhasicitySpontaneityPropertiesSummary +---------+---------------+---------+-----------+----------+-------+ CFV      Full           Yes      Yes                          +---------+---------------+---------+-----------+----------+-------+ SFJ      Full                                                 +---------+---------------+---------+-----------+----------+-------+ FV Prox  Full                                                 +---------+---------------+---------+-----------+----------+-------+ FV Mid   Full                                                 +---------+---------------+---------+-----------+----------+-------+ FV DistalFull                                                 +---------+---------------+---------+-----------+----------+-------+ PFV      Full                                                 +---------+---------------+---------+-----------+----------+-------+ POP      Full           Yes      Yes                          +---------+---------------+---------+-----------+----------+-------+ PTV      Full                                                 +---------+---------------+---------+-----------+----------+-------+ PERO     Full                                                 +---------+---------------+---------+-----------+----------+-------+  Left Venous Findings: +---+---------------+---------+-----------+----------+-------+    CompressibilityPhasicitySpontaneityPropertiesSummary +---+---------------+---------+-----------+----------+-------+ CFVFull           Yes      Yes                          +---+---------------+---------+-----------+----------+-------+    Summary: Right: There is no evidence of deep vein thrombosis in the lower extremity. No cystic structure found in the popliteal fossa. There is evidence of a  large, mix echogenic area in the right groin measuring 4.9 cm high by greater  than 4.6 cm wide by greater  than 5.8 cm long.  *See table(s) above for measurements and observations. Electronically signed by Deitra Mayo MD on 08/14/2018 at 5:43:43 AM.    Final     Discharge Laboratory Values: . CBC Latest Ref Rng & Units 09/06/2018 09/05/2018 09/04/2018  WBC 4.0 - 10.5 K/uL 2.4(L) 4.6 5.8  Hemoglobin 12.0 - 15.0 g/dL 8.2(L) 9.0(L) 8.1(L)  Hematocrit 36.0 - 46.0 % 25.9(L) 29.4(L) 26.1(L)  Platelets 150 - 400 K/uL 149(L) 163 129(L)   . CMP Latest Ref Rng & Units 09/06/2018 09/05/2018 09/04/2018  Glucose 70 - 99 mg/dL 122(H) 119(H) 128(H)  BUN 8 - 23 mg/dL 20 24(H) 26(H)  Creatinine 0.44 - 1.00 mg/dL 0.84 0.89 0.99  Sodium 135 - 145 mmol/L 141 143 144  Potassium 3.5 - 5.1 mmol/L 3.3(L) 3.6 3.8  Chloride 98 - 111 mmol/L 110 114(H) 113(H)  CO2 22 - 32 mmol/L '25 23 22  ' Calcium 8.9 - 10.3 mg/dL 9.1 9.3 9.2  Total Protein 6.5 - 8.1 g/dL - - -  Total Bilirubin 0.3 - 1.2 mg/dL - - -  Alkaline Phos 38 - 126 U/L - - -  AST 15 - 41 U/L - - -  ALT 0 - 44 U/L - - -    Brief H and P: For complete details please refer to admission H and P, but in brief, Taylor Huynh is a wonderful 78 y.o. female who has been admitted today for C1 dose reduced EPOCH-R treatment for her High Grade B-Cell Non-Hodgkin's Lymphoma. She is accompanied today by her daughter and granddaughter at bedside. The pt reports that she is doing well overall.   The pt reports that she has had a weaker appetite in the interim. She notes that her right leg continues to be swollen, but has improved slightly. She denies concerns for infections and denies abdominal pains. The pt notes that she is eating about 80% as much as she had been.  Issues during hospitalization  1. 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 Newly Diagnosed Recurrent/New High Grade B-Cell Non-Hodgkin's Lymphoma, Stage IV FISH negative for findings of double hit lymphoma  07/18/18 CT A/P revealed Extensive abdominal-pelvic adenopathy, consistent with recurrent lymphoma. 2. Tiny hiatal hernia. 3. Pelvic floor laxity. Aortic Atherosclerosis.  Labs upon initial presentation from1/14/20, WBC normal at 4.3k, HGB at 10.6, PLT normal at 201k  08/06/18 Right Inguinal LN Biopsy which revealed Large B-Cell Lymphoma with a Ki67 of 90%. FISH is negative for rearrangements of BCL2, BCL6, and MYC. 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  08/13/18 Hep B and Hep C negative  08/21/18 ECHO which revealed LV EF of 50-55%  08/22/18 PET/CT revealedProgression, since 48/25/0037, of hypermetabolic abdominopelvic adenopathy, consistent with active lymphoma. 2. CT occult hypermetabolic osseous foci, also most consistent with active lymphoma. 3. No evidence of soft tissue disease above the diaphragm. 4. Focus of hypermetabolism within the anus is likely physiologic. Consider physical exam correlation. 5. Aortic Atherosclerosis.  3. RLE swelling r/o DVT. Could be from lymphedema related to her new/recurrent NHL 1/21/10US Venous RLE to rule out DVT - reviewed -  no VTE  4. Hypokalemia resolved K 3.8 today.  5. Pancytopenia due to chemotherapy and DLBCL PLAN: -Discussed pt labwork today, 09/06/18; HGB improved to 8.2, other counts and chemistries are stable. Uric acid normal at 5.2 -The pt has no prohibitive toxicities from continuing C1D4 dose reduced EPOCH-R at this time. Dose reduced for age and previous chemotherapy  exposure -Continue eating well, staying hydrated, and staying as active as reasonably possible   -Will continue watching HGB, and will support with PRBC transfusion if necessary for hgb<8.. Pt denies light headedness, fatigue, and dizziness at this time.  -Miralax and Senna S -Allopurinol 134m po daily for TLS prophylaxis with C1 of treatment  -Outpatient Neulasta and Rituxan on 09/09/18 -Will gauge indications for larger doses pending her display of tolerance  -Will evaluate pt with PET/CT after C2 or C3 for evaluation of treatment efficacy.  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 No evidence of bleeding  6. DVT Px - lovenox [place]  Physical Exam at Discharge: BP (!) 154/74 (BP Location: Left Arm)   Pulse (!) 57   Temp 97.7 F (36.5 C) (Oral)   Resp 16   Ht '5\' 4"'  (1.626 m)   Wt 181 lb 1.8 oz (82.2 kg)   LMP  (LMP Unknown)   SpO2 98%   BMI 31.09 kg/m  . GENERAL:alert, in no acute distress and comfortable SKIN: no acute rashes, no significant lesions EYES: conjunctiva are pink and non-injected, sclera anicteric OROPHARYNX: MMM, no exudates, no oropharyngeal erythema or ulceration NECK: supple, no JVD LYMPH:  no palpable lymphadenopathy in the cervical, axillary or inguinal regions LUNGS: clear to auscultation b/l with normal respiratory effort HEART: regular rate & rhythm ABDOMEN:  normoactive bowel sounds , non tender, not distended. Extremity: no pedal edema PSYCH: alert & oriented x 3 with fluent speech NEURO: no focal motor/sensory deficits   Hospital Course:  Active Problems:   Diffuse large B cell lymphoma (HCC)   Encounter for antineoplastic chemotherapy   Diet:  Low salt diet with high protein supplements  Activity:  Infection precautions as directed  Condition at Discharge:   Stable  Signed: Dr. GSullivan LoneMD [phone] 09/06/2018, 11:24 AM   TT spent discharging patient>382ms

## 2018-09-06 NOTE — Progress Notes (Signed)
This RN assumed care of pt as of 2300, agree with charted assessment. Continue to monitor. Hortencia Conradi RN

## 2018-09-07 ENCOUNTER — Other Ambulatory Visit: Payer: Self-pay

## 2018-09-09 ENCOUNTER — Encounter: Payer: Self-pay | Admitting: Hematology

## 2018-09-09 ENCOUNTER — Inpatient Hospital Stay: Payer: Medicare Other

## 2018-09-09 ENCOUNTER — Other Ambulatory Visit: Payer: Self-pay | Admitting: *Deleted

## 2018-09-09 VITALS — BP 141/65 | HR 82 | Temp 98.1°F | Resp 18

## 2018-09-09 DIAGNOSIS — Z79899 Other long term (current) drug therapy: Secondary | ICD-10-CM | POA: Diagnosis not present

## 2018-09-09 DIAGNOSIS — I10 Essential (primary) hypertension: Secondary | ICD-10-CM | POA: Diagnosis not present

## 2018-09-09 DIAGNOSIS — C8338 Diffuse large B-cell lymphoma, lymph nodes of multiple sites: Secondary | ICD-10-CM

## 2018-09-09 DIAGNOSIS — E538 Deficiency of other specified B group vitamins: Secondary | ICD-10-CM | POA: Diagnosis not present

## 2018-09-09 DIAGNOSIS — K760 Fatty (change of) liver, not elsewhere classified: Secondary | ICD-10-CM | POA: Diagnosis not present

## 2018-09-09 DIAGNOSIS — I6523 Occlusion and stenosis of bilateral carotid arteries: Secondary | ICD-10-CM | POA: Diagnosis not present

## 2018-09-09 DIAGNOSIS — R569 Unspecified convulsions: Secondary | ICD-10-CM | POA: Diagnosis not present

## 2018-09-09 DIAGNOSIS — R599 Enlarged lymph nodes, unspecified: Secondary | ICD-10-CM | POA: Diagnosis not present

## 2018-09-09 DIAGNOSIS — K219 Gastro-esophageal reflux disease without esophagitis: Secondary | ICD-10-CM | POA: Diagnosis not present

## 2018-09-09 DIAGNOSIS — Z7189 Other specified counseling: Secondary | ICD-10-CM

## 2018-09-09 DIAGNOSIS — Z5112 Encounter for antineoplastic immunotherapy: Secondary | ICD-10-CM | POA: Diagnosis not present

## 2018-09-09 DIAGNOSIS — Z7689 Persons encountering health services in other specified circumstances: Secondary | ICD-10-CM | POA: Diagnosis not present

## 2018-09-09 DIAGNOSIS — I700 Atherosclerosis of aorta: Secondary | ICD-10-CM | POA: Diagnosis not present

## 2018-09-09 DIAGNOSIS — R590 Localized enlarged lymph nodes: Secondary | ICD-10-CM | POA: Diagnosis not present

## 2018-09-09 DIAGNOSIS — K449 Diaphragmatic hernia without obstruction or gangrene: Secondary | ICD-10-CM | POA: Diagnosis not present

## 2018-09-09 DIAGNOSIS — Z8669 Personal history of other diseases of the nervous system and sense organs: Secondary | ICD-10-CM | POA: Diagnosis not present

## 2018-09-09 DIAGNOSIS — E042 Nontoxic multinodular goiter: Secondary | ICD-10-CM | POA: Diagnosis not present

## 2018-09-09 DIAGNOSIS — N281 Cyst of kidney, acquired: Secondary | ICD-10-CM | POA: Diagnosis not present

## 2018-09-09 LAB — CMP (CANCER CENTER ONLY)
ALT: 15 U/L (ref 0–44)
AST: 11 U/L — ABNORMAL LOW (ref 15–41)
Albumin: 3.1 g/dL — ABNORMAL LOW (ref 3.5–5.0)
Alkaline Phosphatase: 58 U/L (ref 38–126)
Anion gap: 11 (ref 5–15)
BUN: 19 mg/dL (ref 8–23)
CO2: 27 mmol/L (ref 22–32)
Calcium: 9.2 mg/dL (ref 8.9–10.3)
Chloride: 103 mmol/L (ref 98–111)
Creatinine: 1.05 mg/dL — ABNORMAL HIGH (ref 0.44–1.00)
GFR, Est AFR Am: 58 mL/min — ABNORMAL LOW (ref >60)
GFR, Estimated: 50 mL/min — ABNORMAL LOW (ref >60)
Glucose, Bld: 138 mg/dL — ABNORMAL HIGH (ref 70–99)
Potassium: 3.6 mmol/L (ref 3.5–5.1)
Sodium: 141 mmol/L (ref 135–145)
Total Bilirubin: 0.5 mg/dL (ref 0.3–1.2)
Total Protein: 5.9 g/dL — ABNORMAL LOW (ref 6.5–8.1)

## 2018-09-09 LAB — CBC (CANCER CENTER ONLY)
HCT: 30 % — ABNORMAL LOW (ref 36.0–46.0)
HEMOGLOBIN: 9.4 g/dL — AB (ref 12.0–15.0)
MCH: 27.6 pg (ref 26.0–34.0)
MCHC: 31.3 g/dL (ref 30.0–36.0)
MCV: 88 fL (ref 80.0–100.0)
Platelet Count: 101 10*3/uL — ABNORMAL LOW (ref 150–400)
RBC: 3.41 MIL/uL — ABNORMAL LOW (ref 3.87–5.11)
RDW: 14 % (ref 11.5–15.5)
WBC Count: 3.2 10*3/uL — ABNORMAL LOW (ref 4.0–10.5)
nRBC: 0 % (ref 0.0–0.2)

## 2018-09-09 LAB — SAMPLE TO BLOOD BANK

## 2018-09-09 MED ORDER — DIPHENHYDRAMINE HCL 25 MG PO CAPS
50.0000 mg | ORAL_CAPSULE | Freq: Once | ORAL | Status: AC
  Administered 2018-09-09: 50 mg via ORAL

## 2018-09-09 MED ORDER — DIPHENHYDRAMINE HCL 25 MG PO CAPS
ORAL_CAPSULE | ORAL | Status: AC
  Filled 2018-09-09: qty 2

## 2018-09-09 MED ORDER — SODIUM CHLORIDE 0.9 % IV SOLN
375.0000 mg/m2 | Freq: Once | INTRAVENOUS | Status: AC
  Administered 2018-09-09: 700 mg via INTRAVENOUS
  Filled 2018-09-09: qty 50

## 2018-09-09 MED ORDER — PEGFILGRASTIM INJECTION 6 MG/0.6ML ~~LOC~~
PREFILLED_SYRINGE | SUBCUTANEOUS | Status: AC
  Filled 2018-09-09: qty 0.6

## 2018-09-09 MED ORDER — HEPARIN SOD (PORK) LOCK FLUSH 100 UNIT/ML IV SOLN
500.0000 [IU] | Freq: Once | INTRAVENOUS | Status: AC | PRN
  Administered 2018-09-09: 500 [IU]
  Filled 2018-09-09: qty 5

## 2018-09-09 MED ORDER — PEGFILGRASTIM INJECTION 6 MG/0.6ML ~~LOC~~
6.0000 mg | PREFILLED_SYRINGE | Freq: Once | SUBCUTANEOUS | Status: AC
  Administered 2018-09-09: 6 mg via SUBCUTANEOUS

## 2018-09-09 MED ORDER — SODIUM CHLORIDE 0.9 % IV SOLN
Freq: Once | INTRAVENOUS | Status: AC
  Administered 2018-09-09: 09:00:00 via INTRAVENOUS
  Filled 2018-09-09: qty 250

## 2018-09-09 MED ORDER — SODIUM CHLORIDE 0.9% FLUSH
10.0000 mL | INTRAVENOUS | Status: DC | PRN
  Administered 2018-09-09: 10 mL
  Filled 2018-09-09: qty 10

## 2018-09-09 MED ORDER — ACETAMINOPHEN 325 MG PO TABS
ORAL_TABLET | ORAL | Status: AC
  Filled 2018-09-09: qty 2

## 2018-09-09 MED ORDER — ACETAMINOPHEN 325 MG PO TABS
650.0000 mg | ORAL_TABLET | Freq: Once | ORAL | Status: AC
  Administered 2018-09-09: 650 mg via ORAL

## 2018-09-09 NOTE — Progress Notes (Signed)
Met w/ pt to introduce myself as her Arboriculturist.  Unfortunately there aren't any foundations offering copay assistance for her Dx and the type of ins she has.  I offered the [place], went over what it covers, gave her an expense sheet and the income requirement.  She would like to apply so she will call me with her income and provide proof of income on 09/16/18.  She has my card for any questions or concerns she may have in the future.

## 2018-09-09 NOTE — Patient Instructions (Signed)
[facility] Discharge Instructions for Patients Receiving Chemotherapy  Today you received the following chemotherapy agents: rituxin  To help prevent nausea and vomiting after your treatment, we encourage you to take your nausea medicationAs directedIf you develop nausea and vomiting that is not controlled by your nausea medication, call the clinic.   BELOW ARE SYMPTOMS THAT SHOULD BE REPORTED IMMEDIATELY:  *FEVER GREATER THAN 100.5 F  *CHILLS WITH OR WITHOUT FEVER  NAUSEA AND VOMITING THAT IS NOT CONTROLLED WITH YOUR NAUSEA MEDICATION  *UNUSUAL SHORTNESS OF BREATH  *UNUSUAL BRUISING OR BLEEDING  TENDERNESS IN MOUTH AND THROAT WITH OR WITHOUT PRESENCE OF ULCERS  *URINARY PROBLEMS  *BOWEL PROBLEMS  UNUSUAL RASH Items with * indicate a potential emergency and should be followed up as soon as possible.  Feel free to call the clinic should you have any questions or concerns. The clinic phone number is [phone].  Please show the CHEMO ALERT CARD at check-in to the Emergency Department and triage nurse.   Rituximab injection What is this medicine? RITUXIMAB (ri TUX i mab) is a monoclonal antibody. It is used to treat certain types of cancer like non-Hodgkin lymphoma and chronic lymphocytic leukemia. It is also used to treat rheumatoid arthritis, granulomatosis with polyangiitis (or Wegener's granulomatosis), microscopic polyangiitis, and pemphigus vulgaris. This medicine may be used for other purposes; ask your health care provider or pharmacist if you have questions. COMMON BRAND NAME(S): Rituxan What should I tell my health care provider before I take this medicine? They need to know if you have any of these conditions: -heart disease -infection (especially a virus infection such as hepatitis B, chickenpox, cold sores, or herpes) -immune system problems -irregular heartbeat -kidney disease -low blood counts, like low white cell, platelet, or red cell  counts -lung or breathing disease, like asthma -recently received or scheduled to receive a vaccine -an unusual or allergic reaction to rituximab, other medicines, foods, dyes, or preservatives -pregnant or trying to get pregnant -breast-feeding How should I use this medicine? This medicine is for infusion into a vein. It is administered in a hospital or clinic by a specially trained health care professional. A special MedGuide will be given to you by the pharmacist with each prescription and refill. Be sure to read this information carefully each time. Talk to your pediatrician regarding the use of this medicine in children. This medicine is not approved for use in children. Overdosage: If you think you have taken too much of this medicine contact a poison control center or emergency room at once. NOTE: This medicine is only for you. Do not share this medicine with others. What if I miss a dose? It is important not to miss a dose. Call your doctor or health care professional if you are unable to keep an appointment. What may interact with this medicine? -cisplatin -live virus vaccines This list may not describe all possible interactions. Give your health care provider a list of all the medicines, herbs, non-prescription drugs, or dietary supplements you use. Also tell them if you smoke, drink alcohol, or use illegal drugs. Some items may interact with your medicine. What should I watch for while using this medicine? Your condition will be monitored carefully while you are receiving this medicine. You may need blood work done while you are taking this medicine. This medicine can cause serious allergic reactions. To reduce your risk you may need to take medicine before treatment with this medicine. Take your medicine as directed. In some patients,   this medicine may cause a serious brain infection that may cause death. If you have any problems seeing, thinking, speaking, walking, or standing, tell  your healthcare professional right away. If you cannot reach your healthcare professional, urgently seek other source of medical care. Call your doctor or health care professional for advice if you get a fever, chills or sore throat, or other symptoms of a cold or flu. Do not treat yourself. This drug decreases your body's ability to fight infections. Try to avoid being around people who are sick. Do not become pregnant while taking this medicine or for 12 months after stopping it. Women should inform their doctor if they wish to become pregnant or think they might be pregnant. There is a potential for serious side effects to an unborn child. Talk to your health care professional or pharmacist for more information. Do not breast-feed an infant while taking this medicine or for 6 months after stopping it. What side effects may I notice from receiving this medicine? Side effects that you should report to your doctor or health care professional as soon as possible: -allergic reactions like skin rash, itching or hives; swelling of the face, lips, or tongue -breathing problems -chest pain -changes in vision -diarrhea -headache with fever, neck stiffness, sensitivity to light, nausea, or confusion -fast, irregular heartbeat -loss of memory -low blood counts - this medicine may decrease the number of white blood cells, red blood cells and platelets. You may be at increased risk for infections and bleeding. -mouth sores -problems with balance, talking, or walking -redness, blistering, peeling or loosening of the skin, including inside the mouth -signs of infection - fever or chills, cough, sore throat, pain or difficulty passing urine -signs and symptoms of kidney injury like trouble passing urine or change in the amount of urine -signs and symptoms of liver injury like dark yellow or brown urine; general ill feeling or flu-like symptoms; light-colored stools; loss of appetite; nausea; right upper belly  pain; unusually weak or tired; yellowing of the eyes or skin -signs and symptoms of low blood pressure like dizziness; feeling faint or lightheaded, falls; unusually weak or tired -stomach pain -swelling of the ankles, feet, hands -unusual bleeding or bruising -vomiting Side effects that usually do not require medical attention (report to your doctor or health care professional if they continue or are bothersome): -headache -joint pain -muscle cramps or muscle pain -nausea -tiredness This list may not describe all possible side effects. Call your doctor for medical advice about side effects. You may report side effects to FDA at 1-800-FDA-1088. Where should I keep my medicine? This drug is given in a hospital or clinic and will not be stored at home. NOTE: This sheet is a summary. It may not cover all possible information. If you have questions about this medicine, talk to your doctor, pharmacist, or health care provider.  2019 Elsevier/Gold Standard (2017-06-22 13:04:32)   

## 2018-09-10 ENCOUNTER — Telehealth: Payer: Self-pay | Admitting: *Deleted

## 2018-09-10 NOTE — Progress Notes (Signed)
FMLA for daughter, Ryiah Bellissimo, successfully faxed to [employer] at [phone]. Mailed copy to daughter at [address]. [place], [place] [address].

## 2018-09-10 NOTE — Telephone Encounter (Signed)
Contacted patient after first chemo on 09/09/2018. Patient reports she is felling well. Encouraged to drink plenty of fluids and call office for questions or concerns. Patient verbalized understanding.

## 2018-09-13 NOTE — Progress Notes (Signed)
HEMATOLOGY/ONCOLOGY CLINIC NOTE  Date of Service: 09/16/2018  Patient Care Team: [name], MD as PCP - General (Internal Medicine)  CHIEF COMPLAINTS/PURPOSE OF CONSULTATION:  Large B-Cell Lymphoma  Oncologic History:  SHELLA Huynh presented with extranodal involvement in the terminal ileum s/p gross tumor resection in November 2010, and was diagnosed with High Grade B-Cell Non-Hodgkin's Lymphoma. She was treated with 4 cycles of R-CHOP between 07/05/09 and 09/06/09.  HISTORY OF PRESENTING ILLNESS:   Taylor Huynh is a wonderful 78 y.o. female who has been referred to [place] by Dr. Lawerance Cruel for evaluation and management of Large B-Cell Lymphoma. She is accompanied today by her daugher. The pt reports that she is doing well overall.  The pt reports that she found a lump in her right groin just before 07/17/18. She was evaluated by her PCP on 07/18/18 with a CT A/P, as noted below. She denies this lump changing much since she first noticed it, and endorses some soreness. The pt denies fevers, chills, night sweats or unexpected weight loss. Prior to this, the patient denies any changes in her quality of life or ability to function. She notes that her right leg became swollen yesterday as well. She denies changes in her bowel movements, abdominal pains, and left leg swelling. She denies any problems passing urine nor blood in the urine. She denies heart problems or recent chest pain or SOB. She denies changes in vision or headaches.   The pt has been eating well and endorses good appetite.   The pt takes Amlodipine every other day. She takes 33m Prilosec every day, as well as Vitamin B12 replacement. She denies stroke history, lung problems, nor heart problems. She notes a history of minor seizures, which she hasn't had since she began B12 replacement several years ago. The pt has never smoked cigarettes. She notes that she does not need help with any daily  activities and lives with her husband who also functions independently. She attends a local gym regularly.   Of note prior to the patient's visit today, pt has had a CT A/P completed on 07/18/18 with results revealing Extensive abdominal-pelvic adenopathy, consistent with recurrent lymphoma. 2. Tiny hiatal hernia. 3. Pelvic floor laxity. Aortic Atherosclerosis.  Most recent lab results (08/06/18) of CBC is as follows: all values are WNL except for RBC at 3.75, HGB at 10.6, HCT at 34.0  On review of systems, pt reports sore right inguinal lump, right leg swelling, good energy levels, sore right thigh, and denies changes in vision, headaches, abdominal pains, changes in bowel habits, problems passing urine, blood in the urine, left leg swelling, fevers, chills, night sweats, noticing other lumps or bumps, back pain, pain along the spine, calf pain, and any other symptoms.   On PMHx the pt reports High Grade B-Cell Non-Hodgkin's lymphoma in 2010, HTN, Vitamin B12 deficiency, minor seizures, GERD. On Social Hx the pt denies chemical exposure.  Interval History:   Taylor BLEVENSreturns today for management and evaluation of her newly diagnosed Recurrent/New High Grade B-Cell Non-Hodgkin's Lymphoma. I last saw the patient on 09/06/18 with her C1 dose reduced EPOCH-R discharge. She is accompanied today by her husband and daughter. The pt reports that she is doing well overall.  The pt reports that she has felt tired after completing her first infusion, but has continued to be able to do all she wants to do. She notes that she had some minimal bone pains the first day after  receiving her Neulasta shot. She notes that she is eating "alright," but her appetite is weaker, and denies nausea. She notes that she used biotene mouthwashes for some mouth soreness which , and did not use the salt and baking soda mouthwashes. She notes that she can no longer feel her right inguinal lymph node, and her right leg  swelling has completely resolved as well.  Lab results today (09/16/18) of CBC w/diff and CMP is as follows: all values are WNL except for WBC at 12.0k, RBC at 3.13, HGB at 8.6, HCT at 28.0, nRBC at 0.6%, ANC at 8.9k, Monocytes at 1.2k.  On review of systems, pt reports weaker appetite, lower energy levels, recent mouth sores, and denies leg swelling, nausea, and any other symptoms.  MEDICAL HISTORY:  Past Medical History:  Diagnosis Date  . GERD (gastroesophageal reflux disease)   . HH (hiatus hernia) 12/12/2011  . Hypertension   . Leukopenia 12/12/2011  . nhl dx'd 2010  . Seizures ([place]) reports "years ago"  . Syncope 12/12/2011   Hx seizures - not documented - age 63; recent syncope 6/12 and x2, 12/12; negative MRI brain 1/13    SURGICAL HISTORY: Past Surgical History:  Procedure Laterality Date  . COLON SURGERY    . IR IMAGING GUIDED PORT INSERTION  08/23/2018    SOCIAL HISTORY: Social History   Socioeconomic History  . Marital status: Married    Spouse name: Not on file  . Number of children: Not on file  . Years of education: Not on file  . Highest education level: Not on file  Occupational History  . Not on file  Social Needs  . Financial resource strain: Not on file  . Food insecurity:    Worry: Not on file    Inability: Not on file  . Transportation needs:    Medical: Not on file    Non-medical: Not on file  Tobacco Use  . Smoking status: Never Smoker  . Smokeless tobacco: Never Used  Substance and Sexual Activity  . Alcohol use: No  . Drug use: No  . Sexual activity: Not on file  Lifestyle  . Physical activity:    Days per week: Not on file    Minutes per session: Not on file  . Stress: Not on file  Relationships  . Social connections:    Talks on phone: Not on file    Gets together: Not on file    Attends religious service: Not on file    Active member of club or organization: Not on file    Attends meetings of clubs or organizations: Not on file     Relationship status: Not on file  . Intimate partner violence:    Fear of current or ex partner: Not on file    Emotionally abused: Not on file    Physically abused: Not on file    Forced sexual activity: Not on file  Other Topics Concern  . Not on file  Social History Narrative  . Not on file    FAMILY HISTORY: No family history on file.  ALLERGIES:  has No Known Allergies.  MEDICATIONS:  Current Outpatient Medications  Medication Sig Dispense Refill  . allopurinol (ZYLOPRIM) 100 MG tablet Take 1 tablet (100 mg total) by mouth daily. 15 tablet 0  . amLODipine (NORVASC) 10 MG tablet Take 10 mg by mouth daily.     . cholecalciferol (VITAMIN D) 1000 UNITS tablet Take 1,000 Units by mouth daily.      Marland Kitchen  furosemide (LASIX) 20 MG tablet Take 1 tablet (20 mg total) by mouth daily. Hold if you are dizzy. 30 tablet 0  . lidocaine-prilocaine (EMLA) cream Apply 1 application topically as needed. (Patient taking differently: Apply 1 application topically as needed (port access). ) 30 g 1  . Melatonin 5 MG TABS Take 5 mg by mouth daily.    Marland Kitchen omeprazole (PRILOSEC) 20 MG capsule Take 20 mg by mouth daily.     . polyethylene glycol (MIRALAX / GLYCOLAX) packet Take 17 g by mouth daily. 14 each 0  . potassium chloride SA (K-DUR,KLOR-CON) 20 MEQ tablet Take 1 tablet (20 mEq total) by mouth 2 (two) times daily. 30 tablet 0  . senna-docusate (SENOKOT-S) 8.6-50 MG tablet Take 2 tablets by mouth at bedtime as needed for mild constipation. 60 tablet 1  . vitamin B-12 (CYANOCOBALAMIN) 1000 MCG tablet Take 1,000 mcg by mouth every other day.     No current facility-administered medications for this visit.     REVIEW OF SYSTEMS:    A 10+ POINT REVIEW OF SYSTEMS WAS OBTAINED including neurology, dermatology, psychiatry, cardiac, respiratory, lymph, extremities, GI, GU, Musculoskeletal, constitutional, breasts, reproductive, HEENT.  All pertinent positives are noted in the HPI.  All others are negative.     PHYSICAL EXAMINATION: ECOG PERFORMANCE STATUS: 1 - Symptomatic but completely ambulatory  . Vitals:   09/16/18 1504  BP: 136/62  Pulse: (!) 102  Resp: 18  Temp: 98.4 F (36.9 C)  SpO2: 99%   Filed Weights   09/16/18 1504  Weight: 176 lb 12.8 oz (80.2 kg)   .Body mass index is 30.35 kg/m.  GENERAL:alert, in no acute distress and comfortable SKIN: no acute rashes, no significant lesions EYES: conjunctiva are pink and non-injected, sclera anicteric OROPHARYNX: MMM, no exudates, no oropharyngeal erythema or ulceration NECK: supple, no JVD LYMPH: Right inguinal 4-5cm LN, no palpable lymphadenopathy in the cervical or axillary regions LUNGS: clear to auscultation b/l with normal respiratory effort HEART: regular rate & rhythm ABDOMEN:  normoactive bowel sounds , non tender, not distended. No palpable hepatosplenomegaly.  Extremity: no pedal edema PSYCH: alert & oriented x 3 with fluent speech NEURO: no focal motor/sensory deficits   LABORATORY DATA:  I have reviewed the data as listed  . CBC Latest Ref Rng & Units 09/16/2018 09/09/2018 09/06/2018  WBC 4.0 - 10.5 K/uL 12.0(H) 3.2(L) 2.4(L)  Hemoglobin 12.0 - 15.0 g/dL 8.6(L) 9.4(L) 8.2(L)  Hematocrit 36.0 - 46.0 % 28.0(L) 30.0(L) 25.9(L)  Platelets 150 - 400 K/uL 217 101(L) 149(L)    . CMP Latest Ref Rng & Units 09/09/2018 09/06/2018 09/05/2018  Glucose 70 - 99 mg/dL 138(H) 122(H) 119(H)  BUN 8 - 23 mg/dL 19 20 24(H)  Creatinine 0.44 - 1.00 mg/dL 1.05(H) 0.84 0.89  Sodium 135 - 145 mmol/L 141 141 143  Potassium 3.5 - 5.1 mmol/L 3.6 3.3(L) 3.6  Chloride 98 - 111 mmol/L 103 110 114(H)  CO2 22 - 32 mmol/L '27 25 23  ' Calcium 8.9 - 10.3 mg/dL 9.2 9.1 9.3  Total Protein 6.5 - 8.1 g/dL 5.9(L) - -  Total Bilirubin 0.3 - 1.2 mg/dL 0.5 - -  Alkaline Phos 38 - 126 U/L 58 - -  AST 15 - 41 U/L 11(L) - -  ALT 0 - 44 U/L 15 - -   08/06/18 Biopsy:    06/01/09 Pathology:     07/18/18 CBC w/diff:    RADIOGRAPHIC STUDIES: I  have personally reviewed the radiological images as listed and  agreed with the findings in the report.  08/14/18 US Venous RLE:   Nm Pet Image Initial (pi) Skull Base To Thigh  Result Date: 08/22/2018 CLINICAL DATA:  Subsequent treatment strategy for restaging of B-cell lymphoma. Initially diagnosed with lymphoma in 2011. EXAM: NUCLEAR MEDICINE PET SKULL BASE TO THIGH TECHNIQUE: 10.0 mCi F-18 FDG was injected intravenously. Full-ring PET imaging was performed from the skull base to thigh after the radiotracer. CT data was obtained and used for attenuation correction and anatomic localization. Fasting blood glucose: 84 mg/dl COMPARISON:  09/22/2009 PET.  07/18/2018 abdominopelvic CT FINDINGS: Mediastinal blood pool activity: SUV max 2.3 NECK: No areas of abnormal hypermetabolism. Incidental CT findings: Bilateral carotid atherosclerosis. No cervical adenopathy. CHEST: No pulmonary parenchymal or thoracic nodal hypermetabolism. Incidental CT findings: Mild cardiomegaly. Aortic atherosclerosis. Tiny hiatal hernia. Small bilateral thyroid nodules are nonspecific. No thoracic adenopathy. Inferior left-sided pleural fat proliferation versus small left-sided Bochdalek's hernia. ABDOMEN/PELVIS: Hypermetabolism corresponding to the previously described abdominopelvic adenopathy. Left periaortic adenopathy at up to 2.2 cm and a S.U.V. max of 35.3 on image 128/4. This node is increased from 1.3 cm on the prior. Right external iliac adenopathy measures 4.4 cm and a S.U.V. max of 39.3 on image 167/4. Compare 3.2 cm on the prior diagnostic CT. Right inguinal node measures 5.4 x 5.6 cm and a S.U.V. max of 43.7 on image 185/4. Enlarged from 4.4 x 4.0 cm on 07/18/2018. Focus of hypermetabolism within the anus is without CT correlate. Incidental CT findings: Abdominal aortic atherosclerosis. Moderate hepatic steatosis. Cholecystectomy. Left renal cyst. Pelvic floor laxity. SKELETON: Focus of eighth right lateral rib  hypermetabolism is without CT correlate, including at a S.U.V. max of 8.8 on image 91/4. A left transverse process focus of hypermetabolism measures a S.U.V. max of 6.1 at approximately the T6 level. Incidental CT findings: none IMPRESSION: 1. Progression, since 66/12/3014, of hypermetabolic abdominopelvic adenopathy, consistent with active lymphoma. 2. CT occult hypermetabolic osseous foci, also most consistent with active lymphoma. 3. No evidence of soft tissue disease above the diaphragm. 4. Focus of hypermetabolism within the anus is likely physiologic. Consider physical exam correlation. 5.  Aortic Atherosclerosis (ICD10-I70.0). Electronically Signed   By: Abigail Miyamoto M.D.   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 Port-A-Cath needed for treatment. EXAM: FLUOROSCOPIC AND ULTRASOUND GUIDED PLACEMENT OF A SUBCUTANEOUS PORT COMPARISON:  None. MEDICATIONS: Ancef 2 g; The antibiotic was administered within an appropriate time interval prior to skin puncture. ANESTHESIA/SEDATION: Versed 2.0 mg IV; Fentanyl 100 mcg IV; Moderate Sedation Time:  30 minutes The patient was continuously monitored during the procedure by the interventional radiology nurse under my direct supervision. FLUOROSCOPY TIME:  12 seconds, 3 mGy COMPLICATIONS: None immediate. PROCEDURE: The procedure, risks, benefits, and alternatives were explained to the patient. Questions regarding the procedure were encouraged and answered. The patient understands and consents to the procedure. Patient was placed supine on the interventional table. Ultrasound confirmed a patent right internal jugular vein. Ultrasound image was saved for documentation. The right chest and neck were cleaned with a skin antiseptic and a sterile drape was placed. Maximal barrier sterile technique was utilized including caps, mask, sterile gowns, sterile gloves, sterile drape,  hand hygiene and skin antiseptic. The right neck was anesthetized with 1% lidocaine. Small incision was made in the right neck with a blade. Micropuncture set was placed in the right internal jugular vein with ultrasound guidance. The micropuncture wire  was used for measurement purposes. The right chest was anesthetized with 1% lidocaine with epinephrine. #15 blade was used to make an incision and a subcutaneous port pocket was formed. Capron was assembled. Subcutaneous tunnel was formed with a stiff tunneling device. The port catheter was brought through the subcutaneous tunnel. The port was placed in the subcutaneous pocket and sutured in place. The micropuncture set was exchanged for a peel-away sheath. The catheter was placed through the peel-away sheath and the tip was positioned at the superior cavoatrial junction. Catheter placement was confirmed with fluoroscopy. The port was accessed and flushed with heparinized saline. The port pocket was closed using two layers of absorbable sutures and Dermabond. The vein skin site was closed using a single layer of absorbable suture and Dermabond. Sterile dressings were applied. Patient tolerated the procedure well without an immediate complication. Ultrasound and fluoroscopic images were taken and saved for this procedure. IMPRESSION: Placement of a subcutaneous port device. Catheter tip at the superior cavoatrial junction. Electronically Signed   By: Markus Daft M.D.   On: 08/23/2018 16:37    ASSESSMENT & PLAN:   78 y.o. female with  1. 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 Newly Diagnosed Recurrent/New High Grade B-Cell Non-Hodgkin's Lymphoma, Stage IV FISH negative for findings of double hit lymphoma  07/18/18 CT A/P revealed Extensive abdominal-pelvic adenopathy, consistent with recurrent lymphoma. 2. Tiny hiatal hernia. 3. Pelvic floor laxity. Aortic Atherosclerosis.  Labs upon initial presentation from1/14/20, WBC normal at 4.3k, HGB at 10.6, PLT normal at 201k  08/06/18 Right Inguinal LN Biopsy which revealed Large B-Cell Lymphoma with a Ki67 of 90%. FISH is negative for rearrangements of BCL2, BCL6, and MYC. 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  08/13/18 Hep B and Hep C negative  08/21/18 ECHO which revealed LV EF of 50-55%  08/22/18 PET/CT revealedProgression, since 67/59/1638, of hypermetabolic abdominopelvic adenopathy, consistent with active lymphoma. 2. CT occult hypermetabolic osseous foci, also most consistent with active lymphoma. 3. No evidence of soft tissue disease above the diaphragm. 4. Focus of hypermetabolism within the anus is likely physiologic. Consider physical exam correlation. 5. Aortic Atherosclerosis.  3. RLE swelling r/o DVT. Could be from lymphedema related to her new/recurrent NHL 1/21/10US Venous RLE to rule out DVT - reviewed - no VTE  4. Hypokalemia resolved K 3.3  5. Pancytopenia due to chemotherapy and DLBCL  6. 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 No evidence of bleeding  7. DVT Px - lovenox [place]   PLAN: -Discussed pt labwork today, 09/16/18; HGB holding at 8.6, PLT normalized to 217k, other labs are pending -Salt and baking soda mouthwashes -Emphasized the importance of eating well -The pt has no prohibitive toxicities from C1 dose reduced EPOCH-R at this time. Dose reduced for age and previous chemotherapy exposure -Will continue watching HGB, and will support with PRBC transfusion if necessary for hgb<8.. Pt denies light headedness,  fatigue, and dizziness at this time.  -Miralax and Senna S -Allopurinol 134m po daily for TLS prophylaxis with C1 of treatment - uric acid WNL -borderline low K, mg and phosphoryus-- recommended dietary optimization. -Will  gauge indications for larger doses pending her display of tolerance  -Will evaluate pt with PET/CT after C2 or C3 for evaluation of treatment efficacy. -Will plan admission C2 dose reduced EPOCH-R on 09/23/18   Please schedule for inpatient [facility] for 5 days from 09/23/2018 Outpatient neulasta and Rituxan on 09/30/2018 RTC with Dr Irene Limbo with labs in 3 weeks   All of the patients questions were answered with apparent satisfaction. The patient knows to call the clinic with any problems, questions or concerns.  The total time spent in the appt was 25 minutes and more than 50% was on counseling and direct patient cares.    Sullivan Lone MD MS AAHIVMS [facility] Hematology/Oncology Physician [facility]  (Office):       [phone] (Work cell):  [phone] (Fax):           [phone]  09/16/2018 3:28 PM  I, Baldwin [place], am acting as a scribe for Dr. Sullivan Lone.   .I have reviewed the above documentation for accuracy and completeness, and I agree with the above. Brunetta Genera MD

## 2018-09-16 ENCOUNTER — Inpatient Hospital Stay: Payer: Medicare Other | Admitting: Hematology

## 2018-09-16 ENCOUNTER — Telehealth: Payer: Self-pay | Admitting: Hematology

## 2018-09-16 ENCOUNTER — Other Ambulatory Visit: Payer: Self-pay | Admitting: Hematology

## 2018-09-16 ENCOUNTER — Inpatient Hospital Stay: Payer: Medicare Other

## 2018-09-16 ENCOUNTER — Encounter: Payer: Self-pay | Admitting: Hematology

## 2018-09-16 VITALS — BP 136/62 | HR 102 | Temp 98.4°F | Resp 18 | Ht 64.0 in | Wt 176.8 lb

## 2018-09-16 DIAGNOSIS — K219 Gastro-esophageal reflux disease without esophagitis: Secondary | ICD-10-CM

## 2018-09-16 DIAGNOSIS — Z8669 Personal history of other diseases of the nervous system and sense organs: Secondary | ICD-10-CM

## 2018-09-16 DIAGNOSIS — C8338 Diffuse large B-cell lymphoma, lymph nodes of multiple sites: Secondary | ICD-10-CM | POA: Diagnosis not present

## 2018-09-16 DIAGNOSIS — Z7689 Persons encountering health services in other specified circumstances: Secondary | ICD-10-CM | POA: Diagnosis not present

## 2018-09-16 DIAGNOSIS — Z79899 Other long term (current) drug therapy: Secondary | ICD-10-CM

## 2018-09-16 DIAGNOSIS — I700 Atherosclerosis of aorta: Secondary | ICD-10-CM

## 2018-09-16 DIAGNOSIS — E538 Deficiency of other specified B group vitamins: Secondary | ICD-10-CM

## 2018-09-16 DIAGNOSIS — I10 Essential (primary) hypertension: Secondary | ICD-10-CM

## 2018-09-16 DIAGNOSIS — R590 Localized enlarged lymph nodes: Secondary | ICD-10-CM

## 2018-09-16 DIAGNOSIS — E042 Nontoxic multinodular goiter: Secondary | ICD-10-CM

## 2018-09-16 DIAGNOSIS — K449 Diaphragmatic hernia without obstruction or gangrene: Secondary | ICD-10-CM

## 2018-09-16 DIAGNOSIS — K760 Fatty (change of) liver, not elsewhere classified: Secondary | ICD-10-CM

## 2018-09-16 DIAGNOSIS — Z5112 Encounter for antineoplastic immunotherapy: Secondary | ICD-10-CM | POA: Diagnosis not present

## 2018-09-16 DIAGNOSIS — N281 Cyst of kidney, acquired: Secondary | ICD-10-CM

## 2018-09-16 DIAGNOSIS — R599 Enlarged lymph nodes, unspecified: Secondary | ICD-10-CM | POA: Diagnosis not present

## 2018-09-16 DIAGNOSIS — I6523 Occlusion and stenosis of bilateral carotid arteries: Secondary | ICD-10-CM

## 2018-09-16 DIAGNOSIS — R569 Unspecified convulsions: Secondary | ICD-10-CM

## 2018-09-16 LAB — CBC WITH DIFFERENTIAL/PLATELET
Abs Immature Granulocytes: 1.07 10*3/uL — ABNORMAL HIGH (ref 0.00–0.07)
Basophils Absolute: 0 10*3/uL (ref 0.0–0.1)
Basophils Relative: 0 %
Eosinophils Absolute: 0 10*3/uL (ref 0.0–0.5)
Eosinophils Relative: 0 %
HCT: 28 % — ABNORMAL LOW (ref 36.0–46.0)
Hemoglobin: 8.6 g/dL — ABNORMAL LOW (ref 12.0–15.0)
Immature Granulocytes: 9 %
Lymphocytes Relative: 6 %
Lymphs Abs: 0.7 10*3/uL (ref 0.7–4.0)
MCH: 27.5 pg (ref 26.0–34.0)
MCHC: 30.7 g/dL (ref 30.0–36.0)
MCV: 89.5 fL (ref 80.0–100.0)
MONO ABS: 1.2 10*3/uL — AB (ref 0.1–1.0)
Monocytes Relative: 10 %
Neutro Abs: 8.9 10*3/uL — ABNORMAL HIGH (ref 1.7–7.7)
Neutrophils Relative %: 75 %
Platelets: 217 10*3/uL (ref 150–400)
RBC: 3.13 MIL/uL — ABNORMAL LOW (ref 3.87–5.11)
RDW: 14.3 % (ref 11.5–15.5)
WBC: 12 10*3/uL — ABNORMAL HIGH (ref 4.0–10.5)
nRBC: 0.6 % — ABNORMAL HIGH (ref 0.0–0.2)

## 2018-09-16 LAB — CMP (CANCER CENTER ONLY)
ALK PHOS: 92 U/L (ref 38–126)
ALT: 12 U/L (ref 0–44)
AST: 16 U/L (ref 15–41)
Albumin: 3.1 g/dL — ABNORMAL LOW (ref 3.5–5.0)
Anion gap: 11 (ref 5–15)
BUN: 8 mg/dL (ref 8–23)
CO2: 25 mmol/L (ref 22–32)
Calcium: 8.4 mg/dL — ABNORMAL LOW (ref 8.9–10.3)
Chloride: 105 mmol/L (ref 98–111)
Creatinine: 1.18 mg/dL — ABNORMAL HIGH (ref 0.44–1.00)
GFR, EST NON AFRICAN AMERICAN: 44 mL/min — AB (ref >60)
GFR, Est AFR Am: 51 mL/min — ABNORMAL LOW (ref >60)
Glucose, Bld: 111 mg/dL — ABNORMAL HIGH (ref 70–99)
Potassium: 3.3 mmol/L — ABNORMAL LOW (ref 3.5–5.1)
Sodium: 141 mmol/L (ref 135–145)
Total Protein: 6.5 g/dL (ref 6.5–8.1)

## 2018-09-16 LAB — PHOSPHORUS: Phosphorus: 2.4 mg/dL — ABNORMAL LOW (ref 2.5–4.6)

## 2018-09-16 LAB — MAGNESIUM: Magnesium: 1.5 mg/dL — ABNORMAL LOW (ref 1.7–2.4)

## 2018-09-16 LAB — URIC ACID: Uric Acid, Serum: 5.8 mg/dL (ref 2.5–7.1)

## 2018-09-16 NOTE — Progress Notes (Signed)
Pt is approved for the $700 [facility] grant.  °

## 2018-09-16 NOTE — Telephone Encounter (Signed)
Gave avs and calendar ° °

## 2018-09-16 NOTE — Progress Notes (Signed)
Cbc

## 2018-09-17 ENCOUNTER — Telehealth: Payer: Self-pay | Admitting: *Deleted

## 2018-09-17 NOTE — Telephone Encounter (Signed)
Per Dr. Irene Limbo, schedule for admission to [facility] for AM admit 3/2.  Patient demographics given to Robin in bed placement.  Sent email to [email or web address] and notified infusion pharmacy of pending admission.

## 2018-09-23 ENCOUNTER — Other Ambulatory Visit: Payer: Self-pay

## 2018-09-23 ENCOUNTER — Inpatient Hospital Stay (HOSPITAL_COMMUNITY)
Admission: AD | Admit: 2018-09-23 | Discharge: 2018-09-27 | DRG: 846 | Disposition: A | Discharge status: Home / Self Care | Payer: Medicare Other | Attending: Hematology | Admitting: Hematology

## 2018-09-23 ENCOUNTER — Encounter (HOSPITAL_COMMUNITY): Payer: Self-pay

## 2018-09-23 DIAGNOSIS — K1230 Oral mucositis (ulcerative), unspecified: Secondary | ICD-10-CM

## 2018-09-23 DIAGNOSIS — E876 Hypokalemia: Secondary | ICD-10-CM | POA: Diagnosis present

## 2018-09-23 DIAGNOSIS — D61810 Antineoplastic chemotherapy induced pancytopenia: Secondary | ICD-10-CM | POA: Diagnosis present

## 2018-09-23 DIAGNOSIS — Z7189 Other specified counseling: Secondary | ICD-10-CM

## 2018-09-23 DIAGNOSIS — Z79899 Other long term (current) drug therapy: Secondary | ICD-10-CM

## 2018-09-23 DIAGNOSIS — Z5111 Encounter for antineoplastic chemotherapy: Secondary | ICD-10-CM | POA: Diagnosis not present

## 2018-09-23 DIAGNOSIS — K219 Gastro-esophageal reflux disease without esophagitis: Secondary | ICD-10-CM | POA: Diagnosis present

## 2018-09-23 DIAGNOSIS — D649 Anemia, unspecified: Secondary | ICD-10-CM | POA: Diagnosis not present

## 2018-09-23 DIAGNOSIS — K5900 Constipation, unspecified: Secondary | ICD-10-CM | POA: Diagnosis present

## 2018-09-23 DIAGNOSIS — R569 Unspecified convulsions: Secondary | ICD-10-CM | POA: Diagnosis present

## 2018-09-23 DIAGNOSIS — I700 Atherosclerosis of aorta: Secondary | ICD-10-CM | POA: Diagnosis present

## 2018-09-23 DIAGNOSIS — C8330 Diffuse large B-cell lymphoma, unspecified site: Secondary | ICD-10-CM | POA: Diagnosis present

## 2018-09-23 DIAGNOSIS — C8338 Diffuse large B-cell lymphoma, lymph nodes of multiple sites: Secondary | ICD-10-CM

## 2018-09-23 DIAGNOSIS — R040 Epistaxis: Secondary | ICD-10-CM | POA: Diagnosis present

## 2018-09-23 DIAGNOSIS — Z111 Encounter for screening for respiratory tuberculosis: Secondary | ICD-10-CM | POA: Diagnosis present

## 2018-09-23 DIAGNOSIS — T451X5A Adverse effect of antineoplastic and immunosuppressive drugs, initial encounter: Secondary | ICD-10-CM | POA: Diagnosis present

## 2018-09-23 DIAGNOSIS — I10 Essential (primary) hypertension: Secondary | ICD-10-CM | POA: Diagnosis present

## 2018-09-23 LAB — CBC WITH DIFFERENTIAL/PLATELET
ABS IMMATURE GRANULOCYTES: 0.23 10*3/uL — AB (ref 0.00–0.07)
Basophils Absolute: 0 10*3/uL (ref 0.0–0.1)
Basophils Relative: 0 %
Eosinophils Absolute: 0 10*3/uL (ref 0.0–0.5)
Eosinophils Relative: 1 %
HCT: 28.3 % — ABNORMAL LOW (ref 36.0–46.0)
Hemoglobin: 8.8 g/dL — ABNORMAL LOW (ref 12.0–15.0)
Immature Granulocytes: 3 %
LYMPHS ABS: 1.2 10*3/uL (ref 0.7–4.0)
Lymphocytes Relative: 14 %
MCH: 28.1 pg (ref 26.0–34.0)
MCHC: 31.1 g/dL (ref 30.0–36.0)
MCV: 90.4 fL (ref 80.0–100.0)
Monocytes Absolute: 0.8 10*3/uL (ref 0.1–1.0)
Monocytes Relative: 10 %
Neutro Abs: 6.4 10*3/uL (ref 1.7–7.7)
Neutrophils Relative %: 72 %
Platelets: 438 10*3/uL — ABNORMAL HIGH (ref 150–400)
RBC: 3.13 MIL/uL — ABNORMAL LOW (ref 3.87–5.11)
RDW: 14.6 % (ref 11.5–15.5)
WBC: 8.7 10*3/uL (ref 4.0–10.5)
nRBC: 0 % (ref 0.0–0.2)

## 2018-09-23 LAB — COMPREHENSIVE METABOLIC PANEL
ALT: 13 U/L (ref 0–44)
ANION GAP: 9 (ref 5–15)
AST: 14 U/L — ABNORMAL LOW (ref 15–41)
Albumin: 3.5 g/dL (ref 3.5–5.0)
Alkaline Phosphatase: 73 U/L (ref 38–126)
BUN: 14 mg/dL (ref 8–23)
CO2: 26 mmol/L (ref 22–32)
Calcium: 5.7 mg/dL — CL (ref 8.9–10.3)
Chloride: 106 mmol/L (ref 98–111)
Creatinine, Ser: 0.85 mg/dL (ref 0.44–1.00)
GFR calc Af Amer: >60 mL/min (ref >60)
GFR calc non Af Amer: >60 mL/min (ref >60)
GLUCOSE: 107 mg/dL — AB (ref 70–99)
Potassium: 4.6 mmol/L (ref 3.5–5.1)
Sodium: 141 mmol/L (ref 135–145)
Total Bilirubin: 0.5 mg/dL (ref 0.3–1.2)
Total Protein: 6.7 g/dL (ref 6.5–8.1)

## 2018-09-23 LAB — MAGNESIUM: Magnesium: 1.2 mg/dL — ABNORMAL LOW (ref 1.7–2.4)

## 2018-09-23 LAB — PHOSPHORUS: Phosphorus: 2.6 mg/dL (ref 2.5–4.6)

## 2018-09-23 LAB — URIC ACID: Uric Acid, Serum: 5.1 mg/dL (ref 2.5–7.1)

## 2018-09-23 MED ORDER — FUROSEMIDE 20 MG PO TABS
20.0000 mg | ORAL_TABLET | Freq: Every day | ORAL | Status: DC
  Administered 2018-09-23 – 2018-09-26 (×4): 20 mg via ORAL
  Filled 2018-09-23 (×4): qty 1

## 2018-09-23 MED ORDER — SODIUM BICARBONATE/SODIUM CHLORIDE MOUTHWASH
Freq: Four times a day (QID) | OROMUCOSAL | Status: DC
  Administered 2018-09-23 – 2018-09-26 (×13): via OROMUCOSAL
  Filled 2018-09-23: qty 1000

## 2018-09-23 MED ORDER — SODIUM CHLORIDE 0.9 % IV SOLN
Freq: Once | INTRAVENOUS | Status: AC
  Administered 2018-09-23: 18 mg via INTRAVENOUS
  Filled 2018-09-23: qty 4

## 2018-09-23 MED ORDER — POTASSIUM CHLORIDE CRYS ER 20 MEQ PO TBCR
20.0000 meq | EXTENDED_RELEASE_TABLET | Freq: Two times a day (BID) | ORAL | Status: DC
  Administered 2018-09-23 – 2018-09-24 (×2): 20 meq via ORAL
  Filled 2018-09-23 (×2): qty 1

## 2018-09-23 MED ORDER — CALCIUM CARBONATE-VITAMIN D 500-200 MG-UNIT PO TABS
2.0000 | ORAL_TABLET | Freq: Two times a day (BID) | ORAL | Status: DC
  Administered 2018-09-23 – 2018-09-26 (×7): 2 via ORAL
  Filled 2018-09-23 (×9): qty 2

## 2018-09-23 MED ORDER — SODIUM CHLORIDE 0.9 % IV SOLN
INTRAVENOUS | Status: DC
  Administered 2018-09-23 – 2018-09-25 (×2): via INTRAVENOUS

## 2018-09-23 MED ORDER — PREDNISONE 20 MG PO TABS
60.0000 mg | ORAL_TABLET | Freq: Every day | ORAL | Status: AC
  Administered 2018-09-23 – 2018-09-27 (×5): 60 mg via ORAL
  Filled 2018-09-23 (×5): qty 3

## 2018-09-23 MED ORDER — CALCIUM GLUCONATE-NACL 1-0.675 GM/50ML-% IV SOLN
1.0000 g | Freq: Once | INTRAVENOUS | Status: AC
  Administered 2018-09-23: 1000 mg via INTRAVENOUS
  Filled 2018-09-23: qty 50

## 2018-09-23 MED ORDER — SENNOSIDES-DOCUSATE SODIUM 8.6-50 MG PO TABS
2.0000 | ORAL_TABLET | Freq: Every day | ORAL | Status: DC
  Administered 2018-09-24 – 2018-09-26 (×3): 2 via ORAL
  Filled 2018-09-23 (×3): qty 2

## 2018-09-23 MED ORDER — MAGNESIUM OXIDE 400 (241.3 MG) MG PO TABS
400.0000 mg | ORAL_TABLET | Freq: Two times a day (BID) | ORAL | Status: DC
  Administered 2018-09-23 – 2018-09-26 (×7): 400 mg via ORAL
  Filled 2018-09-23 (×9): qty 1

## 2018-09-23 MED ORDER — HOT PACK MISC ONCOLOGY
1.0000 | Freq: Once | Status: DC | PRN
  Filled 2018-09-23: qty 1

## 2018-09-23 MED ORDER — MAGNESIUM SULFATE 2 GM/50ML IV SOLN
2.0000 g | Freq: Once | INTRAVENOUS | Status: AC
  Administered 2018-09-23: 2 g via INTRAVENOUS
  Filled 2018-09-23: qty 50

## 2018-09-23 MED ORDER — POLYETHYLENE GLYCOL 3350 17 G PO PACK
17.0000 g | PACK | Freq: Every day | ORAL | Status: DC
  Administered 2018-09-24 – 2018-09-25 (×2): 17 g via ORAL
  Filled 2018-09-23 (×4): qty 1

## 2018-09-23 MED ORDER — VINCRISTINE SULFATE CHEMO INJECTION 1 MG/ML
Freq: Once | INTRAVENOUS | Status: AC
  Administered 2018-09-23: 16:00:00 via INTRAVENOUS
  Filled 2018-09-23: qty 6

## 2018-09-23 MED ORDER — COLD PACK MISC ONCOLOGY
1.0000 | Freq: Once | Status: DC | PRN
  Filled 2018-09-23: qty 1

## 2018-09-23 MED ORDER — AMLODIPINE BESYLATE 10 MG PO TABS
10.0000 mg | ORAL_TABLET | Freq: Every day | ORAL | Status: DC
  Administered 2018-09-24 – 2018-09-26 (×3): 10 mg via ORAL
  Filled 2018-09-23 (×4): qty 1

## 2018-09-23 MED ORDER — ENOXAPARIN SODIUM 40 MG/0.4ML ~~LOC~~ SOLN
40.0000 mg | SUBCUTANEOUS | Status: DC
  Administered 2018-09-23 – 2018-09-26 (×4): 40 mg via SUBCUTANEOUS
  Filled 2018-09-23 (×4): qty 0.4

## 2018-09-23 NOTE — H&P (Signed)
HEMATOLOGY/ONCOLOGY H&P NOTE  Date of Service: 09/23/2018  Patient Care Team: [name], MD as PCP - General (Internal Medicine)  CHIEF COMPLAINTS/PURPOSE OF CONSULTATION:  Large B-Cell Lymphoma  Oncologic History:  Taylor Huynh presented with extranodal involvement in the terminal ileum s/p gross tumor resection in November 2010, and was diagnosed with High Grade B-Cell Non-Hodgkin's Lymphoma. She was treated with 4 cycles of R-CHOP between 07/05/09 and 09/06/09.  HISTORY OF PRESENTING ILLNESS:   Taylor Huynh is admitted today for her second cycle of dose of adjusted EPOCH today for management  of her recently diagnosed Recurrent/New High Grade B-Cell Non-Hodgkin's Lymphoma. I last saw the patient on 09/16/18 in the office. She is accompanied by her family members today.   She hasn't been taking the lasix due to resolved leg swelling. She notes that food has been tasting better and she has been able to eat more food.  Notes no acute new symptoms or concerns.  She reports that her right inguinal lymphadenopathy has completely resolved.  Labs today, 09/23/2018 show CBC w/diff with RBC at 3.13, Hgb at 8.8, PLT at 438K.   On review of systems, she reports dry nose x 1 week, nasal bleeding noted on the tissue when she blows her nose. she denies fever, chills, night sweats, abdominal pain, bowel issues, and any other symptoms. Pertinent positives are listed and detailed within the above HPI.   MEDICAL HISTORY:  Past Medical History:  Diagnosis Date  . GERD (gastroesophageal reflux disease)   . HH (hiatus hernia) 12/12/2011  . Hypertension   . Leukopenia 12/12/2011  . nhl dx'd 2010  . Seizures ([place]) reports "years ago"  . Syncope 12/12/2011   Hx seizures - not documented - age 59; recent syncope 6/12 and x2, 12/12; negative MRI brain 1/13    SURGICAL HISTORY: Past Surgical History:  Procedure Laterality Date  . COLON SURGERY    . IR IMAGING GUIDED PORT  INSERTION  08/23/2018    SOCIAL HISTORY: Social History   Socioeconomic History  . Marital status: Married    Spouse name: Not on file  . Number of children: Not on file  . Years of education: Not on file  . Highest education level: Not on file  Occupational History  . Not on file  Social Needs  . Financial resource strain: Not on file  . Food insecurity:    Worry: Not on file    Inability: Not on file  . Transportation needs:    Medical: Not on file    Non-medical: Not on file  Tobacco Use  . Smoking status: Never Smoker  . Smokeless tobacco: Never Used  Substance and Sexual Activity  . Alcohol use: No  . Drug use: No  . Sexual activity: Not on file  Lifestyle  . Physical activity:    Days per week: Not on file    Minutes per session: Not on file  . Stress: Not on file  Relationships  . Social connections:    Talks on phone: Not on file    Gets together: Not on file    Attends religious service: Not on file    Active member of club or organization: Not on file    Attends meetings of clubs or organizations: Not on file    Relationship status: Not on file  . Intimate partner violence:    Fear of current or ex partner: Not on file    Emotionally abused: Not on file  Physically abused: Not on file    Forced sexual activity: Not on file  Other Topics Concern  . Not on file  Social History Narrative  . Not on file    FAMILY HISTORY: History reviewed. No pertinent family history.  ALLERGIES:  has No Known Allergies.  MEDICATIONS:  Current Facility-Administered Medications  Medication Dose Route Frequency Provider Last Rate Last Dose  . [START ON 09/24/2018] amLODipine (NORVASC) tablet 10 mg  10 mg Oral Daily Brunetta Genera, MD      . enoxaparin (LOVENOX) injection 40 mg  40 mg Subcutaneous Q24H Brunetta Genera, MD      . furosemide (LASIX) tablet 20 mg  20 mg Oral Daily Daxton Nydam, Cloria Spring, MD      . polyethylene glycol (MIRALAX / GLYCOLAX) packet  17 g  17 g Oral Daily Brunetta Genera, MD      . potassium chloride SA (K-DUR,KLOR-CON) CR tablet 20 mEq  20 mEq Oral BID Brunetta Genera, MD      . senna-docusate (Senokot-S) tablet 2 tablet  2 tablet Oral QHS Brunetta Genera, MD        REVIEW OF SYSTEMS:    A 10+ POINT REVIEW OF SYSTEMS WAS OBTAINED including neurology, dermatology, psychiatry, cardiac, respiratory, lymph, extremities, GI, GU, Musculoskeletal, constitutional, breasts, reproductive, HEENT.  All pertinent positives are noted in the HPI.  All others are negative.    PHYSICAL EXAMINATION: ECOG PERFORMANCE STATUS: 1 - Symptomatic but completely ambulatory  . Vitals:   09/23/18 0932  BP: 130/73  Pulse: 82  Resp: 16  Temp: (!) 97.5 F (36.4 C)  SpO2: 99%   Filed Weights   09/23/18 0932  Weight: 176 lb 3.2 oz (79.9 kg)   .Body mass index is 30.24 kg/m.  GENERAL:alert, in no acute distress and comfortable SKIN: no acute rashes, no significant lesions EYES: conjunctiva are pink and non-injected, sclera anicteric OROPHARYNX: MMM, no exudates, no oropharyngeal erythema or ulceration NECK: supple, no JVD LYMPH: no palpable lymphadenopathy in the inguinal cervical or axillary regions LUNGS: clear to auscultation b/l with normal respiratory effort HEART: regular rate & rhythm ABDOMEN:  normoactive bowel sounds , non tender, not distended. No palpable hepatosplenomegaly.  Extremity: no pedal edema PSYCH: alert & oriented x 3 with fluent speech NEURO: no focal motor/sensory deficits   LABORATORY DATA:  I have reviewed the data as listed  . CBC Latest Ref Rng & Units 09/23/2018 09/16/2018 09/09/2018  WBC 4.0 - 10.5 K/uL 8.7 12.0(H) 3.2(L)  Hemoglobin 12.0 - 15.0 g/dL 8.8(L) 8.6(L) 9.4(L)  Hematocrit 36.0 - 46.0 % 28.3(L) 28.0(L) 30.0(L)  Platelets 150 - 400 K/uL 438(H) 217 101(L)    . CMP Latest Ref Rng & Units 09/23/2018 09/16/2018 09/09/2018  Glucose 70 - 99 mg/dL 107(H) 111(H) 138(H)  BUN 8 - 23  mg/dL _0 Creatinine 0.44 - 1.00 mg/dL 0.85 1.18(H) 1.05(H)  Sodium 135 - 145 mmol/L 141 141 141  Potassium 3.5 - 5.1 mmol/L 4.6 3.3(L) 3.6  Chloride 98 - 111 mmol/L 106 105 103  CO2 22 - 32 mmol/L _1 Calcium 8.9 - 10.3 mg/dL 5.7(LL) 8.4(L) 9.2  Total Protein 6.5 - 8.1 g/dL 6.7 6.5 5.9(L)  Total Bilirubin 0.3 - 1.2 mg/dL 0.5 <0.2(L) 0.5  Alkaline Phos 38 - 126 U/L 73 92 58  AST 15 - 41 U/L 14(L) 16 11(L)  ALT 0 - 44 U/L _2 Component     Latest  Ref Rng & Units 09/23/2018  Magnesium     1.7 - 2.4 mg/dL 1.2 (L)  Phosphorus     2.5 - 4.6 mg/dL 2.6    08/06/18 Biopsy:    06/01/09 Pathology:     07/18/18 CBC w/diff:    RADIOGRAPHIC STUDIES: I have personally reviewed the radiological images as listed and agreed with the findings in the report.  08/14/18 US Venous RLE:   No results found.  ASSESSMENT & PLAN:   78 y.o. female with  1. 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 2. Tiny hiatal hernia. 3. Pelvic floor laxity. Aortic Atherosclerosis.  Labs upon initial presentation from1/14/20, WBC normal at 4.3k, HGB at 10.6, PLT normal at 201k  08/06/18 Right Inguinal LN Biopsy which revealed Large B-Cell Lymphoma with a Ki67 of 90%. FISH is negative for rearrangements of BCL2, BCL6, and MYC. Thus, the findings are  consistent with a diffuse large B-cell lymphoma.  08/13/18 Hep B and Hep C negative  08/21/18 ECHO which revealed LV EF of 50-55%  08/22/18 PET/CT revealedProgression, since 13/24/4010, of hypermetabolic abdominopelvic adenopathy, consistent with active lymphoma. 2. CT occult hypermetabolic osseous foci, also most consistent with active lymphoma. 3. No evidence of soft tissue disease above the diaphragm. 4. Focus of hypermetabolism within the anus is likely physiologic. Consider physical exam correlation. 5. Aortic Atherosclerosis.  3. RLE swelling r/o DVT. Could be from lymphedema related to her new/recurrent NHL 1/21/10US Venous RLE to rule out DVT - reviewed - no VTE  4. Hypokalemia resolved K 4.6  5. Pancytopenia due to chemotherapy and DLBCL  6. 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 No evidence of bleeding hgb stable at 8.8  7. DVT Px - lovenox [place]   8.  Hypocalcemia  9.  Hypomagnesemia PLAN: -Discussed pt labs today, 09/23/2018 that showed: 09/23/2018 show CBC w/diff with RBC at 3.13, Hgb at 8.8, PLT at 438K.  -Hypocalcemia -will replace with IV calcium and oral calcium replacement.  Check vitamin D levels and replace aggressively to maintain 25-hydroxy vitamin D levels more than 60. -IV and oral magnesium replacement. -There are no prohibitive toxicities to keep the patient from proceeding with C2 EPOCH-R today at this time.  -Emphasized the importance of eating well and increasing movement throughout treatments.  Discussed that the patient could use a humidifier, saline spray to use to the nasal areas.  -Will continue watching HGB and will support with PRBC transfusion if necessary for hgb<8.. Pt denies light headedness, fatigue, and dizziness at this time.   -Monitor electrolytes closely -Miralax and Senna S for vincristine associated constipation with previous treatment. -We will hold off on allopurinol at this time -Will evaluate pt with PET/CT after C2 or C3 for evaluation  of treatment efficacy.   Outpatient neulasta and Rituxan on 09/30/2018 RTC with Dr Irene Limbo with labs in 2 weeks   All of the patients questions were answered with apparent satisfaction. The patient knows to call the clinic with any problems, questions or  concerns.   Sullivan Lone MD MS AAHIVMS [facility] Hematology/Oncology Physician [facility]  (Office):       [phone] (Work cell):  [phone] (Fax):           [phone]  09/23/2018 12:15 PM  I, Soijett Blue am acting as scribe for Dr. Sullivan Lone.  .I have reviewed the above documentation for accuracy and completeness, and I agree with the above. Sullivan Lone MD MS

## 2018-09-23 NOTE — Progress Notes (Signed)
CRITICAL VALUE ALERT  Critical Value:  Calcium 5.7  Date & Time Notied:  09/23/18, 1230  Provider Notified: Dr Irene Limbo  Orders Received/Actions taken: yes

## 2018-09-23 NOTE — Progress Notes (Signed)
Calculations and dosage of Doxorubicn, etoposide and vincristine verified with Nancy Marus , RN.

## 2018-09-23 NOTE — Progress Notes (Signed)
Spoke w/ Dr. Irene Limbo today. Same dose reductions from cycle 1 of EPOCH-R will continue for cycle 2. Repeat labs ordered for today but may start treatment today while those are being completed.   Demetrius Charity, PharmD, [facility] Pharmacist Pharmacy Phone: [phone] 09/23/2018

## 2018-09-24 DIAGNOSIS — D649 Anemia, unspecified: Secondary | ICD-10-CM

## 2018-09-24 LAB — BASIC METABOLIC PANEL
Anion gap: 7 (ref 5–15)
BUN: 13 mg/dL (ref 8–23)
CO2: 24 mmol/L (ref 22–32)
CREATININE: 0.7 mg/dL (ref 0.44–1.00)
Calcium: 8.1 mg/dL — ABNORMAL LOW (ref 8.9–10.3)
Chloride: 112 mmol/L — ABNORMAL HIGH (ref 98–111)
GFR calc Af Amer: >60 mL/min (ref >60)
GFR calc non Af Amer: >60 mL/min (ref >60)
Glucose, Bld: 122 mg/dL — ABNORMAL HIGH (ref 70–99)
Potassium: 3.3 mmol/L — ABNORMAL LOW (ref 3.5–5.1)
Sodium: 143 mmol/L (ref 135–145)

## 2018-09-24 LAB — CBC
HCT: 25 % — ABNORMAL LOW (ref 36.0–46.0)
Hemoglobin: 7.5 g/dL — ABNORMAL LOW (ref 12.0–15.0)
MCH: 27.6 pg (ref 26.0–34.0)
MCHC: 30 g/dL (ref 30.0–36.0)
MCV: 91.9 fL (ref 80.0–100.0)
Platelets: 364 10*3/uL (ref 150–400)
RBC: 2.72 MIL/uL — ABNORMAL LOW (ref 3.87–5.11)
RDW: 14.5 % (ref 11.5–15.5)
WBC: 10.3 10*3/uL (ref 4.0–10.5)
nRBC: 0 % (ref 0.0–0.2)

## 2018-09-24 LAB — VITAMIN D 25 HYDROXY (VIT D DEFICIENCY, FRACTURES): Vit D, 25-Hydroxy: 32.6 ng/mL (ref 30.0–100.0)

## 2018-09-24 LAB — MAGNESIUM: Magnesium: 2 mg/dL (ref 1.7–2.4)

## 2018-09-24 LAB — PREPARE RBC (CROSSMATCH)

## 2018-09-24 MED ORDER — HEPARIN SOD (PORK) LOCK FLUSH 100 UNIT/ML IV SOLN
250.0000 [IU] | INTRAVENOUS | Status: DC | PRN
  Filled 2018-09-24 (×2): qty 5

## 2018-09-24 MED ORDER — ACETAMINOPHEN 325 MG PO TABS
650.0000 mg | ORAL_TABLET | Freq: Once | ORAL | Status: AC
  Administered 2018-09-24: 650 mg via ORAL
  Filled 2018-09-24: qty 2

## 2018-09-24 MED ORDER — VINCRISTINE SULFATE CHEMO INJECTION 1 MG/ML
Freq: Once | INTRAVENOUS | Status: AC
  Administered 2018-09-24: 14:00:00 via INTRAVENOUS
  Filled 2018-09-24: qty 6

## 2018-09-24 MED ORDER — SODIUM CHLORIDE 0.9 % IV SOLN
Freq: Once | INTRAVENOUS | Status: AC
  Administered 2018-09-24: 18 mg via INTRAVENOUS
  Filled 2018-09-24: qty 4

## 2018-09-24 MED ORDER — PANTOPRAZOLE SODIUM 40 MG PO TBEC
40.0000 mg | DELAYED_RELEASE_TABLET | Freq: Every day | ORAL | Status: DC
  Administered 2018-09-24 – 2018-09-26 (×3): 40 mg via ORAL
  Filled 2018-09-24 (×4): qty 1

## 2018-09-24 MED ORDER — HEPARIN SOD (PORK) LOCK FLUSH 100 UNIT/ML IV SOLN: 500.0000 [IU] | Freq: Every day | INTRAVENOUS | Status: DC | PRN

## 2018-09-24 MED ORDER — SODIUM CHLORIDE 0.9% FLUSH: 10.0000 mL | INTRAVENOUS | Status: DC | PRN

## 2018-09-24 MED ORDER — SODIUM CHLORIDE 0.9% FLUSH: 3.0000 mL | INTRAVENOUS | Status: DC | PRN

## 2018-09-24 MED ORDER — SODIUM CHLORIDE 0.9% IV SOLUTION
250.0000 mL | Freq: Once | INTRAVENOUS | Status: AC
  Administered 2018-09-24: 250 mL via INTRAVENOUS

## 2018-09-24 MED ORDER — POTASSIUM CHLORIDE CRYS ER 20 MEQ PO TBCR: 20.0000 meq | EXTENDED_RELEASE_TABLET | Freq: Two times a day (BID) | ORAL | Status: DC

## 2018-09-24 MED ORDER — POTASSIUM CHLORIDE CRYS ER 20 MEQ PO TBCR
40.0000 meq | EXTENDED_RELEASE_TABLET | Freq: Two times a day (BID) | ORAL | Status: DC
  Administered 2018-09-24 – 2018-09-26 (×4): 40 meq via ORAL
  Filled 2018-09-24 (×4): qty 2

## 2018-09-24 MED ORDER — DIPHENHYDRAMINE HCL 25 MG PO CAPS
25.0000 mg | ORAL_CAPSULE | Freq: Once | ORAL | Status: AC
  Administered 2018-09-24: 25 mg via ORAL
  Filled 2018-09-24: qty 1

## 2018-09-24 NOTE — Progress Notes (Signed)
MD ok to treat pt today despite Hgb = 7.5 g/dL.  Pt will be transfused. Kennith Center, Pharm.D., CPP 09/24/2018@9 :02 AM

## 2018-09-24 NOTE — Progress Notes (Signed)
HEMATOLOGY/ONCOLOGY INPATIENT PROGRESS NOTE  Date of Service: 09/24/2018  Inpatient Attending: .Brunetta Genera, MD   SUBJECTIVE:   Taylor Huynh reports that she is doing well overall today.   The pt reports that she has been feeling like herself today.She denies light headedness or dizziness. The pt denies mouth sores. She notes that she has been able to move her bowels since beginning laxatives. She has been trying to eat well.  Lab results today (09/24/18) of CBC w/diff and CMP is as follows: all values are WNL except for RBC at 2.72, HGB at 7.5, HCT at 25.0, Potassium at 3.3, Chloride at 112, Glucose at 122, Calcium at 8.1.Magnesium 2  On review of systems, pt reports eating well, moving her bowels well, and denies mouth sores, light headedness, dizziness, abdominal pains, leg swelling, and any other symptoms.   OBJECTIVE:  NAD  PHYSICAL EXAMINATION: . Vitals:   09/23/18 1234 09/23/18 2057 09/24/18 0537 09/24/18 1339  BP: 130/76 114/61 124/78 (!) 160/80  Pulse: 92 87 85 92  Resp: _0 Temp: 98 F (36.7 C) 98.5 F (36.9 C) 97.7 F (36.5 C) 98.4 F (36.9 C)  TempSrc: Oral Oral Oral Oral  SpO2: 97% 94% 98% 100%  Weight:      Height:       Filed Weights   09/23/18 0932  Weight: 176 lb 3.2 oz (79.9 kg)   .Body mass index is 30.24 kg/m.  GENERAL:alert, in no acute distress and comfortable SKIN: no acute rashes, no significant lesions EYES: conjunctiva are pink and non-injected, sclera anicteric OROPHARYNX: MMM, no exudates, no oropharyngeal erythema or ulceration NECK: supple, no JVD LYMPH:  no palpable lymphadenopathy in the cervical, axillary or inguinal regions LUNGS: clear to auscultation b/l with normal respiratory effort HEART: regular rate & rhythm ABDOMEN:  normoactive bowel sounds , non tender, not distended. No palpable hepatosplenomegaly.  Extremity: no pedal edema PSYCH: alert & oriented x 3 with fluent speech NEURO: no focal  motor/sensory deficits   MEDICAL HISTORY:  Past Medical History:  Diagnosis Date  . GERD (gastroesophageal reflux disease)   . HH (hiatus hernia) 12/12/2011  . Hypertension   . Leukopenia 12/12/2011  . nhl dx'd 2010  . Seizures ([place]) reports "years ago"  . Syncope 12/12/2011   Hx seizures - not documented - age 43; recent syncope 6/12 and x2, 12/12; negative MRI brain 1/13    SURGICAL HISTORY: Past Surgical History:  Procedure Laterality Date  . COLON SURGERY    . IR IMAGING GUIDED PORT INSERTION  08/23/2018    SOCIAL HISTORY: Social History   Socioeconomic History  . Marital status: Married    Spouse name: Not on file  . Number of children: Not on file  . Years of education: Not on file  . Highest education level: Not on file  Occupational History  . Not on file  Social Needs  . Financial resource strain: Not on file  . Food insecurity:    Worry: Not on file    Inability: Not on file  . Transportation needs:    Medical: Not on file    Non-medical: Not on file  Tobacco Use  . Smoking status: Never Smoker  . Smokeless tobacco: Never Used  Substance and Sexual Activity  . Alcohol use: No  . Drug use: No  . Sexual activity: Not on file  Lifestyle  . Physical activity:    Days per week: Not on file    Minutes  per session: Not on file  . Stress: Not on file  Relationships  . Social connections:    Talks on phone: Not on file    Gets together: Not on file    Attends religious service: Not on file    Active member of club or organization: Not on file    Attends meetings of clubs or organizations: Not on file    Relationship status: Not on file  . Intimate partner violence:    Fear of current or ex partner: Not on file    Emotionally abused: Not on file    Physically abused: Not on file    Forced sexual activity: Not on file  Other Topics Concern  . Not on file  Social History Narrative  . Not on file    FAMILY HISTORY: History reviewed. No pertinent  family history.  ALLERGIES:  has No Known Allergies.  MEDICATIONS:  Scheduled Meds: . amLODipine  10 mg Oral Daily  . calcium-vitamin D  2 tablet Oral BID  . DOXOrubicin/vinCRIStine/etoposide CHEMO IV infusion for Inpatient CI   Intravenous Once  . enoxaparin (LOVENOX) injection  40 mg Subcutaneous Q24H  . furosemide  20 mg Oral Daily  . magnesium oxide  400 mg Oral BID  . pantoprazole  40 mg Oral Daily  . polyethylene glycol  17 g Oral Daily  . potassium chloride SA  20 mEq Oral BID  . predniSONE  60 mg Oral QAC breakfast  . senna-docusate  2 tablet Oral QHS  . sodium bicarbonate/sodium chloride   Mouth Rinse QID   Continuous Infusions: . sodium chloride 20 mL/hr at 09/24/18 0610   PRN Meds:.Cold Pack, Hot Pack  REVIEW OF SYSTEMS:    A 10+ POINT REVIEW OF SYSTEMS WAS OBTAINED including neurology, dermatology, psychiatry, cardiac, respiratory, lymph, extremities, GI, GU, Musculoskeletal, constitutional, breasts, reproductive, HEENT.  All pertinent positives are noted in the HPI.  All others are negative.   LABORATORY DATA:  I have reviewed the data as listed  . CBC Latest Ref Rng & Units 09/24/2018 09/23/2018 09/16/2018  WBC 4.0 - 10.5 K/uL 10.3 8.7 12.0(H)  Hemoglobin 12.0 - 15.0 g/dL 7.5(L) 8.8(L) 8.6(L)  Hematocrit 36.0 - 46.0 % 25.0(L) 28.3(L) 28.0(L)  Platelets 150 - 400 K/uL 364 438(H) 217    . CMP Latest Ref Rng & Units 09/24/2018 09/23/2018 09/16/2018  Glucose 70 - 99 mg/dL 122(H) 107(H) 111(H)  BUN 8 - 23 mg/dL _0 Creatinine 0.44 - 1.00 mg/dL 0.70 0.85 1.18(H)  Sodium 135 - 145 mmol/L 143 141 141  Potassium 3.5 - 5.1 mmol/L 3.3(L) 4.6 3.3(L)  Chloride 98 - 111 mmol/L 112(H) 106 105  CO2 22 - 32 mmol/L _1 Calcium 8.9 - 10.3 mg/dL 8.1(L) 5.7(LL) 8.4(L)  Total Protein 6.5 - 8.1 g/dL - 6.7 6.5  Total Bilirubin 0.3 - 1.2 mg/dL - 0.5 <0.2(L)  Alkaline Phos 38 - 126 U/L - 73 92  AST 15 - 41 U/L - 14(L) 16  ALT 0 - 44 U/L - 13 12   Component     Latest Ref  Rng & Units 09/23/2018 09/24/2018  Magnesium     1.7 - 2.4 mg/dL 1.2 (L) 2.0  Phosphorus     2.5 - 4.6 mg/dL 2.6   Uric Acid, Serum     2.5 - 7.1 mg/dL 5.1   Vitamin D, 25-Hydroxy     30.0 - 100.0 ng/mL 32.6     RADIOGRAPHIC STUDIES: I have personally  reviewed the radiological images as listed and agreed with the findings in the report. No results found.  ASSESSMENT & PLAN:   78 y.o. female with  1. 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 2. Tiny hiatal hernia. 3. Pelvic floor laxity. Aortic Atherosclerosis.  Labs upon initial presentation from1/14/20, WBC normal at 4.3k, HGB at 10.6, PLT normal at 201k  08/06/18 Right Inguinal LN Biopsy which revealed Large B-Cell Lymphoma with a Ki67 of 90%. FISH is negative for rearrangements of BCL2, BCL6, and MYC. 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  08/13/18 Hep B and Hep C negative  08/21/18 ECHO which revealed LV EF of 50-55%  08/22/18 PET/CT revealedProgression, since 66/12/3014, of hypermetabolic abdominopelvic adenopathy, consistent with active lymphoma. 2. CT occult hypermetabolic osseous foci, also most consistent with active lymphoma. 3. No evidence of soft tissue disease above the diaphragm. 4. Focus of hypermetabolism within the anus is likely physiologic.  Consider physical exam correlation. 5. Aortic Atherosclerosis.  3. RLE swelling r/o DVT. Could be from lymphedema related to her new/recurrent NHL 1/21/10US Venous RLE to rule out DVT - reviewed - no VTE  4. Hypokalemia resolved K 3.3  5. Pancytopenia due to chemotherapy and DLBCL  6. 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  No evidence of bleeding hgb stable at 7.5  7. DVT Px - lovenox [place]   8.  Hypocalcemia improved from 5.7 to 8.1  9.  Hypomagnesemia resolved with replacement and up from 1.2 to 2 PLAN: -Discussed pt labwork today, 09/24/18; magnesium and calcium have normalized. HGB at 7.5. Potassium at 3.3. -Will order 1 unit of PRBC -informed consent obtained -PO Potassium replacement -The pt has no prohibitive toxicities from continuing dose reduced C2D2 EPOCH-R at this time. Dose reduced for age and previous chemotherapy exposure. -Continue eating well, staying hydrated, and staying as active as reasonably possible  -Check vitamin D levels and replace aggressively to maintain 25-hydroxy vitamin D levels more than 60. -Emphasized the importance of eating well and increasing movement throughout treatments.  Discussed that the patient could use a humidifier, saline spray to use to the nasal areas.  -Will continue watching HGB and will support with PRBC transfusion if necessary for hgb<8. -Monitor electrolytes closely -Miralax and Senna S for vincristine associated constipation with previous treatment.  -Will evaluate pt with PET/CT after C2 or C3 for evaluation of treatment efficacy. -Outpatient neulasta and Rituxan on 09/30/2018 -RTC with Dr Irene Limbo with labs in 2 weeks    The total time spent in the appt was 35 minutes and more than 50% was on counseling and direct patient cares.    Sullivan Lone MD MS AAHIVMS [facility] Hematology/Oncology Physician [facility]  (Office):       [phone] (Work cell):  [phone] (Fax):            [phone]  09/24/2018 4:18 PM   I, Baldwin [place], am acting as a scribe for Dr.  Sullivan Lone.   .I have reviewed the above documentation for accuracy and completeness, and I agree with the above. Sullivan Lone MD MS

## 2018-09-25 LAB — CBC
HEMATOCRIT: 28.9 % — AB (ref 36.0–46.0)
Hemoglobin: 8.7 g/dL — ABNORMAL LOW (ref 12.0–15.0)
MCH: 27.2 pg (ref 26.0–34.0)
MCHC: 30.1 g/dL (ref 30.0–36.0)
MCV: 90.3 fL (ref 80.0–100.0)
Platelets: 389 10*3/uL (ref 150–400)
RBC: 3.2 MIL/uL — ABNORMAL LOW (ref 3.87–5.11)
RDW: 15 % (ref 11.5–15.5)
WBC: 11.4 10*3/uL — ABNORMAL HIGH (ref 4.0–10.5)
nRBC: 0 % (ref 0.0–0.2)

## 2018-09-25 LAB — BASIC METABOLIC PANEL
Anion gap: 6 (ref 5–15)
BUN: 17 mg/dL (ref 8–23)
CHLORIDE: 112 mmol/L — AB (ref 98–111)
CO2: 25 mmol/L (ref 22–32)
CREATININE: 0.67 mg/dL (ref 0.44–1.00)
Calcium: 8.6 mg/dL — ABNORMAL LOW (ref 8.9–10.3)
GFR calc Af Amer: >60 mL/min (ref >60)
GFR calc non Af Amer: >60 mL/min (ref >60)
Glucose, Bld: 148 mg/dL — ABNORMAL HIGH (ref 70–99)
Potassium: 4 mmol/L (ref 3.5–5.1)
Sodium: 143 mmol/L (ref 135–145)

## 2018-09-25 LAB — TYPE AND SCREEN
ABO/RH(D): A POS
Antibody Screen: NEGATIVE
Unit division: 0

## 2018-09-25 LAB — BPAM RBC
Blood Product Expiration Date: 202003312359
ISSUE DATE / TIME: 202003032253
Unit Type and Rh: 6200

## 2018-09-25 LAB — CALCIUM, IONIZED: Calcium, Ionized, Serum: 4.9 mg/dL (ref 4.5–5.6)

## 2018-09-25 MED ORDER — VINCRISTINE SULFATE CHEMO INJECTION 1 MG/ML
Freq: Once | INTRAVENOUS | Status: AC
  Administered 2018-09-25: 11:00:00 via INTRAVENOUS
  Filled 2018-09-25: qty 6

## 2018-09-25 MED ORDER — SODIUM CHLORIDE 0.9 % IV SOLN
Freq: Once | INTRAVENOUS | Status: AC
  Administered 2018-09-25: 18 mg via INTRAVENOUS
  Filled 2018-09-25: qty 4

## 2018-09-25 NOTE — Progress Notes (Signed)
HEMATOLOGY/ONCOLOGY INPATIENT PROGRESS NOTE  Date of Service: 09/25/2018  Inpatient Attending: .Brunetta Genera, MD   SUBJECTIVE:   Taylor Huynh is accompanied today by her husband at bedside. The pt reports that she is doing well overall.   The pt reports that she is enjoying some improved energy levels. She notes that she is eating well and has been ambulating periodically. She denies any leg swelling or any other new concerns. She is moving her bowels.  Lab results today (09/25/18) of CBC w/diff and CMP is as follows: all values are WNL except for WBC at 11.4k, RBC at 3.20, HGB at 8.7, HCT at 28.9, Chloride at 112, Glucose at 148, Calcium at 8.6.  On review of systems, pt reports improved energy levels, moving her bowels well,  and denies leg swelling, abdominal pains, and any other symptoms.   OBJECTIVE:  NAD  PHYSICAL EXAMINATION: . Vitals:   09/24/18 2316 09/25/18 0119 09/25/18 0656 09/25/18 1357  BP: (!) 141/77 (!) 151/77 (!) 147/75 (!) 168/83  Pulse: 86 88 76 86  Resp: '16 16 16 14  ' Temp: 97.8 F (36.6 C) 97.8 F (36.6 C) 97.8 F (36.6 C) 97.9 F (36.6 C)  TempSrc: Oral Oral Oral Oral  SpO2: 97% 97% 100% 99%  Weight:      Height:       Filed Weights   09/23/18 0932  Weight: 176 lb 3.2 oz (79.9 kg)   .Body mass index is 30.24 kg/m.  GENERAL:alert, in no acute distress and comfortable SKIN: no acute rashes, no significant lesions EYES: conjunctiva are pink and non-injected, sclera anicteric OROPHARYNX: MMM, no exudates, no oropharyngeal erythema or ulceration NECK: supple, no JVD LYMPH:  no palpable lymphadenopathy in the cervical, axillary or inguinal regions LUNGS: clear to auscultation b/l with normal respiratory effort HEART: regular rate & rhythm ABDOMEN:  normoactive bowel sounds , non tender, not distended. No palpable hepatosplenomegaly.  Extremity: no pedal edema PSYCH: alert & oriented x 3 with fluent speech NEURO: no focal  motor/sensory deficits    MEDICAL HISTORY:  Past Medical History:  Diagnosis Date  . GERD (gastroesophageal reflux disease)   . HH (hiatus hernia) 12/12/2011  . Hypertension   . Leukopenia 12/12/2011  . nhl dx'd 2010  . Seizures ([place]) reports "years ago"  . Syncope 12/12/2011   Hx seizures - not documented - age 59; recent syncope 6/12 and x2, 12/12; negative MRI brain 1/13    SURGICAL HISTORY: Past Surgical History:  Procedure Laterality Date  . COLON SURGERY    . IR IMAGING GUIDED PORT INSERTION  08/23/2018    SOCIAL HISTORY: Social History   Socioeconomic History  . Marital status: Married    Spouse name: Not on file  . Number of children: Not on file  . Years of education: Not on file  . Highest education level: Not on file  Occupational History  . Not on file  Social Needs  . Financial resource strain: Not on file  . Food insecurity:    Worry: Not on file    Inability: Not on file  . Transportation needs:    Medical: Not on file    Non-medical: Not on file  Tobacco Use  . Smoking status: Never Smoker  . Smokeless tobacco: Never Used  Substance and Sexual Activity  . Alcohol use: No  . Drug use: No  . Sexual activity: Not on file  Lifestyle  . Physical activity:    Days per week: Not on  file    Minutes per session: Not on file  . Stress: Not on file  Relationships  . Social connections:    Talks on phone: Not on file    Gets together: Not on file    Attends religious service: Not on file    Active member of club or organization: Not on file    Attends meetings of clubs or organizations: Not on file    Relationship status: Not on file  . Intimate partner violence:    Fear of current or ex partner: Not on file    Emotionally abused: Not on file    Physically abused: Not on file    Forced sexual activity: Not on file  Other Topics Concern  . Not on file  Social History Narrative  . Not on file    FAMILY HISTORY: History reviewed. No pertinent  family history.  ALLERGIES:  has No Known Allergies.  MEDICATIONS:  Scheduled Meds: . amLODipine  10 mg Oral Daily  . calcium-vitamin D  2 tablet Oral BID  . DOXOrubicin/vinCRIStine/etoposide CHEMO IV infusion for Inpatient CI   Intravenous Once  . enoxaparin (LOVENOX) injection  40 mg Subcutaneous Q24H  . furosemide  20 mg Oral Daily  . magnesium oxide  400 mg Oral BID  . pantoprazole  40 mg Oral Daily  . polyethylene glycol  17 g Oral Daily  . potassium chloride SA  40 mEq Oral BID  . predniSONE  60 mg Oral QAC breakfast  . senna-docusate  2 tablet Oral QHS  . sodium bicarbonate/sodium chloride   Mouth Rinse QID   Continuous Infusions: . sodium chloride 20 mL/hr at 09/25/18 0404   PRN Meds:.Cold Pack, heparin lock flush, heparin lock flush, Hot Pack, sodium chloride flush, sodium chloride flush  REVIEW OF SYSTEMS:    A 10+ POINT REVIEW OF SYSTEMS WAS OBTAINED including neurology, dermatology, psychiatry, cardiac, respiratory, lymph, extremities, GI, GU, Musculoskeletal, constitutional, breasts, reproductive, HEENT.  All pertinent positives are noted in the HPI.  All others are negative.   LABORATORY DATA:  I have reviewed the data as listed  . CBC Latest Ref Rng & Units 09/25/2018 09/24/2018 09/23/2018  WBC 4.0 - 10.5 K/uL 11.4(H) 10.3 8.7  Hemoglobin 12.0 - 15.0 g/dL 8.7(L) 7.5(L) 8.8(L)  Hematocrit 36.0 - 46.0 % 28.9(L) 25.0(L) 28.3(L)  Platelets 150 - 400 K/uL 389 364 438(H)    . CMP Latest Ref Rng & Units 09/25/2018 09/24/2018 09/23/2018  Glucose 70 - 99 mg/dL 148(H) 122(H) 107(H)  BUN 8 - 23 mg/dL '17 13 14  ' Creatinine 0.44 - 1.00 mg/dL 0.67 0.70 0.85  Sodium 135 - 145 mmol/L 143 143 141  Potassium 3.5 - 5.1 mmol/L 4.0 3.3(L) 4.6  Chloride 98 - 111 mmol/L 112(H) 112(H) 106  CO2 22 - 32 mmol/L '25 24 26  ' Calcium 8.9 - 10.3 mg/dL 8.6(L) 8.1(L) 5.7(LL)  Total Protein 6.5 - 8.1 g/dL - - 6.7  Total Bilirubin 0.3 - 1.2 mg/dL - - 0.5  Alkaline Phos 38 - 126 U/L - - 73  AST 15 -  41 U/L - - 14(L)  ALT 0 - 44 U/L - - 13   Component     Latest Ref Rng & Units 09/23/2018 09/24/2018  Magnesium     1.7 - 2.4 mg/dL 1.2 (L) 2.0  Phosphorus     2.5 - 4.6 mg/dL 2.6   Uric Acid, Serum     2.5 - 7.1 mg/dL 5.1   Vitamin D, 25-Hydroxy  30.0 - 100.0 ng/mL 32.6     RADIOGRAPHIC STUDIES: I have personally reviewed the radiological images as listed and agreed with the findings in the report. No results found.  ASSESSMENT & PLAN:   78 y.o. female with  1. 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 2. Tiny hiatal hernia. 3. Pelvic floor laxity. Aortic Atherosclerosis.  Labs upon initial presentation from1/14/20, WBC normal at 4.3k, HGB at 10.6, PLT normal at 201k  08/06/18 Right Inguinal LN Biopsy which revealed Large B-Cell Lymphoma with a Ki67 of 90%. FISH is negative for rearrangements of BCL2, BCL6, and MYC. 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  08/13/18 Hep B and Hep C negative  08/21/18 ECHO which revealed LV EF of 50-55%  08/22/18 PET/CT revealedProgression, since 88/41/6606, of hypermetabolic abdominopelvic adenopathy, consistent with active lymphoma. 2. CT occult hypermetabolic osseous foci, also most consistent with active lymphoma. 3. No evidence of soft tissue disease above the diaphragm. 4.  Focus of hypermetabolism within the anus is likely physiologic. Consider physical exam correlation. 5. Aortic Atherosclerosis.  3. RLE swelling r/o DVT. Could be from lymphedema related to her new/recurrent NHL 1/21/10US Venous RLE to rule out DVT - reviewed - no VTE  4. Hypokalemia resolved K 3.3  5. Pancytopenia due to chemotherapy and DLBCL  6. 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  No evidence of bleeding hgb stable at 7.5  7. DVT Px - lovenox [place]   8.  Hypocalcemia improved from 5.7 to 8.1 to 8.6  9.  Hypomagnesemia resolved with replacement and up from 1.2 to 2  PLAN: -Discussed pt labwork today, 09/25/18; HGB improved to 8.7, other blood counts and chemistries are stable -The pt has no prohibitive toxicities from continuing C2D3 dose reduced EPOCH-R at this time. Dose reduced for age and previous chemotherapy exposure. -Continue eating well, staying hydrated, and staying as active as reasonably possible  -PO Potassium replacement -Check vitamin D levels and replace aggressively to maintain 25-hydroxy vitamin D levels more than 60. -Emphasized the importance of eating well and increasing movement throughout treatments.  -Discussed that the patient could use a humidifier, saline spray to use to the nasal areas.  -Will continue watching HGB and will support with PRBC transfusion if necessary for hgb<8. -Monitor electrolytes closely -Miralax and Senna S for vincristine associated constipation with previous treatment. -Will evaluate pt with PET/CT after C2 or C3 for evaluation of treatment efficacy. -Outpatient neulasta and Rituxan on 09/30/2018 -Will see the pt in clinic on 10/07/18   The total time spent in the appt was 25 minutes and more than 50% was on counseling and direct patient cares.    Sullivan Lone MD MS AAHIVMS [facility] Hematology/Oncology Physician [facility]  (Office):       [phone] (Work cell):  [phone] (Fax):            [phone]  09/25/2018 4:43 PM   I, Baldwin [place], am acting  as a scribe for Dr. Sullivan Lone.   .I have reviewed the above documentation for accuracy and completeness, and I agree with the above. Sullivan Lone MD MS

## 2018-09-25 NOTE — Progress Notes (Signed)
MD agreed pt ok for rapid inf RTX.  Orders updated. Kennith Center, Pharm.D., CPP 09/25/2018@2 :15 PM

## 2018-09-26 LAB — BASIC METABOLIC PANEL
Anion gap: 3 — ABNORMAL LOW (ref 5–15)
BUN: 18 mg/dL (ref 8–23)
CO2: 26 mmol/L (ref 22–32)
Calcium: 8.9 mg/dL (ref 8.9–10.3)
Chloride: 113 mmol/L — ABNORMAL HIGH (ref 98–111)
Creatinine, Ser: 0.82 mg/dL (ref 0.44–1.00)
GFR calc Af Amer: >60 mL/min (ref >60)
GFR calc non Af Amer: >60 mL/min (ref >60)
Glucose, Bld: 116 mg/dL — ABNORMAL HIGH (ref 70–99)
Potassium: 4.3 mmol/L (ref 3.5–5.1)
Sodium: 142 mmol/L (ref 135–145)

## 2018-09-26 LAB — CBC
HEMATOCRIT: 28.5 % — AB (ref 36.0–46.0)
Hemoglobin: 8.5 g/dL — ABNORMAL LOW (ref 12.0–15.0)
MCH: 27 pg (ref 26.0–34.0)
MCHC: 29.8 g/dL — ABNORMAL LOW (ref 30.0–36.0)
MCV: 90.5 fL (ref 80.0–100.0)
Platelets: 373 10*3/uL (ref 150–400)
RBC: 3.15 MIL/uL — ABNORMAL LOW (ref 3.87–5.11)
RDW: 15.2 % (ref 11.5–15.5)
WBC: 6.3 10*3/uL (ref 4.0–10.5)
nRBC: 0 % (ref 0.0–0.2)

## 2018-09-26 MED ORDER — VINCRISTINE SULFATE CHEMO INJECTION 1 MG/ML
Freq: Once | INTRAVENOUS | Status: AC
  Administered 2018-09-26: 12:00:00 via INTRAVENOUS
  Filled 2018-09-26: qty 6

## 2018-09-26 MED ORDER — SODIUM CHLORIDE 0.9 % IV SOLN
Freq: Once | INTRAVENOUS | Status: AC
  Administered 2018-09-26: 8 mg via INTRAVENOUS
  Filled 2018-09-26: qty 4

## 2018-09-26 NOTE — Care Management Important Message (Signed)
Important Message  Patient Details  Name: Taylor Huynh MRN: [id number] Date of Birth: [birth year]   Medicare Important Message Given:  Yes    Shallon Yaklin 09/26/2018, 9:13 AM

## 2018-09-26 NOTE — Progress Notes (Addendum)
HEMATOLOGY/ONCOLOGY INPATIENT PROGRESS NOTE  Date of Service: 09/26/2018  Inpatient Attending: .Brunetta Genera, MD   SUBJECTIVE:   Taylor Huynh is sitting in the recliner chair this morning. No family at bedside. The pt reports that she is doing well overall.   The pt reports that she is enjoying some improved energy levels. She notes that she is eating well and has been ambulating periodically. She denies any leg swelling or any other new concerns. She is moving her bowels.  Lab results today (09/26/18) of CBC w/diff and CMP is as follows: all values are WNL except for RBC at 3.15, HGB at 8.5, HCT at 28.5, MCHC 29.8Chloride at 113, Glucose at 116.  On review of systems, pt reports improved energy levels, moving her bowels well,  and denies leg swelling, abdominal pains, and any other symptoms.   OBJECTIVE:  NAD  PHYSICAL EXAMINATION: . Vitals:   09/25/18 0656 09/25/18 1357 09/25/18 2107 09/26/18 0642  BP: (!) 147/75 (!) 168/83 (!) 122/58 (!) 154/71  Pulse: 76 86 76 61  Resp: '16 14 16 20  ' Temp: 97.8 F (36.6 C) 97.9 F (36.6 C) 98.4 F (36.9 C) 98.5 F (36.9 C)  TempSrc: Oral Oral Oral Oral  SpO2: 100% 99% 99% 100%  Weight:      Height:       Filed Weights   09/23/18 0932  Weight: 176 lb 3.2 oz (79.9 kg)   .Body mass index is 30.24 kg/m.  GENERAL:alert, in no acute distress and comfortable SKIN: no acute rashes, no significant lesions EYES: conjunctiva are pink and non-injected, sclera anicteric OROPHARYNX: MMM, no exudate, no oropharyngeal erythema or ulceration NECK: supple, no JVD LYMPH:  no palpable lymphadenopathy in the cervical, axillary or inguinal regions LUNGS: clear to auscultation b/l with normal respiratory effort HEART: regular rate & rhythm ABDOMEN:  normoactive bowel sounds , non tender, not distended. No palpable hepatosplenomegaly.  Extremity: no pedal edema PSYCH: alert & oriented x 3 with fluent speech NEURO: no focal  motor/sensory deficits    MEDICAL HISTORY:  Past Medical History:  Diagnosis Date  . GERD (gastroesophageal reflux disease)   . HH (hiatus hernia) 12/12/2011  . Hypertension   . Leukopenia 12/12/2011  . nhl dx'd 2010  . Seizures ([place]) reports "years ago"  . Syncope 12/12/2011   Hx seizures - not documented - age 75; recent syncope 6/12 and x2, 12/12; negative MRI brain 1/13    SURGICAL HISTORY: Past Surgical History:  Procedure Laterality Date  . COLON SURGERY    . IR IMAGING GUIDED PORT INSERTION  08/23/2018    SOCIAL HISTORY: Social History   Socioeconomic History  . Marital status: Married    Spouse name: Not on file  . Number of children: Not on file  . Years of education: Not on file  . Highest education level: Not on file  Occupational History  . Not on file  Social Needs  . Financial resource strain: Not on file  . Food insecurity:    Worry: Not on file    Inability: Not on file  . Transportation needs:    Medical: Not on file    Non-medical: Not on file  Tobacco Use  . Smoking status: Never Smoker  . Smokeless tobacco: Never Used  Substance and Sexual Activity  . Alcohol use: No  . Drug use: No  . Sexual activity: Not on file  Lifestyle  . Physical activity:    Days per week: Not on file  Minutes per session: Not on file  . Stress: Not on file  Relationships  . Social connections:    Talks on phone: Not on file    Gets together: Not on file    Attends religious service: Not on file    Active member of club or organization: Not on file    Attends meetings of clubs or organizations: Not on file    Relationship status: Not on file  . Intimate partner violence:    Fear of current or ex partner: Not on file    Emotionally abused: Not on file    Physically abused: Not on file    Forced sexual activity: Not on file  Other Topics Concern  . Not on file  Social History Narrative  . Not on file    FAMILY HISTORY: History reviewed. No pertinent  family history.  ALLERGIES:  has No Known Allergies.  MEDICATIONS:  Scheduled Meds: . amLODipine  10 mg Oral Daily  . calcium-vitamin D  2 tablet Oral BID  . DOXOrubicin/vinCRIStine/etoposide CHEMO IV infusion for Inpatient CI   Intravenous Once  . enoxaparin (LOVENOX) injection  40 mg Subcutaneous Q24H  . furosemide  20 mg Oral Daily  . magnesium oxide  400 mg Oral BID  . pantoprazole  40 mg Oral Daily  . polyethylene glycol  17 g Oral Daily  . potassium chloride SA  40 mEq Oral BID  . predniSONE  60 mg Oral QAC breakfast  . senna-docusate  2 tablet Oral QHS  . sodium bicarbonate/sodium chloride   Mouth Rinse QID   Continuous Infusions: . sodium chloride 20 mL/hr at 09/25/18 0404  . ondansetron (ZOFRAN) with dexamethasone (DECADRON) IV     PRN Meds:.Cold Pack, heparin lock flush, heparin lock flush, Hot Pack, sodium chloride flush, sodium chloride flush  REVIEW OF SYSTEMS:    A 10+ POINT REVIEW OF SYSTEMS WAS OBTAINED including neurology, dermatology, psychiatry, cardiac, respiratory, lymph, extremities, GI, GU, Musculoskeletal, constitutional, breasts, reproductive, HEENT.  All pertinent positives are noted in the HPI.  All others are negative.   LABORATORY DATA:  I have reviewed the data as listed  . CBC Latest Ref Rng & Units 09/26/2018 09/25/2018 09/24/2018  WBC 4.0 - 10.5 K/uL 6.3 11.4(H) 10.3  Hemoglobin 12.0 - 15.0 g/dL 8.5(L) 8.7(L) 7.5(L)  Hematocrit 36.0 - 46.0 % 28.5(L) 28.9(L) 25.0(L)  Platelets 150 - 400 K/uL 373 389 364    . CMP Latest Ref Rng & Units 09/26/2018 09/25/2018 09/24/2018  Glucose 70 - 99 mg/dL 116(H) 148(H) 122(H)  BUN 8 - 23 mg/dL '18 17 13  ' Creatinine 0.44 - 1.00 mg/dL 0.82 0.67 0.70  Sodium 135 - 145 mmol/L 142 143 143  Potassium 3.5 - 5.1 mmol/L 4.3 4.0 3.3(L)  Chloride 98 - 111 mmol/L 113(H) 112(H) 112(H)  CO2 22 - 32 mmol/L '26 25 24  ' Calcium 8.9 - 10.3 mg/dL 8.9 8.6(L) 8.1(L)  Total Protein 6.5 - 8.1 g/dL - - -  Total Bilirubin 0.3 - 1.2 mg/dL -  - -  Alkaline Phos 38 - 126 U/L - - -  AST 15 - 41 U/L - - -  ALT 0 - 44 U/L - - -   Component     Latest Ref Rng & Units 09/23/2018 09/24/2018  Magnesium     1.7 - 2.4 mg/dL 1.2 (L) 2.0  Phosphorus     2.5 - 4.6 mg/dL 2.6   Uric Acid, Serum     2.5 - 7.1 mg/dL 5.1  Vitamin D, 25-Hydroxy     30.0 - 100.0 ng/mL 32.6     RADIOGRAPHIC STUDIES:  No results found.  ASSESSMENT & PLAN:   78 y.o. female with  1. 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 2. Tiny hiatal hernia. 3. Pelvic floor laxity. Aortic Atherosclerosis.  Labs upon initial presentation from1/14/20, WBC normal at 4.3k, HGB at 10.6, PLT normal at 201k  08/06/18 Right Inguinal LN Biopsy which revealed Large B-Cell Lymphoma with a Ki67 of 90%. FISH is negative for rearrangements of BCL2, BCL6, and MYC. 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  08/13/18 Hep B and Hep C negative  08/21/18 ECHO which revealed LV EF of 50-55%  08/22/18 PET/CT revealedProgression, since 16/94/5038, of hypermetabolic abdominopelvic adenopathy, consistent with active lymphoma. 2. CT occult hypermetabolic osseous foci, also most consistent with active lymphoma. 3. No evidence of soft tissue disease above the diaphragm. 4. Focus of hypermetabolism within the anus is likely  physiologic. Consider physical exam correlation. 5. Aortic Atherosclerosis.  3. RLE swelling r/o DVT. Could be from lymphedema related to her new/recurrent NHL 1/21/10US Venous RLE to rule out DVT - reviewed - no VTE  4. Hypokalemia resolved K 3.3 on admission  5. Pancytopenia due to chemotherapy and DLBCL  6. 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  No evidence of bleeding hgb stable.  7. DVT Px - lovenox [place]   8.  Hypocalcemia improved from 5.7 to 8.1 to 8.6  9.  Hypomagnesemia resolved with replacement and up from 1.2 to 2  PLAN: -Discussed pt labwork today, 09/26/18; HGB stable at 8.5, other blood counts and chemistries are stable -The pt has no prohibitive toxicities from continuing C2D4 dose reduced EPOCH-R at this time. Dose reduced for age and previous chemotherapy exposure. -Continue eating well, staying hydrated, and staying as active as reasonably possible  -PO Potassium replacement discontinued - potassium normalized today -lasix held per patient request. -Check vitamin D levels and replace aggressively to maintain 25-hydroxy vitamin D levels more than 60. -Emphasized the importance of eating well and increasing movement throughout treatments.  -Discussed that the patient could use a humidifier, saline spray to use to the nasal areas.  -Will continue watching HGB and will support with PRBC transfusion if necessary for hgb<8. -Monitor electrolytes closely -Miralax and Senna S for vincristine associated constipation with previous treatment. -Will evaluate pt with PET/CT after C2 or C3 for evaluation of treatment efficacy. -Outpatient neulasta and Rituxan on 09/30/2018 -Will see the pt in clinic on 10/07/18   The total time spent in the appt was 25 minutes and more than 50% was on counseling and direct patient cares.    Mikey Bussing, DNP, AGPCNP-BC, AOCNP   09/26/2018 9:45 AM   ADDENDUM  .Patient was Personally and independently interviewed,  examined and relevant elements of the history of present illness were reviewed in details and an assessment and plan was created. All elements of the patient's history of  present illness , assessment and plan were discussed in details with Mikey Bussing NP. The above documentation reflects our combined findings assessment and plan.  Sullivan Lone MD MS

## 2018-09-27 LAB — CBC
HCT: 28.6 % — ABNORMAL LOW (ref 36.0–46.0)
Hemoglobin: 8.8 g/dL — ABNORMAL LOW (ref 12.0–15.0)
MCH: 27.2 pg (ref 26.0–34.0)
MCHC: 30.8 g/dL (ref 30.0–36.0)
MCV: 88.5 fL (ref 80.0–100.0)
Platelets: 364 10*3/uL (ref 150–400)
RBC: 3.23 MIL/uL — ABNORMAL LOW (ref 3.87–5.11)
RDW: 14.4 % (ref 11.5–15.5)
WBC: 3.5 10*3/uL — AB (ref 4.0–10.5)
nRBC: 0 % (ref 0.0–0.2)

## 2018-09-27 LAB — BASIC METABOLIC PANEL
Anion gap: 6 (ref 5–15)
BUN: 22 mg/dL (ref 8–23)
CHLORIDE: 105 mmol/L (ref 98–111)
CO2: 29 mmol/L (ref 22–32)
Calcium: 8.8 mg/dL — ABNORMAL LOW (ref 8.9–10.3)
Creatinine, Ser: 0.79 mg/dL (ref 0.44–1.00)
GFR calc Af Amer: >60 mL/min (ref >60)
GFR calc non Af Amer: >60 mL/min (ref >60)
Glucose, Bld: 111 mg/dL — ABNORMAL HIGH (ref 70–99)
Potassium: 3.6 mmol/L (ref 3.5–5.1)
Sodium: 140 mmol/L (ref 135–145)

## 2018-09-27 MED ORDER — SODIUM CHLORIDE 0.9% FLUSH: 10.0000 mL | INTRAVENOUS | Status: DC | PRN

## 2018-09-27 MED ORDER — SODIUM CHLORIDE 0.9 % IV SOLN
Freq: Once | INTRAVENOUS | Status: AC
  Administered 2018-09-27: 36 mg via INTRAVENOUS
  Filled 2018-09-27: qty 8

## 2018-09-27 MED ORDER — DIPHENHYDRAMINE HCL 25 MG PO CAPS: 25.0000 mg | ORAL_CAPSULE | ORAL | Status: DC | PRN

## 2018-09-27 MED ORDER — SODIUM CHLORIDE 0.9 % IV SOLN
400.0000 mg/m2 | Freq: Once | INTRAVENOUS | Status: AC
  Administered 2018-09-27: 780 mg via INTRAVENOUS
  Filled 2018-09-27: qty 39

## 2018-09-27 NOTE — Discharge Summary (Addendum)
Physician Discharge Summary  Patient ID: Taylor Huynh MRN: [id number] DOB/AGE: [birth year] 78 y.o.  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 Diffuse large B cell lymphoma ([place])  Allergies as of 09/27/2018   No Known Allergies     Medication List    STOP taking these medications   allopurinol 100 MG tablet Commonly known as:  ZYLOPRIM   furosemide 20 MG tablet Commonly known as:  LASIX   potassium chloride SA 20 MEQ tablet Commonly known as:  K-DUR,KLOR-CON     TAKE these medications   amLODipine 10 MG tablet Commonly known as:  NORVASC Take 10 mg by mouth daily.   cholecalciferol 1000 units tablet Commonly known as:  VITAMIN D Take 1,000 Units by mouth daily.   lidocaine-prilocaine cream Commonly known as:  EMLA Apply 1 application topically as needed. What changed:  reasons to take this   Melatonin 5 MG Tabs Take 5 mg by mouth daily.   polyethylene glycol packet Commonly known as:  MIRALAX / GLYCOLAX Take 17 g by mouth daily.   senna-docusate 8.6-50 MG tablet Commonly known as:  Senokot-S Take 2 tablets by mouth at bedtime as needed for mild constipation.   vitamin B-12 1000 MCG tablet Commonly known as:  CYANOCOBALAMIN Take 1,000 mcg by mouth every other day.       Discharge Instructions    Care order/instruction   Complete by:  As directed    Transfuse Parameters   Complete patient signature process for consent form   Complete by:  As directed    Practitioner attestation of consent   Complete by:  As directed    I, the ordering practitioner, attest that I have discussed with the patient the benefits, risks, side effects, alternatives, likelihood of achieving goals and potential problems during recovery for the procedure listed.   Procedure:  Blood Product(s)   Type and  screen   Complete by:  As directed    [place]      Disposition: Outpatient Neulasta and Rituxan on 09/30/2018  Discharge laboratory values: CBC    Component Value Date/Time   WBC 3.5 (L) 09/27/2018 0452   RBC 3.23 (L) 09/27/2018 0452   HGB 8.8 (L) 09/27/2018 0452   HGB 9.4 (L) 09/09/2018 0803   HGB 12.1 09/22/2013 1222   HCT 28.6 (L) 09/27/2018 0452   HCT 37.3 09/22/2013 1222   PLT 364 09/27/2018 0452   PLT 101 (L) 09/09/2018 0803   PLT 248 09/22/2013 1222   MCV 88.5 09/27/2018 0452   MCV 88.0 09/22/2013 1222   MCH 27.2 09/27/2018 0452   MCHC 30.8 09/27/2018 0452   RDW 14.4 09/27/2018 0452   RDW 13.6 09/22/2013 1222   LYMPHSABS 1.2 09/23/2018 1229   LYMPHSABS 1.4 09/22/2013 1222   MONOABS 0.8 09/23/2018 1229   MONOABS 0.3 09/22/2013 1222   EOSABS 0.0 09/23/2018 1229   EOSABS 0.1 09/22/2013 1222   BASOSABS 0.0 09/23/2018 1229   BASOSABS 0.0 09/22/2013 1222   CMP Latest Ref Rng & Units 09/27/2018 09/26/2018 09/25/2018  Glucose 70 - 99 mg/dL 111(H) 116(H) 148(H)  BUN 8 - 23 mg/dL '22 18 17  ' Creatinine 0.44 - 1.00 mg/dL 0.79 0.82 0.67  Sodium 135 - 145 mmol/L 140 142 143  Potassium 3.5 - 5.1 mmol/L 3.6 4.3 4.0  Chloride 98 - 111 mmol/L 105  113(H) 112(H)  CO2 22 - 32 mmol/L '29 26 25  ' Calcium 8.9 - 10.3 mg/dL 8.8(L) 8.9 8.6(L)  Total Protein 6.5 - 8.1 g/dL - - -  Total Bilirubin 0.3 - 1.2 mg/dL - - -  Alkaline Phos 38 - 126 U/L - - -  AST 15 - 41 U/L - - -  ALT 0 - 44 U/L - - -   Brief H&P: For complete details please refer to admission H and P, but in brief, Taylor D Randlemanis a wonderful 78 y.o.femalewho was admitted today for C2 dose reduced EPOCH-R treatment for her High Grade B-Cell Non-Hodgkin's Lymphoma. She was accompanied by her family members.   She hasn't been taking the lasix due to resolved leg swelling. She notes that food has been tasting better and she has been able to eat more food.  Notes no acute new symptoms or concerns.  She  reports that her right inguinal lymphadenopathy has completely resolved.  Issues during hospitalization 1. 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 Newly Diagnosed Recurrent/New High Grade B-Cell Non-Hodgkin's Lymphoma, Stage IV FISH negative for findings of double hit lymphoma  07/18/18 CT A/P revealed Extensive abdominal-pelvic adenopathy, consistent with recurrent lymphoma. 2. Tiny hiatal hernia. 3. Pelvic floor laxity. Aortic Atherosclerosis.  Labs upon initial presentation from1/14/20, WBC normal at 4.3k, HGB at 10.6, PLT normal at 201k  08/06/18 Right Inguinal LN Biopsy which revealed Large B-Cell Lymphoma with a Ki67 of 90%. FISH is negative for rearrangements of BCL2, BCL6, and MYC. 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  08/13/18 Hep B and Hep C negative  08/21/18 ECHO which revealed LV EF of 50-55%  08/22/18 PET/CT revealedProgression, since 63/89/3734, of hypermetabolic abdominopelvic adenopathy, consistent with active lymphoma. 2. CT occult hypermetabolic osseous foci, also most consistent with active lymphoma. 3. No evidence of soft tissue disease above the diaphragm. 4. Focus of hypermetabolism within the anus is likely physiologic. Consider physical exam correlation. 5. Aortic Atherosclerosis.  3. RLE swelling r/o DVT. Could be from lymphedema related to her new/recurrent NHL 1/21/10US Venous RLE to rule out DVT - reviewed - no VTE  4. Hypokalemia resolved K 3.6 today.  5. Pancytopenia due to chemotherapy and  DLBCL PLAN: -Discussed pt labwork today,09/27/2018;HGB improved to 8.8.  Total WBC is slightly low at 3.5, other counts and chemistries are stable.  -The pt has no prohibitive toxicities from continuingC2D4 dose reduced EPOCH-Rat this time. Dose reduced for age and previous chemotherapy exposure -Continue eating well, staying hydrated, and staying as active as reasonably possible -Will continue watching HGB, and will support with PRBC transfusion if necessary for hgb<8. Pt denies light headedness, fatigue, and dizziness at this time.  -Miralax -Outpatient Neulasta and Rituxan on 09/30/2018 -Will gauge indications for larger doses pending her display of tolerance  -Will evaluate pt with PET/CT after C2 or C3 for evaluation of treatment efficacy.  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 No evidence of bleeding  6. DVT Px - lovenox [place]  BP (!) 159/64 (BP Location: Right Arm)   Pulse 63   Temp 98 F (36.7  C) (Oral)   Resp 18   Ht '5\' 4"'  (1.626 m)   Wt 176 lb 3.2 oz (79.9 kg)   LMP  (LMP Unknown)   SpO2 98%   BMI 30.24 kg/m   GENERAL:alert, in no acute distress and comfortable SKIN: no acute rashes, no significant lesions EYES: conjunctiva are pink and non-injected, sclera anicteric OROPHARYNX: MMM, no exudates, no oropharyngeal erythema or ulceration NECK: supple, no JVD LYMPH:  no palpable lymphadenopathy in the cervical, axillary or inguinal regions LUNGS: clear to auscultation b/l with normal respiratory effort HEART: regular rate & rhythm ABDOMEN:  normoactive bowel sounds , non tender, not distended. Extremity: no pedal edema PSYCH: alert & oriented x 3 with fluent speech NEURO: no focal motor/sensory deficits  Hospital Course: Active Problems:  Diffuse Large B cell lymphoma ([place]) Encounter for antineoplastic chemotherapy  Diet: Low salt with high-protein supplements  Activity: Infection precautions as directed  Condition at  discharge: Stable  Signed: Mikey Bussing 09/27/2018, 9:36 AM   ADDENDUM  .Patient was Personally and independently interviewed, examined and relevant elements of the history of present illness were reviewed in details and an assessment and plan was created. All elements of the patient's history of present illness , assessment and plan were discussed in details with Mikey Bussing NP. The above documentation reflects our combined findings assessment and plan.  Sullivan Lone MD MS TT discharging patient >30 mins

## 2018-09-27 NOTE — Progress Notes (Signed)
Pt discharged home with spouse in stable condition.  Discharge instructions given. Pt verbalized understanding. No immediate questions or concerns at this time. Pt ambulated off unit via wheelchair.

## 2018-09-30 ENCOUNTER — Other Ambulatory Visit: Payer: Self-pay | Admitting: Hematology

## 2018-09-30 ENCOUNTER — Ambulatory Visit: Payer: Medicare Other

## 2018-09-30 ENCOUNTER — Inpatient Hospital Stay: Payer: Medicare Other | Attending: Hematology

## 2018-09-30 VITALS — BP 149/69 | HR 93 | Temp 97.9°F | Resp 18

## 2018-09-30 DIAGNOSIS — C8338 Diffuse large B-cell lymphoma, lymph nodes of multiple sites: Secondary | ICD-10-CM | POA: Insufficient documentation

## 2018-09-30 DIAGNOSIS — I10 Essential (primary) hypertension: Secondary | ICD-10-CM | POA: Insufficient documentation

## 2018-09-30 DIAGNOSIS — M7989 Other specified soft tissue disorders: Secondary | ICD-10-CM | POA: Insufficient documentation

## 2018-09-30 DIAGNOSIS — E538 Deficiency of other specified B group vitamins: Secondary | ICD-10-CM | POA: Insufficient documentation

## 2018-09-30 DIAGNOSIS — K449 Diaphragmatic hernia without obstruction or gangrene: Secondary | ICD-10-CM | POA: Diagnosis not present

## 2018-09-30 DIAGNOSIS — Z5112 Encounter for antineoplastic immunotherapy: Secondary | ICD-10-CM | POA: Diagnosis not present

## 2018-09-30 DIAGNOSIS — Z7689 Persons encountering health services in other specified circumstances: Secondary | ICD-10-CM | POA: Insufficient documentation

## 2018-09-30 DIAGNOSIS — I700 Atherosclerosis of aorta: Secondary | ICD-10-CM | POA: Insufficient documentation

## 2018-09-30 DIAGNOSIS — Z7189 Other specified counseling: Secondary | ICD-10-CM

## 2018-09-30 DIAGNOSIS — G40909 Epilepsy, unspecified, not intractable, without status epilepticus: Secondary | ICD-10-CM | POA: Diagnosis not present

## 2018-09-30 DIAGNOSIS — K219 Gastro-esophageal reflux disease without esophagitis: Secondary | ICD-10-CM | POA: Insufficient documentation

## 2018-09-30 DIAGNOSIS — D61810 Antineoplastic chemotherapy induced pancytopenia: Secondary | ICD-10-CM | POA: Diagnosis not present

## 2018-09-30 MED ORDER — DIPHENHYDRAMINE HCL 25 MG PO CAPS
ORAL_CAPSULE | ORAL | Status: AC
  Filled 2018-09-30: qty 2

## 2018-09-30 MED ORDER — PEGFILGRASTIM INJECTION 6 MG/0.6ML ~~LOC~~
PREFILLED_SYRINGE | SUBCUTANEOUS | Status: AC
  Filled 2018-09-30: qty 0.6

## 2018-09-30 MED ORDER — DIPHENHYDRAMINE HCL 25 MG PO CAPS
50.0000 mg | ORAL_CAPSULE | Freq: Once | ORAL | Status: AC
  Administered 2018-09-30: 50 mg via ORAL

## 2018-09-30 MED ORDER — SODIUM CHLORIDE 0.9 % IV SOLN
375.0000 mg/m2 | Freq: Once | INTRAVENOUS | Status: AC
  Administered 2018-09-30: 700 mg via INTRAVENOUS
  Filled 2018-09-30: qty 50

## 2018-09-30 MED ORDER — ACETAMINOPHEN 325 MG PO TABS
ORAL_TABLET | ORAL | Status: AC
  Filled 2018-09-30: qty 2

## 2018-09-30 MED ORDER — HEPARIN SOD (PORK) LOCK FLUSH 100 UNIT/ML IV SOLN
500.0000 [IU] | Freq: Once | INTRAVENOUS | Status: AC | PRN
  Administered 2018-09-30: 500 [IU]
  Filled 2018-09-30: qty 5

## 2018-09-30 MED ORDER — ACETAMINOPHEN 325 MG PO TABS
650.0000 mg | ORAL_TABLET | Freq: Once | ORAL | Status: AC
  Administered 2018-09-30: 650 mg via ORAL

## 2018-09-30 MED ORDER — SODIUM CHLORIDE 0.9 % IV SOLN
Freq: Once | INTRAVENOUS | Status: AC
  Administered 2018-09-30: 14:00:00 via INTRAVENOUS
  Filled 2018-09-30: qty 250

## 2018-09-30 MED ORDER — SODIUM CHLORIDE 0.9% FLUSH
10.0000 mL | INTRAVENOUS | Status: DC | PRN
  Administered 2018-09-30: 10 mL
  Filled 2018-09-30: qty 10

## 2018-09-30 MED ORDER — PEGFILGRASTIM INJECTION 6 MG/0.6ML ~~LOC~~
6.0000 mg | PREFILLED_SYRINGE | Freq: Once | SUBCUTANEOUS | Status: AC
  Administered 2018-09-30: 6 mg via SUBCUTANEOUS

## 2018-10-04 NOTE — Progress Notes (Signed)
HEMATOLOGY/ONCOLOGY CLINIC NOTE  Date of Service: 10/07/2018  Patient Care Team: [name], MD as PCP - General (Internal Medicine)  CHIEF COMPLAINTS/PURPOSE OF CONSULTATION:  Large B-Cell Lymphoma  Oncologic History:  Taylor Huynh presented with extranodal involvement in the terminal ileum s/p gross tumor resection in November 2010, and was diagnosed with High Grade B-Cell Non-Hodgkin's Lymphoma. She was treated with 4 cycles of R-CHOP between 07/05/09 and 09/06/09.  HISTORY OF PRESENTING ILLNESS:   Taylor Huynh is a wonderful 78 y.o. female who has been referred to [place] by Dr. Lawerance Cruel for evaluation and management of Large B-Cell Lymphoma. She is accompanied today by her daugher. The pt reports that she is doing well overall.  The pt reports that she found a lump in her right groin just before 07/17/18. She was evaluated by her PCP on 07/18/18 with a CT A/P, as noted below. She denies this lump changing much since she first noticed it, and endorses some soreness. The pt denies fevers, chills, night sweats or unexpected weight loss. Prior to this, the patient denies any changes in her quality of life or ability to function. She notes that her right leg became swollen yesterday as well. She denies changes in her bowel movements, abdominal pains, and left leg swelling. She denies any problems passing urine nor blood in the urine. She denies heart problems or recent chest pain or SOB. She denies changes in vision or headaches.   The pt has been eating well and endorses good appetite.   The pt takes Amlodipine every other day. She takes '20mg'$  Prilosec every day, as well as Vitamin B12 replacement. She denies stroke history, lung problems, nor heart problems. She notes a history of minor seizures, which she hasn't had since she began B12 replacement several years ago. The pt has never smoked cigarettes. She notes that she does not need help with any daily  activities and lives with her husband who also functions independently. She attends a local gym regularly.   Of note prior to the patient's visit today, pt has had a CT A/P completed on 07/18/18 with results revealing Extensive abdominal-pelvic adenopathy, consistent with recurrent lymphoma. 2. Tiny hiatal hernia. 3. Pelvic floor laxity. Aortic Atherosclerosis.  Most recent lab results (08/06/18) of CBC is as follows: all values are WNL except for RBC at 3.75, HGB at 10.6, HCT at 34.0  On review of systems, pt reports sore right inguinal lump, right leg swelling, good energy levels, sore right thigh, and denies changes in vision, headaches, abdominal pains, changes in bowel habits, problems passing urine, blood in the urine, left leg swelling, fevers, chills, night sweats, noticing other lumps or bumps, back pain, pain along the spine, calf pain, and any other symptoms.   On PMHx the pt reports High Grade B-Cell Non-Hodgkin's lymphoma in 2010, HTN, Vitamin B12 deficiency, minor seizures, GERD. On Social Hx the pt denies chemical exposure.  Interval History:   Taylor Huynh returns today for management and evaluation of her newly diagnosed Recurrent/New High Grade B-Cell Non-Hodgkin's Lymphoma. I last saw the pt as an inpatient with C2 EPOCH-R discharge on 09/27/18. She is accompanied today by her daughter. The pt reports that she is doing well overall.  The pt reports that she has had good energy levels and denies developing any new concerns nor concerns for infections. She denies repeated mouth sores, and notes that her previous leg swelling has completely resolved. She denies fevers, chills or night  sweats. She notes that she is eating better and denies abdominal pains.  Lab results today (10/07/18) of CBC w/diff and CMP is as follows: all values are WNL except for WBC at 11.6k, RBC at 3.47, HGB at 9.7, HCT at 31.5, RDW at 15.9, PLT at 148k, nRBC at 0.4%, ANC at 8.5k, Monocytes at 1.1k,  Potassium at 3.3, Glucose at 100, Calcium at 8.6, AST at 12, Total bilirubin at 0.2. 10/07/18 LDH at 268  On review of systems, pt reports eating better, good energy levels, and denies concerns for infections, mouth sores, fevers, chills, night sweats, leg swelling, abdominal pains, and any other symptoms.  MEDICAL HISTORY:  Past Medical History:  Diagnosis Date   GERD (gastroesophageal reflux disease)    HH (hiatus hernia) 12/12/2011   Hypertension    Leukopenia 12/12/2011   nhl dx'd 2010   Seizures ([place]) reports "years ago"   Syncope 12/12/2011   Hx seizures - not documented - age 55; recent syncope 6/12 and x2, 12/12; negative MRI brain 1/13    SURGICAL HISTORY: Past Surgical History:  Procedure Laterality Date   COLON SURGERY     IR IMAGING GUIDED PORT INSERTION  08/23/2018    SOCIAL HISTORY: Social History   Socioeconomic History   Marital status: Married    Spouse name: Not on file   Number of children: Not on file   Years of education: Not on file   Highest education level: Not on file  Occupational History   Not on file  Social Needs   Financial resource strain: Not on file   Food insecurity:    Worry: Not on file    Inability: Not on file   Transportation needs:    Medical: Not on file    Non-medical: Not on file  Tobacco Use   Smoking status: Never Smoker   Smokeless tobacco: Never Used  Substance and Sexual Activity   Alcohol use: No   Drug use: No   Sexual activity: Not on file  Lifestyle   Physical activity:    Days per week: Not on file    Minutes per session: Not on file   Stress: Not on file  Relationships   Social connections:    Talks on phone: Not on file    Gets together: Not on file    Attends religious service: Not on file    Active member of club or organization: Not on file    Attends meetings of clubs or organizations: Not on file    Relationship status: Not on file   Intimate partner violence:    Fear of  current or ex partner: Not on file    Emotionally abused: Not on file    Physically abused: Not on file    Forced sexual activity: Not on file  Other Topics Concern   Not on file  Social History Narrative   Not on file    FAMILY HISTORY: No family history on file.  ALLERGIES:  has No Known Allergies.  MEDICATIONS:  Current Outpatient Medications  Medication Sig Dispense Refill   amLODipine (NORVASC) 10 MG tablet Take 10 mg by mouth daily.      cholecalciferol (VITAMIN D) 1000 UNITS tablet Take 1,000 Units by mouth daily.       lidocaine-prilocaine (EMLA) cream Apply 1 application topically as needed. (Patient taking differently: Apply 1 application topically as needed (port access). ) 30 g 1   Melatonin 5 MG TABS Take 5 mg by mouth daily.  polyethylene glycol (MIRALAX / GLYCOLAX) packet Take 17 g by mouth daily. (Patient not taking: Reported on 09/23/2018) 14 each 0   senna-docusate (SENOKOT-S) 8.6-50 MG tablet Take 2 tablets by mouth at bedtime as needed for mild constipation. (Patient not taking: Reported on 09/23/2018) 60 tablet 1   vitamin B-12 (CYANOCOBALAMIN) 1000 MCG tablet Take 1,000 mcg by mouth every other day.     No current facility-administered medications for this visit.     REVIEW OF SYSTEMS:    A 10+ POINT REVIEW OF SYSTEMS WAS OBTAINED including neurology, dermatology, psychiatry, cardiac, respiratory, lymph, extremities, GI, GU, Musculoskeletal, constitutional, breasts, reproductive, HEENT.  All pertinent positives are noted in the HPI.  All others are negative.   PHYSICAL EXAMINATION: ECOG PERFORMANCE STATUS: 1 - Symptomatic but completely ambulatory  . Vitals:   10/07/18 1158  BP: (!) 160/75  Pulse: 100  Resp: 17  Temp: 98.4 F (36.9 C)  SpO2: 100%   Filed Weights   10/07/18 1158  Weight: 172 lb 14.4 oz (78.4 kg)   .Body mass index is 29.68 kg/m.  GENERAL:alert, in no acute distress and comfortable SKIN: no acute rashes, no significant  lesions EYES: conjunctiva are pink and non-injected, sclera anicteric OROPHARYNX: MMM, no exudates, no oropharyngeal erythema or ulceration NECK: supple, no JVD LYMPH: no palpable lymphadenopathy in the cervical, inguinal or axillary regions LUNGS: clear to auscultation b/l with normal respiratory effort HEART: regular rate & rhythm ABDOMEN:  normoactive bowel sounds , non tender, not distended. No palpable hepatosplenomegaly.  Extremity: no pedal edema PSYCH: alert & oriented x 3 with fluent speech NEURO: no focal motor/sensory deficits   LABORATORY DATA:  I have reviewed the data as listed  . CBC Latest Ref Rng & Units 10/07/2018 09/27/2018 09/26/2018  WBC 4.0 - 10.5 K/uL 11.6(H) 3.5(L) 6.3  Hemoglobin 12.0 - 15.0 g/dL 9.7(L) 8.8(L) 8.5(L)  Hematocrit 36.0 - 46.0 % 31.5(L) 28.6(L) 28.5(L)  Platelets 150 - 400 K/uL 148(L) 364 373    . CMP Latest Ref Rng & Units 10/07/2018 09/27/2018 09/26/2018  Glucose 70 - 99 mg/dL 100(H) 111(H) 116(H)  BUN 8 - 23 mg/dL _0 Creatinine 0.44 - 1.00 mg/dL 0.83 0.79 0.82  Sodium 135 - 145 mmol/L 142 140 142  Potassium 3.5 - 5.1 mmol/L 3.3(L) 3.6 4.3  Chloride 98 - 111 mmol/L 107 105 113(H)  CO2 22 - 32 mmol/L _1 Calcium 8.9 - 10.3 mg/dL 8.6(L) 8.8(L) 8.9  Total Protein 6.5 - 8.1 g/dL 6.7 - -  Total Bilirubin 0.3 - 1.2 mg/dL 0.2(L) - -  Alkaline Phos 38 - 126 U/L 96 - -  AST 15 - 41 U/L 12(L) - -  ALT 0 - 44 U/L 13 - -   08/06/18 Biopsy:    06/01/09 Pathology:     07/18/18 CBC w/diff:    RADIOGRAPHIC STUDIES: I have personally reviewed the radiological images as listed and agreed with the findings in the report.  08/14/18 US Venous RLE:   No results found.  ASSESSMENT & PLAN:   78 y.o. female with  1. 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 Newly Diagnosed  Recurrent/New High Grade B-Cell Non-Hodgkin's Lymphoma, Stage IV FISH negative for findings of double hit lymphoma  07/18/18 CT A/P revealed Extensive abdominal-pelvic adenopathy, consistent with recurrent lymphoma. 2. Tiny hiatal hernia. 3. Pelvic floor laxity. Aortic Atherosclerosis.  Labs upon initial presentation from1/14/20, WBC normal at 4.3k, HGB at 10.6, PLT normal at 201k  08/06/18 Right Inguinal LN Biopsy which revealed Large B-Cell Lymphoma with a Ki67 of 90%. FISH is negative for rearrangements of BCL2, BCL6, and MYC. 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  08/13/18 Hep B and Hep C negative  08/21/18 ECHO which revealed LV EF of 50-55%  08/22/18 PET/CT revealedProgression, since 46/96/2952, of hypermetabolic abdominopelvic adenopathy, consistent with active lymphoma. 2. CT occult hypermetabolic osseous foci, also most consistent with active lymphoma. 3. No evidence of soft tissue disease above the diaphragm. 4. Focus of hypermetabolism within the anus is likely physiologic. Consider physical exam correlation. 5. Aortic Atherosclerosis.  3. RLE swelling r/o DVT. Could be from lymphedema related to her new/recurrent NHL 1/21/10US Venous RLE to rule out DVT - reviewed - no VTE  4. Hypokalemia-resolved  5. Pancytopenia due to chemotherapy and DLBCL  6. 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 No evidence of bleeding  7. DVT Px - lovenox [place]  PLAN: -Discussed pt labwork today, 10/07/18; blood counts and chemistries are stable. LDH improved to 268. -Continue with salt and baking soda mouthwashes 4-5 times each day -The pt has no prohibitive toxicities from C2 dose reduced EPOCH-R at this time. Dose reduced  for age and previous chemotherapy exposure. -Advised infection prevention strategies and crowd avoidance.  -Will continue watching HGB, and will support with PRBC transfusion if necessary for hgb<8. Pt denies light headedness, fatigue, and dizziness at this time.  -Miralax -Will gauge indications for larger doses pending her display of tolerance  -Will complete PET/CT after C3 for evaluation of treatment efficacy. -encouraged patient to optimize po intake -Will plan for C3 EPOCH-R admission on 10/14/18 -Outpatient Rituxan and Neulasta on 10/21/18   [facility] inpatient chemotherapy admission from 10/14/2018 for 5 days Rituxan and neulasta as outpatient on 3/302020 PET/CT on 10/24/2018 RTC with Dr Irene Limbo with labs on 10/28/2018   All of the patients questions were answered with apparent satisfaction. The patient knows to call the clinic with any problems, questions or concerns.  The total time spent in the appt was 25 minutes and more than 50% was on counseling and direct patient cares.    Sullivan Lone MD MS AAHIVMS [facility] Hematology/Oncology Physician [facility]  (Office):       [phone] (Work cell):  [phone] (Fax):           [phone]  10/07/2018 12:43 PM  I, Baldwin [place], am acting as a scribe for Dr. Sullivan Lone.   .I have reviewed the above documentation for accuracy and completeness, and I agree with the above. Brunetta Genera MD

## 2018-10-07 ENCOUNTER — Telehealth: Payer: Self-pay | Admitting: Hematology

## 2018-10-07 ENCOUNTER — Inpatient Hospital Stay: Payer: Medicare Other | Admitting: Hematology

## 2018-10-07 ENCOUNTER — Other Ambulatory Visit: Payer: Self-pay

## 2018-10-07 ENCOUNTER — Inpatient Hospital Stay: Payer: Medicare Other

## 2018-10-07 VITALS — BP 160/75 | HR 100 | Temp 98.4°F | Resp 17 | Ht 64.0 in | Wt 172.9 lb

## 2018-10-07 DIAGNOSIS — D61810 Antineoplastic chemotherapy induced pancytopenia: Secondary | ICD-10-CM | POA: Diagnosis not present

## 2018-10-07 DIAGNOSIS — C8338 Diffuse large B-cell lymphoma, lymph nodes of multiple sites: Secondary | ICD-10-CM | POA: Diagnosis not present

## 2018-10-07 DIAGNOSIS — Z5112 Encounter for antineoplastic immunotherapy: Secondary | ICD-10-CM | POA: Diagnosis not present

## 2018-10-07 DIAGNOSIS — I700 Atherosclerosis of aorta: Secondary | ICD-10-CM

## 2018-10-07 DIAGNOSIS — K449 Diaphragmatic hernia without obstruction or gangrene: Secondary | ICD-10-CM

## 2018-10-07 DIAGNOSIS — K219 Gastro-esophageal reflux disease without esophagitis: Secondary | ICD-10-CM

## 2018-10-07 DIAGNOSIS — Z7689 Persons encountering health services in other specified circumstances: Secondary | ICD-10-CM | POA: Diagnosis not present

## 2018-10-07 DIAGNOSIS — I10 Essential (primary) hypertension: Secondary | ICD-10-CM

## 2018-10-07 DIAGNOSIS — G40909 Epilepsy, unspecified, not intractable, without status epilepticus: Secondary | ICD-10-CM

## 2018-10-07 DIAGNOSIS — E538 Deficiency of other specified B group vitamins: Secondary | ICD-10-CM

## 2018-10-07 DIAGNOSIS — M7989 Other specified soft tissue disorders: Secondary | ICD-10-CM

## 2018-10-07 LAB — CMP (CANCER CENTER ONLY)
ALT: 13 U/L (ref 0–44)
AST: 12 U/L — ABNORMAL LOW (ref 15–41)
Albumin: 3.6 g/dL (ref 3.5–5.0)
Alkaline Phosphatase: 96 U/L (ref 38–126)
Anion gap: 11 (ref 5–15)
BUN: 9 mg/dL (ref 8–23)
CHLORIDE: 107 mmol/L (ref 98–111)
CO2: 24 mmol/L (ref 22–32)
Calcium: 8.6 mg/dL — ABNORMAL LOW (ref 8.9–10.3)
Creatinine: 0.83 mg/dL (ref 0.44–1.00)
GFR, Est AFR Am: >60 mL/min (ref >60)
GFR, Estimated: >60 mL/min (ref >60)
Glucose, Bld: 100 mg/dL — ABNORMAL HIGH (ref 70–99)
POTASSIUM: 3.3 mmol/L — AB (ref 3.5–5.1)
Sodium: 142 mmol/L (ref 135–145)
Total Bilirubin: 0.2 mg/dL — ABNORMAL LOW (ref 0.3–1.2)
Total Protein: 6.7 g/dL (ref 6.5–8.1)

## 2018-10-07 LAB — LACTATE DEHYDROGENASE: LDH: 268 U/L — ABNORMAL HIGH (ref 98–192)

## 2018-10-07 LAB — CBC WITH DIFFERENTIAL/PLATELET
Abs Immature Granulocytes: 0.64 10*3/uL — ABNORMAL HIGH (ref 0.00–0.07)
Basophils Absolute: 0.1 10*3/uL (ref 0.0–0.1)
Basophils Relative: 1 %
Eosinophils Absolute: 0.1 10*3/uL (ref 0.0–0.5)
Eosinophils Relative: 1 %
HEMATOCRIT: 31.5 % — AB (ref 36.0–46.0)
Hemoglobin: 9.7 g/dL — ABNORMAL LOW (ref 12.0–15.0)
IMMATURE GRANULOCYTES: 6 %
Lymphocytes Relative: 10 %
Lymphs Abs: 1.2 10*3/uL (ref 0.7–4.0)
MCH: 28 pg (ref 26.0–34.0)
MCHC: 30.8 g/dL (ref 30.0–36.0)
MCV: 90.8 fL (ref 80.0–100.0)
Monocytes Absolute: 1.1 10*3/uL — ABNORMAL HIGH (ref 0.1–1.0)
Monocytes Relative: 9 %
Neutro Abs: 8.5 10*3/uL — ABNORMAL HIGH (ref 1.7–7.7)
Neutrophils Relative %: 73 %
Platelets: 148 10*3/uL — ABNORMAL LOW (ref 150–400)
RBC: 3.47 MIL/uL — ABNORMAL LOW (ref 3.87–5.11)
RDW: 15.9 % — ABNORMAL HIGH (ref 11.5–15.5)
WBC: 11.6 10*3/uL — ABNORMAL HIGH (ref 4.0–10.5)
nRBC: 0.4 % — ABNORMAL HIGH (ref 0.0–0.2)

## 2018-10-07 NOTE — Telephone Encounter (Signed)
Scheduled appt per 3/16 los. ° °Printed calendar and avs. °

## 2018-10-10 ENCOUNTER — Telehealth: Payer: Self-pay | Admitting: *Deleted

## 2018-10-10 NOTE — Telephone Encounter (Signed)
Patient scheduled for inpatient admission AM of 3/23 per [facility] in bed placement.Notified 6E and pharmacy. Patient contacted and told to expect call on 3/23. Patient verbalized understanding.

## 2018-10-14 ENCOUNTER — Inpatient Hospital Stay (HOSPITAL_COMMUNITY)
Admission: RE | Admit: 2018-10-14 | Discharge: 2018-10-18 | DRG: 846 | Disposition: A | Discharge status: Home / Self Care | Payer: Medicare Other | Attending: Hematology | Admitting: Hematology

## 2018-10-14 ENCOUNTER — Other Ambulatory Visit: Payer: Self-pay

## 2018-10-14 DIAGNOSIS — E876 Hypokalemia: Secondary | ICD-10-CM | POA: Diagnosis present

## 2018-10-14 DIAGNOSIS — C8338 Diffuse large B-cell lymphoma, lymph nodes of multiple sites: Secondary | ICD-10-CM

## 2018-10-14 DIAGNOSIS — R040 Epistaxis: Secondary | ICD-10-CM | POA: Diagnosis present

## 2018-10-14 DIAGNOSIS — I10 Essential (primary) hypertension: Secondary | ICD-10-CM | POA: Diagnosis present

## 2018-10-14 DIAGNOSIS — D630 Anemia in neoplastic disease: Secondary | ICD-10-CM | POA: Diagnosis present

## 2018-10-14 DIAGNOSIS — D61810 Antineoplastic chemotherapy induced pancytopenia: Secondary | ICD-10-CM | POA: Diagnosis present

## 2018-10-14 DIAGNOSIS — T451X5A Adverse effect of antineoplastic and immunosuppressive drugs, initial encounter: Secondary | ICD-10-CM | POA: Diagnosis present

## 2018-10-14 DIAGNOSIS — D649 Anemia, unspecified: Secondary | ICD-10-CM

## 2018-10-14 DIAGNOSIS — K219 Gastro-esophageal reflux disease without esophagitis: Secondary | ICD-10-CM | POA: Diagnosis present

## 2018-10-14 DIAGNOSIS — C8330 Diffuse large B-cell lymphoma, unspecified site: Secondary | ICD-10-CM | POA: Diagnosis present

## 2018-10-14 DIAGNOSIS — Z7189 Other specified counseling: Secondary | ICD-10-CM

## 2018-10-14 DIAGNOSIS — Z5111 Encounter for antineoplastic chemotherapy: Principal | ICD-10-CM

## 2018-10-14 DIAGNOSIS — K5900 Constipation, unspecified: Secondary | ICD-10-CM | POA: Diagnosis not present

## 2018-10-14 DIAGNOSIS — I700 Atherosclerosis of aorta: Secondary | ICD-10-CM | POA: Diagnosis present

## 2018-10-14 LAB — CBC WITH DIFFERENTIAL/PLATELET
Abs Immature Granulocytes: 0.04 10*3/uL (ref 0.00–0.07)
BASOS ABS: 0 10*3/uL (ref 0.0–0.1)
Basophils Relative: 1 %
EOS PCT: 1 %
Eosinophils Absolute: 0 10*3/uL (ref 0.0–0.5)
HEMATOCRIT: 29.4 % — AB (ref 36.0–46.0)
Hemoglobin: 9.1 g/dL — ABNORMAL LOW (ref 12.0–15.0)
Immature Granulocytes: 1 %
Lymphocytes Relative: 8 %
Lymphs Abs: 0.5 10*3/uL — ABNORMAL LOW (ref 0.7–4.0)
MCH: 28 pg (ref 26.0–34.0)
MCHC: 31 g/dL (ref 30.0–36.0)
MCV: 90.5 fL (ref 80.0–100.0)
Monocytes Absolute: 0.1 10*3/uL (ref 0.1–1.0)
Monocytes Relative: 2 %
Neutro Abs: 5.6 10*3/uL (ref 1.7–7.7)
Neutrophils Relative %: 87 %
Platelets: 361 10*3/uL (ref 150–400)
RBC: 3.25 MIL/uL — ABNORMAL LOW (ref 3.87–5.11)
RDW: 16.3 % — ABNORMAL HIGH (ref 11.5–15.5)
WBC: 6.3 10*3/uL (ref 4.0–10.5)
nRBC: 0 % (ref 0.0–0.2)

## 2018-10-14 LAB — COMPREHENSIVE METABOLIC PANEL
ALT: 13 U/L (ref 0–44)
AST: 15 U/L (ref 15–41)
Albumin: 3.5 g/dL (ref 3.5–5.0)
Alkaline Phosphatase: 62 U/L (ref 38–126)
Anion gap: 7 (ref 5–15)
BUN: 12 mg/dL (ref 8–23)
CO2: 22 mmol/L (ref 22–32)
Calcium: 8.3 mg/dL — ABNORMAL LOW (ref 8.9–10.3)
Chloride: 111 mmol/L (ref 98–111)
Creatinine, Ser: 0.86 mg/dL (ref 0.44–1.00)
GFR calc non Af Amer: >60 mL/min (ref >60)
Glucose, Bld: 138 mg/dL — ABNORMAL HIGH (ref 70–99)
Potassium: 3.3 mmol/L — ABNORMAL LOW (ref 3.5–5.1)
Sodium: 140 mmol/L (ref 135–145)
Total Bilirubin: 0.3 mg/dL (ref 0.3–1.2)
Total Protein: 6.4 g/dL — ABNORMAL LOW (ref 6.5–8.1)

## 2018-10-14 MED ORDER — SODIUM CHLORIDE 0.9 % IV SOLN
Freq: Once | INTRAVENOUS | Status: AC
  Administered 2018-10-14: 18 mg via INTRAVENOUS
  Filled 2018-10-14: qty 4

## 2018-10-14 MED ORDER — SENNOSIDES-DOCUSATE SODIUM 8.6-50 MG PO TABS
2.0000 | ORAL_TABLET | Freq: Every evening | ORAL | Status: DC | PRN
  Administered 2018-10-16: 2 via ORAL
  Filled 2018-10-14: qty 2

## 2018-10-14 MED ORDER — VINCRISTINE SULFATE CHEMO INJECTION 1 MG/ML
Freq: Once | INTRAVENOUS | Status: AC
  Administered 2018-10-14: 15:00:00 via INTRAVENOUS
  Filled 2018-10-14: qty 6

## 2018-10-14 MED ORDER — ENOXAPARIN SODIUM 40 MG/0.4ML ~~LOC~~ SOLN
40.0000 mg | SUBCUTANEOUS | Status: DC
  Administered 2018-10-14 – 2018-10-17 (×4): 40 mg via SUBCUTANEOUS
  Filled 2018-10-14 (×3): qty 0.4

## 2018-10-14 MED ORDER — PANTOPRAZOLE SODIUM 40 MG PO TBEC
40.0000 mg | DELAYED_RELEASE_TABLET | Freq: Every day | ORAL | Status: DC
  Administered 2018-10-15 – 2018-10-18 (×4): 40 mg via ORAL
  Filled 2018-10-14 (×4): qty 1

## 2018-10-14 MED ORDER — AMLODIPINE BESYLATE 10 MG PO TABS
10.0000 mg | ORAL_TABLET | ORAL | Status: DC
  Administered 2018-10-15 – 2018-10-17 (×2): 10 mg via ORAL
  Filled 2018-10-14 (×2): qty 1

## 2018-10-14 MED ORDER — POLYETHYLENE GLYCOL 3350 17 G PO PACK
17.0000 g | PACK | Freq: Every day | ORAL | Status: DC
  Administered 2018-10-14 – 2018-10-18 (×4): 17 g via ORAL
  Filled 2018-10-14 (×4): qty 1

## 2018-10-14 MED ORDER — SODIUM CHLORIDE 0.9 % IV SOLN
INTRAVENOUS | Status: DC
  Administered 2018-10-14 – 2018-10-16 (×2): via INTRAVENOUS

## 2018-10-14 MED ORDER — PREDNISONE 20 MG PO TABS
60.0000 mg | ORAL_TABLET | Freq: Every day | ORAL | Status: AC
  Administered 2018-10-14 – 2018-10-18 (×5): 60 mg via ORAL
  Filled 2018-10-14 (×5): qty 3

## 2018-10-14 MED ORDER — LIDOCAINE-PRILOCAINE 2.5-2.5 % EX CREA: 1.0000 "application " | TOPICAL_CREAM | CUTANEOUS | Status: DC | PRN

## 2018-10-14 NOTE — H&P (Signed)
HEMATOLOGY/ONCOLOGY CONSULTATION NOTE  Date of Service: 10/14/2018  Patient Care Team: [name], MD as PCP - General (Internal Medicine)  CHIEF COMPLAINTS/PURPOSE OF CONSULTATION:  C3 dose reduced EPOCH-R for DLBCL  HISTORY OF PRESENTING ILLNESS:   [name] is a wonderful 78 y.o. 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 The pt reports that she is doing well overall.   The pt reports that she has not developed any new concerns in the interim, nor concerns for infections. She denies leg swelling, fevers, chills or night sweats.  Lab results today (10/14/18) of CBC w/diff and CMP reviewed  On review of systems, pt reports stable energy levels, and denies fevers, chills, night sweats, leg swelling, concerns for infections, abdominal pains, or any other symptoms.   MEDICAL HISTORY:  Past Medical History:  Diagnosis Date  . GERD (gastroesophageal reflux disease)   . HH (hiatus hernia) 12/12/2011  . Hypertension   . Leukopenia 12/12/2011  . nhl dx'd 2010  . Seizures ([place]) reports "years ago"  . Syncope 12/12/2011   Hx seizures - not documented - age 3; recent syncope 6/12 and x2, 12/12; negative MRI brain 1/13    SURGICAL HISTORY: Past Surgical History:  Procedure Laterality Date  . COLON SURGERY    . IR IMAGING GUIDED PORT INSERTION  08/23/2018    SOCIAL HISTORY: Social History   Socioeconomic History  . Marital status: Married    Spouse name: Not on file  . Number of children: Not on file  . Years of education: Not on file  . Highest education level: Not on file  Occupational History  . Not on file  Social Needs  . Financial resource strain: Not on file  . Food insecurity:    Worry: Not on file    Inability: Not on file  . Transportation needs:    Medical: Not on file    Non-medical: Not on file  Tobacco Use  . Smoking status: Never Smoker  . Smokeless tobacco: Never  Used  Substance and Sexual Activity  . Alcohol use: No  . Drug use: No  . Sexual activity: Not on file  Lifestyle  . Physical activity:    Days per week: Not on file    Minutes per session: Not on file  . Stress: Not on file  Relationships  . Social connections:    Talks on phone: Not on file    Gets together: Not on file    Attends religious service: Not on file    Active member of club or organization: Not on file    Attends meetings of clubs or organizations: Not on file    Relationship status: Not on file  . Intimate partner violence:    Fear of current or ex partner: Not on file    Emotionally abused: Not on file    Physically abused: Not on file    Forced sexual activity: Not on file  Other Topics Concern  . Not on file  Social History Narrative  . Not on file    FAMILY HISTORY: No family history on file.  ALLERGIES:  has No Known Allergies.  MEDICATIONS:  No current facility-administered medications for this encounter.     REVIEW OF SYSTEMS:    10 Point review of Systems was done is negative except as noted above.  PHYSICAL EXAMINATION: ECOG PERFORMANCE STATUS: 1 - Symptomatic but completely ambulatory  . Vitals:  10/14/18 0912  BP: (!) 152/75  Pulse: 89  Resp: 16  Temp: 98.3 F (36.8 C)  SpO2: 100%   Filed Weights   10/14/18 0912  Weight: 172 lb 11.2 oz (78.3 kg)   .Body mass index is 29.64 kg/m.  GENERAL:alert, in no acute distress and comfortable SKIN: no acute rashes, no significant lesions EYES: conjunctiva are pink and non-injected, sclera anicteric OROPHARYNX: MMM, no exudates, no oropharyngeal erythema or ulceration NECK: supple, no JVD LYMPH:  no palpable lymphadenopathy in the cervical, axillary or inguinal regions LUNGS: clear to auscultation b/l with normal respiratory effort HEART: regular rate & rhythm ABDOMEN:  normoactive bowel sounds , non tender, not distended. Extremity: no pedal edema PSYCH: alert & oriented x 3 with  fluent speech NEURO: no focal motor/sensory deficits  LABORATORY DATA:  I have reviewed the data as listed  . CBC Latest Ref Rng & Units 10/07/2018 09/27/2018 09/26/2018  WBC 4.0 - 10.5 K/uL 11.6(H) 3.5(L) 6.3  Hemoglobin 12.0 - 15.0 g/dL 9.7(L) 8.8(L) 8.5(L)  Hematocrit 36.0 - 46.0 % 31.5(L) 28.6(L) 28.5(L)  Platelets 150 - 400 K/uL 148(L) 364 373    . CMP Latest Ref Rng & Units 10/07/2018 09/27/2018 09/26/2018  Glucose 70 - 99 mg/dL 100(H) 111(H) 116(H)  BUN 8 - 23 mg/dL '9 22 18  ' Creatinine 0.44 - 1.00 mg/dL 0.83 0.79 0.82  Sodium 135 - 145 mmol/L 142 140 142  Potassium 3.5 - 5.1 mmol/L 3.3(L) 3.6 4.3  Chloride 98 - 111 mmol/L 107 105 113(H)  CO2 22 - 32 mmol/L '24 29 26  ' Calcium 8.9 - 10.3 mg/dL 8.6(L) 8.8(L) 8.9  Total Protein 6.5 - 8.1 g/dL 6.7 - -  Total Bilirubin 0.3 - 1.2 mg/dL 0.2(L) - -  Alkaline Phos 38 - 126 U/L 96 - -  AST 15 - 41 U/L 12(L) - -  ALT 0 - 44 U/L 13 - -     RADIOGRAPHIC STUDIES: I have personally reviewed the radiological images as listed and agreed with the findings in the report. No results found.  ASSESSMENT & PLAN:  78 y.o. female with  1. 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 2. Tiny hiatal hernia. 3. Pelvic floor laxity. Aortic Atherosclerosis.  Labs upon initial presentation from1/14/20, WBC normal at 4.3k,  HGB at 10.6, PLT normal at 201k  08/06/18 Right Inguinal LN Biopsy which revealed Large B-Cell Lymphoma with a Ki67 of 90%. FISH is negative for rearrangements of BCL2, BCL6, and MYC. 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  08/13/18 Hep B and Hep C negative  08/21/18 ECHO which revealed LV EF of 50-55%  08/22/18 PET/CT revealedProgression, since 18/40/3754, of hypermetabolic abdominopelvic adenopathy, consistent with active lymphoma. 2. CT occult hypermetabolic osseous foci, also most consistent with active lymphoma. 3. No evidence of soft tissue disease above the diaphragm. 4. Focus of hypermetabolism within the anus is likely physiologic. Consider physical exam correlation. 5. Aortic Atherosclerosis.  3. RLE swelling r/o DVT.  Could be from lymphedema related to her new/recurrent NHL 1/21/10US Venous RLE to rule out DVT - reviewed - no VTE Now resolved  4. Hypokalemia-resolved  5. Pancytopenia due to chemotherapy and DLBCL  6. 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 No evidence of bleeding  7. DVT Px - lovenox [place]  PLAN: -Discussed pt labwork today, 10/14/18 -The pt has no prohibitive toxicities from continuing C3D1 dose reduced EPOCH-R at this time. Dose reduced for age and previous chemotherapy exposure. -chemotherapy orders placed, reviewed and signed -Continue eating well, staying hydrated, and staying as active as reasonably possible  -Continue with salt and baking soda mouthwashes 4-5 times each day -Will continue watching HGB, and will support with PRBC transfusion if necessary for hgb<8. Pt denies light headedness, fatigue, and dizziness at this time.  -Miralax -Encouraged patient to optimize po intake -Outpatient Rituxan and Neulasta on 10/21/18 -PET/CT on 10/24/18 -Will see pt back in clinic on 10/28/18   All of the patients questions were answered with apparent satisfaction. The patient knows to call the clinic with any problems, questions  or concerns.    Sullivan Lone MD MS AAHIVMS [facility] Hematology/Oncology Physician [facility]  (Office):       [phone] (Work cell):  [phone] (Fax):           [phone]  10/14/2018 10:53 AM  I, Baldwin [place], am acting as a scribe for Dr. Sullivan Lone.   .I have reviewed the above documentation for accuracy and completeness, and I agree with the above. Sullivan Lone MD MS

## 2018-10-14 NOTE — Progress Notes (Signed)
Ok to tx using labs from 10/07/18 per dr Irene Limbo

## 2018-10-14 NOTE — Progress Notes (Signed)
Calculations and dosing of doxorubicin, etoposide and vincristine verified by Nancy Marus, RN

## 2018-10-15 LAB — BASIC METABOLIC PANEL
ANION GAP: 7 (ref 5–15)
BUN: 13 mg/dL (ref 8–23)
CO2: 22 mmol/L (ref 22–32)
Calcium: 8.2 mg/dL — ABNORMAL LOW (ref 8.9–10.3)
Chloride: 113 mmol/L — ABNORMAL HIGH (ref 98–111)
Creatinine, Ser: 0.67 mg/dL (ref 0.44–1.00)
GFR calc Af Amer: >60 mL/min (ref >60)
GLUCOSE: 117 mg/dL — AB (ref 70–99)
Potassium: 3.5 mmol/L (ref 3.5–5.1)
Sodium: 142 mmol/L (ref 135–145)

## 2018-10-15 LAB — CBC
HCT: 27.7 % — ABNORMAL LOW (ref 36.0–46.0)
Hemoglobin: 8.3 g/dL — ABNORMAL LOW (ref 12.0–15.0)
MCH: 27.4 pg (ref 26.0–34.0)
MCHC: 30 g/dL (ref 30.0–36.0)
MCV: 91.4 fL (ref 80.0–100.0)
Platelets: 338 10*3/uL (ref 150–400)
RBC: 3.03 MIL/uL — ABNORMAL LOW (ref 3.87–5.11)
RDW: 16.2 % — ABNORMAL HIGH (ref 11.5–15.5)
WBC: 5.5 10*3/uL (ref 4.0–10.5)
nRBC: 0 % (ref 0.0–0.2)

## 2018-10-15 MED ORDER — SODIUM CHLORIDE 0.9 % IV SOLN
Freq: Once | INTRAVENOUS | Status: AC
  Administered 2018-10-15: 18 mg via INTRAVENOUS
  Filled 2018-10-15: qty 4

## 2018-10-15 MED ORDER — VINCRISTINE SULFATE CHEMO INJECTION 1 MG/ML
Freq: Once | INTRAVENOUS | Status: AC
  Administered 2018-10-15: 14:00:00 via INTRAVENOUS
  Filled 2018-10-15: qty 6

## 2018-10-15 NOTE — Progress Notes (Addendum)
HEMATOLOGY/ONCOLOGY INPATIENT PROGRESS NOTE  Date of Service: 10/15/2018  Inpatient Attending: .Brunetta Genera, MD   SUBJECTIVE:   Taylor Huynh is sitting in the recliner chair this morning. The pt reports that she is doing well overall.   The pt reports that she is enjoying some improved energy levels. She notes that she is eating well and has been ambulating periodically. She denies any leg swelling or any other new concerns.  Reports constipation this morning.  Lab results today (10/15/18) of CBC w/diff and CMP is as follows: all values are WNL except for RBC at 3.03, HGB at 8.3, HCT at 27.7, RDW 16.2, Chloride at 113, Glucose at 117, calcium 8.2.  On review of systems, pt reports improved energy levels, ports constipation and denies leg swelling, abdominal pain, and any other symptoms.   OBJECTIVE:  NAD  PHYSICAL EXAMINATION: . Vitals:   10/14/18 0912 10/14/18 1355 10/14/18 2030 10/15/18 0617  BP: (!) 152/75 (!) 144/78 137/69 (!) 144/83  Pulse: 89 96 77 79  Resp: '16 16 16 16  ' Temp: 98.3 F (36.8 C) 98 F (36.7 C) 97.9 F (36.6 C) (!) 97.5 F (36.4 C)  TempSrc: Oral Oral Oral Oral  SpO2: 100% 98% 96% 99%  Weight: 172 lb 11.2 oz (78.3 kg)     Height: '5\' 4"'  (1.626 m)      Filed Weights   10/14/18 0912  Weight: 172 lb 11.2 oz (78.3 kg)   .Body mass index is 29.64 kg/m.  GENERAL:alert, in no acute distress and comfortable SKIN: no acute rashes, no significant lesions EYES: conjunctiva are pink and non-injected, sclera anicteric OROPHARYNX: MMM, no exudate, no oropharyngeal erythema or ulceration NECK: supple, no JVD LYMPH:  no palpable lymphadenopathy in the cervical, axillary or inguinal regions LUNGS: clear to auscultation b/l with normal respiratory effort HEART: regular rate & rhythm ABDOMEN:  normoactive bowel sounds , non tender, not distended. No palpable hepatosplenomegaly.  Extremity: no pedal edema PSYCH: alert & oriented x 3 with fluent  speech NEURO: no focal motor/sensory deficits    MEDICAL HISTORY:  Past Medical History:  Diagnosis Date   GERD (gastroesophageal reflux disease)    HH (hiatus hernia) 12/12/2011   Hypertension    Leukopenia 12/12/2011   nhl dx'd 2010   Seizures ([place]) reports "years ago"   Syncope 12/12/2011   Hx seizures - not documented - age 50; recent syncope 6/12 and x2, 12/12; negative MRI brain 1/13    SURGICAL HISTORY: Past Surgical History:  Procedure Laterality Date   COLON SURGERY     IR IMAGING GUIDED PORT INSERTION  08/23/2018    SOCIAL HISTORY: Social History   Socioeconomic History   Marital status: Married    Spouse name: Not on file   Number of children: Not on file   Years of education: Not on file   Highest education level: Not on file  Occupational History   Not on file  Social Needs   Financial resource strain: Not on file   Food insecurity:    Worry: Not on file    Inability: Not on file   Transportation needs:    Medical: Not on file    Non-medical: Not on file  Tobacco Use   Smoking status: Never Smoker   Smokeless tobacco: Never Used  Substance and Sexual Activity   Alcohol use: No   Drug use: No   Sexual activity: Not on file  Lifestyle   Physical activity:    Days per  week: Not on file    Minutes per session: Not on file   Stress: Not on file  Relationships   Social connections:    Talks on phone: Not on file    Gets together: Not on file    Attends religious service: Not on file    Active member of club or organization: Not on file    Attends meetings of clubs or organizations: Not on file    Relationship status: Not on file   Intimate partner violence:    Fear of current or ex partner: Not on file    Emotionally abused: Not on file    Physically abused: Not on file    Forced sexual activity: Not on file  Other Topics Concern   Not on file  Social History Narrative   Not on file    FAMILY HISTORY: No family  history on file.  ALLERGIES:  has No Known Allergies.  MEDICATIONS:  Scheduled Meds:  amLODipine  10 mg Oral QODAY   DOXOrubicin/vinCRIStine/etoposide CHEMO IV infusion for Inpatient CI   Intravenous Once   enoxaparin (LOVENOX) injection  40 mg Subcutaneous Q24H   pantoprazole  40 mg Oral Daily   polyethylene glycol  17 g Oral Daily   predniSONE  60 mg Oral QAC breakfast   Continuous Infusions:  sodium chloride 20 mL/hr at 10/15/18 0741   PRN Meds:.lidocaine-prilocaine, senna-docusate  REVIEW OF SYSTEMS:    A 10+ POINT REVIEW OF SYSTEMS WAS OBTAINED including neurology, dermatology, psychiatry, cardiac, respiratory, lymph, extremities, GI, GU, Musculoskeletal, constitutional, breasts, reproductive, HEENT.  All pertinent positives are noted in the HPI.  All others are negative.   LABORATORY DATA:  I have reviewed the data as listed  . CBC Latest Ref Rng & Units 10/15/2018 10/14/2018 10/07/2018  WBC 4.0 - 10.5 K/uL 5.5 6.3 11.6(H)  Hemoglobin 12.0 - 15.0 g/dL 8.3(L) 9.1(L) 9.7(L)  Hematocrit 36.0 - 46.0 % 27.7(L) 29.4(L) 31.5(L)  Platelets 150 - 400 K/uL 338 361 148(L)    . CMP Latest Ref Rng & Units 10/15/2018 10/14/2018 10/07/2018  Glucose 70 - 99 mg/dL 117(H) 138(H) 100(H)  BUN 8 - 23 mg/dL '13 12 9  ' Creatinine 0.44 - 1.00 mg/dL 0.67 0.86 0.83  Sodium 135 - 145 mmol/L 142 140 142  Potassium 3.5 - 5.1 mmol/L 3.5 3.3(L) 3.3(L)  Chloride 98 - 111 mmol/L 113(H) 111 107  CO2 22 - 32 mmol/L '22 22 24  ' Calcium 8.9 - 10.3 mg/dL 8.2(L) 8.3(L) 8.6(L)  Total Protein 6.5 - 8.1 g/dL - 6.4(L) 6.7  Total Bilirubin 0.3 - 1.2 mg/dL - 0.3 0.2(L)  Alkaline Phos 38 - 126 U/L - 62 96  AST 15 - 41 U/L - 15 12(L)  ALT 0 - 44 U/L - 13 13   Component     Latest Ref Rng & Units 09/23/2018 09/24/2018  Magnesium     1.7 - 2.4 mg/dL 1.2 (L) 2.0  Phosphorus     2.5 - 4.6 mg/dL 2.6   Uric Acid, Serum     2.5 - 7.1 mg/dL 5.1   Vitamin D, 25-Hydroxy     30.0 - 100.0 ng/mL 32.6      RADIOGRAPHIC STUDIES:  No results found.  ASSESSMENT & PLAN:   78 y.o. female with  1. 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 2. Tiny hiatal hernia. 3. Pelvic floor laxity. Aortic Atherosclerosis.  Labs upon initial presentation from1/14/20, WBC normal at 4.3k, HGB at 10.6, PLT normal at 201k  08/06/18 Right Inguinal LN Biopsy which revealed Large B-Cell Lymphoma with a Ki67 of 90%. FISH is negative for rearrangements of BCL2, BCL6, and MYC. 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  08/13/18 Hep B and Hep C negative  08/21/18 ECHO which revealed LV EF of 50-55%  08/22/18 PET/CT revealedProgression, since 73/53/2992, of hypermetabolic abdominopelvic adenopathy, consistent with active lymphoma. 2. CT occult hypermetabolic osseous foci, also most consistent with active lymphoma. 3. No evidence of soft tissue disease above the diaphragm. 4. Focus of hypermetabolism within the anus is likely physiologic. Consider physical exam correlation. 5. Aortic Atherosclerosis.  3. RLE swelling r/o DVT. Could be from lymphedema related to her new/recurrent NHL 1/21/10US Venous RLE to rule out DVT - reviewed - no VTE  4. Hypokalemia resolved K 3.3 on admission  5. Pancytopenia due to chemotherapy and DLBCL  6. Anemia  due to lymphoma and ctx and some epistaxis.  No evidence of bleeding hgb stable.  7. DVT Px - lovenox [place]   8.  Hypocalcemia improved from 5.7 to 8.1 to 8.2  9.  Hypomagnesemia resolved with replacement and up from 1.2 to 2  PLAN: -Discussed pt labwork today, 10/15/18; HGB stable at 8.3, other blood counts and chemistries are stable -The pt has no prohibitive toxicities from continuing C3D2 dose reduced EPOCH-R at this time. Dose reduced for age and previous chemotherapy exposure. -Continue eating well, staying hydrated, and staying as active as reasonably possible  -PO Potassium replacement discontinued - potassium normalized today -lasix held per patient request. -Check vitamin D levels and replace aggressively to maintain 25-hydroxy vitamin D levels more than 60. -Emphasized the importance of eating well and increasing movement throughout treatments.  -Discussed that the patient could use a humidifier, saline spray to use to the nasal areas.  -Will continue watching HGB and will support with PRBC transfusion if necessary for hgb<8. -Monitor electrolytes closely -Miralax and Senna S for vincristine associated constipation with previous treatment. -Will evaluate pt with PET/CT on 10/24/2018 of treatment efficacy. -Outpatient neulasta and Rituxan on 10/21/2018 -Will see the pt in clinic on 10/28/18   The total time spent in the appt was 25 minutes and more than 50% was on counseling and direct patient cares.    Mikey Bussing, Huynh, Taylor Huynh, Taylor Huynh   10/15/2018 8:25 AM    ADDENDUM  .Patient was Personally and independently interviewed, examined and relevant elements of the history of present illness were reviewed in details and an assessment and plan was created. All elements of the patient's history of present illness , assessment and plan were discussed in details with Taylor Huynh. The above documentation reflects our combined findings assessment and plan.  Sullivan Lone MD  MS

## 2018-10-16 ENCOUNTER — Other Ambulatory Visit: Payer: Self-pay

## 2018-10-16 ENCOUNTER — Encounter (HOSPITAL_COMMUNITY): Payer: Self-pay

## 2018-10-16 LAB — CBC
HCT: 25.9 % — ABNORMAL LOW (ref 36.0–46.0)
Hemoglobin: 8 g/dL — ABNORMAL LOW (ref 12.0–15.0)
MCH: 27.9 pg (ref 26.0–34.0)
MCHC: 30.9 g/dL (ref 30.0–36.0)
MCV: 90.2 fL (ref 80.0–100.0)
NRBC: 0 % (ref 0.0–0.2)
Platelets: 326 10*3/uL (ref 150–400)
RBC: 2.87 MIL/uL — AB (ref 3.87–5.11)
RDW: 16.4 % — ABNORMAL HIGH (ref 11.5–15.5)
WBC: 5.3 10*3/uL (ref 4.0–10.5)

## 2018-10-16 LAB — BASIC METABOLIC PANEL
Anion gap: 4 — ABNORMAL LOW (ref 5–15)
BUN: 16 mg/dL (ref 8–23)
CO2: 25 mmol/L (ref 22–32)
Calcium: 8.3 mg/dL — ABNORMAL LOW (ref 8.9–10.3)
Chloride: 115 mmol/L — ABNORMAL HIGH (ref 98–111)
Creatinine, Ser: 0.69 mg/dL (ref 0.44–1.00)
GFR calc Af Amer: >60 mL/min (ref >60)
Glucose, Bld: 119 mg/dL — ABNORMAL HIGH (ref 70–99)
Potassium: 3.3 mmol/L — ABNORMAL LOW (ref 3.5–5.1)
Sodium: 144 mmol/L (ref 135–145)

## 2018-10-16 MED ORDER — POTASSIUM CHLORIDE CRYS ER 20 MEQ PO TBCR
20.0000 meq | EXTENDED_RELEASE_TABLET | Freq: Every day | ORAL | Status: DC
  Administered 2018-10-16 – 2018-10-17 (×2): 20 meq via ORAL
  Filled 2018-10-16 (×2): qty 1

## 2018-10-16 MED ORDER — DIPHENHYDRAMINE HCL 25 MG PO CAPS
25.0000 mg | ORAL_CAPSULE | Freq: Four times a day (QID) | ORAL | Status: DC | PRN
  Administered 2018-10-16: 25 mg via ORAL
  Filled 2018-10-16: qty 1

## 2018-10-16 MED ORDER — SODIUM CHLORIDE 0.9 % IV SOLN
Freq: Once | INTRAVENOUS | Status: AC
  Administered 2018-10-16: 18 mg via INTRAVENOUS
  Filled 2018-10-16: qty 3

## 2018-10-16 MED ORDER — VINCRISTINE SULFATE CHEMO INJECTION 1 MG/ML
Freq: Once | INTRAVENOUS | Status: AC
  Administered 2018-10-16: 12:00:00 via INTRAVENOUS
  Filled 2018-10-16: qty 6

## 2018-10-16 MED ORDER — SENNOSIDES-DOCUSATE SODIUM 8.6-50 MG PO TABS
2.0000 | ORAL_TABLET | Freq: Two times a day (BID) | ORAL | Status: DC
  Administered 2018-10-16: 2 via ORAL
  Filled 2018-10-16: qty 2

## 2018-10-16 NOTE — Progress Notes (Addendum)
HEMATOLOGY/ONCOLOGY INPATIENT PROGRESS NOTE  Date of Service: 10/16/2018  Inpatient Attending: .Brunetta Genera, MD   SUBJECTIVE:   Taylor Huynh is sitting in the recliner chair this morning. The pt reports that she is doing well overall.   The pt reports that she is enjoying some improved energy levels. She notes that she is eating well and has been ambulating periodically. She denies any leg swelling or any other new concerns.  Reports ongoing constipation despite MiraLAX and Senokot.  Lab results today (10/16/18) of CBC w/diff and CMP is as follows: all values are WNL except for RBC at 2.87, HGB at 8.0, HCT at 25.9, RDW 16.4, potassium 3.3, chloride at 115, Glucose at 119, calcium 8.3.  On review of systems, pt reports improved energy levels, reports constipation and denies leg swelling, abdominal pain, and any other symptoms.   OBJECTIVE:  NAD  PHYSICAL EXAMINATION: . Vitals:   10/15/18 0617 10/15/18 1510 10/15/18 2054 10/16/18 0528  BP: (!) 144/83 (!) 143/78 136/71 (!) 150/80  Pulse: 79 86 80 71  Resp: '16 16 15 16  ' Temp: (!) 97.5 F (36.4 C) 98 F (36.7 C) 98.2 F (36.8 C) 97.9 F (36.6 C)  TempSrc: Oral Oral Oral Oral  SpO2: 99% 100% 99% 99%  Weight:      Height:       Filed Weights   10/14/18 0912  Weight: 172 lb 11.2 oz (78.3 kg)   .Body mass index is 29.64 kg/m.  GENERAL:alert, in no acute distress and comfortable SKIN: no acute rashes, no significant lesions EYES: conjunctiva are pink and non-injected, sclera anicteric OROPHARYNX: MMM, no exudate, no oropharyngeal erythema or ulceration NECK: supple, no JVD LYMPH:  no palpable lymphadenopathy in the cervical, axillary or inguinal regions LUNGS: clear to auscultation b/l with normal respiratory effort HEART: regular rate & rhythm ABDOMEN:  normoactive bowel sounds , non tender, not distended. No palpable hepatosplenomegaly.  Extremity: no pedal edema PSYCH: alert & oriented x 3 with  fluent speech NEURO: no focal motor/sensory deficits    MEDICAL HISTORY:  Past Medical History:  Diagnosis Date  . GERD (gastroesophageal reflux disease)   . HH (hiatus hernia) 12/12/2011  . Hypertension   . Leukopenia 12/12/2011  . nhl dx'd 2010  . Seizures ([place]) reports "years ago"  . Syncope 12/12/2011   Hx seizures - not documented - age 27; recent syncope 6/12 and x2, 12/12; negative MRI brain 1/13    SURGICAL HISTORY: Past Surgical History:  Procedure Laterality Date  . COLON SURGERY    . IR IMAGING GUIDED PORT INSERTION  08/23/2018    SOCIAL HISTORY: Social History   Socioeconomic History  . Marital status: Married    Spouse name: Not on file  . Number of children: Not on file  . Years of education: Not on file  . Highest education level: Not on file  Occupational History  . Not on file  Social Needs  . Financial resource strain: Not on file  . Food insecurity:    Worry: Not on file    Inability: Not on file  . Transportation needs:    Medical: Not on file    Non-medical: Not on file  Tobacco Use  . Smoking status: Never Smoker  . Smokeless tobacco: Never Used  Substance and Sexual Activity  . Alcohol use: No  . Drug use: No  . Sexual activity: Not on file  Lifestyle  . Physical activity:    Days per week: Not on  file    Minutes per session: Not on file  . Stress: Not on file  Relationships  . Social connections:    Talks on phone: Not on file    Gets together: Not on file    Attends religious service: Not on file    Active member of club or organization: Not on file    Attends meetings of clubs or organizations: Not on file    Relationship status: Not on file  . Intimate partner violence:    Fear of current or ex partner: Not on file    Emotionally abused: Not on file    Physically abused: Not on file    Forced sexual activity: Not on file  Other Topics Concern  . Not on file  Social History Narrative  . Not on file    FAMILY HISTORY: No  family history on file.  ALLERGIES:  has No Known Allergies.  MEDICATIONS:  Scheduled Meds: . amLODipine  10 mg Oral QODAY  . DOXOrubicin/vinCRIStine/etoposide CHEMO IV infusion for Inpatient CI   Intravenous Once  . DOXOrubicin/vinCRIStine/etoposide CHEMO IV infusion for Inpatient CI   Intravenous Once  . enoxaparin (LOVENOX) injection  40 mg Subcutaneous Q24H  . pantoprazole  40 mg Oral Daily  . polyethylene glycol  17 g Oral Daily  . predniSONE  60 mg Oral QAC breakfast   Continuous Infusions: . sodium chloride 20 mL/hr at 10/15/18 0741  . ondansetron (ZOFRAN) with dexamethasone (DECADRON) IV     PRN Meds:.lidocaine-prilocaine, senna-docusate  REVIEW OF SYSTEMS:    A 10+ POINT REVIEW OF SYSTEMS WAS OBTAINED including neurology, dermatology, psychiatry, cardiac, respiratory, lymph, extremities, GI, GU, Musculoskeletal, constitutional, breasts, reproductive, HEENT.  All pertinent positives are noted in the HPI.  All others are negative.   LABORATORY DATA:  I have reviewed the data as listed  . CBC Latest Ref Rng & Units 10/16/2018 10/15/2018 10/14/2018  WBC 4.0 - 10.5 K/uL 5.3 5.5 6.3  Hemoglobin 12.0 - 15.0 g/dL 8.0(L) 8.3(L) 9.1(L)  Hematocrit 36.0 - 46.0 % 25.9(L) 27.7(L) 29.4(L)  Platelets 150 - 400 K/uL 326 338 361    . CMP Latest Ref Rng & Units 10/16/2018 10/15/2018 10/14/2018  Glucose 70 - 99 mg/dL 119(H) 117(H) 138(H)  BUN 8 - 23 mg/dL '16 13 12  ' Creatinine 0.44 - 1.00 mg/dL 0.69 0.67 0.86  Sodium 135 - 145 mmol/L 144 142 140  Potassium 3.5 - 5.1 mmol/L 3.3(L) 3.5 3.3(L)  Chloride 98 - 111 mmol/L 115(H) 113(H) 111  CO2 22 - 32 mmol/L '25 22 22  ' Calcium 8.9 - 10.3 mg/dL 8.3(L) 8.2(L) 8.3(L)  Total Protein 6.5 - 8.1 g/dL - - 6.4(L)  Total Bilirubin 0.3 - 1.2 mg/dL - - 0.3  Alkaline Phos 38 - 126 U/L - - 62  AST 15 - 41 U/L - - 15  ALT 0 - 44 U/L - - 13   Component     Latest Ref Rng & Units 09/23/2018 09/24/2018  Magnesium     1.7 - 2.4 mg/dL 1.2 (L) 2.0  Phosphorus      2.5 - 4.6 mg/dL 2.6   Uric Acid, Serum     2.5 - 7.1 mg/dL 5.1   Vitamin D, 25-Hydroxy     30.0 - 100.0 ng/mL 32.6     RADIOGRAPHIC STUDIES:  No results found.  ASSESSMENT & PLAN:   78 y.o. female with  1. 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 2. Tiny hiatal hernia. 3. Pelvic floor laxity. Aortic Atherosclerosis.  Labs upon initial presentation from1/14/20, WBC normal at 4.3k, HGB at 10.6, PLT normal at 201k  08/06/18 Right Inguinal LN Biopsy which revealed Large B-Cell Lymphoma with a Ki67 of 90%. FISH is negative for rearrangements of BCL2, BCL6, and MYC. 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  08/13/18 Hep B and Hep C negative  08/21/18 ECHO which revealed LV EF of 50-55%  08/22/18 PET/CT revealedProgression, since 75/17/0017, of hypermetabolic abdominopelvic adenopathy, consistent with active lymphoma. 2. CT occult hypermetabolic osseous foci, also most consistent with active lymphoma. 3. No evidence of soft tissue disease above the diaphragm. 4. Focus of hypermetabolism within the anus is likely physiologic. Consider physical exam correlation. 5. Aortic Atherosclerosis.  3. RLE swelling r/o DVT. Could be from lymphedema related to her new/recurrent NHL 1/21/10US Venous RLE to rule out DVT -  reviewed - no VTE  4. Hypokalemia resolved K 3.3 on admission  5. Pancytopenia due to chemotherapy and DLBCL  6. 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  No evidence of bleeding hgb stable.  7. DVT Px - lovenox [place]   8.  Hypocalcemia improved from 5.7 to 8.1 to 8.2  9.  Hypomagnesemia resolved with replacement and up from 1.2 to 2  PLAN: -Discussed pt labwork today, 10/16/18; HGB stable at 8.0, other blood counts.  Potassium low at 3.3.  Will initiate K-Dur 20 mEq daily. -The pt has no prohibitive toxicities from continuing C3D3 dose reduced EPOCH-R at this time. Dose reduced for age and previous chemotherapy exposure. -Continue eating well, staying hydrated, and staying as active as reasonably possible  -Potassium replacement has been restarted.  K-Dur 20 mEq daily. -lasix held per patient request. -Check vitamin D levels and replace aggressively to maintain 25-hydroxy vitamin D levels more than 60. -Emphasized the importance of eating well and increasing movement throughout treatments.  -Discussed that the patient could use a humidifier, saline spray to use to the nasal areas.  -Will continue watching HGB and will support with PRBC transfusion if necessary for hgb<8. -Monitor electrolytes closely -Miralax and Senna S for vincristine associated constipation with previous treatment.  I have increased Senokot-S to 2 tablets twice a day.  To new MiraLAX. -Will evaluate pt with PET/CT on 10/24/2018 of treatment efficacy. -Outpatient neulasta and Rituxan on 10/21/2018 -Will see the pt in clinic on 10/28/18   The total time spent in the appt was 25 minutes and more than 50% was on counseling and direct patient cares.    Mikey Bussing, DNP, AGPCNP-BC, AOCNP   10/16/2018 9:38 AM   ADDENDUM  .Patient was Personally and independently interviewed, examined and relevant elements of the history of present illness were reviewed in details and an assessment and plan was created.  All elements of the patient's history of present illness , assessment and plan were discussed in details with Kimber Relic DNP. The above documentation reflects our combined findings assessment and plan.  Sullivan Lone MD MS

## 2018-10-16 NOTE — Progress Notes (Signed)
Chemo dosages and dilutions verified by 2 chemo RNs 

## 2018-10-17 LAB — BASIC METABOLIC PANEL
Anion gap: 5 (ref 5–15)
BUN: 15 mg/dL (ref 8–23)
CO2: 25 mmol/L (ref 22–32)
Calcium: 8.4 mg/dL — ABNORMAL LOW (ref 8.9–10.3)
Chloride: 113 mmol/L — ABNORMAL HIGH (ref 98–111)
Creatinine, Ser: 0.63 mg/dL (ref 0.44–1.00)
GFR calc Af Amer: >60 mL/min (ref >60)
GFR calc non Af Amer: >60 mL/min (ref >60)
GLUCOSE: 103 mg/dL — AB (ref 70–99)
Potassium: 3.3 mmol/L — ABNORMAL LOW (ref 3.5–5.1)
Sodium: 143 mmol/L (ref 135–145)

## 2018-10-17 LAB — CBC
HCT: 26.6 % — ABNORMAL LOW (ref 36.0–46.0)
HEMOGLOBIN: 7.9 g/dL — AB (ref 12.0–15.0)
MCH: 27.1 pg (ref 26.0–34.0)
MCHC: 29.7 g/dL — ABNORMAL LOW (ref 30.0–36.0)
MCV: 91.4 fL (ref 80.0–100.0)
Platelets: 349 10*3/uL (ref 150–400)
RBC: 2.91 MIL/uL — ABNORMAL LOW (ref 3.87–5.11)
RDW: 16.4 % — ABNORMAL HIGH (ref 11.5–15.5)
WBC: 3.4 10*3/uL — ABNORMAL LOW (ref 4.0–10.5)
nRBC: 0 % (ref 0.0–0.2)

## 2018-10-17 MED ORDER — SENNOSIDES-DOCUSATE SODIUM 8.6-50 MG PO TABS: 2.0000 | ORAL_TABLET | Freq: Two times a day (BID) | ORAL | Status: DC | PRN

## 2018-10-17 MED ORDER — SODIUM CHLORIDE 0.9 % IV SOLN
Freq: Once | INTRAVENOUS | Status: AC
  Administered 2018-10-18: 36 mg via INTRAVENOUS
  Filled 2018-10-17: qty 8

## 2018-10-17 MED ORDER — VINCRISTINE SULFATE CHEMO INJECTION 1 MG/ML
Freq: Once | INTRAVENOUS | Status: AC
  Administered 2018-10-17: 12:00:00 via INTRAVENOUS
  Filled 2018-10-17: qty 6

## 2018-10-17 MED ORDER — SODIUM CHLORIDE 0.9 % IV SOLN
400.0000 mg/m2 | Freq: Once | INTRAVENOUS | Status: AC
  Administered 2018-10-18: 780 mg via INTRAVENOUS
  Filled 2018-10-17: qty 39

## 2018-10-17 MED ORDER — SODIUM CHLORIDE 0.9 % IV SOLN
Freq: Once | INTRAVENOUS | Status: AC
  Administered 2018-10-17: 18 mg via INTRAVENOUS
  Filled 2018-10-17: qty 4

## 2018-10-17 MED ORDER — POTASSIUM CHLORIDE CRYS ER 20 MEQ PO TBCR
20.0000 meq | EXTENDED_RELEASE_TABLET | Freq: Two times a day (BID) | ORAL | Status: DC
  Administered 2018-10-17 – 2018-10-18 (×2): 20 meq via ORAL
  Filled 2018-10-17 (×2): qty 1

## 2018-10-17 NOTE — Care Management Important Message (Signed)
Important Message  Patient Details  Name: Taylor Huynh MRN: [id number] Date of Birth: 10/03/1940   Medicare Important Message Given:  Yes    [name] 10/17/2018, 10:18 AMImportant Message  Patient Details  Name: Taylor Huynh MRN: [id number] Date of Birth: 09-17-1940   Medicare Important Message Given:  Yes    [name] 10/17/2018, 10:18 AM

## 2018-10-17 NOTE — Progress Notes (Addendum)
HEMATOLOGY/ONCOLOGY INPATIENT PROGRESS NOTE  Date of Service: 10/17/2018  Inpatient Attending: .Brunetta Genera, MD   SUBJECTIVE:   Taylor Huynh is sitting in the recliner chair this morning. The pt reports that she is doing well overall.   The pt reports that she is enjoying some improved energy levels. She notes that she is eating well and has been ambulating periodically. [place] moving. Reports itching yesterday. Thinks it is from her Senokot.  Lab results today (10/17/18) of CBC w/diff and CMP is as follows: all values are WNL except WBC 3.4, for RBC at 2.91, HGB at 7.9, HCT at 26.6, MCHC 29.7, RDW 16.4, potassium 3.3, chloride at 113, Glucose at 103, calcium 8.4.  On review of systems, pt reports improved energy levels, constipation better, itching that has resolved, and denies leg swelling, abdominal pain, and any other symptoms.   OBJECTIVE:  NAD  PHYSICAL EXAMINATION: . Vitals:   10/16/18 0528 10/16/18 1600 10/16/18 2058 10/17/18 0520  BP: (!) 150/80 (!) 154/87 (!) 168/69 (!) 150/61  Pulse: 71 75 68 60  Resp: '16 17 16 17  ' Temp: 97.9 F (36.6 C) (!) 97.4 F (36.3 C) 98.2 F (36.8 C) 98 F (36.7 C)  TempSrc: Oral Oral Oral Oral  SpO2: 99% 99% 98% 98%  Weight:      Height:       Filed Weights   10/14/18 0912  Weight: 172 lb 11.2 oz (78.3 kg)   .Body mass index is 29.64 kg/m.  GENERAL:alert, in no acute distress and comfortable SKIN: no acute rashes, no significant lesions EYES: conjunctiva are pink and non-injected, sclera anicteric OROPHARYNX: MMM, no exudate, no oropharyngeal erythema or ulceration NECK: supple, no JVD LYMPH:  no palpable lymphadenopathy in the cervical, axillary or inguinal regions LUNGS: clear to auscultation b/l with normal respiratory effort HEART: regular rate & rhythm ABDOMEN:  normoactive bowel sounds , non tender, not distended. No palpable hepatosplenomegaly.  Extremity: no pedal edema PSYCH: alert & oriented x 3  with fluent speech NEURO: no focal motor/sensory deficits    MEDICAL HISTORY:  Past Medical History:  Diagnosis Date  . GERD (gastroesophageal reflux disease)   . HH (hiatus hernia) 12/12/2011  . Hypertension   . Leukopenia 12/12/2011  . nhl dx'd 2010  . Seizures ([place]) reports "years ago"  . Syncope 12/12/2011   Hx seizures - not documented - age 77; recent syncope 6/12 and x2, 12/12; negative MRI brain 1/13    SURGICAL HISTORY: Past Surgical History:  Procedure Laterality Date  . COLON SURGERY    . IR IMAGING GUIDED PORT INSERTION  08/23/2018    SOCIAL HISTORY: Social History   Socioeconomic History  . Marital status: Married    Spouse name: Not on file  . Number of children: Not on file  . Years of education: Not on file  . Highest education level: Not on file  Occupational History  . Not on file  Social Needs  . Financial resource strain: Not on file  . Food insecurity:    Worry: Not on file    Inability: Not on file  . Transportation needs:    Medical: Not on file    Non-medical: Not on file  Tobacco Use  . Smoking status: Never Smoker  . Smokeless tobacco: Never Used  Substance and Sexual Activity  . Alcohol use: No  . Drug use: No  . Sexual activity: Not on file  Lifestyle  . Physical activity:    Days per week:  Not on file    Minutes per session: Not on file  . Stress: Not on file  Relationships  . Social connections:    Talks on phone: Not on file    Gets together: Not on file    Attends religious service: Not on file    Active member of club or organization: Not on file    Attends meetings of clubs or organizations: Not on file    Relationship status: Not on file  . Intimate partner violence:    Fear of current or ex partner: Not on file    Emotionally abused: Not on file    Physically abused: Not on file    Forced sexual activity: Not on file  Other Topics Concern  . Not on file  Social History Narrative  . Not on file    FAMILY HISTORY:  History reviewed. No pertinent family history.  ALLERGIES:  has No Known Allergies.  MEDICATIONS:  Scheduled Meds: . amLODipine  10 mg Oral QODAY  . DOXOrubicin/vinCRIStine/etoposide CHEMO IV infusion for Inpatient CI   Intravenous Once  . enoxaparin (LOVENOX) injection  40 mg Subcutaneous Q24H  . pantoprazole  40 mg Oral Daily  . polyethylene glycol  17 g Oral Daily  . potassium chloride  20 mEq Oral Daily  . predniSONE  60 mg Oral QAC breakfast  . senna-docusate  2 tablet Oral BID   Continuous Infusions: . sodium chloride 20 mL/hr at 10/16/18 1232   PRN Meds:.diphenhydrAMINE, lidocaine-prilocaine  REVIEW OF SYSTEMS:    A 10+ POINT REVIEW OF SYSTEMS WAS OBTAINED including neurology, dermatology, psychiatry, cardiac, respiratory, lymph, extremities, GI, GU, Musculoskeletal, constitutional, breasts, reproductive, HEENT.  All pertinent positives are noted in the HPI.  All others are negative.   LABORATORY DATA:  I have reviewed the data as listed  . CBC Latest Ref Rng & Units 10/17/2018 10/16/2018 10/15/2018  WBC 4.0 - 10.5 K/uL 3.4(L) 5.3 5.5  Hemoglobin 12.0 - 15.0 g/dL 7.9(L) 8.0(L) 8.3(L)  Hematocrit 36.0 - 46.0 % 26.6(L) 25.9(L) 27.7(L)  Platelets 150 - 400 K/uL 349 326 338    . CMP Latest Ref Rng & Units 10/17/2018 10/16/2018 10/15/2018  Glucose 70 - 99 mg/dL 103(H) 119(H) 117(H)  BUN 8 - 23 mg/dL '15 16 13  ' Creatinine 0.44 - 1.00 mg/dL 0.63 0.69 0.67  Sodium 135 - 145 mmol/L 143 144 142  Potassium 3.5 - 5.1 mmol/L 3.3(L) 3.3(L) 3.5  Chloride 98 - 111 mmol/L 113(H) 115(H) 113(H)  CO2 22 - 32 mmol/L '25 25 22  ' Calcium 8.9 - 10.3 mg/dL 8.4(L) 8.3(L) 8.2(L)  Total Protein 6.5 - 8.1 g/dL - - -  Total Bilirubin 0.3 - 1.2 mg/dL - - -  Alkaline Phos 38 - 126 U/L - - -  AST 15 - 41 U/L - - -  ALT 0 - 44 U/L - - -   Component     Latest Ref Rng & Units 09/23/2018 09/24/2018  Magnesium     1.7 - 2.4 mg/dL 1.2 (L) 2.0  Phosphorus     2.5 - 4.6 mg/dL 2.6   Uric Acid, Serum      2.5 - 7.1 mg/dL 5.1   Vitamin D, 25-Hydroxy     30.0 - 100.0 ng/mL 32.6     RADIOGRAPHIC STUDIES:  No results found.  ASSESSMENT & PLAN:   78 y.o. female with  1. 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 2. Tiny hiatal hernia. 3. Pelvic floor laxity. Aortic Atherosclerosis.  Labs upon initial presentation from1/14/20, WBC normal at 4.3k, HGB at 10.6, PLT normal at 201k  08/06/18 Right Inguinal LN Biopsy which revealed Large B-Cell Lymphoma with a Ki67 of 90%. FISH is negative for rearrangements of BCL2, BCL6, and MYC. 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  08/13/18 Hep B and Hep C negative  08/21/18 ECHO which revealed LV EF of 50-55%  08/22/18 PET/CT revealedProgression, since 92/33/0076, of hypermetabolic abdominopelvic adenopathy, consistent with active lymphoma. 2. CT occult hypermetabolic osseous foci, also most consistent with active lymphoma. 3. No evidence of soft tissue disease above the diaphragm. 4. Focus of hypermetabolism within the anus is likely physiologic. Consider physical exam correlation. 5. Aortic Atherosclerosis.  3. RLE swelling r/o DVT. Could be from lymphedema related to her new/recurrent NHL 1/21/10US Venous RLE to rule out DVT - reviewed - no VTE  4. Hypokalemia resolved K 3.3 on  admission  5. Pancytopenia due to chemotherapy and DLBCL  6. 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  No evidence of bleeding hgb stable.  7. DVT Px - lovenox [place]   8.  Hypocalcemia improved from 5.7 to 8.1 to 8.4  9.  Hypomagnesemia resolved with replacement and up from 1.2 to 2  PLAN: -Discussed pt labwork today, 10/17/18; HGB stable at 7.9, other blood counts stable.  Potassium low at 3.3.  Will change K-Dur to 20 mEq twice a day. -The pt has no prohibitive toxicities from continuing C3D4 dose reduced EPOCH-R at this time. Dose reduced for age and previous chemotherapy exposure. -Continue eating well, staying hydrated, and staying as active as reasonably possible  -Potassium replacement has been increased to 20 mEq twice a day. -lasix held per patient request. -Check vitamin D levels and replace aggressively to maintain 25-hydroxy vitamin D levels more than 60. -Emphasized the importance of eating well and increasing movement throughout treatments.  -Discussed that the patient could use a humidifier, saline spray to use to the nasal areas.  -Will continue watching HGB and will support with PRBC transfusion if necessary for hgb<8.  No transfusion has been ordered today due to shortage and policy to transfuse only if hemoglobin is less than 7.0 actively bleeding. -Monitor electrolytes closely -Miralax and Senna S for vincristine associated constipation with previous treatment.  Continue MiraLAX.  The patient thinks her itching is related to the Senokot-S and I have changed this to as needed. -Will evaluate pt with PET/CT on 10/24/2018 of treatment efficacy. -Outpatient neulasta and Rituxan on 10/21/2018 -Will see the pt in clinic on 10/28/18   The total time spent in the appt was 25 minutes and more than 50% was on counseling and direct patient cares.    Mikey Bussing, DNP, AGPCNP-BC, AOCNP   10/17/2018 8:49 AM   ADDENDUM  .Patient was Personally and independently  interviewed, examined and relevant elements of the history of present illness were reviewed in details and an assessment and plan was created. All elements of the  patient's history of present illness , assessment and plan were discussed in details with Mikey Bussing DNP. The above documentation reflects our combined findings assessment and plan.  Sullivan Lone MD MS

## 2018-10-17 NOTE — Progress Notes (Signed)
Ok to continue [facility] treatment Day 4 with Hg - 7.9 today per MD.  Hardie Pulley, PharmD, BCPS, BCOP

## 2018-10-18 DIAGNOSIS — E876 Hypokalemia: Secondary | ICD-10-CM

## 2018-10-18 LAB — BASIC METABOLIC PANEL
Anion gap: 7 (ref 5–15)
BUN: 15 mg/dL (ref 8–23)
CO2: 26 mmol/L (ref 22–32)
Calcium: 8.6 mg/dL — ABNORMAL LOW (ref 8.9–10.3)
Chloride: 109 mmol/L (ref 98–111)
Creatinine, Ser: 0.71 mg/dL (ref 0.44–1.00)
GFR calc Af Amer: >60 mL/min (ref >60)
Glucose, Bld: 94 mg/dL (ref 70–99)
POTASSIUM: 3.2 mmol/L — AB (ref 3.5–5.1)
Sodium: 142 mmol/L (ref 135–145)

## 2018-10-18 LAB — CBC
HCT: 28 % — ABNORMAL LOW (ref 36.0–46.0)
HEMOGLOBIN: 8.6 g/dL — AB (ref 12.0–15.0)
MCH: 27.4 pg (ref 26.0–34.0)
MCHC: 30.7 g/dL (ref 30.0–36.0)
MCV: 89.2 fL (ref 80.0–100.0)
Platelets: 376 10*3/uL (ref 150–400)
RBC: 3.14 MIL/uL — ABNORMAL LOW (ref 3.87–5.11)
RDW: 15.6 % — ABNORMAL HIGH (ref 11.5–15.5)
WBC: 2.3 10*3/uL — ABNORMAL LOW (ref 4.0–10.5)
nRBC: 0 % (ref 0.0–0.2)

## 2018-10-18 MED ORDER — HEPARIN SOD (PORK) LOCK FLUSH 100 UNIT/ML IV SOLN
500.0000 [IU] | Freq: Once | INTRAVENOUS | Status: AC
  Administered 2018-10-18: 500 [IU] via INTRAVENOUS
  Filled 2018-10-18: qty 5

## 2018-10-18 MED ORDER — POTASSIUM CHLORIDE CRYS ER 20 MEQ PO TBCR: 20.0000 meq | EXTENDED_RELEASE_TABLET | Freq: Two times a day (BID) | ORAL | 0 refills | Status: DC

## 2018-10-18 MED ORDER — POLYETHYLENE GLYCOL 3350 17 G PO PACK: 17.0000 g | PACK | Freq: Every day | ORAL | 0 refills | Status: DC | PRN

## 2018-10-18 NOTE — Progress Notes (Signed)
Chemo dosages and calculations verified by 2 chemo RNs 

## 2018-10-18 NOTE — Discharge Summary (Addendum)
Physician Discharge Summary  Patient ID: Taylor Huynh MRN: [id number] DOB/AGE: [birth year] 78 y.o.  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 Diffuse large B cell lymphoma ([place])  Allergies as of 10/18/2018   No Known Allergies     Medication List    TAKE these medications   amLODipine 10 MG tablet Commonly known as:  NORVASC Take 10 mg by mouth every other day.   cholecalciferol 1000 units tablet Commonly known as:  VITAMIN D Take 1,000 Units by mouth daily.   lidocaine-prilocaine cream Commonly known as:  EMLA Apply 1 application topically as needed. What changed:  reasons to take this   Melatonin 5 MG Tabs Take 5 mg by mouth at bedtime.   omeprazole 20 MG capsule Commonly known as:  PRILOSEC Take 20 mg by mouth daily.   polyethylene glycol packet Commonly known as:  MIRALAX / GLYCOLAX Take 17 g by mouth daily as needed. What changed:    when to take this  reasons to take this   potassium chloride SA 20 MEQ tablet Commonly known as:  K-DUR,KLOR-CON Take 1 tablet (20 mEq total) by mouth 2 (two) times daily.   senna-docusate 8.6-50 MG tablet Commonly known as:  Senokot-S Take 2 tablets by mouth at bedtime as needed for mild constipation.   vitamin B-12 1000 MCG tablet Commonly known as:  CYANOCOBALAMIN Take 1,000 mcg by mouth every other day.       Discharge Instructions    Call MD for:  extreme fatigue   Complete by:  As directed    Call MD for:  persistant nausea and vomiting   Complete by:  As directed    Call MD for:  temperature >100.4   Complete by:  As directed    Diet general   Complete by:  As directed    Increase potassium in diet.   Increase activity slowly   Complete by:  As directed      Disposition:  -Outpatient Neulasta and Rituxan on 10/21/2018 -PET  scan on 10/24/2018 -Follow-up with Dr. Irene Limbo on 10/28/2018 for toxicity and nadir check.  Discharge laboratory values: CBC    Component Value Date/Time   WBC 2.3 (L) 10/18/2018 0500   RBC 3.14 (L) 10/18/2018 0500   HGB 8.6 (L) 10/18/2018 0500   HGB 9.4 (L) 09/09/2018 0803   HGB 12.1 09/22/2013 1222   HCT 28.0 (L) 10/18/2018 0500   HCT 37.3 09/22/2013 1222   PLT 376 10/18/2018 0500   PLT 101 (L) 09/09/2018 0803   PLT 248 09/22/2013 1222   MCV 89.2 10/18/2018 0500   MCV 88.0 09/22/2013 1222   MCH 27.4 10/18/2018 0500   MCHC 30.7 10/18/2018 0500   RDW 15.6 (H) 10/18/2018 0500   RDW 13.6 09/22/2013 1222   LYMPHSABS 0.5 (L) 10/14/2018 1630   LYMPHSABS 1.4 09/22/2013 1222   MONOABS 0.1 10/14/2018 1630   MONOABS 0.3 09/22/2013 1222   EOSABS 0.0 10/14/2018 1630   EOSABS 0.1 09/22/2013 1222   BASOSABS 0.0 10/14/2018 1630   BASOSABS 0.0 09/22/2013 1222   CMP Latest Ref Rng & Units 10/18/2018 10/17/2018 10/16/2018  Glucose 70 - 99 mg/dL 94 103(H) 119(H)  BUN 8 - 23 mg/dL _0 Creatinine 0.44 - 1.00 mg/dL 0.71 0.63 0.69  Sodium 135 - 145 mmol/L 142 143 144  Potassium 3.5 - 5.1  mmol/L 3.2(L) 3.3(L) 3.3(L)  Chloride 98 - 111 mmol/L 109 113(H) 115(H)  CO2 22 - 32 mmol/L _0 Calcium 8.9 - 10.3 mg/dL 8.6(L) 8.4(L) 8.3(L)  Total Protein 6.5 - 8.1 g/dL - - -  Total Bilirubin 0.3 - 1.2 mg/dL - - -  Alkaline Phos 38 - 126 U/L - - -  AST 15 - 41 U/L - - -  ALT 0 - 44 U/L - - -   Brief H&P: Taylor Huynh is a wonderful 78 y.o. 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 The pt reports that she is doing well overall.   The pt reports that she has not developed any new concerns in the interim, nor concerns for infections. She denies leg swelling, fevers, chills or night sweats.  Lab results today (10/14/18) of CBC w/diff and CMP reviewed.  Labs within normal limits except for RBC 3.25, hemoglobin 9.1, hematocrit  29.4, RDW 16.3, potassium 3.3, glucose 138, calcium 8.3, total protein 6.4  On review of systems, pt reports stable energy levels, and denies fevers, chills, night sweats, leg swelling, concerns for infections, abdominal pains, or any other symptoms.  Issues during hospitalization 1. 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 Newly Diagnosed Recurrent/New High Grade B-Cell Non-Hodgkin's Lymphoma, Stage IV FISH negative for findings of double hit lymphoma  07/18/18 CT A/P revealed Extensive abdominal-pelvic adenopathy, consistent with recurrent lymphoma. 2. Tiny hiatal hernia. 3. Pelvic floor laxity. Aortic Atherosclerosis.  Labs upon initial presentation from1/14/20, WBC normal at 4.3k, HGB at 10.6, PLT normal at 201k  08/06/18 Right Inguinal LN Biopsy which revealed Large B-Cell Lymphoma with a Ki67 of 90%. FISH is negative for rearrangements of BCL2, BCL6, and MYC. 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  08/13/18 Hep B and Hep C negative  08/21/18 ECHO which revealed LV EF of 50-55%  08/22/18 PET/CT revealedProgression, since 13/02/6577, of hypermetabolic abdominopelvic adenopathy, consistent with active lymphoma. 2. CT occult hypermetabolic osseous foci, also most consistent with active lymphoma. 3. No evidence of soft tissue disease above the diaphragm. 4. Focus of hypermetabolism within the anus is likely physiologic. Consider physical exam correlation. 5. Aortic Atherosclerosis.  3. RLE swelling r/o DVT. Could be from lymphedema related to her  new/recurrent NHL 1/21/10US Venous RLE to rule out DVT - reviewed - no VTE  4. Hypokalemia resolved K 3.6 today.  5. Pancytopenia due to chemotherapy and DLBCL PLAN: -Discussed pt labwork today,10/18/2078, hemoglobin is stable at 8.6, total white blood cell count slightly low at 2.3, potassium is low at 3.2. -The pt has no prohibitive toxicities from continuingC3D5 dose reduced EPOCH-Rat this time. Dose reduced for age and previous chemotherapy exposure -Continue eating well, staying hydrated, and staying as active as reasonably possible -Will continue watching HGB, and will support with PRBC transfusion if necessary for hgb<8. Pt denies light headedness, fatigue, and dizziness at this time.  -Miralax and Senokot as needed as an outpatient. -Outpatient Neulasta and Rituxan on 10/21/2018. -Will gauge indications for larger doses pending her display of tolerance  -Will evaluate pt with PET/CT  after cycle 3.  This is already scheduled for 10/24/2018.  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 No evidence of bleeding  6. DVT Px - lovenox [place]  BP (!) 172/75 (BP Location: Right Arm)   Pulse 60   Temp 98 F (36.7 C) (Oral)   Resp 17   Ht 5' 4" (1.626 m)   Wt 172 lb 11.2 oz (78.3 kg)   LMP  (LMP Unknown)   SpO2 98%   BMI 29.64 kg/m   GENERAL:alert, in no acute distress and comfortable SKIN: no acute rashes, no significant lesions EYES: conjunctiva are pink and non-injected, sclera anicteric OROPHARYNX: MMM, no exudates, no oropharyngeal erythema or ulceration NECK: supple, no JVD LYMPH:  no palpable lymphadenopathy in the cervical, axillary or inguinal regions LUNGS: clear to auscultation b/l with normal respiratory effort HEART: regular rate & rhythm ABDOMEN:  normoactive bowel sounds , non tender, not distended. Extremity: no pedal edema PSYCH: alert & oriented x 3 with fluent speech NEURO: no focal motor/sensory deficits  Hospital  Course: Active Problems:  Diffuse Large B cell lymphoma ([place]) Encounter for antineoplastic chemotherapy  Diet: Regular diet  Activity: Infection precautions as directed  Condition at discharge: Stable  Signed: Mikey Bussing 10/18/2018, 10:15 AM   ADDENDUM  .Patient was Personally and independently interviewed, examined and relevant elements of the history of present illness were reviewed in details and an assessment and plan was created. All elements of the patient's history of present illness , assessment and plan were discussed in details with Mikey Bussing DNP. The above documentation reflects our combined findings assessment and plan.  Sullivan Lone MD MS TT spent discharging patient>47mns

## 2018-10-18 NOTE — Progress Notes (Signed)
Called to Pt room apx 0500 Pt stating her shirt was wet, upon assessment port had come out of port site, Chemo stopped at that time apx 0500 Chemo spill protocol followed. AC notified, Chemo nurse notified, Unit Director notified. Port reaccessed apx [phone] by Adline Peals, RN. Chemo restarted Apx 0650.

## 2018-10-21 ENCOUNTER — Inpatient Hospital Stay: Payer: Medicare Other

## 2018-10-21 ENCOUNTER — Other Ambulatory Visit: Payer: Self-pay

## 2018-10-21 VITALS — BP 149/83 | HR 96 | Temp 99.2°F | Resp 18

## 2018-10-21 DIAGNOSIS — I10 Essential (primary) hypertension: Secondary | ICD-10-CM | POA: Diagnosis not present

## 2018-10-21 DIAGNOSIS — K219 Gastro-esophageal reflux disease without esophagitis: Secondary | ICD-10-CM | POA: Diagnosis not present

## 2018-10-21 DIAGNOSIS — C8338 Diffuse large B-cell lymphoma, lymph nodes of multiple sites: Secondary | ICD-10-CM | POA: Diagnosis not present

## 2018-10-21 DIAGNOSIS — Z7189 Other specified counseling: Secondary | ICD-10-CM

## 2018-10-21 DIAGNOSIS — G40909 Epilepsy, unspecified, not intractable, without status epilepticus: Secondary | ICD-10-CM | POA: Diagnosis not present

## 2018-10-21 DIAGNOSIS — K449 Diaphragmatic hernia without obstruction or gangrene: Secondary | ICD-10-CM | POA: Diagnosis not present

## 2018-10-21 DIAGNOSIS — M7989 Other specified soft tissue disorders: Secondary | ICD-10-CM | POA: Diagnosis not present

## 2018-10-21 DIAGNOSIS — D61810 Antineoplastic chemotherapy induced pancytopenia: Secondary | ICD-10-CM | POA: Diagnosis not present

## 2018-10-21 DIAGNOSIS — Z5112 Encounter for antineoplastic immunotherapy: Secondary | ICD-10-CM | POA: Diagnosis not present

## 2018-10-21 DIAGNOSIS — I700 Atherosclerosis of aorta: Secondary | ICD-10-CM | POA: Diagnosis not present

## 2018-10-21 DIAGNOSIS — Z7689 Persons encountering health services in other specified circumstances: Secondary | ICD-10-CM | POA: Diagnosis not present

## 2018-10-21 DIAGNOSIS — E538 Deficiency of other specified B group vitamins: Secondary | ICD-10-CM | POA: Diagnosis not present

## 2018-10-21 MED ORDER — SODIUM CHLORIDE 0.9 % IV SOLN
Freq: Once | INTRAVENOUS | Status: AC
  Administered 2018-10-21: 08:00:00 via INTRAVENOUS
  Filled 2018-10-21: qty 250

## 2018-10-21 MED ORDER — SODIUM CHLORIDE 0.9 % IV SOLN
375.0000 mg/m2 | Freq: Once | INTRAVENOUS | Status: AC
  Administered 2018-10-21: 700 mg via INTRAVENOUS
  Filled 2018-10-21: qty 50

## 2018-10-21 MED ORDER — DIPHENHYDRAMINE HCL 25 MG PO CAPS
50.0000 mg | ORAL_CAPSULE | Freq: Once | ORAL | Status: AC
  Administered 2018-10-21: 50 mg via ORAL

## 2018-10-21 MED ORDER — HEPARIN SOD (PORK) LOCK FLUSH 100 UNIT/ML IV SOLN
500.0000 [IU] | Freq: Once | INTRAVENOUS | Status: AC | PRN
  Administered 2018-10-21: 500 [IU]
  Filled 2018-10-21: qty 5

## 2018-10-21 MED ORDER — ACETAMINOPHEN 325 MG PO TABS
650.0000 mg | ORAL_TABLET | Freq: Once | ORAL | Status: AC
  Administered 2018-10-21: 650 mg via ORAL

## 2018-10-21 MED ORDER — ACETAMINOPHEN 325 MG PO TABS
ORAL_TABLET | ORAL | Status: AC
  Filled 2018-10-21: qty 2

## 2018-10-21 MED ORDER — SODIUM CHLORIDE 0.9% FLUSH
10.0000 mL | INTRAVENOUS | Status: DC | PRN
  Administered 2018-10-21: 10 mL
  Filled 2018-10-21: qty 10

## 2018-10-21 MED ORDER — PEGFILGRASTIM INJECTION 6 MG/0.6ML ~~LOC~~
6.0000 mg | PREFILLED_SYRINGE | Freq: Once | SUBCUTANEOUS | Status: AC
  Administered 2018-10-21: 6 mg via SUBCUTANEOUS

## 2018-10-21 MED ORDER — DIPHENHYDRAMINE HCL 25 MG PO CAPS
ORAL_CAPSULE | ORAL | Status: AC
  Filled 2018-10-21: qty 2

## 2018-10-21 MED ORDER — PEGFILGRASTIM INJECTION 6 MG/0.6ML ~~LOC~~
PREFILLED_SYRINGE | SUBCUTANEOUS | Status: AC
  Filled 2018-10-21: qty 0.6

## 2018-10-21 NOTE — Patient Instructions (Signed)
[facility] Discharge Instructions for Patients Receiving Chemotherapy  Today you received the following chemotherapy agents: rituxin  To help prevent nausea and vomiting after your treatment, we encourage you to take your nausea medicationAs directedIf you develop nausea and vomiting that is not controlled by your nausea medication, call the clinic.   BELOW ARE SYMPTOMS THAT SHOULD BE REPORTED IMMEDIATELY:  *FEVER GREATER THAN 100.5 F  *CHILLS WITH OR WITHOUT FEVER  NAUSEA AND VOMITING THAT IS NOT CONTROLLED WITH YOUR NAUSEA MEDICATION  *UNUSUAL SHORTNESS OF BREATH  *UNUSUAL BRUISING OR BLEEDING  TENDERNESS IN MOUTH AND THROAT WITH OR WITHOUT PRESENCE OF ULCERS  *URINARY PROBLEMS  *BOWEL PROBLEMS  UNUSUAL RASH Items with * indicate a potential emergency and should be followed up as soon as possible.  Feel free to call the clinic should you have any questions or concerns. The clinic phone number is [phone].  Please show the [facility] at check-in to the Emergency Department and triage nurse.   Rituximab injection What is this medicine? RITUXIMAB (ri TUX i mab) is a monoclonal antibody. It is used to treat certain types of cancer like non-Hodgkin lymphoma and chronic lymphocytic leukemia. It is also used to treat rheumatoid arthritis, granulomatosis with polyangiitis (or Wegener's granulomatosis), microscopic polyangiitis, and pemphigus vulgaris. This medicine may be used for other purposes; ask your health care provider or pharmacist if you have questions. COMMON BRAND NAME(S): Rituxan What should I tell my health care provider before I take this medicine? They need to know if you have any of these conditions: -heart disease -infection (especially a virus infection such as hepatitis B, chickenpox, cold sores, or herpes) -immune system problems -irregular heartbeat -kidney disease -low blood counts, like low white cell, platelet, or red cell  counts -lung or breathing disease, like asthma -recently received or scheduled to receive a vaccine -an unusual or allergic reaction to rituximab, other medicines, foods, dyes, or preservatives -pregnant or trying to get pregnant -breast-feeding How should I use this medicine? This medicine is for infusion into a vein. It is administered in a hospital or clinic by a specially trained health care professional. A special MedGuide will be given to you by the pharmacist with each prescription and refill. Be sure to read this information carefully each time. Talk to your pediatrician regarding the use of this medicine in children. This medicine is not approved for use in children. Overdosage: If you think you have taken too much of this medicine contact a poison control center or emergency room at once. NOTE: This medicine is only for you. Do not share this medicine with others. What if I miss a dose? It is important not to miss a dose. Call your doctor or health care professional if you are unable to keep an appointment. What may interact with this medicine? -cisplatin -live virus vaccines This list may not describe all possible interactions. Give your health care provider a list of all the medicines, herbs, non-prescription drugs, or dietary supplements you use. Also tell them if you smoke, drink alcohol, or use illegal drugs. Some items may interact with your medicine. What should I watch for while using this medicine? Your condition will be monitored carefully while you are receiving this medicine. You may need blood work done while you are taking this medicine. This medicine can cause serious allergic reactions. To reduce your risk you may need to take medicine before treatment with this medicine. Take your medicine as directed. In some patients,  this medicine may cause a serious brain infection that may cause death. If you have any problems seeing, thinking, speaking, walking, or standing, tell  your healthcare professional right away. If you cannot reach your healthcare professional, urgently seek other source of medical care. Call your doctor or health care professional for advice if you get a fever, chills or sore throat, or other symptoms of a cold or flu. Do not treat yourself. This drug decreases your body's ability to fight infections. Try to avoid being around people who are sick. Do not become pregnant while taking this medicine or for 12 months after stopping it. Women should inform their doctor if they wish to become pregnant or think they might be pregnant. There is a potential for serious side effects to an unborn child. Talk to your health care professional or pharmacist for more information. Do not breast-feed an infant while taking this medicine or for 6 months after stopping it. What side effects may I notice from receiving this medicine? Side effects that you should report to your doctor or health care professional as soon as possible: -allergic reactions like skin rash, itching or hives; swelling of the face, lips, or tongue -breathing problems -chest pain -changes in vision -diarrhea -headache with fever, neck stiffness, sensitivity to light, nausea, or confusion -fast, irregular heartbeat -loss of memory -low blood counts - this medicine may decrease the number of white blood cells, red blood cells and platelets. You may be at increased risk for infections and bleeding. -mouth sores -problems with balance, talking, or walking -redness, blistering, peeling or loosening of the skin, including inside the mouth -signs of infection - fever or chills, cough, sore throat, pain or difficulty passing urine -signs and symptoms of kidney injury like trouble passing urine or change in the amount of urine -signs and symptoms of liver injury like dark yellow or brown urine; general ill feeling or flu-like symptoms; light-colored stools; loss of appetite; nausea; right upper belly  pain; unusually weak or tired; yellowing of the eyes or skin -signs and symptoms of low blood pressure like dizziness; feeling faint or lightheaded, falls; unusually weak or tired -stomach pain -swelling of the ankles, feet, hands -unusual bleeding or bruising -vomiting Side effects that usually do not require medical attention (report to your doctor or health care professional if they continue or are bothersome): -headache -joint pain -muscle cramps or muscle pain -nausea -tiredness This list may not describe all possible side effects. Call your doctor for medical advice about side effects. You may report side effects to FDA at 1-800-FDA-1088. Where should I keep my medicine? This drug is given in a hospital or clinic and will not be stored at home. NOTE: This sheet is a summary. It may not cover all possible information. If you have questions about this medicine, talk to your doctor, pharmacist, or health care provider.  2019 [employer]vier/Gold Standard (2017-06-22 13:04:32)

## 2018-10-24 ENCOUNTER — Other Ambulatory Visit: Payer: Self-pay

## 2018-10-24 ENCOUNTER — Encounter (HOSPITAL_COMMUNITY)
Admission: RE | Admit: 2018-10-24 | Discharge: 2018-10-24 | Disposition: A | Discharge status: Home / Self Care | Payer: Medicare Other | Source: Ambulatory Visit | Attending: Hematology | Admitting: Hematology

## 2018-10-24 DIAGNOSIS — C8338 Diffuse large B-cell lymphoma, lymph nodes of multiple sites: Secondary | ICD-10-CM | POA: Insufficient documentation

## 2018-10-24 DIAGNOSIS — I2510 Atherosclerotic heart disease of native coronary artery without angina pectoris: Secondary | ICD-10-CM | POA: Diagnosis not present

## 2018-10-24 DIAGNOSIS — I700 Atherosclerosis of aorta: Secondary | ICD-10-CM | POA: Diagnosis not present

## 2018-10-24 LAB — GLUCOSE, CAPILLARY: Glucose-Capillary: 101 mg/dL — ABNORMAL HIGH (ref 70–99)

## 2018-10-24 MED ORDER — FLUDEOXYGLUCOSE F - 18 (FDG) INJECTION
8.6000 | Freq: Once | INTRAVENOUS | Status: AC
  Administered 2018-10-24: 8.6 via INTRAVENOUS

## 2018-10-25 ENCOUNTER — Encounter: Payer: Self-pay | Admitting: General Practice

## 2018-10-25 NOTE — Progress Notes (Signed)
HEMATOLOGY/ONCOLOGY CLINIC NOTE  Date of Service: 10/28/2018  Patient Care Team: [name], MD as PCP - General (Internal Medicine)  CHIEF COMPLAINTS/PURPOSE OF CONSULTATION:  Large B-Cell Lymphoma  Oncologic History:  Taylor Huynh presented with extranodal involvement in the terminal ileum s/p gross tumor resection in November 2010, and was diagnosed with High Grade B-Cell Non-Hodgkin's Lymphoma. She was treated with 4 cycles of R-CHOP between 07/05/09 and 09/06/09.  HISTORY OF PRESENTING ILLNESS:   Taylor Huynh is a wonderful 78 y.o. female who has been referred to [place] by Dr. Lawerance Cruel for evaluation and management of Large B-Cell Lymphoma. She is accompanied today by her daugher. The pt reports that she is doing well overall.  The pt reports that she found a lump in her right groin just before 07/17/18. She was evaluated by her PCP on 07/18/18 with a CT A/P, as noted below. She denies this lump changing much since she first noticed it, and endorses some soreness. The pt denies fevers, chills, night sweats or unexpected weight loss. Prior to this, the patient denies any changes in her quality of life or ability to function. She notes that her right leg became swollen yesterday as well. She denies changes in her bowel movements, abdominal pains, and left leg swelling. She denies any problems passing urine nor blood in the urine. She denies heart problems or recent chest pain or SOB. She denies changes in vision or headaches.   The pt has been eating well and endorses good appetite.   The pt takes Amlodipine every other day. She takes 17m Prilosec every day, as well as Vitamin B12 replacement. She denies stroke history, lung problems, nor heart problems. She notes a history of minor seizures, which she hasn't had since she began B12 replacement several years ago. The pt has never smoked cigarettes. She notes that she does not need help with any daily  activities and lives with her husband who also functions independently. She attends a local gym regularly.   Of note prior to the patient's visit today, pt has had a CT A/P completed on 07/18/18 with results revealing Extensive abdominal-pelvic adenopathy, consistent with recurrent lymphoma. 2. Tiny hiatal hernia. 3. Pelvic floor laxity. Aortic Atherosclerosis.  Most recent lab results (08/06/18) of CBC is as follows: all values are WNL except for RBC at 3.75, HGB at 10.6, HCT at 34.0  On review of systems, pt reports sore right inguinal lump, right leg swelling, good energy levels, sore right thigh, and denies changes in vision, headaches, abdominal pains, changes in bowel habits, problems passing urine, blood in the urine, left leg swelling, fevers, chills, night sweats, noticing other lumps or bumps, back pain, pain along the spine, calf pain, and any other symptoms.   On PMHx the pt reports High Grade B-Cell Non-Hodgkin's lymphoma in 2010, HTN, Vitamin B12 deficiency, minor seizures, GERD. On Social Hx the pt denies chemical exposure.  Interval History:   Taylor MADURAreturns today for management and evaluation of her newly diagnosed Recurrent/New High Grade B-Cell Non-Hodgkin's Lymphoma. I last saw the pt on 10/18/18 with C3 EPOCH-R discharge. The pt reports that she is doing well overall.   The pt reports that she has been eating well and has had stable weight. She denies having any mouth sores and notes that she has continued to not have leg swelling. The pt notes that she is no longer able to feel her right inguinal lymph node, which was initially  enlarged prior to treatment.  The pt has not been travelling outside her home and denies any concerns for infections at this time.   Of note since the patient's last visit, pt has had a PET/CT completed on 10/24/18 with results revealing Marked response to therapy of abdominopelvic lymphoma. Residual right pelvic hypermetabolic nodes.  (Deauville 5). 2. No new sites of disease. 3. Marrow hypermetabolism is likely due to stimulation by chemotherapy. 4. Coronary artery atherosclerosis. Aortic Atherosclerosis.  Lab results today (10/28/18) of CBC w/diff is as follows: all values are WNL except for WBC at 12.7k, RBC at 3.09, HGB at 8.6, HCT at 28.5, RDW at 17.2, ANC at 10k, Abs immature granulocytes at 0.61k. 10/28/18 CMP is pending  On review of systems, pt reports eating well, stable weight, resolution of right inguinal lymph node enlargement, and denies leg swelling, mouth sores, concerns for infections, abdominal pains, noticing any new lumps or bumps, and any other symptoms.   MEDICAL HISTORY:  Past Medical History:  Diagnosis Date   GERD (gastroesophageal reflux disease)    HH (hiatus hernia) 12/12/2011   Hypertension    Leukopenia 12/12/2011   nhl dx'd 2010   Seizures ([place]) reports "years ago"   Syncope 12/12/2011   Hx seizures - not documented - age 65; recent syncope 6/12 and x2, 12/12; negative MRI brain 1/13    SURGICAL HISTORY: Past Surgical History:  Procedure Laterality Date   COLON SURGERY     IR IMAGING GUIDED PORT INSERTION  08/23/2018    SOCIAL HISTORY: Social History   Socioeconomic History   Marital status: Married    Spouse name: Not on file   Number of children: Not on file   Years of education: Not on file   Highest education level: Not on file  Occupational History   Not on file  Social Needs   Financial resource strain: Not on file   Food insecurity:    Worry: Not on file    Inability: Not on file   Transportation needs:    Medical: Not on file    Non-medical: Not on file  Tobacco Use   Smoking status: Never Smoker   Smokeless tobacco: Never Used  Substance and Sexual Activity   Alcohol use: No   Drug use: No   Sexual activity: Not on file  Lifestyle   Physical activity:    Days per week: Not on file    Minutes per session: Not on file   Stress: Not on  file  Relationships   Social connections:    Talks on phone: Not on file    Gets together: Not on file    Attends religious service: Not on file    Active member of club or organization: Not on file    Attends meetings of clubs or organizations: Not on file    Relationship status: Not on file   Intimate partner violence:    Fear of current or ex partner: Not on file    Emotionally abused: Not on file    Physically abused: Not on file    Forced sexual activity: Not on file  Other Topics Concern   Not on file  Social History Narrative   Not on file    FAMILY HISTORY: No family history on file.  ALLERGIES:  has No Known Allergies.  MEDICATIONS:  Current Outpatient Medications  Medication Sig Dispense Refill   amLODipine (NORVASC) 10 MG tablet Take 10 mg by mouth every other day.  cholecalciferol (VITAMIN D) 1000 UNITS tablet Take 1,000 Units by mouth daily.       lidocaine-prilocaine (EMLA) cream Apply 1 application topically as needed. (Patient taking differently: Apply 1 application topically as needed (port access). ) 30 g 1   Melatonin 5 MG TABS Take 5 mg by mouth at bedtime.      omeprazole (PRILOSEC) 20 MG capsule Take 20 mg by mouth daily.     polyethylene glycol (MIRALAX / GLYCOLAX) packet Take 17 g by mouth daily as needed. 14 each 0   potassium chloride SA (K-DUR,KLOR-CON) 20 MEQ tablet Take 1 tablet (20 mEq total) by mouth 2 (two) times daily. 30 tablet 0   senna-docusate (SENOKOT-S) 8.6-50 MG tablet Take 2 tablets by mouth at bedtime as needed for mild constipation. (Patient not taking: Reported on 10/14/2018) 60 tablet 1   vitamin B-12 (CYANOCOBALAMIN) 1000 MCG tablet Take 1,000 mcg by mouth every other day.     No current facility-administered medications for this visit.     REVIEW OF SYSTEMS:    A 10+ POINT REVIEW OF SYSTEMS WAS OBTAINED including neurology, dermatology, psychiatry, cardiac, respiratory, lymph, extremities, GI, GU,  Musculoskeletal, constitutional, breasts, reproductive, HEENT.  All pertinent positives are noted in the HPI.  All others are negative.   PHYSICAL EXAMINATION: ECOG PERFORMANCE STATUS: 1 - Symptomatic but completely ambulatory  . Vitals:   10/28/18 0943  BP: (!) 142/75  Pulse: 93  Resp: 19  Temp: 98.6 F (37 C)  SpO2: 100%   Filed Weights   10/28/18 0943  Weight: 168 lb 14.4 oz (76.6 kg)   .Body mass index is 28.99 kg/m.  GENERAL:alert, in no acute distress and comfortable SKIN: no acute rashes, no significant lesions EYES: conjunctiva are pink and non-injected, sclera anicteric OROPHARYNX: MMM, no exudates, no oropharyngeal erythema or ulceration NECK: supple, no JVD LYMPH:  no palpable lymphadenopathy in the cervical, axillary or inguinal regions LUNGS: clear to auscultation b/l with normal respiratory effort HEART: regular rate & rhythm ABDOMEN:  normoactive bowel sounds , non tender, not distended. No palpable hepatosplenomegaly.  Extremity: no pedal edema PSYCH: alert & oriented x 3 with fluent speech NEURO: no focal motor/sensory deficits   LABORATORY DATA:  I have reviewed the data as listed  . CBC Latest Ref Rng & Units 10/28/2018 10/18/2018 10/17/2018  WBC 4.0 - 10.5 K/uL 12.7(H) 2.3(L) 3.4(L)  Hemoglobin 12.0 - 15.0 g/dL 8.6(L) 8.6(L) 7.9(L)  Hematocrit 36.0 - 46.0 % 28.5(L) 28.0(L) 26.6(L)  Platelets 150 - 400 K/uL 163 376 349    . CMP Latest Ref Rng & Units 10/28/2018 10/18/2018 10/17/2018  Glucose 70 - 99 mg/dL 105(H) 94 103(H)  BUN 8 - 23 mg/dL _0 Creatinine 0.44 - 1.00 mg/dL 0.93 0.71 0.63  Sodium 135 - 145 mmol/L 141 142 143  Potassium 3.5 - 5.1 mmol/L 3.7 3.2(L) 3.3(L)  Chloride 98 - 111 mmol/L 107 109 113(H)  CO2 22 - 32 mmol/L _1 Calcium 8.9 - 10.3 mg/dL 8.8(L) 8.6(L) 8.4(L)  Total Protein 6.5 - 8.1 g/dL 6.4(L) - -  Total Bilirubin 0.3 - 1.2 mg/dL 0.2(L) - -  Alkaline Phos 38 - 126 U/L 93 - -  AST 15 - 41 U/L 11(L) - -  ALT 0 - 44  U/L 11 - -   08/06/18 Biopsy:    06/01/09 Pathology:     07/18/18 CBC w/diff:    RADIOGRAPHIC STUDIES: I have personally reviewed the radiological images as listed and  agreed with the findings in the report.  08/14/18 US Venous RLE:   Nm Pet Image Restag (ps) Skull Base To Thigh  Result Date: 10/24/2018 CLINICAL DATA:  Subsequent treatment strategy for restaging of large B-cell lymphoma. Status post 3 cycles of chemotherapy. EXAM: NUCLEAR MEDICINE PET SKULL BASE TO THIGH TECHNIQUE: 8.6 mCi F-18 FDG was injected intravenously. Full-ring PET imaging was performed from the skull base to thigh after the radiotracer. CT data was obtained and used for attenuation correction and anatomic localization. Fasting blood glucose: 101 mg/dl COMPARISON:  PET 08/22/2018. FINDINGS: Mediastinal blood pool activity: SUV max 2.4 Liver activity: S.U.V. max 3.6 NECK: No areas of abnormal hypermetabolism. Incidental CT findings: No cervical adenopathy. Bilateral carotid atherosclerosis. CHEST: No pulmonary parenchymal or thoracic nodal hypermetabolism. Incidental CT findings: Aortic and proximal LAD coronary artery calcification. Right Port-A-Cath tip at superior caval/atrial junction. No thoracic adenopathy. Tiny hiatal hernia. ABDOMEN/PELVIS: Resolution of previously described hypermetabolic abdominal adenopathy. Right common iliac node measures 1.2 cm and a S.U.V. max of 12.1 on image 145/4. Adenopathy in this area on the prior measured 3.5 cm and a S.U.V. max of 39.3. Hypermetabolic residual right inguinal nodes. Example at 1.8 cm and a S.U.V. max of 9.7 on image 178/4. Compare 4.6 cm and a S.U.V. max of 43.7 on the prior. Incidental CT findings: Normal adrenal glands. Cholecystectomy. Mild hepatic steatosis. Low-density left renal lesion is likely a cyst. Abdominal aortic atherosclerosis. 9 mm aortocaval node is decreased in size from 2.5 cm on the prior (when remeasured). Hysterectomy. Pelvic floor laxity.  Enterotomy. Fatty replacement throughout the pancreas. SKELETON: Diffuse marrow hypermetabolism is likely due to stimulation by chemotherapy. The previously described focal areas of marrow hypermetabolism may have resolved or be obscured by the diffuse process. Incidental CT findings: none IMPRESSION: 1. 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 Residual right pelvic hypermetabolic nodes. (Deauville 5). 2. No new sites of disease. 3. Marrow hypermetabolism is likely due to stimulation by chemotherapy. 4. Coronary artery atherosclerosis. Aortic Atherosclerosis (ICD10-I70.0). Electronically Signed   By: Abigail Miyamoto M.D.   On: 10/24/2018 12:39    ASSESSMENT & PLAN:   78 y.o. female with  1. 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 Newly Diagnosed Recurrent/New High Grade B-Cell Non-Hodgkin's Lymphoma, Stage IV FISH negative for findings of double hit lymphoma  07/18/18 CT A/P revealed Extensive abdominal-pelvic adenopathy, consistent with recurrent lymphoma. 2. Tiny hiatal hernia. 3. Pelvic floor laxity. Aortic Atherosclerosis.  Labs upon initial presentation from1/14/20, WBC normal at 4.3k, HGB at 10.6, PLT normal at 201k  08/06/18 Right Inguinal LN Biopsy which revealed Large B-Cell Lymphoma with a Ki67 of 90%. FISH is negative for rearrangements of BCL2, BCL6, and MYC. 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  08/13/18 Hep B and Hep C negative  08/21/18 ECHO which revealed LV EF of 50-55%  08/22/18 PET/CT revealedProgression, since  51/70/0174, of hypermetabolic abdominopelvic adenopathy, consistent with active lymphoma. 2. CT occult hypermetabolic osseous foci, also most consistent with active lymphoma. 3. No evidence of soft tissue disease above the diaphragm. 4. Focus of hypermetabolism within the anus is likely physiologic. Consider physical exam correlation.  5. Aortic Atherosclerosis.  3. RLE swelling r/o DVT. Could be from lymphedema related to her new/recurrent NHL 1/21/10US Venous RLE to rule out DVT - reviewed - no VTE  4. Hypokalemia- resolved  5. Pancytopenia due to chemotherapy and DLBCL  6. DVT Px - lovenox [place]   PLAN: -Discussed pt labwork today, 10/28/18; HGB holding at 8.6, ANC at 10k in setting of G-CSF support -Discussed the 10/24/18 PET/CT which revealed Marked response to therapy of abdominopelvic lymphoma. Residual right pelvic hypermetabolic nodes. (Deauville 5). 2. No new sites of disease. 3. Marrow hypermetabolism is likely due to stimulation by chemotherapy. 4. Coronary artery atherosclerosis. Aortic Atherosclerosis. -Discussed the recommendation to complete at least an additional 2 cycles, as pt greatly prefers to limit the amount of cycles she completes without greatly reducing the efficacy of treatment. Have continued to discuss indications for treatment with pt and the risks vs benefits of continuing treatment vs under-treating. Have continued to discuss patient's goals of care with each cycle. She agrees to the plan to continue with treatment. -May be an indication to pursue consolidative RT -The pt has no prohibitive toxicities from beginning C4 dose reduced EPOCH-R next week, at this time. Dose reduced for age and previous chemotherapy exposure. -Will continue watching HGB, and will support with PRBC transfusion if necessary for hgb<8. Pt denies light headedness, fatigue, and dizziness at this time.  -Miralax and Senokot as needed as an outpatient. -Will gauge indications for larger doses  pending her display of tolerance -Recommended that the pt continue to eat well, drink at least 48-64 oz of water each day, and walk 20-30 minutes each day.  -Will plan C4 EPOCH-R admission on 11/04/18   Inpatient admission for [facility] for 5 days from 11/04/2018 Outpatient Rituxan and Neulasta on 11/11/2018 RTC with Dr Irene Limbo with labs on 11/18/2018   All of the patients questions were answered with apparent satisfaction. The patient knows to call the clinic with any problems, questions or concerns.  The total time spent in the appt was 25 minutes and more than 50% was on counseling and direct patient cares.    Sullivan Lone MD MS AAHIVMS [facility] Hematology/Oncology Physician [facility]  (Office):       [phone] (Work cell):  [phone] (Fax):           [phone]  10/28/2018 10:16 AM  I, Baldwin [place], am acting as a scribe for Dr. Sullivan Lone.   .I have reviewed the above documentation for accuracy and completeness, and I agree with the above. Brunetta Genera MD

## 2018-10-25 NOTE — Progress Notes (Signed)
[place] Team contacted patient to assess for food insecurity and other psychosocial needs during current COVID19 pandemic.  Lives w husband, family checks in on her frequently.  Patient/family expressed no needs at this time.  Support Team member encouraged patient to call if changes occur or they have any other questions/concerns.   Beverely Pace, [place]

## 2018-10-28 ENCOUNTER — Telehealth: Payer: Self-pay | Admitting: *Deleted

## 2018-10-28 ENCOUNTER — Telehealth: Payer: Self-pay | Admitting: Hematology

## 2018-10-28 ENCOUNTER — Other Ambulatory Visit: Payer: Self-pay

## 2018-10-28 ENCOUNTER — Inpatient Hospital Stay: Payer: Medicare Other | Attending: Hematology

## 2018-10-28 ENCOUNTER — Inpatient Hospital Stay (HOSPITAL_BASED_OUTPATIENT_CLINIC_OR_DEPARTMENT_OTHER): Payer: Medicare Other | Admitting: Hematology

## 2018-10-28 VITALS — BP 142/75 | HR 93 | Temp 98.6°F | Resp 19 | Ht 64.0 in | Wt 168.9 lb

## 2018-10-28 DIAGNOSIS — I10 Essential (primary) hypertension: Secondary | ICD-10-CM

## 2018-10-28 DIAGNOSIS — C8338 Diffuse large B-cell lymphoma, lymph nodes of multiple sites: Secondary | ICD-10-CM

## 2018-10-28 DIAGNOSIS — K449 Diaphragmatic hernia without obstruction or gangrene: Secondary | ICD-10-CM

## 2018-10-28 DIAGNOSIS — I2510 Atherosclerotic heart disease of native coronary artery without angina pectoris: Secondary | ICD-10-CM | POA: Diagnosis not present

## 2018-10-28 DIAGNOSIS — E538 Deficiency of other specified B group vitamins: Secondary | ICD-10-CM | POA: Diagnosis not present

## 2018-10-28 DIAGNOSIS — K219 Gastro-esophageal reflux disease without esophagitis: Secondary | ICD-10-CM

## 2018-10-28 DIAGNOSIS — D61818 Other pancytopenia: Secondary | ICD-10-CM | POA: Diagnosis not present

## 2018-10-28 DIAGNOSIS — Z8669 Personal history of other diseases of the nervous system and sense organs: Secondary | ICD-10-CM | POA: Diagnosis not present

## 2018-10-28 DIAGNOSIS — M7989 Other specified soft tissue disorders: Secondary | ICD-10-CM | POA: Diagnosis not present

## 2018-10-28 DIAGNOSIS — D72819 Decreased white blood cell count, unspecified: Secondary | ICD-10-CM

## 2018-10-28 DIAGNOSIS — I700 Atherosclerosis of aorta: Secondary | ICD-10-CM | POA: Insufficient documentation

## 2018-10-28 DIAGNOSIS — D649 Anemia, unspecified: Secondary | ICD-10-CM

## 2018-10-28 LAB — CMP (CANCER CENTER ONLY)
ALT: 11 U/L (ref 0–44)
AST: 11 U/L — ABNORMAL LOW (ref 15–41)
Albumin: 3.5 g/dL (ref 3.5–5.0)
Alkaline Phosphatase: 93 U/L (ref 38–126)
Anion gap: 9 (ref 5–15)
BUN: 11 mg/dL (ref 8–23)
CO2: 25 mmol/L (ref 22–32)
Calcium: 8.8 mg/dL — ABNORMAL LOW (ref 8.9–10.3)
Chloride: 107 mmol/L (ref 98–111)
Creatinine: 0.93 mg/dL (ref 0.44–1.00)
GFR, Est AFR Am: >60 mL/min (ref >60)
GFR, Estimated: 58 mL/min — ABNORMAL LOW (ref >60)
Glucose, Bld: 105 mg/dL — ABNORMAL HIGH (ref 70–99)
Potassium: 3.7 mmol/L (ref 3.5–5.1)
Sodium: 141 mmol/L (ref 135–145)
Total Bilirubin: 0.2 mg/dL — ABNORMAL LOW (ref 0.3–1.2)
Total Protein: 6.4 g/dL — ABNORMAL LOW (ref 6.5–8.1)

## 2018-10-28 LAB — CBC WITH DIFFERENTIAL/PLATELET
Abs Immature Granulocytes: 0.61 10*3/uL — ABNORMAL HIGH (ref 0.00–0.07)
Basophils Absolute: 0.1 10*3/uL (ref 0.0–0.1)
Basophils Relative: 1 %
Eosinophils Absolute: 0.2 10*3/uL (ref 0.0–0.5)
Eosinophils Relative: 1 %
HCT: 28.5 % — ABNORMAL LOW (ref 36.0–46.0)
Hemoglobin: 8.6 g/dL — ABNORMAL LOW (ref 12.0–15.0)
Immature Granulocytes: 5 %
Lymphocytes Relative: 7 %
Lymphs Abs: 0.8 10*3/uL (ref 0.7–4.0)
MCH: 27.8 pg (ref 26.0–34.0)
MCHC: 30.2 g/dL (ref 30.0–36.0)
MCV: 92.2 fL (ref 80.0–100.0)
Monocytes Absolute: 1 10*3/uL (ref 0.1–1.0)
Monocytes Relative: 8 %
Neutro Abs: 10 10*3/uL — ABNORMAL HIGH (ref 1.7–7.7)
Neutrophils Relative %: 78 %
Platelets: 163 10*3/uL (ref 150–400)
RBC: 3.09 MIL/uL — ABNORMAL LOW (ref 3.87–5.11)
RDW: 17.2 % — ABNORMAL HIGH (ref 11.5–15.5)
WBC: 12.7 10*3/uL — ABNORMAL HIGH (ref 4.0–10.5)
nRBC: 0.2 % (ref 0.0–0.2)

## 2018-10-28 LAB — TYPE AND SCREEN
ABO/RH(D): A POS
Antibody Screen: NEGATIVE

## 2018-10-28 LAB — ABO/RH: ABO/RH(D): A POS

## 2018-10-28 NOTE — Telephone Encounter (Signed)
Scheduled appt per 4/6 los 

## 2018-10-28 NOTE — Telephone Encounter (Signed)
Patient scheduled for inpatient admission AM on 4/13 per Dawn in bed placement.  Patient contacted and told to expect call on 4/13. Patient verbalized understanding.  Sent email to [email or web address] and an Inbasket to inform pharmacy.

## 2018-11-01 ENCOUNTER — Telehealth: Payer: Self-pay

## 2018-11-01 NOTE — Telephone Encounter (Signed)
Called patient and made her aware that she needs to wait to hear from the office on Monday in regards to her inpatient admission for [facility] scheduled for Monday. Patient verbalized understanding.

## 2018-11-04 ENCOUNTER — Other Ambulatory Visit: Payer: Self-pay

## 2018-11-04 ENCOUNTER — Encounter (HOSPITAL_COMMUNITY): Payer: Self-pay

## 2018-11-04 ENCOUNTER — Inpatient Hospital Stay (HOSPITAL_COMMUNITY)
Admission: RE | Admit: 2018-11-04 | Discharge: 2018-11-08 | DRG: 846 | Disposition: A | Discharge status: Home / Self Care | Payer: Medicare Other | Source: Ambulatory Visit | Attending: Hematology | Admitting: Hematology

## 2018-11-04 DIAGNOSIS — Z7189 Other specified counseling: Secondary | ICD-10-CM

## 2018-11-04 DIAGNOSIS — D649 Anemia, unspecified: Secondary | ICD-10-CM | POA: Diagnosis not present

## 2018-11-04 DIAGNOSIS — I10 Essential (primary) hypertension: Secondary | ICD-10-CM | POA: Diagnosis present

## 2018-11-04 DIAGNOSIS — D61810 Antineoplastic chemotherapy induced pancytopenia: Secondary | ICD-10-CM | POA: Diagnosis present

## 2018-11-04 DIAGNOSIS — C8330 Diffuse large B-cell lymphoma, unspecified site: Secondary | ICD-10-CM | POA: Diagnosis present

## 2018-11-04 DIAGNOSIS — T451X5A Adverse effect of antineoplastic and immunosuppressive drugs, initial encounter: Secondary | ICD-10-CM | POA: Diagnosis present

## 2018-11-04 DIAGNOSIS — C8338 Diffuse large B-cell lymphoma, lymph nodes of multiple sites: Secondary | ICD-10-CM

## 2018-11-04 DIAGNOSIS — R21 Rash and other nonspecific skin eruption: Secondary | ICD-10-CM

## 2018-11-04 DIAGNOSIS — K5900 Constipation, unspecified: Secondary | ICD-10-CM | POA: Diagnosis present

## 2018-11-04 DIAGNOSIS — K219 Gastro-esophageal reflux disease without esophagitis: Secondary | ICD-10-CM | POA: Diagnosis present

## 2018-11-04 DIAGNOSIS — Z5111 Encounter for antineoplastic chemotherapy: Secondary | ICD-10-CM | POA: Diagnosis present

## 2018-11-04 DIAGNOSIS — R600 Localized edema: Secondary | ICD-10-CM | POA: Diagnosis not present

## 2018-11-04 DIAGNOSIS — R569 Unspecified convulsions: Secondary | ICD-10-CM | POA: Diagnosis present

## 2018-11-04 DIAGNOSIS — E876 Hypokalemia: Secondary | ICD-10-CM | POA: Diagnosis present

## 2018-11-04 LAB — CBC WITH DIFFERENTIAL/PLATELET
Abs Immature Granulocytes: 0.03 K/uL (ref 0.00–0.07)
Basophils Absolute: 0.1 K/uL (ref 0.0–0.1)
Basophils Relative: 1 %
Eosinophils Absolute: 0.2 K/uL (ref 0.0–0.5)
Eosinophils Relative: 4 %
HCT: 26.7 % — ABNORMAL LOW (ref 36.0–46.0)
Hemoglobin: 8.1 g/dL — ABNORMAL LOW (ref 12.0–15.0)
Immature Granulocytes: 1 %
Lymphocytes Relative: 12 %
Lymphs Abs: 0.7 K/uL (ref 0.7–4.0)
MCH: 27.9 pg (ref 26.0–34.0)
MCHC: 30.3 g/dL (ref 30.0–36.0)
MCV: 92.1 fL (ref 80.0–100.0)
Monocytes Absolute: 0.6 K/uL (ref 0.1–1.0)
Monocytes Relative: 11 %
Neutro Abs: 3.9 K/uL (ref 1.7–7.7)
Neutrophils Relative %: 71 %
Platelets: 365 K/uL (ref 150–400)
RBC: 2.9 MIL/uL — ABNORMAL LOW (ref 3.87–5.11)
RDW: 16.9 % — ABNORMAL HIGH (ref 11.5–15.5)
WBC: 5.4 K/uL (ref 4.0–10.5)
nRBC: 0 % (ref 0.0–0.2)

## 2018-11-04 LAB — COMPREHENSIVE METABOLIC PANEL
ALT: 11 U/L (ref 0–44)
AST: 14 U/L — ABNORMAL LOW (ref 15–41)
Albumin: 3.5 g/dL (ref 3.5–5.0)
Alkaline Phosphatase: 59 U/L (ref 38–126)
Anion gap: 8 (ref 5–15)
BUN: 19 mg/dL (ref 8–23)
CO2: 22 mmol/L (ref 22–32)
Calcium: 8.3 mg/dL — ABNORMAL LOW (ref 8.9–10.3)
Chloride: 109 mmol/L (ref 98–111)
Creatinine, Ser: 0.89 mg/dL (ref 0.44–1.00)
GFR calc Af Amer: >60 mL/min (ref >60)
GFR calc non Af Amer: >60 mL/min (ref >60)
Glucose, Bld: 101 mg/dL — ABNORMAL HIGH (ref 70–99)
Potassium: 3.4 mmol/L — ABNORMAL LOW (ref 3.5–5.1)
Sodium: 139 mmol/L (ref 135–145)
Total Bilirubin: 0.2 mg/dL — ABNORMAL LOW (ref 0.3–1.2)
Total Protein: 6.2 g/dL — ABNORMAL LOW (ref 6.5–8.1)

## 2018-11-04 MED ORDER — PREDNISONE 50 MG PO TABS
60.0000 mg | ORAL_TABLET | Freq: Once | ORAL | Status: AC
  Administered 2018-11-04: 60 mg via ORAL
  Filled 2018-11-04: qty 1

## 2018-11-04 MED ORDER — PREDNISONE 50 MG PO TABS
60.0000 mg | ORAL_TABLET | Freq: Every day | ORAL | Status: DC
  Administered 2018-11-05 – 2018-11-08 (×4): 60 mg via ORAL
  Filled 2018-11-04 (×4): qty 1

## 2018-11-04 MED ORDER — LIDOCAINE-PRILOCAINE 2.5-2.5 % EX CREA
1.0000 "application " | TOPICAL_CREAM | CUTANEOUS | Status: DC | PRN
  Filled 2018-11-04: qty 5

## 2018-11-04 MED ORDER — SODIUM CHLORIDE 0.9 % IV SOLN
Freq: Once | INTRAVENOUS | Status: AC
  Administered 2018-11-04: 8 mg via INTRAVENOUS
  Filled 2018-11-04: qty 4

## 2018-11-04 MED ORDER — SENNOSIDES-DOCUSATE SODIUM 8.6-50 MG PO TABS
2.0000 | ORAL_TABLET | Freq: Every evening | ORAL | Status: DC | PRN
  Administered 2018-11-05: 2 via ORAL
  Filled 2018-11-04: qty 2

## 2018-11-04 MED ORDER — VINCRISTINE SULFATE CHEMO INJECTION 1 MG/ML
Freq: Once | INTRAVENOUS | Status: AC
  Administered 2018-11-04: 15:00:00 via INTRAVENOUS
  Filled 2018-11-04: qty 6

## 2018-11-04 MED ORDER — SODIUM CHLORIDE 0.9 % IV SOLN
INTRAVENOUS | Status: DC
  Administered 2018-11-04: 14:00:00 via INTRAVENOUS

## 2018-11-04 MED ORDER — ENOXAPARIN SODIUM 40 MG/0.4ML ~~LOC~~ SOLN
40.0000 mg | SUBCUTANEOUS | Status: DC
  Administered 2018-11-04 – 2018-11-06 (×3): 40 mg via SUBCUTANEOUS
  Filled 2018-11-04 (×3): qty 0.4

## 2018-11-04 MED ORDER — POLYETHYLENE GLYCOL 3350 17 G PO PACK
17.0000 g | PACK | Freq: Every day | ORAL | Status: DC | PRN
  Administered 2018-11-05: 17 g via ORAL
  Filled 2018-11-04: qty 1

## 2018-11-04 MED ORDER — AMLODIPINE BESYLATE 10 MG PO TABS
10.0000 mg | ORAL_TABLET | ORAL | Status: DC
  Administered 2018-11-06 – 2018-11-08 (×2): 10 mg via ORAL
  Filled 2018-11-04 (×3): qty 1

## 2018-11-04 MED ORDER — PANTOPRAZOLE SODIUM 40 MG PO TBEC
40.0000 mg | DELAYED_RELEASE_TABLET | Freq: Every day | ORAL | Status: DC
  Administered 2018-11-05 – 2018-11-08 (×4): 40 mg via ORAL
  Filled 2018-11-04 (×5): qty 1

## 2018-11-04 NOTE — Progress Notes (Signed)
Chemotherapy dosing for Doxorubicin, Etoposide, and Vincristine independently checked with Nancy Marus, RN based on patient's BSA and normal dosing of medications.

## 2018-11-04 NOTE — Progress Notes (Signed)
MD aware of Hgb & K+.  Ok to begin treatment. Kennith Center, Pharm.D., CPP 11/04/2018@12 :29 PM

## 2018-11-04 NOTE — H&P (Addendum)
HEMATOLOGY/ONCOLOGY CONSULTATION NOTE  Date of Service: 11/04/2018  [facility] Care Team: [name], MD as PCP - General (Internal Medicine)  CHIEF COMPLAINTS/PURPOSE OF CONSULTATION:  C4 dose reduced EPOCH-R for DLBCL  HISTORY OF PRESENTING ILLNESS:   Taylor Huynh is a wonderful 78 y.o. 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 Taylor pt reports that she is doing well overall.   Taylor pt reports that she has not developed any new concerns in Taylor interim, nor concerns for infections. She denies leg swelling, fevers, chills or night sweats.  Lab results today (11/04/18) of CBC w/diff and CMP reviewed  On review of systems, Taylor Huynh reports stable energy levels.  She denies fevers, chills, night sweats, lower extremity edema, concerns for infections, abdominal pain, nausea, vomiting, or any other symptoms.  MEDICAL HISTORY:  Past Medical History:  Diagnosis Date   GERD (gastroesophageal reflux disease)    HH (hiatus hernia) 12/12/2011   Hypertension    Leukopenia 12/12/2011   nhl dx'd 2010   Seizures ([place]) reports "years ago"   Syncope 12/12/2011   Hx seizures - not documented - age 43; recent syncope 6/12 and x2, 12/12; negative MRI brain 1/13    SURGICAL HISTORY: Past Surgical History:  Procedure Laterality Date   COLON SURGERY     IR IMAGING GUIDED PORT INSERTION  08/23/2018    SOCIAL HISTORY: Social History   Socioeconomic History   Marital status: Married    Spouse name: Not on file   Number of children: Not on file   Years of education: Not on file   Highest education level: Not on file  Occupational History   Not on file  Social Needs   Financial resource strain: Not on file   Food insecurity:    Worry: Not on file    Inability: Not on file   Transportation needs:    Medical: Not on file    Non-medical: Not on file  Tobacco Use   Smoking status: Never  Smoker   Smokeless tobacco: Never Used  Substance and Sexual Activity   Alcohol use: No   Drug use: No   Sexual activity: Not on file  Lifestyle   Physical activity:    Days per week: Not on file    Minutes per session: Not on file   Stress: Not on file  Relationships   Social connections:    Talks on phone: Not on file    Gets together: Not on file    Attends religious service: Not on file    Active member of club or organization: Not on file    Attends meetings of clubs or organizations: Not on file    Relationship status: Not on file   Intimate partner violence:    Fear of current or ex partner: Not on file    Emotionally abused: Not on file    Physically abused: Not on file    Forced sexual activity: Not on file  Other Topics Concern   Not on file  Social History Narrative   Not on file    FAMILY HISTORY: No family history on file.  ALLERGIES:  has No Known Allergies.  MEDICATIONS:  No current facility-administered medications for this encounter.     REVIEW OF SYSTEMS:    10 Point review of Systems was done is negative except as noted above.  PHYSICAL EXAMINATION: ECOG PERFORMANCE STATUS: 1 - Symptomatic but completely ambulatory  .  Vitals:   11/04/18 1050  BP: 125/83  Pulse: 95  Resp: 16  Temp: 98.5 F (36.9 C)  SpO2: 100%   Filed Weights   11/04/18 1050  Weight: 168 lb (76.2 kg)   .Body mass index is 28.84 kg/m.  GENERAL:alert, in no acute distress and comfortable SKIN: no acute rashes, no significant lesions EYES: conjunctiva are pink and non-injected, sclera anicteric OROPHARYNX: MMM, no exudates, no oropharyngeal erythema or ulceration NECK: supple, no JVD LYMPH:  no palpable lymphadenopathy in Taylor cervical, axillary or inguinal regions LUNGS: clear to auscultation b/l with normal respiratory effort HEART: regular rate & rhythm ABDOMEN:  normoactive bowel sounds , non tender, not distended. Extremity: no pedal edema PSYCH:  alert & oriented x 3 with fluent speech NEURO: no focal motor/sensory deficits  LABORATORY DATA:  I have reviewed Taylor data as listed  . CBC Latest Ref Rng & Units 11/04/2018 10/28/2018 10/18/2018  WBC 4.0 - 10.5 K/uL 5.4 12.7(H) 2.3(L)  Hemoglobin 12.0 - 15.0 g/dL 8.1(L) 8.6(L) 8.6(L)  Hematocrit 36.0 - 46.0 % 26.7(L) 28.5(L) 28.0(L)  Platelets 150 - 400 K/uL 365 163 376    . CMP Latest Ref Rng & Units 11/04/2018 10/28/2018 10/18/2018  Glucose 70 - 99 mg/dL 101(H) 105(H) 94  BUN 8 - 23 mg/dL '19 11 15  ' Creatinine 0.44 - 1.00 mg/dL 0.89 0.93 0.71  Sodium 135 - 145 mmol/L 139 141 142  Potassium 3.5 - 5.1 mmol/L 3.4(L) 3.7 3.2(L)  Chloride 98 - 111 mmol/L 109 107 109  CO2 22 - 32 mmol/L '22 25 26  ' Calcium 8.9 - 10.3 mg/dL 8.3(L) 8.8(L) 8.6(L)  Total Protein 6.5 - 8.1 g/dL 6.2(L) 6.4(L) -  Total Bilirubin 0.3 - 1.2 mg/dL 0.2(L) 0.2(L) -  Alkaline Phos 38 - 126 U/L 59 93 -  AST 15 - 41 U/L 14(L) 11(L) -  ALT 0 - 44 U/L 11 11 -     RADIOGRAPHIC STUDIES: I have personally reviewed Taylor radiological images as listed and agreed with Taylor findings in Taylor report. Nm Pet Image Restag (ps) Skull Base To Thigh  Result Date: 10/24/2018 CLINICAL DATA:  Subsequent treatment strategy for restaging of large B-cell lymphoma. Status post 3 cycles of chemotherapy. EXAM: NUCLEAR MEDICINE PET SKULL BASE TO THIGH TECHNIQUE: 8.6 mCi F-18 FDG was injected intravenously. Full-ring PET imaging was performed from Taylor skull base to thigh after Taylor radiotracer. CT data was obtained and used for attenuation correction and anatomic localization. Fasting blood glucose: 101 mg/dl COMPARISON:  PET 08/22/2018. FINDINGS: Mediastinal blood pool activity: SUV max 2.4 Liver activity: S.U.V. max 3.6 NECK: No areas of abnormal hypermetabolism. Incidental CT findings: No cervical adenopathy. Bilateral carotid atherosclerosis. CHEST: No pulmonary parenchymal or thoracic nodal hypermetabolism. Incidental CT findings: Aortic and proximal  LAD coronary artery calcification. Right Port-A-Cath tip at superior caval/atrial junction. No thoracic adenopathy. Tiny hiatal hernia. ABDOMEN/PELVIS: Resolution of previously described hypermetabolic abdominal adenopathy. Right common iliac node measures 1.2 cm and a S.U.V. max of 12.1 on image 145/4. Adenopathy in this area on Taylor prior measured 3.5 cm and a S.U.V. max of 39.3. Hypermetabolic residual right inguinal nodes. Example at 1.8 cm and a S.U.V. max of 9.7 on image 178/4. Compare 4.6 cm and a S.U.V. max of 43.7 on Taylor prior. Incidental CT findings: Normal adrenal glands. Cholecystectomy. Mild hepatic steatosis. Low-density left renal lesion is likely a cyst. Abdominal aortic atherosclerosis. 9 mm aortocaval node is decreased in size from 2.5 cm on Taylor prior (when remeasured). Hysterectomy.  Pelvic floor laxity. Enterotomy. Fatty replacement throughout Taylor pancreas. SKELETON: Diffuse marrow hypermetabolism is likely due to stimulation by chemotherapy. Taylor previously described focal areas of marrow hypermetabolism may have resolved or be obscured by Taylor diffuse process. Incidental CT findings: none IMPRESSION: 1. 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 Residual right pelvic hypermetabolic nodes. (Deauville 5). 2. No new sites of disease. 3. Marrow hypermetabolism is likely due to stimulation by chemotherapy. 4. Coronary artery atherosclerosis. Aortic Atherosclerosis (ICD10-I70.0). Electronically Signed   By: Abigail Miyamoto M.D.   On: 10/24/2018 12:39    ASSESSMENT & PLAN:  78 y.o. female with  1. 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 2. Tiny hiatal hernia. 3. Pelvic floor laxity. Aortic Atherosclerosis.  Labs upon initial presentation from1/14/20, WBC normal at 4.3k, HGB at 10.6, PLT normal at 201k  08/06/18 Right Inguinal LN Biopsy which revealed Large B-Cell Lymphoma with a Ki67 of 90%. FISH is negative for rearrangements of BCL2, BCL6, and MYC. Thus, Taylor findings are consistent with a diffuse large B-cell lymphoma.  08/13/18 Hep B and Hep C negative  08/21/18 ECHO which revealed LV EF of 50-55%  08/22/18 PET/CT revealedProgression, since 94/80/1655, of hypermetabolic abdominopelvic adenopathy, consistent with active lymphoma. 2. CT occult hypermetabolic osseous foci, also most consistent with active lymphoma. 3. No evidence of soft tissue disease above Taylor diaphragm. 4. Focus of hypermetabolism within Taylor anus is likely physiologic. Consider physical exam correlation. 5. Aortic Atherosclerosis.  3. RLE swelling r/o DVT. Could be from lymphedema related to her new/recurrent NHL 1/21/10US Venous RLE to rule out DVT - reviewed - no VTE Now resolved  4. Hypokalemia-resolved  5. Pancytopenia due to chemotherapy and DLBCL  6. 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 No evidence of bleeding  7. DVT Px - lovenox [place]  PLAN: -PET scan from 10/24/2018 revealed a market response to therapy of Taylor abdominopelvic lymphoma.  Residual right pelvic hypermetabolic nodes. (Deauville 5). 2. No new sites of disease. 3. Marrow hypermetabolism is likely due to stimulation by chemotherapy. 4. Coronary artery atherosclerosis. Aortic Atherosclerosis.  Recommend for Taylor Huynh to complete at least 2 additional cycles of chemotherapy.  Taylor Huynh is  agreeable. -Discussed pt labwork today, 11/04/18 -Taylor pt has no prohibitive toxicities from starting C4D1 dose reduced EPOCH-R at this time. Dose reduced for age and previous chemotherapy exposure. -chemotherapy orders placed, reviewed and signed -Continue eating well, staying hydrated, and staying as active as reasonably possible  -Continue with salt and baking soda mouthwashes 4-5 times each day -Will continue watching HGB, and will support with PRBC transfusion if necessary for hgb<8. Pt denies light headedness, fatigue, and dizziness at this time.  -Miralax -Encouraged Huynh to optimize po intake -Outpatient Rituxan and Neulasta on 11/11/18 -Will see pt back in clinic on 11/18/18   All of Taylor patients questions were answered with apparent satisfaction. Taylor Huynh knows to call Taylor clinic with any problems, questions or concerns.  Mikey Bussing, DNP, AGPCNP-BC, AOCNP  ADDENDUM  .Huynh was Personally and independently interviewed, examined and relevant elements of Taylor history of present illness were reviewed in details and an assessment and plan was created. All elements of Taylor Huynh's history of present illness , assessment and plan were discussed in details with Mikey Bussing, DNP, AGPCNP-BC, AOCNP. Taylor above documentation reflects our combined findings assessment and plan.  Sullivan Lone MD MS

## 2018-11-05 LAB — CBC
HCT: 26 % — ABNORMAL LOW (ref 36.0–46.0)
Hemoglobin: 7.9 g/dL — ABNORMAL LOW (ref 12.0–15.0)
MCH: 28.1 pg (ref 26.0–34.0)
MCHC: 30.4 g/dL (ref 30.0–36.0)
MCV: 92.5 fL (ref 80.0–100.0)
Platelets: 360 10*3/uL (ref 150–400)
RBC: 2.81 MIL/uL — ABNORMAL LOW (ref 3.87–5.11)
RDW: 16.6 % — ABNORMAL HIGH (ref 11.5–15.5)
WBC: 6.2 10*3/uL (ref 4.0–10.5)
nRBC: 0 % (ref 0.0–0.2)

## 2018-11-05 LAB — BASIC METABOLIC PANEL
Anion gap: 7 (ref 5–15)
BUN: 19 mg/dL (ref 8–23)
CO2: 20 mmol/L — ABNORMAL LOW (ref 22–32)
Calcium: 8.4 mg/dL — ABNORMAL LOW (ref 8.9–10.3)
Chloride: 114 mmol/L — ABNORMAL HIGH (ref 98–111)
Creatinine, Ser: 0.75 mg/dL (ref 0.44–1.00)
GFR calc Af Amer: >60 mL/min (ref >60)
GFR calc non Af Amer: >60 mL/min (ref >60)
Glucose, Bld: 123 mg/dL — ABNORMAL HIGH (ref 70–99)
Potassium: 3.7 mmol/L (ref 3.5–5.1)
Sodium: 141 mmol/L (ref 135–145)

## 2018-11-05 MED ORDER — SODIUM CHLORIDE 0.9 % IV SOLN
Freq: Once | INTRAVENOUS | Status: AC
  Administered 2018-11-05: 18 mg via INTRAVENOUS
  Filled 2018-11-05: qty 4

## 2018-11-05 MED ORDER — VINCRISTINE SULFATE CHEMO INJECTION 1 MG/ML
Freq: Once | INTRAVENOUS | Status: AC
  Administered 2018-11-05: 14:00:00 via INTRAVENOUS
  Filled 2018-11-05: qty 6

## 2018-11-05 NOTE — Progress Notes (Addendum)
HEMATOLOGY/ONCOLOGY INPATIENT PROGRESS NOTE  Date of Service: 11/05/2018  Inpatient Attending: .Brunetta Genera, MD   SUBJECTIVE:   Taylor Huynh is sitting in the recliner chair this morning. The pt reports that she is doing well overall.   The pt reports that she is enjoying some improved energy levels. She notes that she is eating well and has been ambulating periodically. Reports mild constipation. Taking MiraLax and Senokot.   Lab results today (11/05/18) of CBC w/diff and CMP is as follows: all values are WNL except for RBC at 2.81, HGB at 7.9, HCT at 26.0, RDW 16.6, chloride at 114, CO2 20, Glucose at 123, calcium 8.4.  On review of systems, pt reports improved energy levels, mild constipation, and denies leg swelling, abdominal pain, and any other symptoms.   OBJECTIVE:  NAD  PHYSICAL EXAMINATION: . Vitals:   11/04/18 1050 11/04/18 1248 11/05/18 0025 11/05/18 0528  BP: 125/83 (!) 147/68 112/62 123/61  Pulse: 95 86 73 76  Resp: _0 Temp: 98.5 F (36.9 C) 98.2 F (36.8 C) 98.2 F (36.8 C) 97.9 F (36.6 C)  TempSrc: Oral Oral Oral Oral  SpO2: 100% 100% 100% 100%  Weight: 168 lb (76.2 kg)     Height: _1  (1.626 m)      Filed Weights   11/04/18 1050  Weight: 168 lb (76.2 kg)   .Body mass index is 28.84 kg/m.  GENERAL:alert, in no acute distress and comfortable SKIN: no acute rashes, no significant lesions EYES: conjunctiva are pink and non-injected, sclera anicteric OROPHARYNX: MMM, no exudate, no oropharyngeal erythema or ulceration NECK: supple, no JVD LYMPH:  no palpable lymphadenopathy in the cervical, axillary or inguinal regions LUNGS: clear to auscultation b/l with normal respiratory effort HEART: regular rate & rhythm ABDOMEN:  normoactive bowel sounds , non tender, not distended. No palpable hepatosplenomegaly.  Extremity: no pedal edema PSYCH: alert & oriented x 3 with fluent speech NEURO: no focal motor/sensory deficits     MEDICAL HISTORY:  Past Medical History:  Diagnosis Date   GERD (gastroesophageal reflux disease)    HH (hiatus hernia) 12/12/2011   Hypertension    Leukopenia 12/12/2011   nhl dx'd 2010   Seizures ([place]) reports "years ago"   Syncope 12/12/2011   Hx seizures - not documented - age 43; recent syncope 6/12 and x2, 12/12; negative MRI brain 1/13    SURGICAL HISTORY: Past Surgical History:  Procedure Laterality Date   COLON SURGERY     IR IMAGING GUIDED PORT INSERTION  08/23/2018    SOCIAL HISTORY: Social History   Socioeconomic History   Marital status: Married    Spouse name: Not on file   Number of children: Not on file   Years of education: Not on file   Highest education level: Not on file  Occupational History   Not on file  Social Needs   Financial resource strain: Not on file   Food insecurity:    Worry: Not on file    Inability: Not on file   Transportation needs:    Medical: Not on file    Non-medical: Not on file  Tobacco Use   Smoking status: Never Smoker   Smokeless tobacco: Never Used  Substance and Sexual Activity   Alcohol use: No   Drug use: No   Sexual activity: Not on file  Lifestyle   Physical activity:    Days per week: Not on file    Minutes per session: Not on  file   Stress: Not on file  Relationships   Social connections:    Talks on phone: Not on file    Gets together: Not on file    Attends religious service: Not on file    Active member of club or organization: Not on file    Attends meetings of clubs or organizations: Not on file    Relationship status: Not on file   Intimate partner violence:    Fear of current or ex partner: Not on file    Emotionally abused: Not on file    Physically abused: Not on file    Forced sexual activity: Not on file  Other Topics Concern   Not on file  Social History Narrative   Not on file    FAMILY HISTORY: History reviewed. No pertinent family  history.  ALLERGIES:  has No Known Allergies.  MEDICATIONS:  Scheduled Meds:  amLODipine  10 mg Oral QODAY   DOXOrubicin/vinCRIStine/etoposide CHEMO IV infusion for Inpatient CI   Intravenous Once   enoxaparin (LOVENOX) injection  40 mg Subcutaneous Q24H   pantoprazole  40 mg Oral Daily   predniSONE  60 mg Oral QAC breakfast   Continuous Infusions:  sodium chloride 10 mL/hr at 11/04/18 1756   PRN Meds:.lidocaine-prilocaine, polyethylene glycol, senna-docusate  REVIEW OF SYSTEMS:    A 10+ POINT REVIEW OF SYSTEMS WAS OBTAINED including neurology, dermatology, psychiatry, cardiac, respiratory, lymph, extremities, GI, GU, Musculoskeletal, constitutional, breasts, reproductive, HEENT.  All pertinent positives are noted in the HPI.  All others are negative.   LABORATORY DATA:  I have reviewed the data as listed  . CBC Latest Ref Rng & Units 11/05/2018 11/04/2018 10/28/2018  WBC 4.0 - 10.5 K/uL 6.2 5.4 12.7(H)  Hemoglobin 12.0 - 15.0 g/dL 7.9(L) 8.1(L) 8.6(L)  Hematocrit 36.0 - 46.0 % 26.0(L) 26.7(L) 28.5(L)  Platelets 150 - 400 K/uL 360 365 163    . CMP Latest Ref Rng & Units 11/05/2018 11/04/2018 10/28/2018  Glucose 70 - 99 mg/dL 123(H) 101(H) 105(H)  BUN 8 - 23 mg/dL _0 Creatinine 0.44 - 1.00 mg/dL 0.75 0.89 0.93  Sodium 135 - 145 mmol/L 141 139 141  Potassium 3.5 - 5.1 mmol/L 3.7 3.4(L) 3.7  Chloride 98 - 111 mmol/L 114(H) 109 107  CO2 22 - 32 mmol/L 20(L) 22 25  Calcium 8.9 - 10.3 mg/dL 8.4(L) 8.3(L) 8.8(L)  Total Protein 6.5 - 8.1 g/dL - 6.2(L) 6.4(L)  Total Bilirubin 0.3 - 1.2 mg/dL - 0.2(L) 0.2(L)  Alkaline Phos 38 - 126 U/L - 59 93  AST 15 - 41 U/L - 14(L) 11(L)  ALT 0 - 44 U/L - 11 11   Component     Latest Ref Rng & Units 09/23/2018 09/24/2018  Magnesium     1.7 - 2.4 mg/dL 1.2 (L) 2.0  Phosphorus     2.5 - 4.6 mg/dL 2.6   Uric Acid, Serum     2.5 - 7.1 mg/dL 5.1   Vitamin D, 25-Hydroxy     30.0 - 100.0 ng/mL 32.6     RADIOGRAPHIC STUDIES:  Nm Pet  Image Restag (ps) Skull Base To Thigh  Result Date: 10/24/2018 CLINICAL DATA:  Subsequent treatment strategy for restaging of large B-cell lymphoma. Status post 3 cycles of chemotherapy. EXAM: NUCLEAR MEDICINE PET SKULL BASE TO THIGH TECHNIQUE: 8.6 mCi F-18 FDG was injected intravenously. Full-ring PET imaging was performed from the skull base to thigh after the radiotracer. CT data was obtained and used for attenuation  correction and anatomic localization. Fasting blood glucose: 101 mg/dl COMPARISON:  PET 08/22/2018. FINDINGS: Mediastinal blood pool activity: SUV max 2.4 Liver activity: S.U.V. max 3.6 NECK: No areas of abnormal hypermetabolism. Incidental CT findings: No cervical adenopathy. Bilateral carotid atherosclerosis. CHEST: No pulmonary parenchymal or thoracic nodal hypermetabolism. Incidental CT findings: Aortic and proximal LAD coronary artery calcification. Right Port-A-Cath tip at superior caval/atrial junction. No thoracic adenopathy. Tiny hiatal hernia. ABDOMEN/PELVIS: Resolution of previously described hypermetabolic abdominal adenopathy. Right common iliac node measures 1.2 cm and a S.U.V. max of 12.1 on image 145/4. Adenopathy in this area on the prior measured 3.5 cm and a S.U.V. max of 39.3. Hypermetabolic residual right inguinal nodes. Example at 1.8 cm and a S.U.V. max of 9.7 on image 178/4. Compare 4.6 cm and a S.U.V. max of 43.7 on the prior. Incidental CT findings: Normal adrenal glands. Cholecystectomy. Mild hepatic steatosis. Low-density left renal lesion is likely a cyst. Abdominal aortic atherosclerosis. 9 mm aortocaval node is decreased in size from 2.5 cm on the prior (when remeasured). Hysterectomy. Pelvic floor laxity. Enterotomy. Fatty replacement throughout the pancreas. SKELETON: Diffuse marrow hypermetabolism is likely due to stimulation by chemotherapy. The previously described focal areas of marrow hypermetabolism may have resolved or be obscured by the diffuse process.  Incidental CT findings: none IMPRESSION: 1. 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 Residual right pelvic hypermetabolic nodes. (Deauville 5). 2. No new sites of disease. 3. Marrow hypermetabolism is likely due to stimulation by chemotherapy. 4. Coronary artery atherosclerosis. Aortic Atherosclerosis (ICD10-I70.0). Electronically Signed   By: Abigail Miyamoto M.D.   On: 10/24/2018 12:39    ASSESSMENT & PLAN:   78 y.o. female with  1. 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 2. Tiny hiatal hernia. 3. Pelvic floor laxity. Aortic Atherosclerosis.  Labs upon initial presentation from1/14/20, WBC normal at 4.3k, HGB at 10.6, PLT normal at 201k  08/06/18 Right Inguinal LN Biopsy which revealed Large B-Cell Lymphoma with a Ki67 of 90%. FISH is negative for rearrangements of BCL2, BCL6, and MYC. 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  08/13/18 Hep B and Hep C negative  08/21/18 ECHO which revealed LV EF of 50-55%  08/22/18 PET/CT revealedProgression, since 32/44/0102, of hypermetabolic abdominopelvic adenopathy, consistent with active lymphoma. 2. CT occult hypermetabolic osseous foci, also most consistent with active lymphoma. 3. No evidence of soft tissue disease above the diaphragm. 4. Focus of hypermetabolism within  the anus is likely physiologic. Consider physical exam correlation. 5. Aortic Atherosclerosis.  3. RLE swelling r/o DVT. Could be from lymphedema related to her new/recurrent NHL 1/21/10US Venous RLE to rule out DVT - reviewed - no VTE  4. Hypokalemia resolved. K 3.4 on admission  5. Pancytopenia due to chemotherapy and DLBCL  6. 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  No evidence of bleeding hgb stable.  7. DVT Px - lovenox [place]   8.  Hypocalcemia improved from 5.7 to 8.1 to 8.4  9.  Hypomagnesemia resolved with replacement and up from 1.2 to 2  PLAN: -Discussed pt labwork today, 11/05/18; HGB stable at 7.9, other blood counts stable.  -Potassium normal today. No need to add K+ replacement. Monitor.  -The pt has no prohibitive toxicities from continuing C4D2 dose reduced EPOCH-R at this time. Dose reduced for age and previous chemotherapy exposure. -Continue eating well, staying hydrated, and staying as active as reasonably possible  -lasix held per patient request. -Check vitamin D levels and replace aggressively to maintain 25-hydroxy vitamin D levels more than 60. -Emphasized the importance of eating well and increasing movement throughout treatments.  -Discussed that the patient could use a humidifier, saline spray to use to the nasal areas.  -Will continue watching HGB and will support with PRBC transfusion if necessary for hgb<8.  No transfusion has been ordered today due to shortage and policy to transfuse only if hemoglobin is less than 7.0 actively bleeding. -Monitor electrolytes closely -Miralax and Senna S for vincristine associated constipation with previous treatment.   -Marked response to treatment noted on PET scan after 3 cycles. Plan is to continue treatment for at least 2 additional cycles. Patient agreeable.  -Outpatient neulasta and Rituxan on 11/11/2018 -Will see the pt in clinic on 11/19/18   The total time spent in the appt was 25 minutes and  more than 50% was on counseling and direct patient cares.    Mikey Bussing, DNP, AGPCNP-BC, AOCNP   11/05/2018 8:23 AM   ADDENDUM  Patient was Personally and independently interviewed, examined and relevant elements of the history of present illness were reviewed in details and an assessment and plan was created. All elements of the patient's history of present illness , assessment and plan were discussed in details with Mikey Bussing, DNP, AGPCNP-BC, AOCNP. The above documentation reflects our combined findings assessment and plan.  Sullivan Lone MD MS

## 2018-11-06 DIAGNOSIS — D61810 Antineoplastic chemotherapy induced pancytopenia: Secondary | ICD-10-CM

## 2018-11-06 DIAGNOSIS — R600 Localized edema: Secondary | ICD-10-CM

## 2018-11-06 DIAGNOSIS — E876 Hypokalemia: Secondary | ICD-10-CM

## 2018-11-06 LAB — BASIC METABOLIC PANEL
Anion gap: 7 (ref 5–15)
BUN: 19 mg/dL (ref 8–23)
CO2: 20 mmol/L — ABNORMAL LOW (ref 22–32)
Calcium: 8.5 mg/dL — ABNORMAL LOW (ref 8.9–10.3)
Chloride: 117 mmol/L — ABNORMAL HIGH (ref 98–111)
Creatinine, Ser: 0.81 mg/dL (ref 0.44–1.00)
GFR calc Af Amer: >60 mL/min (ref >60)
GFR calc non Af Amer: >60 mL/min (ref >60)
Glucose, Bld: 110 mg/dL — ABNORMAL HIGH (ref 70–99)
Potassium: 3.4 mmol/L — ABNORMAL LOW (ref 3.5–5.1)
Sodium: 144 mmol/L (ref 135–145)

## 2018-11-06 LAB — CBC
HCT: 25.6 % — ABNORMAL LOW (ref 36.0–46.0)
Hemoglobin: 7.6 g/dL — ABNORMAL LOW (ref 12.0–15.0)
MCH: 27.5 pg (ref 26.0–34.0)
MCHC: 29.7 g/dL — ABNORMAL LOW (ref 30.0–36.0)
MCV: 92.8 fL (ref 80.0–100.0)
Platelets: 367 10*3/uL (ref 150–400)
RBC: 2.76 MIL/uL — ABNORMAL LOW (ref 3.87–5.11)
RDW: 16.7 % — ABNORMAL HIGH (ref 11.5–15.5)
WBC: 7.2 10*3/uL (ref 4.0–10.5)
nRBC: 0 % (ref 0.0–0.2)

## 2018-11-06 MED ORDER — POTASSIUM CHLORIDE CRYS ER 20 MEQ PO TBCR: 20.0000 meq | EXTENDED_RELEASE_TABLET | Freq: Every day | ORAL | Status: DC

## 2018-11-06 MED ORDER — VINCRISTINE SULFATE CHEMO INJECTION 1 MG/ML
Freq: Once | INTRAVENOUS | Status: AC
  Administered 2018-11-06: 13:00:00 via INTRAVENOUS
  Filled 2018-11-06: qty 6

## 2018-11-06 MED ORDER — SODIUM CHLORIDE 0.9 % IV SOLN
Freq: Once | INTRAVENOUS | Status: AC
  Administered 2018-11-06: 18 mg via INTRAVENOUS
  Filled 2018-11-06: qty 4

## 2018-11-06 MED ORDER — POTASSIUM CHLORIDE CRYS ER 20 MEQ PO TBCR
40.0000 meq | EXTENDED_RELEASE_TABLET | Freq: Every day | ORAL | Status: DC
  Administered 2018-11-06 – 2018-11-08 (×3): 40 meq via ORAL
  Filled 2018-11-06 (×3): qty 2

## 2018-11-06 NOTE — Progress Notes (Addendum)
HEMATOLOGY/ONCOLOGY INPATIENT PROGRESS NOTE  Date of Service: 11/06/2018  Inpatient Attending: .Brunetta Genera, MD   SUBJECTIVE:   Taylor Huynh is sitting in the recliner chair this morning. The pt reports that she is doing well overall.   The pt reports that she is enjoying some improved energy levels. She notes that she is eating well and has been ambulating periodically. Bowels moving. Taking MiraLax and Senokot.   Lab results today (11/06/18) of CBC w/diff and CMP is as follows: all values are WNL except for RBC at 2.76, HGB at 7.6, HCT at 25.6, MCHC 29.7, RDW 16.7, Potassium 3.4, chloride at 117, CO2 20, Glucose at 110, calcium 8.5.  On review of systems, pt reports improved energy levels, and denies leg swelling, abdominal pain, and any other symptoms.   OBJECTIVE:  NAD  PHYSICAL EXAMINATION: . Vitals:   11/05/18 0528 11/05/18 1511 11/05/18 2104 11/06/18 0518  BP: 123/61 138/68 132/72 (!) 148/75  Pulse: 76 79 78 69  Resp: _0 Temp: 97.9 F (36.6 C) 97.8 F (36.6 C) 97.7 F (36.5 C) 97.8 F (36.6 C)  TempSrc: Oral Oral Oral Oral  SpO2: 100% 99% 96% 97%  Weight:      Height:       Filed Weights   11/04/18 1050  Weight: 168 lb (76.2 kg)   .Body mass index is 28.84 kg/m.  GENERAL:alert, in no acute distress and comfortable SKIN: no acute rashes, no significant lesions EYES: conjunctiva are pink and non-injected, sclera anicteric OROPHARYNX: MMM, no exudate, no oropharyngeal erythema or ulceration NECK: supple, no JVD LYMPH:  no palpable lymphadenopathy in the cervical, axillary or inguinal regions LUNGS: clear to auscultation b/l with normal respiratory effort HEART: regular rate & rhythm ABDOMEN:  normoactive bowel sounds , non tender, not distended. No palpable hepatosplenomegaly.  Extremity: no pedal edema PSYCH: alert & oriented x 3 with fluent speech NEURO: no focal motor/sensory deficits    MEDICAL HISTORY:  Past Medical  History:  Diagnosis Date   GERD (gastroesophageal reflux disease)    HH (hiatus hernia) 12/12/2011   Hypertension    Leukopenia 12/12/2011   nhl dx'd 2010   Seizures ([place]) reports "years ago"   Syncope 12/12/2011   Hx seizures - not documented - age 39; recent syncope 6/12 and x2, 12/12; negative MRI brain 1/13    SURGICAL HISTORY: Past Surgical History:  Procedure Laterality Date   COLON SURGERY     IR IMAGING GUIDED PORT INSERTION  08/23/2018    SOCIAL HISTORY: Social History   Socioeconomic History   Marital status: Married    Spouse name: Not on file   Number of children: Not on file   Years of education: Not on file   Highest education level: Not on file  Occupational History   Not on file  Social Needs   Financial resource strain: Not on file   Food insecurity:    Worry: Not on file    Inability: Not on file   Transportation needs:    Medical: Not on file    Non-medical: Not on file  Tobacco Use   Smoking status: Never Smoker   Smokeless tobacco: Never Used  Substance and Sexual Activity   Alcohol use: No   Drug use: No   Sexual activity: Not on file  Lifestyle   Physical activity:    Days per week: Not on file    Minutes per session: Not on file   Stress:  Not on file  Relationships   Social connections:    Talks on phone: Not on file    Gets together: Not on file    Attends religious service: Not on file    Active member of club or organization: Not on file    Attends meetings of clubs or organizations: Not on file    Relationship status: Not on file   Intimate partner violence:    Fear of current or ex partner: Not on file    Emotionally abused: Not on file    Physically abused: Not on file    Forced sexual activity: Not on file  Other Topics Concern   Not on file  Social History Narrative   Not on file    FAMILY HISTORY: History reviewed. No pertinent family history.  ALLERGIES:  has No Known  Allergies.  MEDICATIONS:  Scheduled Meds:  amLODipine  10 mg Oral QODAY   DOXOrubicin/vinCRIStine/etoposide CHEMO IV infusion for Inpatient CI   Intravenous Once   enoxaparin (LOVENOX) injection  40 mg Subcutaneous Q24H   pantoprazole  40 mg Oral Daily   predniSONE  60 mg Oral QAC breakfast   Continuous Infusions:  sodium chloride 10 mL/hr at 11/04/18 1756   PRN Meds:.lidocaine-prilocaine, polyethylene glycol, senna-docusate  REVIEW OF SYSTEMS:    A 10+ POINT REVIEW OF SYSTEMS WAS OBTAINED including neurology, dermatology, psychiatry, cardiac, respiratory, lymph, extremities, GI, GU, Musculoskeletal, constitutional, breasts, reproductive, HEENT.  All pertinent positives are noted in the HPI.  All others are negative.   LABORATORY DATA:  I have reviewed the data as listed  . CBC Latest Ref Rng & Units 11/06/2018 11/05/2018 11/04/2018  WBC 4.0 - 10.5 K/uL 7.2 6.2 5.4  Hemoglobin 12.0 - 15.0 g/dL 7.6(L) 7.9(L) 8.1(L)  Hematocrit 36.0 - 46.0 % 25.6(L) 26.0(L) 26.7(L)  Platelets 150 - 400 K/uL 367 360 365    . CMP Latest Ref Rng & Units 11/06/2018 11/05/2018 11/04/2018  Glucose 70 - 99 mg/dL 110(H) 123(H) 101(H)  BUN 8 - 23 mg/dL _0 Creatinine 0.44 - 1.00 mg/dL 0.81 0.75 0.89  Sodium 135 - 145 mmol/L 144 141 139  Potassium 3.5 - 5.1 mmol/L 3.4(L) 3.7 3.4(L)  Chloride 98 - 111 mmol/L 117(H) 114(H) 109  CO2 22 - 32 mmol/L 20(L) 20(L) 22  Calcium 8.9 - 10.3 mg/dL 8.5(L) 8.4(L) 8.3(L)  Total Protein 6.5 - 8.1 g/dL - - 6.2(L)  Total Bilirubin 0.3 - 1.2 mg/dL - - 0.2(L)  Alkaline Phos 38 - 126 U/L - - 59  AST 15 - 41 U/L - - 14(L)  ALT 0 - 44 U/L - - 11   Component     Latest Ref Rng & Units 09/23/2018 09/24/2018  Magnesium     1.7 - 2.4 mg/dL 1.2 (L) 2.0  Phosphorus     2.5 - 4.6 mg/dL 2.6   Uric Acid, Serum     2.5 - 7.1 mg/dL 5.1   Vitamin D, 25-Hydroxy     30.0 - 100.0 ng/mL 32.6     RADIOGRAPHIC STUDIES:  Nm Pet Image Restag (ps) Skull Base To Thigh  Result  Date: 10/24/2018 CLINICAL DATA:  Subsequent treatment strategy for restaging of large B-cell lymphoma. Status post 3 cycles of chemotherapy. EXAM: NUCLEAR MEDICINE PET SKULL BASE TO THIGH TECHNIQUE: 8.6 mCi F-18 FDG was injected intravenously. Full-ring PET imaging was performed from the skull base to thigh after the radiotracer. CT data was obtained and used for attenuation correction and anatomic localization.  Fasting blood glucose: 101 mg/dl COMPARISON:  PET 08/22/2018. FINDINGS: Mediastinal blood pool activity: SUV max 2.4 Liver activity: S.U.V. max 3.6 NECK: No areas of abnormal hypermetabolism. Incidental CT findings: No cervical adenopathy. Bilateral carotid atherosclerosis. CHEST: No pulmonary parenchymal or thoracic nodal hypermetabolism. Incidental CT findings: Aortic and proximal LAD coronary artery calcification. Right Port-A-Cath tip at superior caval/atrial junction. No thoracic adenopathy. Tiny hiatal hernia. ABDOMEN/PELVIS: Resolution of previously described hypermetabolic abdominal adenopathy. Right common iliac node measures 1.2 cm and a S.U.V. max of 12.1 on image 145/4. Adenopathy in this area on the prior measured 3.5 cm and a S.U.V. max of 39.3. Hypermetabolic residual right inguinal nodes. Example at 1.8 cm and a S.U.V. max of 9.7 on image 178/4. Compare 4.6 cm and a S.U.V. max of 43.7 on the prior. Incidental CT findings: Normal adrenal glands. Cholecystectomy. Mild hepatic steatosis. Low-density left renal lesion is likely a cyst. Abdominal aortic atherosclerosis. 9 mm aortocaval node is decreased in size from 2.5 cm on the prior (when remeasured). Hysterectomy. Pelvic floor laxity. Enterotomy. Fatty replacement throughout the pancreas. SKELETON: Diffuse marrow hypermetabolism is likely due to stimulation by chemotherapy. The previously described focal areas of marrow hypermetabolism may have resolved or be obscured by the diffuse process. Incidental CT findings: none IMPRESSION: 1. 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 Residual right pelvic hypermetabolic nodes. ([place] 5). 2. No new sites of disease. 3. Marrow hypermetabolism is likely due to stimulation by chemotherapy. 4. Coronary artery atherosclerosis. Aortic Atherosclerosis (ICD10-I70.0). Electronically Signed   By: Abigail Miyamoto M.D.   On: 10/24/2018 12:39    ASSESSMENT & PLAN:   78 y.o. female with  1. 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 2. Tiny hiatal hernia. 3. Pelvic floor laxity. Aortic Atherosclerosis.  Labs upon initial presentation from1/14/20, WBC normal at 4.3k, HGB at 10.6, PLT normal at 201k  08/06/18 Right Inguinal LN Biopsy which revealed Large B-Cell Lymphoma with a Ki67 of 90%. FISH is negative for rearrangements of BCL2, BCL6, and MYC. 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  08/13/18 Hep B and Hep C negative  08/21/18 ECHO which revealed LV EF of 50-55%  08/22/18 PET/CT revealedProgression, since 76/28/3151, of hypermetabolic abdominopelvic adenopathy, consistent with active lymphoma. 2. CT occult hypermetabolic osseous foci, also most consistent with active lymphoma. 3. No evidence of soft tissue disease above the diaphragm. 4. Focus of hypermetabolism within the anus is likely physiologic. Consider physical  exam correlation. 5. Aortic Atherosclerosis.  3. RLE swelling r/o DVT. Could be from lymphedema related to her new/recurrent NHL 1/21/10US Venous RLE to rule out DVT - reviewed - no VTE  4. Hypokalemia. K 3.4 on admission  5. Pancytopenia due to chemotherapy and DLBCL  6. 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  No evidence of bleeding hgb stable.  7. DVT Px - lovenox [place]   8.  Hypocalcemia improved from 5.7 to 8.1 to 8.4  9.  Hypomagnesemia resolved  with replacement and up from 1.2 to 2  PLAN: -Discussed pt labwork today, 11/06/18; HGB stable at 7.6, other blood counts stable.  -Potassium 3.4 today. Begin KDur 20 Meq daily.   -The pt has no prohibitive toxicities from continuing C4D3 dose reduced EPOCH-R at this time. Dose reduced for age and previous chemotherapy exposure. -Continue eating well, staying hydrated, and staying as active as reasonably possible  -lasix held per patient request. -Check vitamin D levels and replace aggressively to maintain 25-hydroxy vitamin D levels more than 60. -Emphasized the importance of eating well and increasing movement throughout treatments.  -Discussed that the patient could use a humidifier, saline spray to use to the nasal areas.  -Will continue watching HGB and will support with PRBC transfusion if necessary for hgb<8.  No transfusion has been ordered today due to shortage and policy to transfuse only if hemoglobin is less than 7.0 actively bleeding. Will add a type and hold to tomorrow am lab work. The patient may need PRBC transfusion prior to discharge. -Monitor electrolytes closely -Miralax and Senna S for vincristine associated constipation with previous treatment.   -Marked response to treatment noted on PET scan after 3 cycles. Plan is to continue treatment for at least 2 additional cycles. Patient agreeable.  -Outpatient neulasta and Rituxan on 11/11/2018 -Will see the pt in clinic on 11/19/18   The total time spent  in the appt was 25 minutes and more than 50% was on counseling and direct patient cares.    Mikey Bussing, DNP, AGPCNP-BC, AOCNP   11/06/2018 8:06 AM    ADDENDUM  .Patient was Personally and independently interviewed, examined and relevant elements of the history of present illness were reviewed in details and an assessment and plan was created. All elements of the patient's history of present illness , assessment and plan were discussed in details with Mikey Bussing, DNP, AGPCNP-BC, AOCNP. The above documentation reflects our combined findings assessment and plan.  Sullivan Lone MD MS

## 2018-11-07 LAB — CBC
HCT: 27.1 % — ABNORMAL LOW (ref 36.0–46.0)
Hemoglobin: 8.4 g/dL — ABNORMAL LOW (ref 12.0–15.0)
MCH: 28.6 pg (ref 26.0–34.0)
MCHC: 31 g/dL (ref 30.0–36.0)
MCV: 92.2 fL (ref 80.0–100.0)
Platelets: 368 10*3/uL (ref 150–400)
RBC: 2.94 MIL/uL — ABNORMAL LOW (ref 3.87–5.11)
RDW: 16.4 % — ABNORMAL HIGH (ref 11.5–15.5)
WBC: 4.9 10*3/uL (ref 4.0–10.5)
nRBC: 0 % (ref 0.0–0.2)

## 2018-11-07 LAB — BASIC METABOLIC PANEL
Anion gap: 7 (ref 5–15)
BUN: 20 mg/dL (ref 8–23)
CO2: 21 mmol/L — ABNORMAL LOW (ref 22–32)
Calcium: 8.6 mg/dL — ABNORMAL LOW (ref 8.9–10.3)
Chloride: 115 mmol/L — ABNORMAL HIGH (ref 98–111)
Creatinine, Ser: 0.81 mg/dL (ref 0.44–1.00)
GFR calc Af Amer: >60 mL/min (ref >60)
GFR calc non Af Amer: >60 mL/min (ref >60)
Glucose, Bld: 108 mg/dL — ABNORMAL HIGH (ref 70–99)
Potassium: 3.8 mmol/L (ref 3.5–5.1)
Sodium: 143 mmol/L (ref 135–145)

## 2018-11-07 LAB — SAMPLE TO BLOOD BANK

## 2018-11-07 MED ORDER — TRIAMCINOLONE ACETONIDE 0.1 % EX CREA
TOPICAL_CREAM | Freq: Three times a day (TID) | CUTANEOUS | Status: DC
  Administered 2018-11-08: 09:00:00 via TOPICAL
  Filled 2018-11-07: qty 15

## 2018-11-07 MED ORDER — SODIUM CHLORIDE 0.9 % IV SOLN
Freq: Once | INTRAVENOUS | Status: AC
  Administered 2018-11-08: 16 mg via INTRAVENOUS
  Filled 2018-11-07: qty 8

## 2018-11-07 MED ORDER — DIPHENHYDRAMINE HCL 25 MG PO CAPS
25.0000 mg | ORAL_CAPSULE | Freq: Once | ORAL | Status: AC
  Administered 2018-11-07: 25 mg via ORAL
  Filled 2018-11-07: qty 1

## 2018-11-07 MED ORDER — SODIUM CHLORIDE 0.9 % IV SOLN
400.0000 mg/m2 | Freq: Once | INTRAVENOUS | Status: AC
  Administered 2018-11-08: 780 mg via INTRAVENOUS
  Filled 2018-11-07: qty 39

## 2018-11-07 MED ORDER — VINCRISTINE SULFATE CHEMO INJECTION 1 MG/ML
Freq: Once | INTRAVENOUS | Status: AC
  Administered 2018-11-07: 11:00:00 via INTRAVENOUS
  Filled 2018-11-07: qty 6

## 2018-11-07 MED ORDER — DIPHENHYDRAMINE HCL 25 MG PO CAPS: 25.0000 mg | ORAL_CAPSULE | Freq: Four times a day (QID) | ORAL | Status: DC | PRN

## 2018-11-07 MED ORDER — SODIUM CHLORIDE 0.9 % IV SOLN
Freq: Once | INTRAVENOUS | Status: AC
  Administered 2018-11-07: 8 mg via INTRAVENOUS
  Filled 2018-11-07: qty 4

## 2018-11-07 NOTE — Care Management Important Message (Signed)
Important Message  Patient Details IM Letter given to Case Manager to present to the Patient Name: Taylor Huynh MRN: [id number] Date of Birth: 05-06-1941   Medicare Important Message Given:  Yes    [name] 11/07/2018, 2:33 [place] Message  Patient Details  Name: Taylor Huynh MRN: [id number] Date of Birth: 1941/07/03   Medicare Important Message Given:  Yes    [name] 11/07/2018, 2:33 PM

## 2018-11-07 NOTE — Progress Notes (Signed)
Chemotherapy dosage and calculations checked and reviewed with Kim Osborne, RN. 

## 2018-11-07 NOTE — Progress Notes (Addendum)
HEMATOLOGY/ONCOLOGY INPATIENT PROGRESS NOTE  Date of Service: 11/07/2018  Inpatient Attending: .Brunetta Genera, MD   SUBJECTIVE:   Phylliss Blakes Groene is sitting in the recliner chair this morning. The pt reports that she is doing well overall.   The pt reports that she is enjoying some improved energy levels. She notes that she is eating well and has been ambulating periodically. Bowels moving. Taking MiraLax and Senokot.   Lab results today (11/07/18) of CBC w/diff and CMP is as follows: all values are WNL except for RBC at 2.94, HGB at 8.4, HCT at 27.1, RDW 16.4, chloride at 115, CO2 21, Glucose at 108, calcium 8.6.  On review of systems, pt reports improved energy levels, and denies leg swelling, abdominal pain, and any other symptoms.   OBJECTIVE:  NAD  PHYSICAL EXAMINATION: . Vitals:   11/06/18 0518 11/06/18 1403 11/06/18 2112 11/07/18 0559  BP: (!) 148/75 (!) 138/100 137/74 (!) 154/68  Pulse: 69 81 65 65  Resp: '19 18 16 16  ' Temp: 97.8 F (36.6 C) 97.8 F (36.6 C) 98.4 F (36.9 C) 98.3 F (36.8 C)  TempSrc: Oral Oral Oral Oral  SpO2: 97% 100% 100% 99%  Weight:      Height:       Filed Weights   11/04/18 1050  Weight: 168 lb (76.2 kg)   .Body mass index is 28.84 kg/m.  GENERAL:alert, in no acute distress and comfortable SKIN: no acute rashes, no significant lesions EYES: conjunctiva are pink and non-injected, sclera anicteric OROPHARYNX: MMM, no exudate, no oropharyngeal erythema or ulceration NECK: supple, no JVD LYMPH:  no palpable lymphadenopathy in the cervical, axillary or inguinal regions LUNGS: clear to auscultation b/l with normal respiratory effort HEART: regular rate & rhythm ABDOMEN:  normoactive bowel sounds , non tender, not distended. No palpable hepatosplenomegaly.  Extremity: no pedal edema PSYCH: alert & oriented x 3 with fluent speech NEURO: no focal motor/sensory deficits    MEDICAL HISTORY:  Past Medical History:  Diagnosis  Date   GERD (gastroesophageal reflux disease)    HH (hiatus hernia) 12/12/2011   Hypertension    Leukopenia 12/12/2011   nhl dx'd 2010   Seizures ([place]) reports "years ago"   Syncope 12/12/2011   Hx seizures - not documented - age 65; recent syncope 6/12 and x2, 12/12; negative MRI brain 1/13    SURGICAL HISTORY: Past Surgical History:  Procedure Laterality Date   COLON SURGERY     IR IMAGING GUIDED PORT INSERTION  08/23/2018    SOCIAL HISTORY: Social History   Socioeconomic History   Marital status: Married    Spouse name: Not on file   Number of children: Not on file   Years of education: Not on file   Highest education level: Not on file  Occupational History   Not on file  Social Needs   Financial resource strain: Not on file   Food insecurity:    Worry: Not on file    Inability: Not on file   Transportation needs:    Medical: Not on file    Non-medical: Not on file  Tobacco Use   Smoking status: Never Smoker   Smokeless tobacco: Never Used  Substance and Sexual Activity   Alcohol use: No   Drug use: No   Sexual activity: Not on file  Lifestyle   Physical activity:    Days per week: Not on file    Minutes per session: Not on file   Stress: Not on file  Relationships   Social connections:    Talks on phone: Not on file    Gets together: Not on file    Attends religious service: Not on file    Active member of club or organization: Not on file    Attends meetings of clubs or organizations: Not on file    Relationship status: Not on file   Intimate partner violence:    Fear of current or ex partner: Not on file    Emotionally abused: Not on file    Physically abused: Not on file    Forced sexual activity: Not on file  Other Topics Concern   Not on file  Social History Narrative   Not on file    FAMILY HISTORY: History reviewed. No pertinent family history.  ALLERGIES:  has No Known Allergies.  MEDICATIONS:  Scheduled  Meds:  amLODipine  10 mg Oral QODAY   DOXOrubicin/vinCRIStine/etoposide CHEMO IV infusion for Inpatient CI   Intravenous Once   DOXOrubicin/vinCRIStine/etoposide CHEMO IV infusion for Inpatient CI   Intravenous Once   enoxaparin (LOVENOX) injection  40 mg Subcutaneous Q24H   pantoprazole  40 mg Oral Daily   potassium chloride  40 mEq Oral Daily   predniSONE  60 mg Oral QAC breakfast   Continuous Infusions:  sodium chloride 10 mL/hr at 11/04/18 1756   ondansetron (ZOFRAN) with dexamethasone (DECADRON) IV     PRN Meds:.lidocaine-prilocaine, polyethylene glycol, senna-docusate  REVIEW OF SYSTEMS:    A 10+ POINT REVIEW OF SYSTEMS WAS OBTAINED including neurology, dermatology, psychiatry, cardiac, respiratory, lymph, extremities, GI, GU, Musculoskeletal, constitutional, breasts, reproductive, HEENT.  All pertinent positives are noted in the HPI.  All others are negative.   LABORATORY DATA:  I have reviewed the data as listed  . CBC Latest Ref Rng & Units 11/07/2018 11/06/2018 11/05/2018  WBC 4.0 - 10.5 K/uL 4.9 7.2 6.2  Hemoglobin 12.0 - 15.0 g/dL 8.4(L) 7.6(L) 7.9(L)  Hematocrit 36.0 - 46.0 % 27.1(L) 25.6(L) 26.0(L)  Platelets 150 - 400 K/uL 368 367 360    . CMP Latest Ref Rng & Units 11/07/2018 11/06/2018 11/05/2018  Glucose 70 - 99 mg/dL 108(H) 110(H) 123(H)  BUN 8 - 23 mg/dL '20 19 19  ' Creatinine 0.44 - 1.00 mg/dL 0.81 0.81 0.75  Sodium 135 - 145 mmol/L 143 144 141  Potassium 3.5 - 5.1 mmol/L 3.8 3.4(L) 3.7  Chloride 98 - 111 mmol/L 115(H) 117(H) 114(H)  CO2 22 - 32 mmol/L 21(L) 20(L) 20(L)  Calcium 8.9 - 10.3 mg/dL 8.6(L) 8.5(L) 8.4(L)  Total Protein 6.5 - 8.1 g/dL - - -  Total Bilirubin 0.3 - 1.2 mg/dL - - -  Alkaline Phos 38 - 126 U/L - - -  AST 15 - 41 U/L - - -  ALT 0 - 44 U/L - - -   Component     Latest Ref Rng & Units 09/23/2018 09/24/2018  Magnesium     1.7 - 2.4 mg/dL 1.2 (L) 2.0  Phosphorus     2.5 - 4.6 mg/dL 2.6   Uric Acid, Serum     2.5 - 7.1 mg/dL 5.1    Vitamin D, 25-Hydroxy     30.0 - 100.0 ng/mL 32.6     RADIOGRAPHIC STUDIES:  Nm Pet Image Restag (ps) Skull Base To Thigh  Result Date: 10/24/2018 CLINICAL DATA:  Subsequent treatment strategy for restaging of large B-cell lymphoma. Status post 3 cycles of chemotherapy. EXAM: NUCLEAR MEDICINE PET SKULL BASE TO THIGH TECHNIQUE: 8.6 mCi F-18 FDG was  injected intravenously. Full-ring PET imaging was performed from the skull base to thigh after the radiotracer. CT data was obtained and used for attenuation correction and anatomic localization. Fasting blood glucose: 101 mg/dl COMPARISON:  PET 08/22/2018. FINDINGS: Mediastinal blood pool activity: SUV max 2.4 Liver activity: S.U.V. max 3.6 NECK: No areas of abnormal hypermetabolism. Incidental CT findings: No cervical adenopathy. Bilateral carotid atherosclerosis. CHEST: No pulmonary parenchymal or thoracic nodal hypermetabolism. Incidental CT findings: Aortic and proximal LAD coronary artery calcification. Right Port-A-Cath tip at superior caval/atrial junction. No thoracic adenopathy. Tiny hiatal hernia. ABDOMEN/PELVIS: Resolution of previously described hypermetabolic abdominal adenopathy. Right common iliac node measures 1.2 cm and a S.U.V. max of 12.1 on image 145/4. Adenopathy in this area on the prior measured 3.5 cm and a S.U.V. max of 39.3. Hypermetabolic residual right inguinal nodes. Example at 1.8 cm and a S.U.V. max of 9.7 on image 178/4. Compare 4.6 cm and a S.U.V. max of 43.7 on the prior. Incidental CT findings: Normal adrenal glands. Cholecystectomy. Mild hepatic steatosis. Low-density left renal lesion is likely a cyst. Abdominal aortic atherosclerosis. 9 mm aortocaval node is decreased in size from 2.5 cm on the prior (when remeasured). Hysterectomy. Pelvic floor laxity. Enterotomy. Fatty replacement throughout the pancreas. SKELETON: Diffuse marrow hypermetabolism is likely due to stimulation by chemotherapy. The previously described focal  areas of marrow hypermetabolism may have resolved or be obscured by the diffuse process. Incidental CT findings: none IMPRESSION: 1. 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 Residual right pelvic hypermetabolic nodes. ([place] 5). 2. No new sites of disease. 3. Marrow hypermetabolism is likely due to stimulation by chemotherapy. 4. Coronary artery atherosclerosis. Aortic Atherosclerosis (ICD10-I70.0). Electronically Signed   By: Abigail Miyamoto M.D.   On: 10/24/2018 12:39    ASSESSMENT & PLAN:   78 y.o. female with  1. 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 2. Tiny hiatal hernia. 3. Pelvic floor laxity. Aortic Atherosclerosis.  Labs upon initial presentation from1/14/20, WBC normal at 4.3k, HGB at 10.6, PLT normal at 201k  08/06/18 Right Inguinal LN Biopsy which revealed Large B-Cell Lymphoma with a Ki67 of 90%. FISH is negative for rearrangements of BCL2, BCL6, and MYC. 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  08/13/18 Hep B and Hep C negative  08/21/18 ECHO which revealed LV EF of 50-55%  08/22/18 PET/CT revealedProgression, since 62/70/3500, of hypermetabolic abdominopelvic adenopathy, consistent with active lymphoma. 2. CT occult hypermetabolic osseous foci, also most consistent with active lymphoma. 3. No  evidence of soft tissue disease above the diaphragm. 4. Focus of hypermetabolism within the anus is likely physiologic. Consider physical exam correlation. 5. Aortic Atherosclerosis.  3. RLE swelling r/o DVT. Could be from lymphedema related to her new/recurrent NHL 1/21/10US Venous RLE to rule out DVT - reviewed - no VTE  4. Hypokalemia. K 3.4 on admission  5. Pancytopenia due to chemotherapy and DLBCL  6. 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  No evidence  of bleeding hgb stable.  7. DVT Px - lovenox [place]   8.  Hypocalcemia improved from 5.7 to 8.1 to 8.4  9.  Hypomagnesemia resolved with replacement and up from 1.2 to 2  PLAN: -Discussed pt labwork today, 11/07/18; HGB improved to 8.4, other blood counts stable.  -Potassium 3.8 today. Continue KDur 20 Meq daily.   -The pt has no prohibitive toxicities from continuing C4D4 dose reduced EPOCH-R at this time. Dose reduced for age and previous chemotherapy exposure. -Continue eating well, staying hydrated, and staying as active as reasonably possible  -lasix held per patient request. -Check vitamin D levels and replace aggressively to maintain 25-hydroxy vitamin D levels more than 60. -Emphasized the importance of eating well and increasing movement throughout treatments.  -Discussed that the patient could use a humidifier, saline spray to use to the nasal areas.  -Will continue watching HGB and will support with PRBC transfusion if necessary for hgb<8.  -Monitor electrolytes closely -Miralax and Senna S for vincristine associated constipation with previous treatment.   -Marked response to treatment noted on PET scan after 3 cycles. Plan is to continue treatment for at least 2 additional cycles. Patient agreeable.  -Outpatient neulasta and Rituxan on 11/11/2018 -Will see the pt in clinic on 11/19/18   The total time spent in the appt was 25 minutes and more than 50% was on counseling and direct patient cares.     Mikey Bussing, DNP, AGPCNP-BC, AOCNP   11/07/2018 9:15 AM    ADDENDUM  .Patient was Personally and independently interviewed, examined and relevant elements of the history of present illness were reviewed in details and an assessment and plan was created. All elements of the patient's history of present illness , assessment and plan were discussed in details with Mikey Bussing, DNP, AGPCNP-BC, AOCNP. The above documentation reflects our combined findings assessment and plan.  Sullivan Lone MD MS

## 2018-11-08 LAB — CBC
HCT: 25 % — ABNORMAL LOW (ref 36.0–46.0)
Hemoglobin: 7.8 g/dL — ABNORMAL LOW (ref 12.0–15.0)
MCH: 27.9 pg (ref 26.0–34.0)
MCHC: 31.2 g/dL (ref 30.0–36.0)
MCV: 89.3 fL (ref 80.0–100.0)
Platelets: 360 10*3/uL (ref 150–400)
RBC: 2.8 MIL/uL — ABNORMAL LOW (ref 3.87–5.11)
RDW: 15.7 % — ABNORMAL HIGH (ref 11.5–15.5)
WBC: 2.8 10*3/uL — ABNORMAL LOW (ref 4.0–10.5)
nRBC: 0 % (ref 0.0–0.2)

## 2018-11-08 LAB — BASIC METABOLIC PANEL
Anion gap: 6 (ref 5–15)
BUN: 15 mg/dL (ref 8–23)
CO2: 24 mmol/L (ref 22–32)
Calcium: 8.5 mg/dL — ABNORMAL LOW (ref 8.9–10.3)
Chloride: 113 mmol/L — ABNORMAL HIGH (ref 98–111)
Creatinine, Ser: 0.68 mg/dL (ref 0.44–1.00)
GFR calc Af Amer: >60 mL/min (ref >60)
GFR calc non Af Amer: >60 mL/min (ref >60)
Glucose, Bld: 105 mg/dL — ABNORMAL HIGH (ref 70–99)
Potassium: 3.3 mmol/L — ABNORMAL LOW (ref 3.5–5.1)
Sodium: 143 mmol/L (ref 135–145)

## 2018-11-08 MED ORDER — HEPARIN SOD (PORK) LOCK FLUSH 100 UNIT/ML IV SOLN
500.0000 [IU] | INTRAVENOUS | Status: AC | PRN
  Administered 2018-11-08: 500 [IU]

## 2018-11-08 NOTE — Discharge Summary (Addendum)
Physician Discharge Summary  Patient ID: Taylor Huynh MRN: [id number] DOB/AGE: 80-Jan-1942 80 y.o.  Admit date: 11/04/2018 Discharge date: 11/08/2018  Primary Care Physician:  [name], MD  Discharge Diagnoses: Diffuse large B-cell lymphoma admitted for cycle 4 of EPOCH-R  Present on Admission: . Diffuse large B cell lymphoma ([place])  Allergies as of 11/08/2018   No Known Allergies     Medication List    TAKE these medications   amLODipine 10 MG tablet Commonly known as:  NORVASC Take 10 mg by mouth every other day.   cholecalciferol 1000 units tablet Commonly known as:  VITAMIN D Take 1,000 Units by mouth daily.   lidocaine-prilocaine cream Commonly known as:  EMLA Apply 1 application topically as needed. What changed:  reasons to take this   Melatonin 5 MG Tabs Take 5 mg by mouth at bedtime.   omeprazole 20 MG capsule Commonly known as:  PRILOSEC Take 20 mg by mouth daily.   polyethylene glycol 17 g packet Commonly known as:  MIRALAX / GLYCOLAX Take 17 g by mouth daily as needed.   potassium chloride SA 20 MEQ tablet Commonly known as:  K-DUR Take 1 tablet (20 mEq total) by mouth 2 (two) times daily.   senna-docusate 8.6-50 MG tablet Commonly known as:  Senokot-S Take 2 tablets by mouth at bedtime as needed for mild constipation.   vitamin B-12 1000 MCG tablet Commonly known as:  CYANOCOBALAMIN Take 1,000 mcg by mouth every other day.        Disposition:  -Outpatient Neulasta and Rituxan on 11/11/2018 -Follow-up with Dr. Irene Limbo on 11/19/2018 for toxicity and nadir check.  Discharge laboratory values: CBC    Component Value Date/Time   WBC 2.8 (L) 11/08/2018 0531   RBC 2.80 (L) 11/08/2018 0531   HGB 7.8 (L) 11/08/2018 0531   HGB 9.4 (L) 09/09/2018 0803   HGB 12.1 09/22/2013 1222   HCT 25.0 (L) 11/08/2018 0531   HCT 37.3 09/22/2013 1222   PLT 360 11/08/2018 0531   PLT 101 (L) 09/09/2018 0803   PLT 248 09/22/2013 1222   MCV 89.3  11/08/2018 0531   MCV 88.0 09/22/2013 1222   MCH 27.9 11/08/2018 0531   MCHC 31.2 11/08/2018 0531   RDW 15.7 (H) 11/08/2018 0531   RDW 13.6 09/22/2013 1222   LYMPHSABS 0.7 11/04/2018 1107   LYMPHSABS 1.4 09/22/2013 1222   MONOABS 0.6 11/04/2018 1107   MONOABS 0.3 09/22/2013 1222   EOSABS 0.2 11/04/2018 1107   EOSABS 0.1 09/22/2013 1222   BASOSABS 0.1 11/04/2018 1107   BASOSABS 0.0 09/22/2013 1222   CMP Latest Ref Rng & Units 11/08/2018 11/07/2018 11/06/2018  Glucose 70 - 99 mg/dL 105(H) 108(H) 110(H)  BUN 8 - 23 mg/dL _0 Creatinine 0.44 - 1.00 mg/dL 0.68 0.81 0.81  Sodium 135 - 145 mmol/L 143 143 144  Potassium 3.5 - 5.1 mmol/L 3.3(L) 3.8 3.4(L)  Chloride 98 - 111 mmol/L 113(H) 115(H) 117(H)  CO2 22 - 32 mmol/L 24 21(L) 20(L)  Calcium 8.9 - 10.3 mg/dL 8.5(L) 8.6(L) 8.5(L)  Total Protein 6.5 - 8.1 g/dL - - -  Total Bilirubin 0.3 - 1.2 mg/dL - - -  Alkaline Phos 38 - 126 U/L - - -  AST 15 - 41 U/L - - -  ALT 0 - 44 U/L - - -   Brief H&P: Taylor Huynh is a wonderful 78 y.o. female who has been admitted today for C4 dose reduced EPOCH-R treatment of  her Diffuse Large B-Cell Lymphoma. The pt reports that she is doing well overall.   The pt reports that she has not developed any new concerns in the interim, nor concerns for infections. She denies leg swelling, fevers, chills or night sweats.  Lab results today (11/04/18) are WNL except for RBC 2.90, HGB 8.1, HCT 26.7, RDW 16.9, K+ 3.4, Glucose 101, Calcium 8.3, Total Protein 6.2, AST 14, T. Bili 0.2  On review of systems, the patient reports stable energy levels.  She denies fevers, chills, night sweats, lower extremity edema, concerns for infections, abdominal pain, nausea, vomiting, or any other symptoms.  Issues during hospitalization 1. 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 Newly Diagnosed Recurrent/New High Grade B-Cell Non-Hodgkin's Lymphoma, Stage IV FISH negative for findings of double hit lymphoma  07/18/18 CT A/P revealed Extensive abdominal-pelvic adenopathy, consistent with recurrent lymphoma. 2. Tiny hiatal hernia. 3. Pelvic floor laxity. Aortic Atherosclerosis.  Labs upon initial presentation from1/14/20, WBC normal at 4.3k, HGB at 10.6, PLT normal at 201k  08/06/18 Right Inguinal LN Biopsy which revealed Large B-Cell Lymphoma with a Ki67 of 90%. FISH is negative for rearrangements of BCL2, BCL6, and MYC. 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  08/13/18 Hep B and Hep C negative  08/21/18 ECHO which revealed LV EF of 50-55%  08/22/18 PET/CT revealedProgression, since 05/17/8526, of hypermetabolic abdominopelvic adenopathy, consistent with active lymphoma. 2. CT occult hypermetabolic osseous foci, also most consistent with active lymphoma. 3. No evidence of soft tissue disease above the diaphragm. 4. Focus of hypermetabolism within the anus is likely physiologic. Consider physical exam correlation. 5. Aortic Atherosclerosis.  3. RLE swelling r/o DVT. Could be from lymphedema related to her new/recurrent NHL 1/21/10US Venous RLE to rule out DVT - reviewed - no VTE  4. Hypokalemia. K+ 3.4 on admission   5. Pancytopenia due to chemotherapy and DLBCL  6. 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 No evidence of bleeding  7. DVT Px - lovenox [place]  PLAN: -Discussed pt labwork today,11/08/2018, hemoglobin is stable at 7.8, total white blood cell count slightly low at 2.8, potassium is low at 3.3. -The pt has no prohibitive toxicities from  continuingC4D5 dose reduced EPOCH-Rat this time. Dose reduced for age and previous chemotherapy exposure -Continue eating well, staying hydrated, and staying as active as reasonably possible -Will continue watching HGB, and will support with PRBC transfusion if necessary for hgb<8. Pt denies light headedness, fatigue, and dizziness at this time.  -Miralax and Senokot as needed as an outpatient. -Encouraged patient to optimize po intake -Outpatient Rituxan and Neulasta on 11/11/18 -Will see pt back in clinic on 11/19/18  BP (!) 170/81 (BP Location: Right Arm)   Pulse 64   Temp 98.4 F (36.9 C) (Oral)   Resp 16   Ht _0  (1.626 m)   Wt 168 lb (76.2 kg)   LMP  (LMP Unknown)   SpO2 99%   BMI 28.84 kg/m   GENERAL:alert, in no acute distress and comfortable SKIN: no acute rashes, no significant lesions EYES: conjunctiva are pink and non-injected, sclera anicteric OROPHARYNX: MMM, no exudates, no oropharyngeal erythema or  ulceration NECK: supple, no JVD LYMPH:  no palpable lymphadenopathy in the cervical, axillary or inguinal regions LUNGS: clear to auscultation b/l with normal respiratory effort HEART: regular rate & rhythm ABDOMEN:  normoactive bowel sounds , non tender, not distended. Extremity: no pedal edema PSYCH: alert & oriented x 3 with fluent speech NEURO: no focal motor/sensory deficits  Hospital Course: Active Problems:  Diffuse Large B cell lymphoma ([place]) Encounter for antineoplastic chemotherapy  Diet: Regular diet  Activity: Infection precautions as directed  Condition at discharge: Stable  Signed: Mikey Bussing 11/08/2018, 9:03 AM   ADDENDUM  .Patient was Personally and independently interviewed, examined and relevant elements of the discharge plan and discharge orders were reviewed in details and an assessment and plan was created. All elements of the patient's history of present illness , assessment and plan were discussed in details with Mikey Bussing  NP. The above documentation reflects our combined findings assessment and plan.  Sullivan Lone MD MS  TT spent discharging patient>52mns

## 2018-11-11 ENCOUNTER — Other Ambulatory Visit: Payer: Self-pay

## 2018-11-11 ENCOUNTER — Inpatient Hospital Stay: Payer: Medicare Other

## 2018-11-11 VITALS — BP 127/97 | HR 76 | Temp 98.9°F | Resp 16

## 2018-11-11 DIAGNOSIS — K449 Diaphragmatic hernia without obstruction or gangrene: Secondary | ICD-10-CM | POA: Diagnosis not present

## 2018-11-11 DIAGNOSIS — I2510 Atherosclerotic heart disease of native coronary artery without angina pectoris: Secondary | ICD-10-CM | POA: Diagnosis not present

## 2018-11-11 DIAGNOSIS — E538 Deficiency of other specified B group vitamins: Secondary | ICD-10-CM | POA: Diagnosis not present

## 2018-11-11 DIAGNOSIS — D72819 Decreased white blood cell count, unspecified: Secondary | ICD-10-CM | POA: Diagnosis not present

## 2018-11-11 DIAGNOSIS — M7989 Other specified soft tissue disorders: Secondary | ICD-10-CM | POA: Diagnosis not present

## 2018-11-11 DIAGNOSIS — C8338 Diffuse large B-cell lymphoma, lymph nodes of multiple sites: Secondary | ICD-10-CM

## 2018-11-11 DIAGNOSIS — K219 Gastro-esophageal reflux disease without esophagitis: Secondary | ICD-10-CM | POA: Diagnosis not present

## 2018-11-11 DIAGNOSIS — D61818 Other pancytopenia: Secondary | ICD-10-CM | POA: Diagnosis not present

## 2018-11-11 DIAGNOSIS — I700 Atherosclerosis of aorta: Secondary | ICD-10-CM | POA: Diagnosis not present

## 2018-11-11 DIAGNOSIS — Z7189 Other specified counseling: Secondary | ICD-10-CM

## 2018-11-11 DIAGNOSIS — Z8669 Personal history of other diseases of the nervous system and sense organs: Secondary | ICD-10-CM | POA: Diagnosis not present

## 2018-11-11 DIAGNOSIS — I10 Essential (primary) hypertension: Secondary | ICD-10-CM | POA: Diagnosis not present

## 2018-11-11 MED ORDER — ACETAMINOPHEN 325 MG PO TABS
650.0000 mg | ORAL_TABLET | Freq: Once | ORAL | Status: AC
  Administered 2018-11-11: 08:00:00 650 mg via ORAL

## 2018-11-11 MED ORDER — SODIUM CHLORIDE 0.9% FLUSH
10.0000 mL | INTRAVENOUS | Status: DC | PRN
  Administered 2018-11-11: 11:00:00 10 mL
  Filled 2018-11-11: qty 10

## 2018-11-11 MED ORDER — HEPARIN SOD (PORK) LOCK FLUSH 100 UNIT/ML IV SOLN
500.0000 [IU] | Freq: Once | INTRAVENOUS | Status: AC | PRN
  Administered 2018-11-11: 11:00:00 500 [IU]
  Filled 2018-11-11: qty 5

## 2018-11-11 MED ORDER — SODIUM CHLORIDE 0.9 % IV SOLN
Freq: Once | INTRAVENOUS | Status: AC
  Administered 2018-11-11: 08:00:00 via INTRAVENOUS
  Filled 2018-11-11: qty 250

## 2018-11-11 MED ORDER — PEGFILGRASTIM INJECTION 6 MG/0.6ML ~~LOC~~
PREFILLED_SYRINGE | SUBCUTANEOUS | Status: AC
  Filled 2018-11-11: qty 0.6

## 2018-11-11 MED ORDER — PEGFILGRASTIM INJECTION 6 MG/0.6ML ~~LOC~~
6.0000 mg | PREFILLED_SYRINGE | Freq: Once | SUBCUTANEOUS | Status: AC
  Administered 2018-11-11: 11:00:00 6 mg via SUBCUTANEOUS

## 2018-11-11 MED ORDER — DIPHENHYDRAMINE HCL 25 MG PO CAPS
ORAL_CAPSULE | ORAL | Status: AC
  Filled 2018-11-11: qty 2

## 2018-11-11 MED ORDER — ACETAMINOPHEN 325 MG PO TABS
ORAL_TABLET | ORAL | Status: AC
  Filled 2018-11-11: qty 2

## 2018-11-11 MED ORDER — SODIUM CHLORIDE 0.9 % IV SOLN
375.0000 mg/m2 | Freq: Once | INTRAVENOUS | Status: AC
  Administered 2018-11-11: 09:00:00 700 mg via INTRAVENOUS
  Filled 2018-11-11: qty 50

## 2018-11-11 MED ORDER — DIPHENHYDRAMINE HCL 25 MG PO CAPS
50.0000 mg | ORAL_CAPSULE | Freq: Once | ORAL | Status: AC
  Administered 2018-11-11: 50 mg via ORAL

## 2018-11-11 NOTE — Progress Notes (Signed)
Per Dr. Irene Limbo. Patient is OK to treat with labs from 4/13 and 4/17.

## 2018-11-11 NOTE — Patient Instructions (Signed)
[place] Discharge Instructions for Patients Receiving Chemotherapy  Today you received the following chemotherapy agents: rituxin  To help prevent nausea and vomiting after your treatment, we encourage you to take your nausea medicationAs directedIf you develop nausea and vomiting that is not controlled by your nausea medication, call the clinic.   BELOW ARE SYMPTOMS THAT SHOULD BE REPORTED IMMEDIATELY:  *FEVER GREATER THAN 100.5 F  *CHILLS WITH OR WITHOUT FEVER  NAUSEA AND VOMITING THAT IS NOT CONTROLLED WITH YOUR NAUSEA MEDICATION  *UNUSUAL SHORTNESS OF BREATH  *UNUSUAL BRUISING OR BLEEDING  TENDERNESS IN MOUTH AND THROAT WITH OR WITHOUT PRESENCE OF ULCERS  *URINARY PROBLEMS  *BOWEL PROBLEMS  UNUSUAL RASH Items with * indicate a potential emergency and should be followed up as soon as possible.  Feel free to call the clinic should you have any questions or concerns. The clinic phone number is [phone].  Please show the [place] at check-in to the Emergency Department and triage nurse.   Rituximab injection What is this medicine? RITUXIMAB (ri TUX i mab) is a monoclonal antibody. It is used to treat certain types of cancer like non-Hodgkin lymphoma and chronic lymphocytic leukemia. It is also used to treat rheumatoid arthritis, granulomatosis with polyangiitis (or Wegener's granulomatosis), microscopic polyangiitis, and pemphigus vulgaris. This medicine may be used for other purposes; ask your health care provider or pharmacist if you have questions. COMMON BRAND NAME(S): Rituxan What should I tell my health care provider before I take this medicine? They need to know if you have any of these conditions: -heart disease -infection (especially a virus infection such as hepatitis B, chickenpox, cold sores, or herpes) -immune system problems -irregular heartbeat -kidney disease -low blood counts, like low white cell, platelet, or red cell  counts -lung or breathing disease, like asthma -recently received or scheduled to receive a vaccine -an unusual or allergic reaction to rituximab, other medicines, foods, dyes, or preservatives -pregnant or trying to get pregnant -breast-feeding How should I use this medicine? This medicine is for infusion into a vein. It is administered in a hospital or clinic by a specially trained health care professional. A special MedGuide will be given to you by the pharmacist with each prescription and refill. Be sure to read this information carefully each time. Talk to your pediatrician regarding the use of this medicine in children. This medicine is not approved for use in children. Overdosage: If you think you have taken too much of this medicine contact a poison control center or emergency room at once. NOTE: This medicine is only for you. Do not share this medicine with others. What if I miss a dose? It is important not to miss a dose. Call your doctor or health care professional if you are unable to keep an appointment. What may interact with this medicine? -cisplatin -live virus vaccines This list may not describe all possible interactions. Give your health care provider a list of all the medicines, herbs, non-prescription drugs, or dietary supplements you use. Also tell them if you smoke, drink alcohol, or use illegal drugs. Some items may interact with your medicine. What should I watch for while using this medicine? Your condition will be monitored carefully while you are receiving this medicine. You may need blood work done while you are taking this medicine. This medicine can cause serious allergic reactions. To reduce your risk you may need to take medicine before treatment with this medicine. Take your medicine as directed. In some patients,  this medicine may cause a serious brain infection that may cause death. If you have any problems seeing, thinking, speaking, walking, or standing, tell  your healthcare professional right away. If you cannot reach your healthcare professional, urgently seek other source of medical care. Call your doctor or health care professional for advice if you get a fever, chills or sore throat, or other symptoms of a cold or flu. Do not treat yourself. This drug decreases your body's ability to fight infections. Try to avoid being around people who are sick. Do not become pregnant while taking this medicine or for 12 months after stopping it. Women should inform their doctor if they wish to become pregnant or think they might be pregnant. There is a potential for serious side effects to an unborn child. Talk to your health care professional or pharmacist for more information. Do not breast-feed an infant while taking this medicine or for 6 months after stopping it. What side effects may I notice from receiving this medicine? Side effects that you should report to your doctor or health care professional as soon as possible: -allergic reactions like skin rash, itching or hives; swelling of the face, lips, or tongue -breathing problems -chest pain -changes in vision -diarrhea -headache with fever, neck stiffness, sensitivity to light, nausea, or confusion -fast, irregular heartbeat -loss of memory -low blood counts - this medicine may decrease the number of white blood cells, red blood cells and platelets. You may be at increased risk for infections and bleeding. -mouth sores -problems with balance, talking, or walking -redness, blistering, peeling or loosening of the skin, including inside the mouth -signs of infection - fever or chills, cough, sore throat, pain or difficulty passing urine -signs and symptoms of kidney injury like trouble passing urine or change in the amount of urine -signs and symptoms of liver injury like dark yellow or brown urine; general ill feeling or flu-like symptoms; light-colored stools; loss of appetite; nausea; right upper belly  pain; unusually weak or tired; yellowing of the eyes or skin -signs and symptoms of low blood pressure like dizziness; feeling faint or lightheaded, falls; unusually weak or tired -stomach pain -swelling of the ankles, feet, hands -unusual bleeding or bruising -vomiting Side effects that usually do not require medical attention (report to your doctor or health care professional if they continue or are bothersome): -headache -joint pain -muscle cramps or muscle pain -nausea -tiredness This list may not describe all possible side effects. Call your doctor for medical advice about side effects. You may report side effects to FDA at 1-800-FDA-1088. Where should I keep my medicine? This drug is given in a hospital or clinic and will not be stored at home. NOTE: This sheet is a summary. It may not cover all possible information. If you have questions about this medicine, talk to your doctor, pharmacist, or health care provider.  2019 Elsevier/Gold Standard (2017-06-22 13:04:32)

## 2018-11-18 NOTE — Progress Notes (Signed)
HEMATOLOGY/ONCOLOGY CLINIC NOTE  Date of Service: 11/19/2018  Patient Care Team: [name], MD as PCP - General (Internal Medicine)  CHIEF COMPLAINTS/PURPOSE OF CONSULTATION:  Large B-Cell Lymphoma  Oncologic History:  Taylor Huynh presented with extranodal involvement in the terminal ileum s/p gross tumor resection in November 2010, and was diagnosed with High Grade B-Cell Non-Hodgkin's Lymphoma. She was treated with 4 cycles of R-CHOP between 07/05/09 and 09/06/09.  HISTORY OF PRESENTING ILLNESS:   Taylor Huynh is a wonderful 78 y.o. female who has been referred to [place] by Dr. Lawerance Cruel for evaluation and management of Large B-Cell Lymphoma. She is accompanied today by her daugher. The pt reports that she is doing well overall.  The pt reports that she found a lump in her right groin just before 07/17/18. She was evaluated by her PCP on 07/18/18 with a CT A/P, as noted below. She denies this lump changing much since she first noticed it, and endorses some soreness. The pt denies fevers, chills, night sweats or unexpected weight loss. Prior to this, the patient denies any changes in her quality of life or ability to function. She notes that her right leg became swollen yesterday as well. She denies changes in her bowel movements, abdominal pains, and left leg swelling. She denies any problems passing urine nor blood in the urine. She denies heart problems or recent chest pain or SOB. She denies changes in vision or headaches.   The pt has been eating well and endorses good appetite.   The pt takes Amlodipine every other day. She takes 38m Prilosec every day, as well as Vitamin B12 replacement. She denies stroke history, lung problems, nor heart problems. She notes a history of minor seizures, which she hasn't had since she began B12 replacement several years ago. The pt has never smoked cigarettes. She notes that she does not need help with any daily  activities and lives with her husband who also functions independently. She attends a local gym regularly.   Of note prior to the patient's visit today, pt has had a CT A/P completed on 07/18/18 with results revealing Extensive abdominal-pelvic adenopathy, consistent with recurrent lymphoma. 2. Tiny hiatal hernia. 3. Pelvic floor laxity. Aortic Atherosclerosis.  Most recent lab results (08/06/18) of CBC is as follows: all values are WNL except for RBC at 3.75, HGB at 10.6, HCT at 34.0  On review of systems, pt reports sore right inguinal lump, right leg swelling, good energy levels, sore right thigh, and denies changes in vision, headaches, abdominal pains, changes in bowel habits, problems passing urine, blood in the urine, left leg swelling, fevers, chills, night sweats, noticing other lumps or bumps, back pain, pain along the spine, calf pain, and any other symptoms.   On PMHx the pt reports High Grade B-Cell Non-Hodgkin's lymphoma in 2010, HTN, Vitamin B12 deficiency, minor seizures, GERD. On Social Hx the pt denies chemical exposure.  Interval History:   Taylor PORTIERreturns today for management and evaluation of her newly diagnosed Recurrent/New High Grade B-Cell Non-Hodgkin's Lymphoma. I last saw the pt on 11/08/18 with her C4 EPOCH-R discharge. The pt reports that she is doing well overall.  The pt reports that she has been tired this week. She denies any fevers or chills. She denies any leg swelling or SOB. The pt notes that she has continued to eat at about her baseline when she is not admitted. She denies mouth sores or concerns for thrush.  Lab results today (11/19/18) of CBC w/diff and CMP is as follows: all values are WNL except for RBC at 2.89, HGB at 8.2, HCT at 27.1, RDW at 17.0, Lymphs abs at 600, Abs immature granulocytes at 0.15k, Glucose at 116, Creatinine at 1.01, Calcium at 8.8, Total Protein at 6.4, Albumin at 3.3, AST at 11, Total Bilirubin at <0.2.  On review of  systems, pt reports feeling tired, and denies leg swelling, fevers, chills, SOB, abdominal pains, lumps or bumps, mouth sores, and any other symptoms.    MEDICAL HISTORY:  Past Medical History:  Diagnosis Date   GERD (gastroesophageal reflux disease)    HH (hiatus hernia) 12/12/2011   Hypertension    Leukopenia 12/12/2011   nhl dx'd 2010   Seizures ([place]) reports "years ago"   Syncope 12/12/2011   Hx seizures - not documented - age 50; recent syncope 6/12 and x2, 12/12; negative MRI brain 1/13    SURGICAL HISTORY: Past Surgical History:  Procedure Laterality Date   COLON SURGERY     IR IMAGING GUIDED PORT INSERTION  08/23/2018    SOCIAL HISTORY: Social History   Socioeconomic History   Marital status: Married    Spouse name: Not on file   Number of children: Not on file   Years of education: Not on file   Highest education level: Not on file  Occupational History   Not on file  Social Needs   Financial resource strain: Not on file   Food insecurity:    Worry: Not on file    Inability: Not on file   Transportation needs:    Medical: Not on file    Non-medical: Not on file  Tobacco Use   Smoking status: Never Smoker   Smokeless tobacco: Never Used  Substance and Sexual Activity   Alcohol use: No   Drug use: No   Sexual activity: Not on file  Lifestyle   Physical activity:    Days per week: Not on file    Minutes per session: Not on file   Stress: Not on file  Relationships   Social connections:    Talks on phone: Not on file    Gets together: Not on file    Attends religious service: Not on file    Active member of club or organization: Not on file    Attends meetings of clubs or organizations: Not on file    Relationship status: Not on file   Intimate partner violence:    Fear of current or ex partner: Not on file    Emotionally abused: Not on file    Physically abused: Not on file    Forced sexual activity: Not on file  Other  Topics Concern   Not on file  Social History Narrative   Not on file    FAMILY HISTORY: No family history on file.  ALLERGIES:  has No Known Allergies.  MEDICATIONS:  Current Outpatient Medications  Medication Sig Dispense Refill   amLODipine (NORVASC) 10 MG tablet Take 10 mg by mouth every other day.      cholecalciferol (VITAMIN D) 1000 UNITS tablet Take 1,000 Units by mouth daily.       lidocaine-prilocaine (EMLA) cream Apply 1 application topically as needed. (Patient taking differently: Apply 1 application topically as needed (port access). ) 30 g 1   Melatonin 5 MG TABS Take 5 mg by mouth at bedtime.      omeprazole (PRILOSEC) 20 MG capsule Take 20 mg by mouth daily.  polyethylene glycol (MIRALAX / GLYCOLAX) packet Take 17 g by mouth daily as needed. 14 each 0   potassium chloride SA (K-DUR,KLOR-CON) 20 MEQ tablet Take 1 tablet (20 mEq total) by mouth 2 (two) times daily. 30 tablet 0   senna-docusate (SENOKOT-S) 8.6-50 MG tablet Take 2 tablets by mouth at bedtime as needed for mild constipation. 60 tablet 1   vitamin B-12 (CYANOCOBALAMIN) 1000 MCG tablet Take 1,000 mcg by mouth every other day.     No current facility-administered medications for this visit.     REVIEW OF SYSTEMS:    A 10+ POINT REVIEW OF SYSTEMS WAS OBTAINED including neurology, dermatology, psychiatry, cardiac, respiratory, lymph, extremities, GI, GU, Musculoskeletal, constitutional, breasts, reproductive, HEENT.  All pertinent positives are noted in the HPI.  All others are negative.   PHYSICAL EXAMINATION: ECOG PERFORMANCE STATUS: 1 - Symptomatic but completely ambulatory  . Vitals:   11/19/18 0833  BP: 135/73  Pulse: 98  Resp: 20  Temp: 98.9 F (37.2 C)  SpO2: 99%   Filed Weights   11/19/18 0833  Weight: 164 lb 3.2 oz (74.5 kg)   .Body mass index is 28.18 kg/m.  GENERAL:alert, in no acute distress and comfortable SKIN: no acute rashes, no significant lesions EYES:  conjunctiva are pink and non-injected, sclera anicteric OROPHARYNX: MMM, no exudates, no oropharyngeal erythema or ulceration NECK: supple, no JVD LYMPH:  no palpable lymphadenopathy in the cervical, axillary or inguinal regions LUNGS: clear to auscultation b/l with normal respiratory effort HEART: regular rate & rhythm ABDOMEN:  normoactive bowel sounds , non tender, not distended. No palpable hepatosplenomegaly.  Extremity: no pedal edema PSYCH: alert & oriented x 3 with fluent speech NEURO: no focal motor/sensory deficits   LABORATORY DATA:  I have reviewed the data as listed  . CBC Latest Ref Rng & Units 11/19/2018 11/08/2018 11/07/2018  WBC 4.0 - 10.5 K/uL 6.7 2.8(L) 4.9  Hemoglobin 12.0 - 15.0 g/dL 8.2(L) 7.8(L) 8.4(L)  Hematocrit 36.0 - 46.0 % 27.1(L) 25.0(L) 27.1(L)  Platelets 150 - 400 K/uL 215 360 368    . CMP Latest Ref Rng & Units 11/19/2018 11/08/2018 11/07/2018  Glucose 70 - 99 mg/dL 116(H) 105(H) 108(H)  BUN 8 - 23 mg/dL _0 Creatinine 0.44 - 1.00 mg/dL 1.01(H) 0.68 0.81  Sodium 135 - 145 mmol/L 142 143 143  Potassium 3.5 - 5.1 mmol/L 3.8 3.3(L) 3.8  Chloride 98 - 111 mmol/L 109 113(H) 115(H)  CO2 22 - 32 mmol/L 24 24 21(L)  Calcium 8.9 - 10.3 mg/dL 8.8(L) 8.5(L) 8.6(L)  Total Protein 6.5 - 8.1 g/dL 6.4(L) - -  Total Bilirubin 0.3 - 1.2 mg/dL <0.2(L) - -  Alkaline Phos 38 - 126 U/L 74 - -  AST 15 - 41 U/L 11(L) - -  ALT 0 - 44 U/L 10 - -   08/06/18 Biopsy:    06/01/09 Pathology:     07/18/18 CBC w/diff:    RADIOGRAPHIC STUDIES: I have personally reviewed the radiological images as listed and agreed with the findings in the report.  08/14/18 US Venous RLE:   Nm Pet Image Restag (ps) Skull Base To Thigh  Result Date: 10/24/2018 CLINICAL DATA:  Subsequent treatment strategy for restaging of large B-cell lymphoma. Status post 3 cycles of chemotherapy. EXAM: NUCLEAR MEDICINE PET SKULL BASE TO THIGH TECHNIQUE: 8.6 mCi F-18 FDG was injected  intravenously. Full-ring PET imaging was performed from the skull base to thigh after the radiotracer. CT data was obtained and used for  attenuation correction and anatomic localization. Fasting blood glucose: 101 mg/dl COMPARISON:  PET 08/22/2018. FINDINGS: Mediastinal blood pool activity: SUV max 2.4 Liver activity: S.U.V. max 3.6 NECK: No areas of abnormal hypermetabolism. Incidental CT findings: No cervical adenopathy. Bilateral carotid atherosclerosis. CHEST: No pulmonary parenchymal or thoracic nodal hypermetabolism. Incidental CT findings: Aortic and proximal LAD coronary artery calcification. Right Port-A-Cath tip at superior caval/atrial junction. No thoracic adenopathy. Tiny hiatal hernia. ABDOMEN/PELVIS: Resolution of previously described hypermetabolic abdominal adenopathy. Right common iliac node measures 1.2 cm and a S.U.V. max of 12.1 on image 145/4. Adenopathy in this area on the prior measured 3.5 cm and a S.U.V. max of 39.3. Hypermetabolic residual right inguinal nodes. Example at 1.8 cm and a S.U.V. max of 9.7 on image 178/4. Compare 4.6 cm and a S.U.V. max of 43.7 on the prior. Incidental CT findings: Normal adrenal glands. Cholecystectomy. Mild hepatic steatosis. Low-density left renal lesion is likely a cyst. Abdominal aortic atherosclerosis. 9 mm aortocaval node is decreased in size from 2.5 cm on the prior (when remeasured). Hysterectomy. Pelvic floor laxity. Enterotomy. Fatty replacement throughout the pancreas. SKELETON: Diffuse marrow hypermetabolism is likely due to stimulation by chemotherapy. The previously described focal areas of marrow hypermetabolism may have resolved or be obscured by the diffuse process. Incidental CT findings: none IMPRESSION: 1. 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 Residual right pelvic hypermetabolic nodes. ([place] 5). 2. No new sites of disease. 3. Marrow hypermetabolism is likely due to stimulation by chemotherapy. 4. Coronary artery  atherosclerosis. Aortic Atherosclerosis (ICD10-I70.0). Electronically Signed   By: Abigail Miyamoto M.D.   On: 10/24/2018 12:39    ASSESSMENT & PLAN:   78 y.o. female with  1. 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 Newly Diagnosed Recurrent/New High Grade B-Cell Non-Hodgkin's Lymphoma, Stage IV FISH negative for findings of double hit lymphoma  07/18/18 CT A/P revealed Extensive abdominal-pelvic adenopathy, consistent with recurrent lymphoma. 2. Tiny hiatal hernia. 3. Pelvic floor laxity. Aortic Atherosclerosis.  Labs upon initial presentation from1/14/20, WBC normal at 4.3k, HGB at 10.6, PLT normal at 201k  08/06/18 Right Inguinal LN Biopsy which revealed Large B-Cell Lymphoma with a Ki67 of 90%. FISH is negative for rearrangements of BCL2, BCL6, and MYC. 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  08/13/18 Hep B and Hep C negative  08/21/18 ECHO which revealed LV EF of 50-55%  08/22/18 PET/CT revealedProgression, since 25/95/6387, of hypermetabolic abdominopelvic adenopathy, consistent with active lymphoma. 2. CT occult hypermetabolic osseous foci, also most consistent with active lymphoma. 3. No evidence of soft tissue disease above the diaphragm. 4. Focus of hypermetabolism within the anus is likely physiologic. Consider physical exam correlation. 5. Aortic Atherosclerosis.  10/24/18 PET/CT revealed Marked response to therapy of abdominopelvic lymphoma. Residual right pelvic hypermetabolic nodes. ([place] 5). 2. No new sites of disease. 3. Marrow  hypermetabolism is likely due to stimulation by chemotherapy. 4. Coronary artery atherosclerosis. Aortic Atherosclerosis.   3. RLE swelling r/o DVT. Could be from lymphedema related to her new/recurrent NHL 1/21/10US Venous RLE to rule out DVT - reviewed - no VTE  4. Hypokalemia.resolved- likely from steroids.  5. Pancytopenia due to  chemotherapy and DLBCL  6. 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 No evidence of bleeding  7. DVT Px - lovenox [place]  PLAN: -Discussed pt labwork today, 11/19/18; HGB slightly improved to 8.2, WBC normal at 6.7k. Other counts are stable. -The pt has no prohibitive toxicities from C4 dose reduced EPOCH-R. Dose reduced for age and previous chemotherapy exposure -Planning for C5 EPOCH-R admission on 11/25/18 -Have continued to discuss the recommendation to complete 6 cycles with the pt , however the pt continues to note that she is very keen to stop treatment after 5 cycles. -Recommended that the pt stay well hydrated and well nourished -Will continue watching HGB, and will support with PRBC transfusion if necessary for hgb<7.5. Pt denies light headedness, fatigue, and dizziness at this time.  -Miralax and Senokot as needed -Recommended that the pt continue to eat well, drink at least 48-64 oz of water each day, and walk 20-30 minutes each day. -Will see the pt back in one week with C5 admission   Inpatient admission for [facility] C5 from 11/25/2018 for 5 days Outpatient Rituxan and neulasta on 12/02/2018 RTC with Dr Irene Limbo with labs on 12/09/2018   All of the patients questions were answered with apparent satisfaction. The patient knows to call the clinic with any problems, questions or concerns.  The total time spent in the appt was 25 minutes and more than 50% was on counseling and direct patient cares.    Sullivan Lone MD MS AAHIVMS [facility] Hematology/Oncology Physician [facility]  (Office):       [phone] (Work cell):  [phone] (Fax):            [phone]  11/19/2018 9:29 AM  I, Baldwin [place], am acting as a scribe for Dr. Sullivan Lone.   .I have reviewed the above documentation for accuracy and completeness, and I agree with the above. Brunetta Genera MD

## 2018-11-19 ENCOUNTER — Other Ambulatory Visit: Payer: Self-pay

## 2018-11-19 ENCOUNTER — Inpatient Hospital Stay (HOSPITAL_BASED_OUTPATIENT_CLINIC_OR_DEPARTMENT_OTHER): Payer: Medicare Other | Admitting: Hematology

## 2018-11-19 ENCOUNTER — Telehealth: Payer: Self-pay | Admitting: *Deleted

## 2018-11-19 ENCOUNTER — Telehealth: Payer: Self-pay | Admitting: Hematology

## 2018-11-19 ENCOUNTER — Inpatient Hospital Stay: Payer: Medicare Other

## 2018-11-19 VITALS — BP 135/73 | HR 98 | Temp 98.9°F | Resp 20 | Ht 64.0 in | Wt 164.2 lb

## 2018-11-19 DIAGNOSIS — C8338 Diffuse large B-cell lymphoma, lymph nodes of multiple sites: Secondary | ICD-10-CM | POA: Diagnosis not present

## 2018-11-19 DIAGNOSIS — I700 Atherosclerosis of aorta: Secondary | ICD-10-CM

## 2018-11-19 DIAGNOSIS — M7989 Other specified soft tissue disorders: Secondary | ICD-10-CM | POA: Diagnosis not present

## 2018-11-19 DIAGNOSIS — I10 Essential (primary) hypertension: Secondary | ICD-10-CM

## 2018-11-19 DIAGNOSIS — K219 Gastro-esophageal reflux disease without esophagitis: Secondary | ICD-10-CM

## 2018-11-19 DIAGNOSIS — I2510 Atherosclerotic heart disease of native coronary artery without angina pectoris: Secondary | ICD-10-CM

## 2018-11-19 DIAGNOSIS — D72819 Decreased white blood cell count, unspecified: Secondary | ICD-10-CM

## 2018-11-19 DIAGNOSIS — K449 Diaphragmatic hernia without obstruction or gangrene: Secondary | ICD-10-CM

## 2018-11-19 DIAGNOSIS — E538 Deficiency of other specified B group vitamins: Secondary | ICD-10-CM

## 2018-11-19 DIAGNOSIS — D61818 Other pancytopenia: Secondary | ICD-10-CM

## 2018-11-19 DIAGNOSIS — Z8669 Personal history of other diseases of the nervous system and sense organs: Secondary | ICD-10-CM

## 2018-11-19 LAB — CBC WITH DIFFERENTIAL/PLATELET
Abs Immature Granulocytes: 0.15 10*3/uL — ABNORMAL HIGH (ref 0.00–0.07)
Basophils Absolute: 0 10*3/uL (ref 0.0–0.1)
Basophils Relative: 1 %
Eosinophils Absolute: 0.1 10*3/uL (ref 0.0–0.5)
Eosinophils Relative: 2 %
HCT: 27.1 % — ABNORMAL LOW (ref 36.0–46.0)
Hemoglobin: 8.2 g/dL — ABNORMAL LOW (ref 12.0–15.0)
Immature Granulocytes: 2 %
Lymphocytes Relative: 9 %
Lymphs Abs: 0.6 10*3/uL — ABNORMAL LOW (ref 0.7–4.0)
MCH: 28.4 pg (ref 26.0–34.0)
MCHC: 30.3 g/dL (ref 30.0–36.0)
MCV: 93.8 fL (ref 80.0–100.0)
Monocytes Absolute: 0.7 10*3/uL (ref 0.1–1.0)
Monocytes Relative: 10 %
Neutro Abs: 5.2 10*3/uL (ref 1.7–7.7)
Neutrophils Relative %: 76 %
Platelets: 215 10*3/uL (ref 150–400)
RBC: 2.89 MIL/uL — ABNORMAL LOW (ref 3.87–5.11)
RDW: 17 % — ABNORMAL HIGH (ref 11.5–15.5)
WBC: 6.7 10*3/uL (ref 4.0–10.5)
nRBC: 0 % (ref 0.0–0.2)

## 2018-11-19 LAB — CMP (CANCER CENTER ONLY)
ALT: 10 U/L (ref 0–44)
AST: 11 U/L — ABNORMAL LOW (ref 15–41)
Albumin: 3.3 g/dL — ABNORMAL LOW (ref 3.5–5.0)
Alkaline Phosphatase: 74 U/L (ref 38–126)
Anion gap: 9 (ref 5–15)
BUN: 12 mg/dL (ref 8–23)
CO2: 24 mmol/L (ref 22–32)
Calcium: 8.8 mg/dL — ABNORMAL LOW (ref 8.9–10.3)
Chloride: 109 mmol/L (ref 98–111)
Creatinine: 1.01 mg/dL — ABNORMAL HIGH (ref 0.44–1.00)
GFR, Est AFR Am: >60 mL/min (ref >60)
GFR, Estimated: 53 mL/min — ABNORMAL LOW (ref >60)
Glucose, Bld: 116 mg/dL — ABNORMAL HIGH (ref 70–99)
Potassium: 3.8 mmol/L (ref 3.5–5.1)
Sodium: 142 mmol/L (ref 135–145)
Total Bilirubin: <0.2 mg/dL — ABNORMAL LOW (ref 0.3–1.2)
Total Protein: 6.4 g/dL — ABNORMAL LOW (ref 6.5–8.1)

## 2018-11-19 LAB — LACTATE DEHYDROGENASE: LDH: 215 U/L — ABNORMAL HIGH (ref 98–192)

## 2018-11-19 NOTE — Telephone Encounter (Signed)
Patient scheduled for inpatient admission AM of 5/4 per [facility] in bed placement. Notified #inpatient chemo and pharmacy. Patient contacted and told to expect call in AM on 5/4. Patient verbalized understanding.

## 2018-11-19 NOTE — Telephone Encounter (Signed)
Scheduled appt per 4/28 los. °

## 2018-11-25 ENCOUNTER — Inpatient Hospital Stay (HOSPITAL_COMMUNITY)
Admission: AD | Admit: 2018-11-25 | Discharge: 2018-11-29 | DRG: 846 | Disposition: A | Discharge status: Home / Self Care | Payer: Medicare Other | Attending: Hematology | Admitting: Hematology

## 2018-11-25 ENCOUNTER — Other Ambulatory Visit: Payer: Self-pay

## 2018-11-25 ENCOUNTER — Encounter (HOSPITAL_COMMUNITY): Payer: Self-pay | Admitting: Oncology

## 2018-11-25 DIAGNOSIS — C8338 Diffuse large B-cell lymphoma, lymph nodes of multiple sites: Secondary | ICD-10-CM | POA: Diagnosis not present

## 2018-11-25 DIAGNOSIS — C8339 Diffuse large B-cell lymphoma, extranodal and solid organ sites: Secondary | ICD-10-CM | POA: Diagnosis present

## 2018-11-25 DIAGNOSIS — T451X5A Adverse effect of antineoplastic and immunosuppressive drugs, initial encounter: Secondary | ICD-10-CM | POA: Diagnosis present

## 2018-11-25 DIAGNOSIS — D630 Anemia in neoplastic disease: Secondary | ICD-10-CM | POA: Diagnosis present

## 2018-11-25 DIAGNOSIS — K219 Gastro-esophageal reflux disease without esophagitis: Secondary | ICD-10-CM | POA: Diagnosis present

## 2018-11-25 DIAGNOSIS — E876 Hypokalemia: Secondary | ICD-10-CM

## 2018-11-25 DIAGNOSIS — D649 Anemia, unspecified: Secondary | ICD-10-CM | POA: Diagnosis not present

## 2018-11-25 DIAGNOSIS — K5900 Constipation, unspecified: Secondary | ICD-10-CM | POA: Diagnosis present

## 2018-11-25 DIAGNOSIS — I10 Essential (primary) hypertension: Secondary | ICD-10-CM | POA: Diagnosis present

## 2018-11-25 DIAGNOSIS — Z7189 Other specified counseling: Secondary | ICD-10-CM

## 2018-11-25 DIAGNOSIS — C8330 Diffuse large B-cell lymphoma, unspecified site: Secondary | ICD-10-CM

## 2018-11-25 DIAGNOSIS — Z5111 Encounter for antineoplastic chemotherapy: Secondary | ICD-10-CM

## 2018-11-25 DIAGNOSIS — R040 Epistaxis: Secondary | ICD-10-CM | POA: Diagnosis not present

## 2018-11-25 DIAGNOSIS — D61810 Antineoplastic chemotherapy induced pancytopenia: Secondary | ICD-10-CM | POA: Diagnosis present

## 2018-11-25 LAB — CBC WITH DIFFERENTIAL/PLATELET
Abs Immature Granulocytes: 0.02 10*3/uL (ref 0.00–0.07)
Basophils Absolute: 0 10*3/uL (ref 0.0–0.1)
Basophils Relative: 0 %
Eosinophils Absolute: 0.1 10*3/uL (ref 0.0–0.5)
Eosinophils Relative: 2 %
HCT: 26.4 % — ABNORMAL LOW (ref 36.0–46.0)
Hemoglobin: 7.9 g/dL — ABNORMAL LOW (ref 12.0–15.0)
Immature Granulocytes: 0 %
Lymphocytes Relative: 14 %
Lymphs Abs: 0.7 10*3/uL (ref 0.7–4.0)
MCH: 28.1 pg (ref 26.0–34.0)
MCHC: 29.9 g/dL — ABNORMAL LOW (ref 30.0–36.0)
MCV: 94 fL (ref 80.0–100.0)
Monocytes Absolute: 0.6 10*3/uL (ref 0.1–1.0)
Monocytes Relative: 13 %
Neutro Abs: 3.2 10*3/uL (ref 1.7–7.7)
Neutrophils Relative %: 71 %
Platelets: 342 10*3/uL (ref 150–400)
RBC: 2.81 MIL/uL — ABNORMAL LOW (ref 3.87–5.11)
RDW: 16.9 % — ABNORMAL HIGH (ref 11.5–15.5)
WBC: 4.6 10*3/uL (ref 4.0–10.5)
nRBC: 0 % (ref 0.0–0.2)

## 2018-11-25 LAB — COMPREHENSIVE METABOLIC PANEL
ALT: 11 U/L (ref 0–44)
AST: 12 U/L — ABNORMAL LOW (ref 15–41)
Albumin: 3.5 g/dL (ref 3.5–5.0)
Alkaline Phosphatase: 58 U/L (ref 38–126)
Anion gap: 7 (ref 5–15)
BUN: 20 mg/dL (ref 8–23)
CO2: 22 mmol/L (ref 22–32)
Calcium: 8.5 mg/dL — ABNORMAL LOW (ref 8.9–10.3)
Chloride: 110 mmol/L (ref 98–111)
Creatinine, Ser: 0.91 mg/dL (ref 0.44–1.00)
GFR calc Af Amer: >60 mL/min (ref >60)
GFR calc non Af Amer: 60 mL/min — ABNORMAL LOW (ref >60)
Glucose, Bld: 87 mg/dL (ref 70–99)
Potassium: 3.5 mmol/L (ref 3.5–5.1)
Sodium: 139 mmol/L (ref 135–145)
Total Bilirubin: 0.3 mg/dL (ref 0.3–1.2)
Total Protein: 6.2 g/dL — ABNORMAL LOW (ref 6.5–8.1)

## 2018-11-25 MED ORDER — SODIUM CHLORIDE 0.9 % IV SOLN
INTRAVENOUS | Status: DC
  Administered 2018-11-25: 15:00:00 via INTRAVENOUS

## 2018-11-25 MED ORDER — ENOXAPARIN SODIUM 40 MG/0.4ML ~~LOC~~ SOLN
40.0000 mg | SUBCUTANEOUS | Status: DC
  Administered 2018-11-25 – 2018-11-28 (×4): 40 mg via SUBCUTANEOUS
  Filled 2018-11-25 (×4): qty 0.4

## 2018-11-25 MED ORDER — SODIUM CHLORIDE 0.9% FLUSH
10.0000 mL | Freq: Two times a day (BID) | INTRAVENOUS | Status: DC
  Administered 2018-11-25 – 2018-11-28 (×3): 10 mL

## 2018-11-25 MED ORDER — COLD PACK MISC ONCOLOGY
1.0000 | Freq: Once | Status: DC | PRN
  Filled 2018-11-25: qty 1

## 2018-11-25 MED ORDER — PANTOPRAZOLE SODIUM 40 MG PO TBEC
40.0000 mg | DELAYED_RELEASE_TABLET | Freq: Every day | ORAL | Status: DC
  Administered 2018-11-26 – 2018-11-29 (×4): 40 mg via ORAL
  Filled 2018-11-25 (×4): qty 1

## 2018-11-25 MED ORDER — HOT PACK MISC ONCOLOGY
1.0000 | Freq: Once | Status: DC | PRN
  Filled 2018-11-25: qty 1

## 2018-11-25 MED ORDER — PREDNISONE 50 MG PO TABS
60.0000 mg | ORAL_TABLET | Freq: Every day | ORAL | Status: AC
  Administered 2018-11-25 – 2018-11-29 (×5): 60 mg via ORAL
  Filled 2018-11-25 (×5): qty 1

## 2018-11-25 MED ORDER — AMLODIPINE BESYLATE 10 MG PO TABS
10.0000 mg | ORAL_TABLET | ORAL | Status: DC
  Administered 2018-11-26 – 2018-11-28 (×2): 10 mg via ORAL
  Filled 2018-11-25 (×3): qty 1

## 2018-11-25 MED ORDER — VINCRISTINE SULFATE CHEMO INJECTION 1 MG/ML
Freq: Once | INTRAVENOUS | Status: AC
  Administered 2018-11-25: 15:00:00 via INTRAVENOUS
  Filled 2018-11-25: qty 6

## 2018-11-25 MED ORDER — POLYETHYLENE GLYCOL 3350 17 G PO PACK
17.0000 g | PACK | Freq: Every day | ORAL | Status: DC | PRN
  Filled 2018-11-25 (×2): qty 1

## 2018-11-25 MED ORDER — VITAMIN B-12 1000 MCG PO TABS
1000.0000 ug | ORAL_TABLET | ORAL | Status: DC
  Administered 2018-11-26 – 2018-11-28 (×2): 1000 ug via ORAL
  Filled 2018-11-25 (×3): qty 1

## 2018-11-25 MED ORDER — SODIUM CHLORIDE 0.9 % IV SOLN
Freq: Once | INTRAVENOUS | Status: AC
  Administered 2018-11-25: 8 mg via INTRAVENOUS
  Filled 2018-11-25: qty 4

## 2018-11-25 MED ORDER — POTASSIUM CHLORIDE CRYS ER 20 MEQ PO TBCR
20.0000 meq | EXTENDED_RELEASE_TABLET | Freq: Two times a day (BID) | ORAL | Status: DC
  Administered 2018-11-25 – 2018-11-29 (×7): 20 meq via ORAL
  Filled 2018-11-25 (×8): qty 1

## 2018-11-25 MED ORDER — SENNOSIDES-DOCUSATE SODIUM 8.6-50 MG PO TABS: 2.0000 | ORAL_TABLET | Freq: Every evening | ORAL | Status: DC | PRN

## 2018-11-25 MED ORDER — LIDOCAINE-PRILOCAINE 2.5-2.5 % EX CREA: 1.0000 "application " | TOPICAL_CREAM | CUTANEOUS | Status: DC | PRN

## 2018-11-25 MED ORDER — SODIUM CHLORIDE 0.9% FLUSH: 10.0000 mL | INTRAVENOUS | Status: DC | PRN

## 2018-11-25 NOTE — H&P (Addendum)
HEMATOLOGY/ONCOLOGY CONSULTATION NOTE  Date of Service: 11/25/2018  Patient Care Team: [name], MD as PCP - General (Internal Medicine)  CHIEF COMPLAINTS/PURPOSE OF CONSULTATION:  C5 dose reduced EPOCH-R for DLBCL  HISTORY OF PRESENTING ILLNESS:   Taylor Huynh is a wonderful 78 y.o. 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 The pt reports that she is doing well overall.   The pt reports that she has not developed any new concerns in the interim, nor concerns for infections. She denies leg swelling, fevers, chills or night sweats.  Lab results today (11/25/18) of CBC w/diff and CMP reviewed  On review of systems, the patient reports stable energy levels.  She denies fevers, chills, night sweats, lower extremity edema, concerns for infections, abdominal pain, nausea, vomiting, or any other symptoms.  MEDICAL HISTORY:  Past Medical History:  Diagnosis Date  . GERD (gastroesophageal reflux disease)   . HH (hiatus hernia) 12/12/2011  . Hypertension   . Leukopenia 12/12/2011  . nhl dx'd 2010  . Seizures ([place]) reports "years ago"  . Syncope 12/12/2011   Hx seizures - not documented - age 89; recent syncope 6/12 and x2, 12/12; negative MRI brain 1/13    SURGICAL HISTORY: Past Surgical History:  Procedure Laterality Date  . COLON SURGERY    . IR IMAGING GUIDED PORT INSERTION  08/23/2018    SOCIAL HISTORY: Social History   Socioeconomic History  . Marital status: Married    Spouse name: Not on file  . Number of children: Not on file  . Years of education: Not on file  . Highest education level: Not on file  Occupational History  . Not on file  Social Needs  . Financial resource strain: Not on file  . Food insecurity:    Worry: Not on file    Inability: Not on file  . Transportation needs:    Medical: Not on file    Non-medical: Not on file  Tobacco Use  . Smoking status: Never  Smoker  . Smokeless tobacco: Never Used  Substance and Sexual Activity  . Alcohol use: No  . Drug use: No  . Sexual activity: Not on file  Lifestyle  . Physical activity:    Days per week: Not on file    Minutes per session: Not on file  . Stress: Not on file  Relationships  . Social connections:    Talks on phone: Not on file    Gets together: Not on file    Attends religious service: Not on file    Active member of club or organization: Not on file    Attends meetings of clubs or organizations: Not on file    Relationship status: Not on file  . Intimate partner violence:    Fear of current or ex partner: Not on file    Emotionally abused: Not on file    Physically abused: Not on file    Forced sexual activity: Not on file  Other Topics Concern  . Not on file  Social History Narrative  . Not on file    FAMILY HISTORY: History reviewed. No pertinent family history.  ALLERGIES:  has No Known Allergies.  MEDICATIONS:  No current facility-administered medications for this encounter.     REVIEW OF SYSTEMS:    10 Point review of Systems was done is negative except as noted above.  PHYSICAL EXAMINATION: ECOG PERFORMANCE STATUS: 1 - Symptomatic but completely  ambulatory  . There were no vitals filed for this visit. There were no vitals filed for this visit. .There is no height or weight on file to calculate BMI.  GENERAL:alert, in no acute distress and comfortable SKIN: no acute rashes, no significant lesions EYES: conjunctiva are pink and non-injected, sclera anicteric OROPHARYNX: MMM, no exudates, no oropharyngeal erythema or ulceration NECK: supple, no JVD LYMPH:  no palpable lymphadenopathy in the cervical, axillary or inguinal regions LUNGS: clear to auscultation b/l with normal respiratory effort HEART: regular rate & rhythm ABDOMEN:  normoactive bowel sounds , non tender, not distended. Extremity: no pedal edema PSYCH: alert & oriented x 3 with fluent  speech NEURO: no focal motor/sensory deficits  LABORATORY DATA:  I have reviewed the data as listed  . CBC Latest Ref Rng & Units 11/19/2018 11/08/2018 11/07/2018  WBC 4.0 - 10.5 K/uL 6.7 2.8(L) 4.9  Hemoglobin 12.0 - 15.0 g/dL 8.2(L) 7.8(L) 8.4(L)  Hematocrit 36.0 - 46.0 % 27.1(L) 25.0(L) 27.1(L)  Platelets 150 - 400 K/uL 215 360 368    . CMP Latest Ref Rng & Units 11/19/2018 11/08/2018 11/07/2018  Glucose 70 - 99 mg/dL 116(H) 105(H) 108(H)  BUN 8 - 23 mg/dL 12 15 20  Creatinine 0.44 - 1.00 mg/dL 1.01(H) 0.68 0.81  Sodium 135 - 145 mmol/L 142 143 143  Potassium 3.5 - 5.1 mmol/L 3.8 3.3(L) 3.8  Chloride 98 - 111 mmol/L 109 113(H) 115(H)  CO2 22 - 32 mmol/L 24 24 21(L)  Calcium 8.9 - 10.3 mg/dL 8.8(L) 8.5(L) 8.6(L)  Total Protein 6.5 - 8.1 g/dL 6.4(L) - -  Total Bilirubin 0.3 - 1.2 mg/dL <0.2(L) - -  Alkaline Phos 38 - 126 U/L 74 - -  AST 15 - 41 U/L 11(L) - -  ALT 0 - 44 U/L 10 - -     RADIOGRAPHIC STUDIES: I have personally reviewed the radiological images as listed and agreed with the findings in the report. No results found.  ASSESSMENT & PLAN:  78 y.o. female with  1. 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 2. Tiny hiatal hernia. 3. Pelvic floor laxity. Aortic Atherosclerosis.  Labs upon initial presentation from1/14/20, WBC normal at  4.3k, HGB at 10.6, PLT normal at 201k  08/06/18 Right Inguinal LN Biopsy which revealed Large B-Cell Lymphoma with a Ki67 of 90%. FISH is negative for rearrangements of BCL2, BCL6, and MYC. 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  08/13/18 Hep B and Hep C negative  08/21/18 ECHO which revealed LV EF of 50-55%  08/22/18 PET/CT revealedProgression, since 07/18/2018, of hypermetabolic abdominopelvic adenopathy, consistent with active lymphoma. 2. CT occult hypermetabolic osseous foci, also most consistent with active lymphoma. 3. No evidence of soft tissue disease above the diaphragm. 4. Focus of hypermetabolism within the anus is likely physiologic. Consider physical exam correlation. 5. Aortic Atherosclerosis.  3. RLE swelling r/o DVT. Could be from lymphedema related to her new/recurrent NHL 1/21/10US Venous RLE to rule out   DVT - reviewed - no VTE Now resolved  4. Hypokalemia-resolved  5. Pancytopenia due to chemotherapy and DLBCL  6. 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 No evidence of bleeding  7. DVT Px - lovenox Delta  PLAN: -PET scan from 10/24/2018 revealed a market response to therapy of the abdominopelvic lymphoma.  Residual right pelvic hypermetabolic nodes. ([place] 5). 2. No new sites of disease. 3. Marrow hypermetabolism is likely due to stimulation by chemotherapy. 4. Coronary artery atherosclerosis. Aortic Atherosclerosis.  Recommend for the patient to complete at least 2 additional cycles of chemotherapy.  The patient is agreeable. -Discussed pt labwork today, 11/25/18 -The pt has no prohibitive toxicities from starting C5D1 dose reduced EPOCH-R at this time. Dose reduced for age and previous chemotherapy exposure. -chemotherapy orders placed, reviewed and signed -Continue eating well, staying hydrated, and staying as active as reasonably possible  -Continue with salt and baking soda mouthwashes 4-5 times each day -Will continue watching  HGB, and will support with PRBC transfusion if necessary for hgb<8. Pt denies light headedness, fatigue, and dizziness at this time.  -Miralax -Encouraged patient to optimize po intake -Outpatient Rituxan and Neulasta on 12/02/18 -Will see pt back in clinic on 12/09/18   All of the patients questions were answered with apparent satisfaction. The patient knows to call the clinic with any problems, questions or concerns.  Kristin Curcio, DNP, AGPCNP-BC, AOCNP   ADDENDUM  .Patient was Personally and independently interviewed, examined and relevant elements of the history of present illness were reviewed in details and an assessment and plan was created. All elements of the patient's history of present illness , assessment and plan were discussed in details with Kristin Curcio, DNP. The above documentation reflects our combined findings assessment and plan.  Gautam Kale MD MS  

## 2018-11-25 NOTE — Progress Notes (Signed)
Chemotherapy dosing based on patient's BSA and normal dosing for Doxorubicin, Etoposide and Vincristine independently checked with Nancy Marus, RN.

## 2018-11-26 LAB — BASIC METABOLIC PANEL
Anion gap: 4 — ABNORMAL LOW (ref 5–15)
BUN: 22 mg/dL (ref 8–23)
CO2: 23 mmol/L (ref 22–32)
Calcium: 8.6 mg/dL — ABNORMAL LOW (ref 8.9–10.3)
Chloride: 113 mmol/L — ABNORMAL HIGH (ref 98–111)
Creatinine, Ser: 0.84 mg/dL (ref 0.44–1.00)
GFR calc Af Amer: >60 mL/min (ref >60)
GFR calc non Af Amer: >60 mL/min (ref >60)
Glucose, Bld: 130 mg/dL — ABNORMAL HIGH (ref 70–99)
Potassium: 4.1 mmol/L (ref 3.5–5.1)
Sodium: 140 mmol/L (ref 135–145)

## 2018-11-26 LAB — CBC
HCT: 25 % — ABNORMAL LOW (ref 36.0–46.0)
Hemoglobin: 7.6 g/dL — ABNORMAL LOW (ref 12.0–15.0)
MCH: 28.4 pg (ref 26.0–34.0)
MCHC: 30.4 g/dL (ref 30.0–36.0)
MCV: 93.3 fL (ref 80.0–100.0)
Platelets: 313 10*3/uL (ref 150–400)
RBC: 2.68 MIL/uL — ABNORMAL LOW (ref 3.87–5.11)
RDW: 16.8 % — ABNORMAL HIGH (ref 11.5–15.5)
WBC: 4.5 10*3/uL (ref 4.0–10.5)
nRBC: 0 % (ref 0.0–0.2)

## 2018-11-26 MED ORDER — VINCRISTINE SULFATE CHEMO INJECTION 1 MG/ML
Freq: Once | INTRAVENOUS | Status: AC
  Administered 2018-11-26: 13:00:00 via INTRAVENOUS
  Filled 2018-11-26: qty 6

## 2018-11-26 MED ORDER — SODIUM CHLORIDE 0.9 % IV SOLN
Freq: Once | INTRAVENOUS | Status: AC
  Administered 2018-11-26: 18 mg via INTRAVENOUS
  Filled 2018-11-26: qty 4

## 2018-11-26 MED ORDER — VITAMIN D 25 MCG (1000 UNIT) PO TABS
1000.0000 [IU] | ORAL_TABLET | Freq: Every day | ORAL | Status: DC
  Administered 2018-11-26 – 2018-11-29 (×4): 1000 [IU] via ORAL
  Filled 2018-11-26 (×4): qty 1

## 2018-11-26 NOTE — Progress Notes (Signed)
Chemo dosages and calculations verified with Adline Peals, RN.

## 2018-11-26 NOTE — Progress Notes (Addendum)
HEMATOLOGY/ONCOLOGY INPATIENT PROGRESS NOTE  Date of Service: 11/26/2018  Inpatient Attending: .Brunetta Genera, MD   SUBJECTIVE:   Taylor Huynh is sitting in the recliner chair this morning. The pt reports that she is doing well overall.   The pt reports that she is enjoying some improved energy levels. She notes that she is eating well and has been ambulating periodically. Bowels moving.   Lab results today (11/26/2018) of CBC w/diff and CMP is as follows: all values are WNL except for RBC at 2.68, HGB at 7.6, HCT at 25.0, RDW 16.8, chloride at 113, Glucose at 130, calcium 8.6.  On review of systems, pt reports stable energy levels and denies leg swelling, abdominal pain, and any other symptoms. States that she needs MiraLax today for constipation.    OBJECTIVE:  NAD  PHYSICAL EXAMINATION: . Vitals:   11/25/18 1250 11/25/18 1412 11/25/18 2033 11/26/18 0439  BP: (!) 154/72 (!) 157/70 126/66 130/74  Pulse: 85 82 83 81  Resp: _0 Temp: 97.8 F (36.6 C) 97.9 F (36.6 C) 98.4 F (36.9 C) 97.9 F (36.6 C)  TempSrc: Oral Oral    SpO2: 100% 100% 99% 100%   BP 130/74 (BP Location: Right Arm)   Pulse 81   Temp 97.9 F (36.6 C)   Resp 17   LMP  (LMP Unknown)   SpO2 100%   GENERAL:alert, in no acute distress and comfortable SKIN: no acute rashes, no significant lesions EYES: conjunctiva are pink and non-injected, sclera anicteric OROPHARYNX: MMM, no exudate, no oropharyngeal erythema or ulceration NECK: supple, no JVD LYMPH:  no palpable lymphadenopathy in the cervical, axillary or inguinal regions LUNGS: clear to auscultation b/l with normal respiratory effort HEART: regular rate & rhythm ABDOMEN:  normoactive bowel sounds , non tender, not distended. No palpable hepatosplenomegaly.  Extremity: no pedal edema PSYCH: alert & oriented x 3 with fluent speech NEURO: no focal motor/sensory deficits    MEDICAL HISTORY:  Past Medical History:   Diagnosis Date  . GERD (gastroesophageal reflux disease)   . HH (hiatus hernia) 12/12/2011  . Hypertension   . Leukopenia 12/12/2011  . nhl dx'd 2010  . Seizures ([place]) reports "years ago"  . Syncope 12/12/2011   Hx seizures - not documented - age 32; recent syncope 6/12 and x2, 12/12; negative MRI brain 1/13    SURGICAL HISTORY: Past Surgical History:  Procedure Laterality Date  . COLON SURGERY    . IR IMAGING GUIDED PORT INSERTION  08/23/2018    SOCIAL HISTORY: Social History   Socioeconomic History  . Marital status: Married    Spouse name: Not on file  . Number of children: Not on file  . Years of education: Not on file  . Highest education level: Not on file  Occupational History  . Not on file  Social Needs  . Financial resource strain: Not on file  . Food insecurity:    Worry: Not on file    Inability: Not on file  . Transportation needs:    Medical: Not on file    Non-medical: Not on file  Tobacco Use  . Smoking status: Never Smoker  . Smokeless tobacco: Never Used  Substance and Sexual Activity  . Alcohol use: No  . Drug use: No  . Sexual activity: Not on file  Lifestyle  . Physical activity:    Days per week: Not on file    Minutes per session: Not on file  . Stress: Not  on file  Relationships  . Social connections:    Talks on phone: Not on file    Gets together: Not on file    Attends religious service: Not on file    Active member of club or organization: Not on file    Attends meetings of clubs or organizations: Not on file    Relationship status: Not on file  . Intimate partner violence:    Fear of current or ex partner: Not on file    Emotionally abused: Not on file    Physically abused: Not on file    Forced sexual activity: Not on file  Other Topics Concern  . Not on file  Social History Narrative  . Not on file    FAMILY HISTORY: History reviewed. No pertinent family history.  ALLERGIES:  has No Known Allergies.  MEDICATIONS:   Scheduled Meds: . amLODipine  10 mg Oral QODAY  . DOXOrubicin/vinCRIStine/etoposide CHEMO IV infusion for Inpatient CI   Intravenous Once  . DOXOrubicin/vinCRIStine/etoposide CHEMO IV infusion for Inpatient CI   Intravenous Once  . enoxaparin (LOVENOX) injection  40 mg Subcutaneous Q24H  . pantoprazole  40 mg Oral Daily  . potassium chloride SA  20 mEq Oral BID  . predniSONE  60 mg Oral QAC breakfast  . sodium chloride flush  10-40 mL Intracatheter Q12H  . vitamin B-12  1,000 mcg Oral QODAY   Continuous Infusions: . sodium chloride 20 mL/hr at 11/26/18 0135  . ondansetron (ZOFRAN) with dexamethasone (DECADRON) IV     PRN Meds:.Cold Pack, Hot Pack, lidocaine-prilocaine, polyethylene glycol, senna-docusate, sodium chloride flush  REVIEW OF SYSTEMS:    A 10+ POINT REVIEW OF SYSTEMS WAS OBTAINED including neurology, dermatology, psychiatry, cardiac, respiratory, lymph, extremities, GI, GU, Musculoskeletal, constitutional, breasts, reproductive, HEENT.  All pertinent positives are noted in the HPI.  All others are negative.   LABORATORY DATA:  I have reviewed the data as listed  . CBC Latest Ref Rng & Units 11/26/2018 11/25/2018 11/19/2018  WBC 4.0 - 10.5 K/uL 4.5 4.6 6.7  Hemoglobin 12.0 - 15.0 g/dL 7.6(L) 7.9(L) 8.2(L)  Hematocrit 36.0 - 46.0 % 25.0(L) 26.4(L) 27.1(L)  Platelets 150 - 400 K/uL 313 342 215    . CMP Latest Ref Rng & Units 11/26/2018 11/25/2018 11/19/2018  Glucose 70 - 99 mg/dL 130(H) 87 116(H)  BUN 8 - 23 mg/dL _0 Creatinine 0.44 - 1.00 mg/dL 0.84 0.91 1.01(H)  Sodium 135 - 145 mmol/L 140 139 142  Potassium 3.5 - 5.1 mmol/L 4.1 3.5 3.8  Chloride 98 - 111 mmol/L 113(H) 110 109  CO2 22 - 32 mmol/L _1 Calcium 8.9 - 10.3 mg/dL 8.6(L) 8.5(L) 8.8(L)  Total Protein 6.5 - 8.1 g/dL - 6.2(L) 6.4(L)  Total Bilirubin 0.3 - 1.2 mg/dL - 0.3 <0.2(L)  Alkaline Phos 38 - 126 U/L - 58 74  AST 15 - 41 U/L - 12(L) 11(L)  ALT 0 - 44 U/L - 11 10   Component     Latest Ref  Rng & Units 09/23/2018 09/24/2018  Magnesium     1.7 - 2.4 mg/dL 1.2 (L) 2.0  Phosphorus     2.5 - 4.6 mg/dL 2.6   Uric Acid, Serum     2.5 - 7.1 mg/dL 5.1   Vitamin D, 25-Hydroxy     30.0 - 100.0 ng/mL 32.6     RADIOGRAPHIC STUDIES:  No results found.  ASSESSMENT & PLAN:   78 y.o. female with  1.  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 2. Tiny hiatal hernia. 3. Pelvic floor laxity. Aortic Atherosclerosis.  Labs upon initial presentation from1/14/20, WBC normal at 4.3k, HGB at 10.6, PLT normal at 201k  08/06/18 Right Inguinal LN Biopsy which revealed Large B-Cell Lymphoma with a Ki67 of 90%. FISH is negative for rearrangements of BCL2, BCL6, and MYC. 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  08/13/18 Hep B and Hep C negative  08/21/18 ECHO which revealed LV EF of 50-55%  08/22/18 PET/CT revealedProgression, since 83/38/2505, of hypermetabolic abdominopelvic adenopathy, consistent with active lymphoma. 2. CT occult hypermetabolic osseous foci, also most consistent with active lymphoma. 3. No evidence of soft tissue disease above the diaphragm. 4. Focus of hypermetabolism within the anus is likely physiologic. Consider physical exam correlation. 5. Aortic Atherosclerosis.  3. RLE swelling r/o DVT. Could be from  lymphedema related to her new/recurrent NHL 1/21/10US Venous RLE to rule out DVT - reviewed - no VTE  4. Hypokalemia resolved K 3.3 on admission  5. Pancytopenia due to chemotherapy and DLBCL  6. 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  No evidence of bleeding hgb stable.  7. DVT Px - lovenox [place]   8.  Hypocalcemia improved from 5.7 to 8.1 to 8.4  9.  Hypomagnesemia resolved with replacement and up from 1.2 to 2  PLAN: -Discussed pt labwork today, 11/26/2018 HGB stable at 7.6, other blood counts stable.   -The pt has no prohibitive toxicities from continuing C5D2 dose reduced EPOCH-R at this time. Dose reduced for age and previous chemotherapy exposure. -Continue eating well, staying hydrated, and staying as active as reasonably possible  -Continue KDur 20 MEq twice a day. -lasix held per patient request. -Check vitamin D levels and replace aggressively to maintain 25-hydroxy vitamin D levels more than 60. -Emphasized the importance of eating well and increasing movement throughout treatments.   -Discussed that the patient could use a humidifier, saline spray to use to the nasal areas.  -Will continue watching HGB and will support with PRBC transfusion if necessary for hgb<8.  No transfusion has been ordered today due to shortage and policy to transfuse only if hemoglobin is less than 7.0 actively bleeding. -Monitor electrolytes closely -Miralax and Senna S for vincristine associated constipation with previous treatment.   -Outpatient neulasta and Rituxan on 12/02/2018 -Will see the pt in clinic on 12/09/2018   The total time spent in the appt was 25 minutes and more than 50% was on counseling and direct patient cares.    Mikey Bussing, DNP, AGPCNP-BC, AOCNP   11/26/2018 8:22 AM    ADDENDUM  .Patient was Personally and independently interviewed, examined and relevant elements of the history of present illness were reviewed in details and an assessment and plan  was created. All elements of the patient's history of present illness , assessment and plan were discussed in details with Mikey Bussing, DNP . The above documentation reflects our combined findings assessment  and plan.  Sullivan Lone MD MS

## 2018-11-27 DIAGNOSIS — D649 Anemia, unspecified: Secondary | ICD-10-CM

## 2018-11-27 DIAGNOSIS — C8338 Diffuse large B-cell lymphoma, lymph nodes of multiple sites: Secondary | ICD-10-CM

## 2018-11-27 LAB — CBC
HCT: 23.5 % — ABNORMAL LOW (ref 36.0–46.0)
Hemoglobin: 7.3 g/dL — ABNORMAL LOW (ref 12.0–15.0)
MCH: 29.2 pg (ref 26.0–34.0)
MCHC: 31.1 g/dL (ref 30.0–36.0)
MCV: 94 fL (ref 80.0–100.0)
Platelets: 313 10*3/uL (ref 150–400)
RBC: 2.5 MIL/uL — ABNORMAL LOW (ref 3.87–5.11)
RDW: 16.9 % — ABNORMAL HIGH (ref 11.5–15.5)
WBC: 6 10*3/uL (ref 4.0–10.5)
nRBC: 0 % (ref 0.0–0.2)

## 2018-11-27 LAB — BASIC METABOLIC PANEL
Anion gap: 6 (ref 5–15)
BUN: 22 mg/dL (ref 8–23)
CO2: 22 mmol/L (ref 22–32)
Calcium: 8.6 mg/dL — ABNORMAL LOW (ref 8.9–10.3)
Chloride: 115 mmol/L — ABNORMAL HIGH (ref 98–111)
Creatinine, Ser: 0.82 mg/dL (ref 0.44–1.00)
GFR calc Af Amer: >60 mL/min (ref >60)
GFR calc non Af Amer: >60 mL/min (ref >60)
Glucose, Bld: 123 mg/dL — ABNORMAL HIGH (ref 70–99)
Potassium: 3.9 mmol/L (ref 3.5–5.1)
Sodium: 143 mmol/L (ref 135–145)

## 2018-11-27 MED ORDER — ALUM & MAG HYDROXIDE-SIMETH 200-200-20 MG/5ML PO SUSP: 30.0000 mL | Freq: Four times a day (QID) | ORAL | Status: DC | PRN

## 2018-11-27 MED ORDER — SODIUM CHLORIDE 0.9 % IV SOLN
Freq: Once | INTRAVENOUS | Status: AC
  Administered 2018-11-27: 10:00:00 18 mg via INTRAVENOUS
  Filled 2018-11-27: qty 4

## 2018-11-27 MED ORDER — VINCRISTINE SULFATE CHEMO INJECTION 1 MG/ML
Freq: Once | INTRAVENOUS | Status: AC
  Administered 2018-11-27: 11:00:00 via INTRAVENOUS
  Filled 2018-11-27: qty 6

## 2018-11-27 NOTE — Progress Notes (Addendum)
HEMATOLOGY/ONCOLOGY INPATIENT PROGRESS NOTE  Date of Service: 11/27/2018  Inpatient Attending: .Brunetta Genera, MD   SUBJECTIVE:   [place] is sitting up in bed this morning. The pt reports that she is doing well overall.   The pt reports that she does not have excessive fatigue. She notes that she is eating well and has been ambulating periodically. Bowels moving.  Reported reflux symptoms last evening which have now resolved.  Lab results today (11/27/2018) of CBC w/diff and CMP is as follows: all values are WNL except for RBC at 2.50, HGB at 7.3, HCT at 23.5, RDW 16.9, chloride at 115, Glucose at 123, calcium 8.6.  On review of systems, pt reports stable energy levels and denies leg swelling, abdominal pain, nausea and vomiting, and any other symptoms.     OBJECTIVE:  NAD  PHYSICAL EXAMINATION: . Vitals:   11/26/18 0439 11/26/18 1400 11/26/18 2004 11/27/18 0544  BP: 130/74 (!) 149/77 (!) 143/83 130/75  Pulse: 81 89 95 81  Resp: '17 14 18 18  ' Temp: 97.9 F (36.6 C) 97.9 F (36.6 C) 97.6 F (36.4 C) 97.7 F (36.5 C)  TempSrc:  Oral Oral Oral  SpO2: 100% 100% 100% 98%   BP 130/75 (BP Location: Left Arm)   Pulse 81   Temp 97.7 F (36.5 C) (Oral)   Resp 18   LMP  (LMP Unknown)   SpO2 98%   GENERAL:alert, in no acute distress and comfortable SKIN: no acute rashes, no significant lesions EYES: conjunctiva are pink and non-injected, sclera anicteric OROPHARYNX: MMM, no exudate, no oropharyngeal erythema or ulceration NECK: supple, no JVD LYMPH:  no palpable lymphadenopathy in the cervical, axillary or inguinal regions LUNGS: clear to auscultation b/l with normal respiratory effort HEART: regular rate & rhythm ABDOMEN:  normoactive bowel sounds , non tender, not distended. No palpable hepatosplenomegaly.  Extremity: no pedal edema PSYCH: alert & oriented x 3 with fluent speech NEURO: no focal motor/sensory deficits    MEDICAL HISTORY:  Past Medical  History:  Diagnosis Date  . GERD (gastroesophageal reflux disease)   . HH (hiatus hernia) 12/12/2011  . Hypertension   . Leukopenia 12/12/2011  . nhl dx'd 2010  . Seizures ([place]) reports "years ago"  . Syncope 12/12/2011   Hx seizures - not documented - age 51; recent syncope 6/12 and x2, 12/12; negative MRI brain 1/13    SURGICAL HISTORY: Past Surgical History:  Procedure Laterality Date  . COLON SURGERY    . IR IMAGING GUIDED PORT INSERTION  08/23/2018    SOCIAL HISTORY: Social History   Socioeconomic History  . Marital status: Married    Spouse name: Not on file  . Number of children: Not on file  . Years of education: Not on file  . Highest education level: Not on file  Occupational History  . Not on file  Social Needs  . Financial resource strain: Not on file  . Food insecurity:    Worry: Not on file    Inability: Not on file  . Transportation needs:    Medical: Not on file    Non-medical: Not on file  Tobacco Use  . Smoking status: Never Smoker  . Smokeless tobacco: Never Used  Substance and Sexual Activity  . Alcohol use: No  . Drug use: No  . Sexual activity: Not on file  Lifestyle  . Physical activity:    Days per week: Not on file    Minutes per session: Not on file  .  Stress: Not on file  Relationships  . Social connections:    Talks on phone: Not on file    Gets together: Not on file    Attends religious service: Not on file    Active member of club or organization: Not on file    Attends meetings of clubs or organizations: Not on file    Relationship status: Not on file  . Intimate partner violence:    Fear of current or ex partner: Not on file    Emotionally abused: Not on file    Physically abused: Not on file    Forced sexual activity: Not on file  Other Topics Concern  . Not on file  Social History Narrative  . Not on file    FAMILY HISTORY: History reviewed. No pertinent family history.  ALLERGIES:  has No Known Allergies.   MEDICATIONS:  Scheduled Meds: . amLODipine  10 mg Oral QODAY  . cholecalciferol  1,000 Units Oral Daily  . DOXOrubicin/vinCRIStine/etoposide CHEMO IV infusion for Inpatient CI   Intravenous Once  . enoxaparin (LOVENOX) injection  40 mg Subcutaneous Q24H  . pantoprazole  40 mg Oral Daily  . potassium chloride SA  20 mEq Oral BID  . predniSONE  60 mg Oral QAC breakfast  . sodium chloride flush  10-40 mL Intracatheter Q12H  . vitamin B-12  1,000 mcg Oral QODAY   Continuous Infusions: . sodium chloride 10 mL/hr at 11/26/18 1253   PRN Meds:.Cold Pack, Hot Pack, lidocaine-prilocaine, polyethylene glycol, senna-docusate, sodium chloride flush  REVIEW OF SYSTEMS:    A 10+ POINT REVIEW OF SYSTEMS WAS OBTAINED including neurology, dermatology, psychiatry, cardiac, respiratory, lymph, extremities, GI, GU, Musculoskeletal, constitutional, breasts, reproductive, HEENT.  All pertinent positives are noted in the HPI.  All others are negative.   LABORATORY DATA:  I have reviewed the data as listed  . CBC Latest Ref Rng & Units 11/27/2018 11/26/2018 11/25/2018  WBC 4.0 - 10.5 K/uL 6.0 4.5 4.6  Hemoglobin 12.0 - 15.0 g/dL 7.3(L) 7.6(L) 7.9(L)  Hematocrit 36.0 - 46.0 % 23.5(L) 25.0(L) 26.4(L)  Platelets 150 - 400 K/uL 313 313 342    . CMP Latest Ref Rng & Units 11/27/2018 11/26/2018 11/25/2018  Glucose 70 - 99 mg/dL 123(H) 130(H) 87  BUN 8 - 23 mg/dL '22 22 20  ' Creatinine 0.44 - 1.00 mg/dL 0.82 0.84 0.91  Sodium 135 - 145 mmol/L 143 140 139  Potassium 3.5 - 5.1 mmol/L 3.9 4.1 3.5  Chloride 98 - 111 mmol/L 115(H) 113(H) 110  CO2 22 - 32 mmol/L '22 23 22  ' Calcium 8.9 - 10.3 mg/dL 8.6(L) 8.6(L) 8.5(L)  Total Protein 6.5 - 8.1 g/dL - - 6.2(L)  Total Bilirubin 0.3 - 1.2 mg/dL - - 0.3  Alkaline Phos 38 - 126 U/L - - 58  AST 15 - 41 U/L - - 12(L)  ALT 0 - 44 U/L - - 11   Component     Latest Ref Rng & Units 09/23/2018 09/24/2018  Magnesium     1.7 - 2.4 mg/dL 1.2 (L) 2.0  Phosphorus     2.5 - 4.6 mg/dL 2.6    Uric Acid, Serum     2.5 - 7.1 mg/dL 5.1   Vitamin D, 25-Hydroxy     30.0 - 100.0 ng/mL 32.6     RADIOGRAPHIC STUDIES:  No results found.  ASSESSMENT & PLAN:   78 y.o. female with  1. History of High Grade B-Cell Non-Hodgkin's Lymphoma Presented with extranodal involvement in the  terminal ileum s/p gross tumor resection in November 2010 06/01/09 Small bowel surgical pathology report indicated a High grade NH B-cell lymphoma, with an admixed smaller lymphocytes raise the possibility of a background pre-existing mucosa associated lymphoid tissue lymphoma (MALT) Treated with 4 cycles of R-CHOP between 07/05/09 and 09/06/09  2. 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 2. Tiny hiatal hernia. 3. Pelvic floor laxity. Aortic Atherosclerosis.  Labs upon initial presentation from1/14/20, WBC normal at 4.3k, HGB at 10.6, PLT normal at 201k  08/06/18 Right Inguinal LN Biopsy which revealed Large B-Cell Lymphoma with a Ki67 of 90%. FISH is negative for rearrangements of BCL2, BCL6, and MYC. 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  08/13/18 Hep B and Hep C negative  08/21/18 ECHO which revealed LV EF of 50-55%  08/22/18 PET/CT revealedProgression, since 75/64/3329, of hypermetabolic abdominopelvic adenopathy, consistent with active lymphoma. 2. CT occult hypermetabolic osseous foci, also most consistent with active lymphoma. 3. No evidence of soft tissue disease above the diaphragm. 4. Focus of hypermetabolism within the anus is likely physiologic. Consider physical exam correlation. 5. Aortic Atherosclerosis.  3. RLE swelling r/o DVT. Could be from lymphedema related to her new/recurrent NHL 1/21/10US Venous RLE to rule out DVT - reviewed - no VTE  4.  Hypokalemia resolved K 3.3 on admission  5. Pancytopenia due to chemotherapy and DLBCL  6. 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  No evidence of bleeding hgb stable.  7. DVT Px - lovenox [place]   8.  Hypocalcemia improved from 5.7 to 8.1 to 8.4  9.  Hypomagnesemia resolved with replacement and up from 1.2 to 2  PLAN: -Discussed pt labwork today, 11/27/2018 HGB has drifted down to 7.3, other blood counts stable.  She has no active bleeding.  We will add type and screen in the morning.  I have discussed with the patient that she may need a blood transfusion prior to discharge. -The pt has no prohibitive toxicities from continuing C5D3 dose reduced EPOCH-R at this time. Dose reduced for age and previous chemotherapy exposure. -Continue eating well, staying hydrated, and staying as active as reasonably possible  -Continue KDur 20 MEq twice a day. -lasix held per patient request. -Check vitamin D levels and replace aggressively to maintain 25-hydroxy vitamin D levels more than 60. -Emphasized the importance of eating well and increasing movement throughout treatments.   -Discussed that the patient could use a humidifier, saline spray to use to the nasal areas.  -Monitor electrolytes closely -Miralax and Senna S for vincristine associated constipation with previous treatment.   -Added Maalox as needed for reflux symptoms.  Continue Protonix. -Outpatient neulasta and Rituxan on 12/02/2018 -Will see the pt in clinic on 12/09/2018   The total time spent in the appt was 25 minutes and more than 50% was on counseling and direct patient cares.    Mikey Bussing, DNP, AGPCNP-BC, AOCNP   11/27/2018 7:49 AM    ADDENDUM  .Patient was Personally and independently interviewed, examined and relevant elements of the history of present illness were reviewed in details and an assessment and plan was created. All elements of the patient's history of present illness , assessment and plan were  discussed in details with Mikey Bussing, DNP. The above documentation reflects our combined findings assessment and plan.  Sullivan Lone MD MS

## 2018-11-27 NOTE — Progress Notes (Signed)
Verified w/ Lamont Snowball, NP - pt will get chemo as planned today. Kennith Center, Pharm.D., CPP 11/27/2018@8 :37 AM

## 2018-11-28 LAB — CBC
HCT: 28.7 % — ABNORMAL LOW (ref 36.0–46.0)
Hemoglobin: 8.5 g/dL — ABNORMAL LOW (ref 12.0–15.0)
MCH: 27.9 pg (ref 26.0–34.0)
MCHC: 29.6 g/dL — ABNORMAL LOW (ref 30.0–36.0)
MCV: 94.1 fL (ref 80.0–100.0)
Platelets: 388 10*3/uL (ref 150–400)
RBC: 3.05 MIL/uL — ABNORMAL LOW (ref 3.87–5.11)
RDW: 16.5 % — ABNORMAL HIGH (ref 11.5–15.5)
WBC: 3.7 10*3/uL — ABNORMAL LOW (ref 4.0–10.5)
nRBC: 0 % (ref 0.0–0.2)

## 2018-11-28 LAB — BASIC METABOLIC PANEL
Anion gap: 8 (ref 5–15)
BUN: 22 mg/dL (ref 8–23)
CO2: 21 mmol/L — ABNORMAL LOW (ref 22–32)
Calcium: 9 mg/dL (ref 8.9–10.3)
Chloride: 113 mmol/L — ABNORMAL HIGH (ref 98–111)
Creatinine, Ser: 0.85 mg/dL (ref 0.44–1.00)
GFR calc Af Amer: >60 mL/min (ref >60)
GFR calc non Af Amer: >60 mL/min (ref >60)
Glucose, Bld: 115 mg/dL — ABNORMAL HIGH (ref 70–99)
Potassium: 3.7 mmol/L (ref 3.5–5.1)
Sodium: 142 mmol/L (ref 135–145)

## 2018-11-28 MED ORDER — SODIUM CHLORIDE 0.9 % IV SOLN
400.0000 mg/m2 | Freq: Once | INTRAVENOUS | Status: AC
  Administered 2018-11-29: 10:00:00 780 mg via INTRAVENOUS
  Filled 2018-11-28: qty 39

## 2018-11-28 MED ORDER — VINCRISTINE SULFATE CHEMO INJECTION 1 MG/ML
Freq: Once | INTRAVENOUS | Status: AC
  Administered 2018-11-28: 11:00:00 via INTRAVENOUS
  Filled 2018-11-28: qty 6

## 2018-11-28 MED ORDER — SODIUM CHLORIDE 0.9 % IV SOLN
Freq: Once | INTRAVENOUS | Status: AC
  Administered 2018-11-29: 36 mg via INTRAVENOUS
  Filled 2018-11-28: qty 8

## 2018-11-28 MED ORDER — SODIUM CHLORIDE 0.9 % IV SOLN
Freq: Once | INTRAVENOUS | Status: AC
  Administered 2018-11-28: 8 mg via INTRAVENOUS
  Filled 2018-11-28: qty 4

## 2018-11-28 NOTE — Progress Notes (Signed)
11/28/18 labs reviewed with Mikey Bussing, NP. Ok to proceed with Day 4 treatment today. Continue same chemo doses.   Hardie Pulley, PharmD, BCPS, BCOP

## 2018-11-28 NOTE — Progress Notes (Signed)
Chemotherapy dosing based on patient's BSA and normal dosing for Doxorubicin, Etoposide and Vincristine independently checked with Clotilde Dieter, RN.

## 2018-11-28 NOTE — Progress Notes (Addendum)
HEMATOLOGY/ONCOLOGY INPATIENT PROGRESS NOTE  Date of Service: 11/28/2018  Inpatient Attending: .Brunetta Genera, MD   SUBJECTIVE:   Taylor Huynh is sitting up in bed this morning. The pt reports that she is doing well overall.   The pt reports that she does not have excessive fatigue. She notes that she is eating well and has been ambulating periodically. Bowels moving. No recurrent reflux symptoms.   Lab results today (11/28/2018) of CBC w/diff and CMP is as follows: all values are WNL except for WBC 3.7, RBC at 3.05, HGB at 8.5, HCT at 28.7, MCHC 29.6, RDW 16.5, chloride at 113, CO2 21, Glucose at 115.  On review of systems, pt reports stable energy levels and denies leg swelling, abdominal pain, nausea and vomiting, and any other symptoms.     OBJECTIVE:  NAD  PHYSICAL EXAMINATION: . Vitals:   11/27/18 0544 11/27/18 1318 11/27/18 1959 11/28/18 0425  BP: 130/75 (!) 153/80 (!) 155/98 (!) 160/64  Pulse: 81 73 76 62  Resp: '18  18 18  ' Temp: 97.7 F (36.5 C) 98.3 F (36.8 C) 98.4 F (36.9 C) 97.9 F (36.6 C)  TempSrc: Oral Oral Oral Oral  SpO2: 98% 99% 96% 99%   BP (!) 160/64 (BP Location: Left Arm)   Pulse 62   Temp 97.9 F (36.6 C) (Oral)   Resp 18   LMP  (LMP Unknown)   SpO2 99%   GENERAL:alert, in no acute distress and comfortable SKIN: no acute rashes, no significant lesions EYES: conjunctiva are pink and non-injected, sclera anicteric OROPHARYNX: MMM, no exudate, no oropharyngeal erythema or ulceration NECK: supple, no JVD LYMPH:  no palpable lymphadenopathy in the cervical, axillary or inguinal regions LUNGS: clear to auscultation b/l with normal respiratory effort HEART: regular rate & rhythm ABDOMEN:  normoactive bowel sounds , non tender, not distended. No palpable hepatosplenomegaly.  Extremity: no pedal edema PSYCH: alert & oriented x 3 with fluent speech NEURO: no focal motor/sensory deficits    MEDICAL HISTORY:  Past Medical History:   Diagnosis Date  . GERD (gastroesophageal reflux disease)   . HH (hiatus hernia) 12/12/2011  . Hypertension   . Leukopenia 12/12/2011  . nhl dx'd 2010  . Seizures ([place]) reports "years ago"  . Syncope 12/12/2011   Hx seizures - not documented - age 67; recent syncope 6/12 and x2, 12/12; negative MRI brain 1/13    SURGICAL HISTORY: Past Surgical History:  Procedure Laterality Date  . COLON SURGERY    . IR IMAGING GUIDED PORT INSERTION  08/23/2018    SOCIAL HISTORY: Social History   Socioeconomic History  . Marital status: Married    Spouse name: Not on file  . Number of children: Not on file  . Years of education: Not on file  . Highest education level: Not on file  Occupational History  . Not on file  Social Needs  . Financial resource strain: Not on file  . Food insecurity:    Worry: Not on file    Inability: Not on file  . Transportation needs:    Medical: Not on file    Non-medical: Not on file  Tobacco Use  . Smoking status: Never Smoker  . Smokeless tobacco: Never Used  Substance and Sexual Activity  . Alcohol use: No  . Drug use: No  . Sexual activity: Not on file  Lifestyle  . Physical activity:    Days per week: Not on file    Minutes per session: Not on  file  . Stress: Not on file  Relationships  . Social connections:    Talks on phone: Not on file    Gets together: Not on file    Attends religious service: Not on file    Active member of club or organization: Not on file    Attends meetings of clubs or organizations: Not on file    Relationship status: Not on file  . Intimate partner violence:    Fear of current or ex partner: Not on file    Emotionally abused: Not on file    Physically abused: Not on file    Forced sexual activity: Not on file  Other Topics Concern  . Not on file  Social History Narrative  . Not on file    FAMILY HISTORY: History reviewed. No pertinent family history.  ALLERGIES:  has No Known Allergies.  MEDICATIONS:   Scheduled Meds: . amLODipine  10 mg Oral QODAY  . cholecalciferol  1,000 Units Oral Daily  . enoxaparin (LOVENOX) injection  40 mg Subcutaneous Q24H  . pantoprazole  40 mg Oral Daily  . potassium chloride SA  20 mEq Oral BID  . predniSONE  60 mg Oral QAC breakfast  . sodium chloride flush  10-40 mL Intracatheter Q12H  . vitamin B-12  1,000 mcg Oral QODAY   Continuous Infusions: . sodium chloride 10 mL/hr at 11/27/18 1102   PRN Meds:.alum & mag hydroxide-simeth, Cold Pack, Hot Pack, lidocaine-prilocaine, polyethylene glycol, senna-docusate, sodium chloride flush  REVIEW OF SYSTEMS:    A 10+ POINT REVIEW OF SYSTEMS WAS OBTAINED including neurology, dermatology, psychiatry, cardiac, respiratory, lymph, extremities, GI, GU, Musculoskeletal, constitutional, breasts, reproductive, HEENT.  All pertinent positives are noted in the HPI.  All others are negative.   LABORATORY DATA:  I have reviewed the data as listed  . CBC Latest Ref Rng & Units 11/28/2018 11/27/2018 11/26/2018  WBC 4.0 - 10.5 K/uL 3.7(L) 6.0 4.5  Hemoglobin 12.0 - 15.0 g/dL 8.5(L) 7.3(L) 7.6(L)  Hematocrit 36.0 - 46.0 % 28.7(L) 23.5(L) 25.0(L)  Platelets 150 - 400 K/uL 388 313 313    . CMP Latest Ref Rng & Units 11/28/2018 11/27/2018 11/26/2018  Glucose 70 - 99 mg/dL 115(H) 123(H) 130(H)  BUN 8 - 23 mg/dL '22 22 22  ' Creatinine 0.44 - 1.00 mg/dL 0.85 0.82 0.84  Sodium 135 - 145 mmol/L 142 143 140  Potassium 3.5 - 5.1 mmol/L 3.7 3.9 4.1  Chloride 98 - 111 mmol/L 113(H) 115(H) 113(H)  CO2 22 - 32 mmol/L 21(L) 22 23  Calcium 8.9 - 10.3 mg/dL 9.0 8.6(L) 8.6(L)  Total Protein 6.5 - 8.1 g/dL - - -  Total Bilirubin 0.3 - 1.2 mg/dL - - -  Alkaline Phos 38 - 126 U/L - - -  AST 15 - 41 U/L - - -  ALT 0 - 44 U/L - - -   Component     Latest Ref Rng & Units 09/23/2018 09/24/2018  Magnesium     1.7 - 2.4 mg/dL 1.2 (L) 2.0  Phosphorus     2.5 - 4.6 mg/dL 2.6   Uric Acid, Serum     2.5 - 7.1 mg/dL 5.1   Vitamin D, 25-Hydroxy     30.0  - 100.0 ng/mL 32.6     RADIOGRAPHIC STUDIES:  No results found.  ASSESSMENT & PLAN:   78 y.o. female with  1. 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 2. Tiny hiatal hernia. 3. Pelvic floor laxity. Aortic Atherosclerosis.  Labs upon initial presentation from1/14/20, WBC normal at 4.3k, HGB at 10.6, PLT normal at 201k  08/06/18 Right Inguinal LN Biopsy which revealed Large B-Cell Lymphoma with a Ki67 of 90%. FISH is negative for rearrangements of BCL2, BCL6, and MYC. 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  08/13/18 Hep B and Hep C negative  08/21/18 ECHO which revealed LV EF of 50-55%  08/22/18 PET/CT revealedProgression, since 15/94/5859, of hypermetabolic abdominopelvic adenopathy, consistent with active lymphoma. 2. CT occult hypermetabolic osseous foci, also most consistent with active lymphoma. 3. No evidence of soft tissue disease above the diaphragm. 4. Focus of hypermetabolism within the anus is likely physiologic. Consider physical exam correlation. 5. Aortic Atherosclerosis.  3. RLE swelling r/o DVT. Could be from lymphedema related to her new/recurrent NHL 1/21/10US Venous RLE to rule out DVT - reviewed - no VTE  4. Hypokalemia resolved K 3.3 on admission  5. Pancytopenia due to chemotherapy and  DLBCL  6. 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  No evidence of bleeding hgb stable.  7. DVT Px - lovenox [place]   8.  Hypocalcemia improved from 5.7 to 8.1 to 8.4  9.  Hypomagnesemia resolved with replacement and up from 1.2 to 2  PLAN: -Discussed pt labwork today, 11/28/2018 HGB 8.5, other blood counts stable. She has no active bleeding. No transfusion indicated.  -The pt has no prohibitive toxicities from continuing C5D4 dose reduced EPOCH-R at this time. Dose reduced for age and previous chemotherapy exposure. -Continue eating well, staying hydrated, and staying as active as reasonably possible  -Continue KDur 20 MEq twice a day. -lasix held per patient request. -Check vitamin D levels and replace aggressively to maintain 25-hydroxy vitamin D levels more than 60. -Emphasized the importance of eating well and increasing movement throughout treatments.   -Discussed that the patient could use a humidifier, saline spray to use to the nasal areas.  -Will continue watching HGB and will support with PRBC transfusion if necessary for hgb<8.  No transfusion has been ordered today due to shortage and policy to transfuse only if hemoglobin is less than 7.0 or actively bleeding or symptomatic. -Monitor electrolytes closely -Miralax and Senna S for vincristine associated constipation with previous treatment.   -Continue Protonix. Has Maalox PRN. -Outpatient neulasta and Rituxan on 12/02/2018 -Will see the pt in clinic on 12/09/2018   The total time spent in the appt was 25 minutes and more than 50% was on counseling and direct patient cares.    Mikey Bussing, DNP, AGPCNP-BC, AOCNP   11/28/2018 9:02 AM    ADDENDUM  .Patient was Personally and independently interviewed, examined and relevant elements of the history of present illness were reviewed in details and an assessment and plan was created. All elements of the patient's history of present illness , assessment and plan were  discussed in details with Mikey Bussing, DNP. The above documentation reflects our combined findings assessment and plan.  Sullivan Lone MD MS

## 2018-11-28 NOTE — Care Management Important Message (Signed)
Important Message  Patient Details IM Letter given to Servando Snare SW to present to the Patient Name: Taylor Huynh MRN: [id number] Date of Birth: 12/21/1940   Medicare Important Message Given:  Yes    [name] 11/28/2018, 10:30 AMImportant Message  Patient Details  Name: Taylor Huynh MRN: [id number] Date of Birth: 22-Jul-1941   Medicare Important Message Given:  Yes    [name] 11/28/2018, 10:30 AM

## 2018-11-29 LAB — CBC
HCT: 23.6 % — ABNORMAL LOW (ref 36.0–46.0)
Hemoglobin: 7.2 g/dL — ABNORMAL LOW (ref 12.0–15.0)
MCH: 27.9 pg (ref 26.0–34.0)
MCHC: 30.5 g/dL (ref 30.0–36.0)
MCV: 91.5 fL (ref 80.0–100.0)
Platelets: 297 10*3/uL (ref 150–400)
RBC: 2.58 MIL/uL — ABNORMAL LOW (ref 3.87–5.11)
RDW: 15.9 % — ABNORMAL HIGH (ref 11.5–15.5)
WBC: 2 10*3/uL — ABNORMAL LOW (ref 4.0–10.5)
nRBC: 0 % (ref 0.0–0.2)

## 2018-11-29 LAB — BASIC METABOLIC PANEL
Anion gap: 6 (ref 5–15)
BUN: 23 mg/dL (ref 8–23)
CO2: 23 mmol/L (ref 22–32)
Calcium: 8.9 mg/dL (ref 8.9–10.3)
Chloride: 111 mmol/L (ref 98–111)
Creatinine, Ser: 0.71 mg/dL (ref 0.44–1.00)
GFR calc Af Amer: >60 mL/min (ref >60)
GFR calc non Af Amer: >60 mL/min (ref >60)
Glucose, Bld: 103 mg/dL — ABNORMAL HIGH (ref 70–99)
Potassium: 3.4 mmol/L — ABNORMAL LOW (ref 3.5–5.1)
Sodium: 140 mmol/L (ref 135–145)

## 2018-11-29 LAB — PREPARE RBC (CROSSMATCH)

## 2018-11-29 MED ORDER — SODIUM CHLORIDE 0.9% IV SOLUTION
Freq: Once | INTRAVENOUS | Status: AC
  Administered 2018-11-29: 11:00:00 via INTRAVENOUS

## 2018-11-29 MED ORDER — ACETAMINOPHEN 325 MG PO TABS
650.0000 mg | ORAL_TABLET | Freq: Once | ORAL | Status: AC
  Administered 2018-11-29: 11:00:00 650 mg via ORAL
  Filled 2018-11-29: qty 2

## 2018-11-29 MED ORDER — DIPHENHYDRAMINE HCL 25 MG PO CAPS
25.0000 mg | ORAL_CAPSULE | Freq: Once | ORAL | Status: AC
  Administered 2018-11-29: 11:00:00 25 mg via ORAL
  Filled 2018-11-29: qty 1

## 2018-11-29 MED ORDER — HEPARIN SOD (PORK) LOCK FLUSH 100 UNIT/ML IV SOLN
500.0000 [IU] | Freq: Once | INTRAVENOUS | Status: AC
  Administered 2018-11-29: 14:00:00 500 [IU] via INTRAVENOUS
  Filled 2018-11-29: qty 5

## 2018-11-29 NOTE — TOC Transition Note (Signed)
Transition of Care [facility]) - CM/SW Discharge Note   Patient Details  Name: Taylor Huynh MRN: [id number] Date of Birth: 15-Feb-1941  Transition of Care [facility]) CM/SW Contact:  [name], RN Phone Number: 11/29/2018, 10:18 AM   Clinical Narrative:    Discharged to home with self-care, orders checked for hhc needs. No TOC needs present at time of discharge.  Patient is able to arrangement own appointments and home care.   Final next level of care: Home/Self Care Barriers to Discharge: No Barriers Identified   Patient Goals and CMS Choice Patient states their goals for this hospitalization and ongoing recovery are:: just to go home and stay there CMS Medicare.gov Compare Post Acute Care list provided to:: Patient    Discharge Placement                       Discharge Plan and Services                                     Social Determinants of Health (SDOH) Interventions     Readmission Risk Interventions No flowsheet data found.

## 2018-11-29 NOTE — Discharge Summary (Addendum)
Physician Discharge Summary  Patient ID: Taylor Huynh MRN: [id number] DOB/AGE: September 01, 1940 78 y.o.  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 Diffuse large B cell lymphoma ([place])  Allergies as of 11/29/2018   No Known Allergies     Medication List    TAKE these medications   amLODipine 10 MG tablet Commonly known as:  NORVASC Take 10 mg by mouth every other day.   cholecalciferol 1000 units tablet Commonly known as:  VITAMIN D Take 1,000 Units by mouth daily.   lidocaine-prilocaine cream Commonly known as:  EMLA Apply 1 application topically as needed. What changed:  reasons to take this   Melatonin 5 MG Tabs Take 5 mg by mouth at bedtime.   omeprazole 20 MG capsule Commonly known as:  PRILOSEC Take 20 mg by mouth daily.   polyethylene glycol 17 g packet Commonly known as:  MIRALAX / GLYCOLAX Take 17 g by mouth daily as needed.   potassium chloride SA 20 MEQ tablet Commonly known as:  K-DUR Take 1 tablet (20 mEq total) by mouth 2 (two) times daily.   senna-docusate 8.6-50 MG tablet Commonly known as:  Senokot-S Take 2 tablets by mouth at bedtime as needed for mild constipation.   vitamin B-12 1000 MCG tablet Commonly known as:  CYANOCOBALAMIN Take 1,000 mcg by mouth every other day.        Disposition:  -Outpatient Neulasta and Rituxan on 12/02/2018 -Follow-up with Dr. Irene Limbo on 12/09/2018 for toxicity and nadir check.  Discharge laboratory values: CBC    Component Value Date/Time   WBC 2.0 (L) 11/29/2018 0543   RBC 2.58 (L) 11/29/2018 0543   HGB 7.2 (L) 11/29/2018 0543   HGB 9.4 (L) 09/09/2018 0803   HGB 12.1 09/22/2013 1222   HCT 23.6 (L) 11/29/2018 0543   HCT 37.3 09/22/2013 1222   PLT 297 11/29/2018 0543   PLT 101 (L) 09/09/2018 0803   PLT 248 09/22/2013 1222   MCV 91.5  11/29/2018 0543   MCV 88.0 09/22/2013 1222   MCH 27.9 11/29/2018 0543   MCHC 30.5 11/29/2018 0543   RDW 15.9 (H) 11/29/2018 0543   RDW 13.6 09/22/2013 1222   LYMPHSABS 0.7 11/25/2018 0950   LYMPHSABS 1.4 09/22/2013 1222   MONOABS 0.6 11/25/2018 0950   MONOABS 0.3 09/22/2013 1222   EOSABS 0.1 11/25/2018 0950   EOSABS 0.1 09/22/2013 1222   BASOSABS 0.0 11/25/2018 0950   BASOSABS 0.0 09/22/2013 1222   CMP Latest Ref Rng & Units 11/29/2018 11/28/2018 11/27/2018  Glucose 70 - 99 mg/dL 103(H) 115(H) 123(H)  BUN 8 - 23 mg/dL _0 Creatinine 0.44 - 1.00 mg/dL 0.71 0.85 0.82  Sodium 135 - 145 mmol/L 140 142 143  Potassium 3.5 - 5.1 mmol/L 3.4(L) 3.7 3.9  Chloride 98 - 111 mmol/L 111 113(H) 115(H)  CO2 22 - 32 mmol/L 23 21(L) 22  Calcium 8.9 - 10.3 mg/dL 8.9 9.0 8.6(L)  Total Protein 6.5 - 8.1 g/dL - - -  Total Bilirubin 0.3 - 1.2 mg/dL - - -  Alkaline Phos 38 - 126 U/L - - -  AST 15 - 41 U/L - - -  ALT 0 - 44 U/L - - -   Brief H&P: Taylor Huynh is a wonderful 78 y.o. female who has been admitted today for C5 dose reduced EPOCH-R treatment of  her Diffuse Large B-Cell Lymphoma. The pt reports that she is doing well overall.   The pt reports that she has not developed any new concerns in the interim, nor concerns for infections. She denies leg swelling, fevers, chills or night sweats.  Lab results today (11/25/18) are WNL except for RBC 2.81, HGB 7.9, HCT 26.4, MCHC 29.9, RDW 16.9, Calcium 8.5, Total Protein 6.2, AST 12  On review of systems, the patient reports stable energy levels.  She denies fevers, chills, night sweats, lower extremity edema, concerns for infections, abdominal pain, nausea, vomiting, or any other symptoms.  Issues during hospitalization 1. 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 Newly Diagnosed Recurrent/New High Grade B-Cell Non-Hodgkin's Lymphoma, Stage IV FISH negative for findings of double hit lymphoma  07/18/18 CT A/P revealed Extensive abdominal-pelvic adenopathy, consistent with recurrent lymphoma. 2. Tiny hiatal hernia. 3. Pelvic floor laxity. Aortic Atherosclerosis.  Labs upon initial presentation from1/14/20, WBC normal at 4.3k, HGB at 10.6, PLT normal at 201k  08/06/18 Right Inguinal LN Biopsy which revealed Large B-Cell Lymphoma with a Ki67 of 90%. FISH is negative for rearrangements of BCL2, BCL6, and MYC. 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  08/13/18 Hep B and Hep C negative  08/21/18 ECHO which revealed LV EF of 50-55%  08/22/18 PET/CT revealedProgression, since 09/32/3557, of hypermetabolic abdominopelvic adenopathy, consistent with active lymphoma. 2. CT occult hypermetabolic osseous foci, also most consistent with active lymphoma. 3. No evidence of soft tissue disease above the diaphragm. 4. Focus of hypermetabolism within the anus is likely physiologic. Consider physical exam correlation. 5. Aortic Atherosclerosis.  3. RLE swelling r/o DVT. Could be from lymphedema related to her new/recurrent NHL 1/21/10US Venous RLE to rule out DVT - reviewed - no VTE  4. Hypokalemia. K+ 3.4 on admission   5. Pancytopenia due to chemotherapy and DLBCL  6. 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 No evidence of bleeding  7. DVT Px - lovenox [place]  PLAN: -Discussed pt labwork today,11/29/2018, hemoglobin has dropped to 7.2 and she is starting to become symptomatic with fatigue and mild dyspnea on exertion.  Total white blood cell count slightly low at 2.0, potassium is low at 3.4. -The pt has no  prohibitive toxicities from continuingC5D5 dose reduced EPOCH-Rat this time. Dose reduced for age and previous chemotherapy exposure -Continue eating well, staying hydrated, and staying as active as reasonably possible -Hemoglobin is 7.2 and she is mildly symptomatic with increased fatigue and mild dyspnea on exertion.  Since she received chemotherapy this week we anticipate that her hemoglobin will continue to decline.  I recommend transfusion of 1 unit of packed red blood cells today prior to discharge.  She is agreeable to this.  Risks and benefits of a blood transfusion have been discussed with the patient.  -Miralax and Senokot as needed as an outpatient. -Encouraged patient to optimize po intake -Outpatient Rituxan and Neulasta on  12/02/2018 -Will see pt back in clinic on 12/09/2018  BP (!) 154/69 (BP Location: Left Arm)   Pulse 64   Temp 98.2 F (36.8 C) (Oral)   Resp 17   LMP  (LMP Unknown)  SpO2 98%   GENERAL:alert, in no acute distress and comfortable SKIN: no acute rashes, no significant lesions EYES: conjunctiva are pink and non-injected, sclera anicteric OROPHARYNX: MMM, no exudates, no oropharyngeal erythema or ulceration NECK: supple, no JVD LYMPH:  no palpable lymphadenopathy in the cervical, axillary or inguinal regions LUNGS: clear to auscultation b/l with normal respiratory effort HEART: regular rate & rhythm ABDOMEN:  normoactive bowel sounds , non tender, not distended. Extremity: no pedal edema PSYCH: alert & oriented x 3 with fluent speech NEURO: no focal motor/sensory deficits  Hospital Course: Active Problems:  Diffuse Large B cell lymphoma ([place]) Encounter for antineoplastic chemotherapy  Diet: Regular diet  Activity: Infection precautions as directed  Condition at discharge: Stable  Signed: Mikey Bussing 11/29/2018, 9:16 AM    ADDENDUM   .Patient was Personally and independently interviewed, examined and relevant elements of the discharge  plan and orders were reviewed in details. All elements of the patient's history of present illness , assessment and plan were discussed in details with Mikey Bussing DNP. The above documentation reflects our combined findings assessment and plan.  followup appointments as scheduled    Sullivan Lone MD MS TT spent discharging patient>5mns

## 2018-11-30 LAB — BPAM RBC
Blood Product Expiration Date: 202005262359
ISSUE DATE / TIME: 202005081109
Unit Type and Rh: 6200

## 2018-11-30 LAB — TYPE AND SCREEN
ABO/RH(D): A POS
Antibody Screen: NEGATIVE
Unit division: 0

## 2018-12-02 ENCOUNTER — Inpatient Hospital Stay: Payer: Medicare Other | Attending: Hematology

## 2018-12-02 ENCOUNTER — Other Ambulatory Visit: Payer: Self-pay

## 2018-12-02 VITALS — BP 155/79 | HR 80 | Temp 98.5°F | Resp 18 | Wt 162.5 lb

## 2018-12-02 DIAGNOSIS — Z7901 Long term (current) use of anticoagulants: Secondary | ICD-10-CM | POA: Insufficient documentation

## 2018-12-02 DIAGNOSIS — Z7189 Other specified counseling: Secondary | ICD-10-CM

## 2018-12-02 DIAGNOSIS — Z5189 Encounter for other specified aftercare: Secondary | ICD-10-CM | POA: Diagnosis not present

## 2018-12-02 DIAGNOSIS — D61810 Antineoplastic chemotherapy induced pancytopenia: Secondary | ICD-10-CM | POA: Insufficient documentation

## 2018-12-02 DIAGNOSIS — Z86718 Personal history of other venous thrombosis and embolism: Secondary | ICD-10-CM | POA: Diagnosis not present

## 2018-12-02 DIAGNOSIS — Z5112 Encounter for antineoplastic immunotherapy: Secondary | ICD-10-CM | POA: Insufficient documentation

## 2018-12-02 DIAGNOSIS — Z79899 Other long term (current) drug therapy: Secondary | ICD-10-CM | POA: Insufficient documentation

## 2018-12-02 DIAGNOSIS — I10 Essential (primary) hypertension: Secondary | ICD-10-CM | POA: Insufficient documentation

## 2018-12-02 DIAGNOSIS — D6489 Other specified anemias: Secondary | ICD-10-CM | POA: Insufficient documentation

## 2018-12-02 DIAGNOSIS — E538 Deficiency of other specified B group vitamins: Secondary | ICD-10-CM | POA: Diagnosis not present

## 2018-12-02 DIAGNOSIS — C8338 Diffuse large B-cell lymphoma, lymph nodes of multiple sites: Secondary | ICD-10-CM | POA: Insufficient documentation

## 2018-12-02 DIAGNOSIS — T451X5D Adverse effect of antineoplastic and immunosuppressive drugs, subsequent encounter: Secondary | ICD-10-CM | POA: Insufficient documentation

## 2018-12-02 MED ORDER — SODIUM CHLORIDE 0.9 % IV SOLN
375.0000 mg/m2 | Freq: Once | INTRAVENOUS | Status: AC
  Administered 2018-12-02: 700 mg via INTRAVENOUS
  Filled 2018-12-02: qty 20

## 2018-12-02 MED ORDER — ACETAMINOPHEN 325 MG PO TABS
ORAL_TABLET | ORAL | Status: AC
  Filled 2018-12-02: qty 2

## 2018-12-02 MED ORDER — DIPHENHYDRAMINE HCL 25 MG PO CAPS
50.0000 mg | ORAL_CAPSULE | Freq: Once | ORAL | Status: AC
  Administered 2018-12-02: 50 mg via ORAL

## 2018-12-02 MED ORDER — HEPARIN SOD (PORK) LOCK FLUSH 100 UNIT/ML IV SOLN
500.0000 [IU] | Freq: Once | INTRAVENOUS | Status: AC | PRN
  Administered 2018-12-02: 500 [IU]
  Filled 2018-12-02: qty 5

## 2018-12-02 MED ORDER — SODIUM CHLORIDE 0.9 % IV SOLN
Freq: Once | INTRAVENOUS | Status: AC
  Administered 2018-12-02: 09:00:00 via INTRAVENOUS
  Filled 2018-12-02: qty 250

## 2018-12-02 MED ORDER — SODIUM CHLORIDE 0.9% FLUSH
10.0000 mL | INTRAVENOUS | Status: DC | PRN
  Administered 2018-12-02: 12:00:00 10 mL
  Filled 2018-12-02: qty 10

## 2018-12-02 MED ORDER — ACETAMINOPHEN 325 MG PO TABS
650.0000 mg | ORAL_TABLET | Freq: Once | ORAL | Status: AC
  Administered 2018-12-02: 650 mg via ORAL

## 2018-12-02 MED ORDER — PEGFILGRASTIM INJECTION 6 MG/0.6ML ~~LOC~~
PREFILLED_SYRINGE | SUBCUTANEOUS | Status: AC
  Filled 2018-12-02: qty 0.6

## 2018-12-02 MED ORDER — PEGFILGRASTIM INJECTION 6 MG/0.6ML ~~LOC~~
6.0000 mg | PREFILLED_SYRINGE | Freq: Once | SUBCUTANEOUS | Status: AC
  Administered 2018-12-02: 6 mg via SUBCUTANEOUS

## 2018-12-02 MED ORDER — DIPHENHYDRAMINE HCL 25 MG PO CAPS
ORAL_CAPSULE | ORAL | Status: AC
  Filled 2018-12-02: qty 2

## 2018-12-02 NOTE — Patient Instructions (Signed)
[facility] Discharge Instructions for Patients Receiving Chemotherapy  Today you received the following chemotherapy agents: rituxin  To help prevent nausea and vomiting after your treatment, we encourage you to take your nausea medicationAs directedIf you develop nausea and vomiting that is not controlled by your nausea medication, call the clinic.   BELOW ARE SYMPTOMS THAT SHOULD BE REPORTED IMMEDIATELY:  *FEVER GREATER THAN 100.5 F  *CHILLS WITH OR WITHOUT FEVER  NAUSEA AND VOMITING THAT IS NOT CONTROLLED WITH YOUR NAUSEA MEDICATION  *UNUSUAL SHORTNESS OF BREATH  *UNUSUAL BRUISING OR BLEEDING  TENDERNESS IN MOUTH AND THROAT WITH OR WITHOUT PRESENCE OF ULCERS  *URINARY PROBLEMS  *BOWEL PROBLEMS  UNUSUAL RASH Items with * indicate a potential emergency and should be followed up as soon as possible.  Feel free to call the clinic should you have any questions or concerns. The clinic phone number is [phone].  Please show the [facility] at check-in to the Emergency Department and triage nurse.   Rituximab injection What is this medicine? RITUXIMAB (ri TUX i mab) is a monoclonal antibody. It is used to treat certain types of cancer like non-Hodgkin lymphoma and chronic lymphocytic leukemia. It is also used to treat rheumatoid arthritis, granulomatosis with polyangiitis (or Wegener's granulomatosis), microscopic polyangiitis, and pemphigus vulgaris. This medicine may be used for other purposes; ask your health care provider or pharmacist if you have questions. COMMON BRAND NAME(S): Rituxan What should I tell my health care provider before I take this medicine? They need to know if you have any of these conditions: -heart disease -infection (especially a virus infection such as hepatitis B, chickenpox, cold sores, or herpes) -immune system problems -irregular heartbeat -kidney disease -low blood counts, like low white cell, platelet, or red cell  counts -lung or breathing disease, like asthma -recently received or scheduled to receive a vaccine -an unusual or allergic reaction to rituximab, other medicines, foods, dyes, or preservatives -pregnant or trying to get pregnant -breast-feeding How should I use this medicine? This medicine is for infusion into a vein. It is administered in a hospital or clinic by a specially trained health care professional. A special MedGuide will be given to you by the pharmacist with each prescription and refill. Be sure to read this information carefully each time. Talk to your pediatrician regarding the use of this medicine in children. This medicine is not approved for use in children. Overdosage: If you think you have taken too much of this medicine contact a poison control center or emergency room at once. NOTE: This medicine is only for you. Do not share this medicine with others. What if I miss a dose? It is important not to miss a dose. Call your doctor or health care professional if you are unable to keep an appointment. What may interact with this medicine? -cisplatin -live virus vaccines This list may not describe all possible interactions. Give your health care provider a list of all the medicines, herbs, non-prescription drugs, or dietary supplements you use. Also tell them if you smoke, drink alcohol, or use illegal drugs. Some items may interact with your medicine. What should I watch for while using this medicine? Your condition will be monitored carefully while you are receiving this medicine. You may need blood work done while you are taking this medicine. This medicine can cause serious allergic reactions. To reduce your risk you may need to take medicine before treatment with this medicine. Take your medicine as directed. In some patients,  this medicine may cause a serious brain infection that may cause death. If you have any problems seeing, thinking, speaking, walking, or standing, tell  your healthcare professional right away. If you cannot reach your healthcare professional, urgently seek other source of medical care. Call your doctor or health care professional for advice if you get a fever, chills or sore throat, or other symptoms of a cold or flu. Do not treat yourself. This drug decreases your body's ability to fight infections. Try to avoid being around people who are sick. Do not become pregnant while taking this medicine or for 12 months after stopping it. Women should inform their doctor if they wish to become pregnant or think they might be pregnant. There is a potential for serious side effects to an unborn child. Talk to your health care professional or pharmacist for more information. Do not breast-feed an infant while taking this medicine or for 6 months after stopping it. What side effects may I notice from receiving this medicine? Side effects that you should report to your doctor or health care professional as soon as possible: -allergic reactions like skin rash, itching or hives; swelling of the face, lips, or tongue -breathing problems -chest pain -changes in vision -diarrhea -headache with fever, neck stiffness, sensitivity to light, nausea, or confusion -fast, irregular heartbeat -loss of memory -low blood counts - this medicine may decrease the number of white blood cells, red blood cells and platelets. You may be at increased risk for infections and bleeding. -mouth sores -problems with balance, talking, or walking -redness, blistering, peeling or loosening of the skin, including inside the mouth -signs of infection - fever or chills, cough, sore throat, pain or difficulty passing urine -signs and symptoms of kidney injury like trouble passing urine or change in the amount of urine -signs and symptoms of liver injury like dark yellow or brown urine; general ill feeling or flu-like symptoms; light-colored stools; loss of appetite; nausea; right upper belly  pain; unusually weak or tired; yellowing of the eyes or skin -signs and symptoms of low blood pressure like dizziness; feeling faint or lightheaded, falls; unusually weak or tired -stomach pain -swelling of the ankles, feet, hands -unusual bleeding or bruising -vomiting Side effects that usually do not require medical attention (report to your doctor or health care professional if they continue or are bothersome): -headache -joint pain -muscle cramps or muscle pain -nausea -tiredness This list may not describe all possible side effects. Call your doctor for medical advice about side effects. You may report side effects to FDA at 1-800-FDA-1088. Where should I keep my medicine? This drug is given in a hospital or clinic and will not be stored at home. NOTE: This sheet is a summary. It may not cover all possible information. If you have questions about this medicine, talk to your doctor, pharmacist, or health care provider.  2019 [employer]/Gold Standard (2017-06-22 13:04:32)

## 2018-12-05 NOTE — Progress Notes (Signed)
HEMATOLOGY/ONCOLOGY CLINIC NOTE  Date of Service: 12/09/2018  Patient Care Team: Taylor Cha, MD as PCP - General (Internal Medicine)  CHIEF COMPLAINTS/PURPOSE OF CONSULTATION:  Large B-Cell Lymphoma  Oncologic History:  Taylor Huynh presented with extranodal involvement in the terminal ileum s/p gross tumor resection in November 2010, and was diagnosed with High Grade B-Cell Non-Hodgkin's Lymphoma. She was treated with 4 cycles of R-CHOP between 07/05/09 and 09/06/09.  HISTORY OF PRESENTING ILLNESS:   Taylor Huynh is a wonderful 78 y.o. female who has been referred to [place] by Dr. Lawerance Cruel for evaluation and management of Large B-Cell Lymphoma. She is accompanied today by her daugher. The pt reports that she is doing well overall.  The pt reports that she found a lump in her right groin just before 07/17/18. She was evaluated by her PCP on 07/18/18 with a CT A/P, as noted below. She denies this lump changing much since she first noticed it, and endorses some soreness. The pt denies fevers, chills, night sweats or unexpected weight loss. Prior to this, the patient denies any changes in her quality of life or ability to function. She notes that her right leg became swollen yesterday as well. She denies changes in her bowel movements, abdominal pains, and left leg swelling. She denies any problems passing urine nor blood in the urine. She denies heart problems or recent chest pain or SOB. She denies changes in vision or headaches.   The pt has been eating well and endorses good appetite.   The pt takes Amlodipine every other day. She takes 19m Prilosec every day, as well as Vitamin B12 replacement. She denies stroke history, lung problems, nor heart problems. She notes a history of minor seizures, which she hasn't had since she began B12 replacement several years ago. The pt has never smoked cigarettes. She notes that she does not need help with any daily  activities and lives with her husband who also functions independently. She attends a local gym regularly.   Of note prior to the patient's visit today, pt has had a CT A/P completed on 07/18/18 with results revealing Extensive abdominal-pelvic adenopathy, consistent with recurrent lymphoma. 2. Tiny hiatal hernia. 3. Pelvic floor laxity. Aortic Atherosclerosis.  Most recent lab results (08/06/18) of CBC is as follows: all values are WNL except for RBC at 3.75, HGB at 10.6, HCT at 34.0  On review of systems, pt reports sore right inguinal lump, right leg swelling, good energy levels, sore right thigh, and denies changes in vision, headaches, abdominal pains, changes in bowel habits, problems passing urine, blood in the urine, left leg swelling, fevers, chills, night sweats, noticing other lumps or bumps, back pain, pain along the spine, calf pain, and any other symptoms.   On PMHx the pt reports High Grade B-Cell Non-Hodgkin's lymphoma in 2010, HTN, Vitamin B12 deficiency, minor seizures, GERD. On Social Hx the pt denies chemical exposure.  Interval History:   AMALORIE BIGFORDreturns today for management and evaluation of her newly diagnosed Recurrent/New High Grade B-Cell Non-Hodgkin's Lymphoma. I last saw the pt on 11/29/18 with C5 EPOCH-R discharge.   The pt reports that she has been doing better each day since her last discharge. She notes that she is beginning to slowly eat better each day and notes that she is eating fair. She notes that she has been making a point to get out of her house each day to walk, while avoiding crowds and individuals  with infections. She denies fevers, chills, night sweats or concerns for infections. She notes that she is moving her bowels well.   Lab results today (12/09/18) of CBC w/diff and CMP is as follows: all values are WNL except for RBC at 3.14, HGB at 9.0, HCT at 29.7, Lymphs abs at 500, Abs immature granulocytes at 0.38k, Potassium at 3.4, Glucose at 104,  Creatinine at 1.01, Calcium at 8.1, Total Protein at 6.0, Albumin at 3.2, AST at 11, Total Bilirubin at 0.2, GFR at 53.  On review of systems, pt reports improving energy levels, moving her bowels well, eating fair, and denies mouth sores, fevers, chills, night sweats, abdominal pains, concerns for infections, and any other symptoms.   MEDICAL HISTORY:  Past Medical History:  Diagnosis Date   GERD (gastroesophageal reflux disease)    HH (hiatus hernia) 12/12/2011   Hypertension    Leukopenia 12/12/2011   nhl dx'd 2010   Seizures ([place]) reports "years ago"   Syncope 12/12/2011   Hx seizures - not documented - age 24; recent syncope 6/12 and x2, 12/12; negative MRI brain 1/13    SURGICAL HISTORY: Past Surgical History:  Procedure Laterality Date   COLON SURGERY     IR IMAGING GUIDED PORT INSERTION  08/23/2018    SOCIAL HISTORY: Social History   Socioeconomic History   Marital status: Married    Spouse name: Not on file   Number of children: Not on file   Years of education: Not on file   Highest education level: Not on file  Occupational History   Not on file  Social Needs   Financial resource strain: Not on file   Food insecurity:    Worry: Not on file    Inability: Not on file   Transportation needs:    Medical: Not on file    Non-medical: Not on file  Tobacco Use   Smoking status: Never Smoker   Smokeless tobacco: Never Used  Substance and Sexual Activity   Alcohol use: No   Drug use: No   Sexual activity: Not on file  Lifestyle   Physical activity:    Days per week: Not on file    Minutes per session: Not on file   Stress: Not on file  Relationships   Social connections:    Talks on phone: Not on file    Gets together: Not on file    Attends religious service: Not on file    Active member of club or organization: Not on file    Attends meetings of clubs or organizations: Not on file    Relationship status: Not on file   Intimate  partner violence:    Fear of current or ex partner: Not on file    Emotionally abused: Not on file    Physically abused: Not on file    Forced sexual activity: Not on file  Other Topics Concern   Not on file  Social History Narrative   Not on file    FAMILY HISTORY: No family history on file.  ALLERGIES:  has No Known Allergies.  MEDICATIONS:  Current Outpatient Medications  Medication Sig Dispense Refill   amLODipine (NORVASC) 10 MG tablet Take 10 mg by mouth every other day.      cholecalciferol (VITAMIN D) 1000 UNITS tablet Take 1,000 Units by mouth daily.       lidocaine-prilocaine (EMLA) cream Apply 1 application topically as needed. (Patient taking differently: Apply 1 application topically as needed (port access). ) 30  g 1   Melatonin 5 MG TABS Take 5 mg by mouth at bedtime.      omeprazole (PRILOSEC) 20 MG capsule Take 20 mg by mouth daily.     polyethylene glycol (MIRALAX / GLYCOLAX) packet Take 17 g by mouth daily as needed. 14 each 0   potassium chloride SA (K-DUR,KLOR-CON) 20 MEQ tablet Take 1 tablet (20 mEq total) by mouth 2 (two) times daily. 30 tablet 0   senna-docusate (SENOKOT-S) 8.6-50 MG tablet Take 2 tablets by mouth at bedtime as needed for mild constipation. (Patient not taking: Reported on 11/25/2018) 60 tablet 1   vitamin B-12 (CYANOCOBALAMIN) 1000 MCG tablet Take 1,000 mcg by mouth every other day.     No current facility-administered medications for this visit.     REVIEW OF SYSTEMS:    A 10+ POINT REVIEW OF SYSTEMS WAS OBTAINED including neurology, dermatology, psychiatry, cardiac, respiratory, lymph, extremities, GI, GU, Musculoskeletal, constitutional, breasts, reproductive, HEENT.  All pertinent positives are noted in the HPI.  All others are negative.   PHYSICAL EXAMINATION: ECOG PERFORMANCE STATUS: 1 - Symptomatic but completely ambulatory  Vitals:   12/09/18 0905  BP: 134/60  Pulse: 83  Resp: 17  Temp: (!) 96.7 F (35.9 C)  SpO2:  100%   Filed Weights   12/09/18 0905  Weight: 160 lb 11.2 oz (72.9 kg)   .Body mass index is 27.58 kg/m.  GENERAL:alert, in no acute distress and comfortable SKIN: no acute rashes, no significant lesions EYES: conjunctiva are pink and non-injected, sclera anicteric OROPHARYNX: MMM, no exudates, no oropharyngeal erythema or ulceration NECK: supple, no JVD LYMPH:  no palpable lymphadenopathy in the cervical, axillary or inguinal regions LUNGS: clear to auscultation b/l with normal respiratory effort HEART: regular rate & rhythm ABDOMEN:  normoactive bowel sounds , non tender, not distended. No palpable hepatosplenomegaly.  Extremity: no pedal edema PSYCH: alert & oriented x 3 with fluent speech NEURO: no focal motor/sensory deficits    LABORATORY DATA:  I have reviewed the data as listed  . CBC Latest Ref Rng & Units 12/09/2018 11/29/2018 11/28/2018  WBC 4.0 - 10.5 K/uL 8.3 2.0(L) 3.7(L)  Hemoglobin 12.0 - 15.0 g/dL 9.0(L) 7.2(L) 8.5(L)  Hematocrit 36.0 - 46.0 % 29.7(L) 23.6(L) 28.7(L)  Platelets 150 - 400 K/uL 157 297 388    . CMP Latest Ref Rng & Units 12/09/2018 11/29/2018 11/28/2018  Glucose 70 - 99 mg/dL 104(H) 103(H) 115(H)  BUN 8 - 23 mg/dL _0 Creatinine 0.44 - 1.00 mg/dL 1.01(H) 0.71 0.85  Sodium 135 - 145 mmol/L 142 140 142  Potassium 3.5 - 5.1 mmol/L 3.4(L) 3.4(L) 3.7  Chloride 98 - 111 mmol/L 108 111 113(H)  CO2 22 - 32 mmol/L 23 23 21(L)  Calcium 8.9 - 10.3 mg/dL 8.1(L) 8.9 9.0  Total Protein 6.5 - 8.1 g/dL 6.0(L) - -  Total Bilirubin 0.3 - 1.2 mg/dL 0.2(L) - -  Alkaline Phos 38 - 126 U/L 66 - -  AST 15 - 41 U/L 11(L) - -  ALT 0 - 44 U/L 12 - -   08/06/18 Biopsy:    06/01/09 Pathology:     07/18/18 CBC w/diff:    RADIOGRAPHIC STUDIES: I have personally reviewed the radiological images as listed and agreed with the findings in the report.  08/14/18 US Venous RLE:   No results found.  ASSESSMENT & PLAN:   78 y.o. female with  1. 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 Newly Diagnosed Recurrent/New High Grade B-Cell Non-Hodgkin's Lymphoma, Stage IV FISH negative for findings of double hit lymphoma  07/18/18 CT A/P revealed Extensive abdominal-pelvic adenopathy, consistent with recurrent lymphoma. 2. Tiny hiatal hernia. 3. Pelvic floor laxity. Aortic Atherosclerosis.  Labs upon initial presentation from1/14/20, WBC normal at 4.3k, HGB at 10.6, PLT normal at 201k  08/06/18 Right Inguinal LN Biopsy which revealed Large B-Cell Lymphoma with a Ki67 of 90%. FISH is negative for rearrangements of BCL2, BCL6, and MYC. 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  08/13/18 Hep B and Hep C negative  08/21/18 ECHO which revealed LV EF of 50-55%  08/22/18 PET/CT revealedProgression, since 14/43/1540, of hypermetabolic abdominopelvic adenopathy, consistent with active lymphoma. 2. CT occult hypermetabolic osseous foci, also most consistent with active lymphoma. 3. No evidence of soft tissue disease above the diaphragm. 4. Focus of hypermetabolism within the anus is likely physiologic. Consider physical exam correlation. 5. Aortic Atherosclerosis.  10/24/18 PET/CT revealed Marked response to therapy of abdominopelvic lymphoma. Residual right pelvic hypermetabolic nodes. ([place] 5). 2. No new sites of disease. 3. Marrow hypermetabolism is likely due to stimulation by chemotherapy. 4. Coronary artery atherosclerosis. Aortic Atherosclerosis.   S/p 5 cycles of EPOCH-R completed on 11/29/18, dose reduced for age and  previous chemotherapy exposure.  3. RLE swelling r/o DVT. Could be from lymphedema related to her new/recurrent NHL 1/21/10US Venous RLE to rule out DVT - reviewed - no VTE  4. Hypokalemia.resolved- likely from steroids.  5. Pancytopenia due to chemotherapy and DLBCL  6. 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 No evidence of bleeding  7. DVT Px - lovenox [place]  PLAN: -Discussed pt labwork today, 12/09/18; blood counts improving, potassium borderline low, and other chemistries stable -Recommend consuming more potassium rich foods and liquids including bananas, orange juice and coconut water -Discussed considerations of C6 EPOCH-R and the indications to do so. Pt strongly prefers to cease treatment now, after completing 5 cycles of EPOCH-R. -Will order PET/CT to be completed in 5 weeks -Recommended that the pt continue to eat well, drink at least 48-64 oz of water each day, and walk 20-30 minutes each day.  -Advised infection prevention strategies, crowd avoidance, and frequent hand washing -Miralax and Senokot as needed. -Encouraged patient to optimize po intake -Will see the pt back in 6 weeks   PET/CT in 5 weeks RTC with Dr Irene Limbo with labs in 6 weeks   All of the patients questions were answered with apparent satisfaction. The patient knows to call the clinic with any problems, questions or concerns.  The total time spent in the appt was 20 minutes and more than 50% was on counseling and direct patient cares.    Sullivan Lone MD MS AAHIVMS [facility] Hematology/Oncology Physician [facility]  (Office):       [phone] (Work cell):  [phone] (Fax):           [phone]  12/09/2018 9:34 AM  I, Baldwin [place], am acting as a scribe for Dr. Sullivan Lone.   .I have reviewed the above documentation for accuracy and completeness, and I agree with the above. Brunetta Genera MD

## 2018-12-09 ENCOUNTER — Inpatient Hospital Stay: Payer: Medicare Other | Admitting: Hematology

## 2018-12-09 ENCOUNTER — Telehealth: Payer: Self-pay | Admitting: Hematology

## 2018-12-09 ENCOUNTER — Other Ambulatory Visit: Payer: Self-pay

## 2018-12-09 ENCOUNTER — Inpatient Hospital Stay: Payer: Medicare Other

## 2018-12-09 VITALS — BP 134/60 | HR 83 | Temp 96.7°F | Resp 17 | Ht 64.0 in | Wt 160.7 lb

## 2018-12-09 DIAGNOSIS — E538 Deficiency of other specified B group vitamins: Secondary | ICD-10-CM

## 2018-12-09 DIAGNOSIS — C8338 Diffuse large B-cell lymphoma, lymph nodes of multiple sites: Secondary | ICD-10-CM

## 2018-12-09 DIAGNOSIS — D6489 Other specified anemias: Secondary | ICD-10-CM

## 2018-12-09 DIAGNOSIS — I10 Essential (primary) hypertension: Secondary | ICD-10-CM | POA: Diagnosis not present

## 2018-12-09 DIAGNOSIS — Z79899 Other long term (current) drug therapy: Secondary | ICD-10-CM

## 2018-12-09 DIAGNOSIS — Z5112 Encounter for antineoplastic immunotherapy: Secondary | ICD-10-CM | POA: Diagnosis not present

## 2018-12-09 DIAGNOSIS — Z7901 Long term (current) use of anticoagulants: Secondary | ICD-10-CM

## 2018-12-09 DIAGNOSIS — Z86718 Personal history of other venous thrombosis and embolism: Secondary | ICD-10-CM

## 2018-12-09 LAB — CMP (CANCER CENTER ONLY)
ALT: 12 U/L (ref 0–44)
AST: 11 U/L — ABNORMAL LOW (ref 15–41)
Albumin: 3.2 g/dL — ABNORMAL LOW (ref 3.5–5.0)
Alkaline Phosphatase: 66 U/L (ref 38–126)
Anion gap: 11 (ref 5–15)
BUN: 9 mg/dL (ref 8–23)
CO2: 23 mmol/L (ref 22–32)
Calcium: 8.1 mg/dL — ABNORMAL LOW (ref 8.9–10.3)
Chloride: 108 mmol/L (ref 98–111)
Creatinine: 1.01 mg/dL — ABNORMAL HIGH (ref 0.44–1.00)
GFR, Est AFR Am: >60 mL/min (ref >60)
GFR, Estimated: 53 mL/min — ABNORMAL LOW (ref >60)
Glucose, Bld: 104 mg/dL — ABNORMAL HIGH (ref 70–99)
Potassium: 3.4 mmol/L — ABNORMAL LOW (ref 3.5–5.1)
Sodium: 142 mmol/L (ref 135–145)
Total Bilirubin: 0.2 mg/dL — ABNORMAL LOW (ref 0.3–1.2)
Total Protein: 6 g/dL — ABNORMAL LOW (ref 6.5–8.1)

## 2018-12-09 LAB — CBC WITH DIFFERENTIAL/PLATELET
Abs Immature Granulocytes: 0.38 10*3/uL — ABNORMAL HIGH (ref 0.00–0.07)
Basophils Absolute: 0.1 10*3/uL (ref 0.0–0.1)
Basophils Relative: 1 %
Eosinophils Absolute: 0.1 10*3/uL (ref 0.0–0.5)
Eosinophils Relative: 1 %
HCT: 29.7 % — ABNORMAL LOW (ref 36.0–46.0)
Hemoglobin: 9 g/dL — ABNORMAL LOW (ref 12.0–15.0)
Immature Granulocytes: 5 %
Lymphocytes Relative: 6 %
Lymphs Abs: 0.5 10*3/uL — ABNORMAL LOW (ref 0.7–4.0)
MCH: 28.7 pg (ref 26.0–34.0)
MCHC: 30.3 g/dL (ref 30.0–36.0)
MCV: 94.6 fL (ref 80.0–100.0)
Monocytes Absolute: 0.8 10*3/uL (ref 0.1–1.0)
Monocytes Relative: 10 %
Neutro Abs: 6.4 10*3/uL (ref 1.7–7.7)
Neutrophils Relative %: 77 %
Platelets: 157 10*3/uL (ref 150–400)
RBC: 3.14 MIL/uL — ABNORMAL LOW (ref 3.87–5.11)
RDW: 15.2 % (ref 11.5–15.5)
WBC: 8.3 10*3/uL (ref 4.0–10.5)
nRBC: 0.2 % (ref 0.0–0.2)

## 2018-12-09 NOTE — Telephone Encounter (Signed)
Scheduled appt per 5/18 los. ° °Patient aware of appt date and time. °

## 2019-01-14 ENCOUNTER — Other Ambulatory Visit: Payer: Self-pay

## 2019-01-14 ENCOUNTER — Encounter (HOSPITAL_COMMUNITY)
Admission: RE | Admit: 2019-01-14 | Discharge: 2019-01-14 | Disposition: A | Discharge status: Home / Self Care | Payer: Medicare Other | Source: Ambulatory Visit | Attending: Hematology | Admitting: Hematology

## 2019-01-14 DIAGNOSIS — I10 Essential (primary) hypertension: Secondary | ICD-10-CM | POA: Insufficient documentation

## 2019-01-14 DIAGNOSIS — Z79899 Other long term (current) drug therapy: Secondary | ICD-10-CM | POA: Insufficient documentation

## 2019-01-14 DIAGNOSIS — C8338 Diffuse large B-cell lymphoma, lymph nodes of multiple sites: Secondary | ICD-10-CM

## 2019-01-14 DIAGNOSIS — K219 Gastro-esophageal reflux disease without esophagitis: Secondary | ICD-10-CM | POA: Insufficient documentation

## 2019-01-14 LAB — GLUCOSE, CAPILLARY: Glucose-Capillary: 92 mg/dL (ref 70–99)

## 2019-01-14 MED ORDER — FLUDEOXYGLUCOSE F - 18 (FDG) INJECTION
7.9000 | Freq: Once | INTRAVENOUS | Status: AC | PRN
  Administered 2019-01-14: 7.9 via INTRAVENOUS

## 2019-01-15 NOTE — Progress Notes (Signed)
HEMATOLOGY/ONCOLOGY CLINIC NOTE  Date of Service: 01/20/2019  Patient Care Team: [name], MD as PCP - General (Internal Medicine)  CHIEF COMPLAINTS/PURPOSE OF CONSULTATION:  Large B-Cell Lymphoma  Oncologic History:  Taylor Huynh presented with extranodal involvement in the terminal ileum s/p gross tumor resection in November 2010, and was diagnosed with High Grade B-Cell Non-Hodgkin's Lymphoma. She was treated with 4 cycles of R-CHOP between 07/05/09 and 09/06/09.  HISTORY OF PRESENTING ILLNESS:   Taylor Huynh is a wonderful 78 y.o. female who has been referred to [place] by Dr. Lawerance Cruel for evaluation and management of Large B-Cell Lymphoma. She is accompanied today by her daugher. The pt reports that she is doing well overall.  The pt reports that she found a lump in her right groin just before 07/17/18. She was evaluated by her PCP on 07/18/18 with a CT A/P, as noted below. She denies this lump changing much since she first noticed it, and endorses some soreness. The pt denies fevers, chills, night sweats or unexpected weight loss. Prior to this, the patient denies any changes in her quality of life or ability to function. She notes that her right leg became swollen yesterday as well. She denies changes in her bowel movements, abdominal pains, and left leg swelling. She denies any problems passing urine nor blood in the urine. She denies heart problems or recent chest pain or SOB. She denies changes in vision or headaches.   The pt has been eating well and endorses good appetite.   The pt takes Amlodipine every other day. She takes 88m Prilosec every day, as well as Vitamin B12 replacement. She denies stroke history, lung problems, nor heart problems. She notes a history of minor seizures, which she hasn't had since she began B12 replacement several years ago. The pt has never smoked cigarettes. She notes that she does not need help with any daily  activities and lives with her husband who also functions independently. She attends a local gym regularly.   Of note prior to the patient's visit today, pt has had a CT A/P completed on 07/18/18 with results revealing Extensive abdominal-pelvic adenopathy, consistent with recurrent lymphoma. 2. Tiny hiatal hernia. 3. Pelvic floor laxity. Aortic Atherosclerosis.  Most recent lab results (08/06/18) of CBC is as follows: all values are WNL except for RBC at 3.75, HGB at 10.6, HCT at 34.0  On review of systems, pt reports sore right inguinal lump, right leg swelling, good energy levels, sore right thigh, and denies changes in vision, headaches, abdominal pains, changes in bowel habits, problems passing urine, blood in the urine, left leg swelling, fevers, chills, night sweats, noticing other lumps or bumps, back pain, pain along the spine, calf pain, and any other symptoms.   On PMHx the pt reports High Grade B-Cell Non-Hodgkin's lymphoma in 2010, HTN, Vitamin B12 deficiency, minor seizures, GERD. On Social Hx the pt denies chemical exposure.  Interval History:   Taylor ALGEOreturns today for management and evaluation of her Recurrent/New High Grade B-Cell Non-Hodgkin's Lymphoma. The patient's last [place] on [place]. The pt reports that she is doing well overall.  The pt reports that she has not developed any new concerns and feels that she has "bounced back from the chemotherapy." She notes that her weight has been stable, she's been eating well, and notes that she has been fairly active. She denies leg swelling, abdominal pains, hemorrhoidal bleeding, or changes in breathing. She denies  fevers, chills or night sweats.  She notes that she may be able to feel the inguinal lymph nodes in her right groin, but hasn't been sure, as noted on the 01/14/19 PET/CT, as noted below.  Of note since the patient's last visit, pt has had a PET/CT completed on 01/14/19 with results revealing "Two  progressive right inguinal nodes, as above (Deauville criteria 5). Otherwise, no suspicious lymphadenopathy in the neck, chest, abdomen, or pelvis. Spleen is normal in size."  Lab results today (01/20/19) of CBC w/diff and CMP is as follows: all values are WNL except for WBC at 3.4k, RBC at 3.52, HGB at 10.1, HCT at 32.1, Calcium at 8.6, Total Protein at 6.2. 01/20/19 LDH at 208  On review of systems, pt reports stable weight, eating well, staying active, moving her bowels well, and denies concerns for infections, fevers, chills, night sweats, leg swelling, abdominal pains, hemorrhoidal bleeding, changes in breathing, mouth soreness, constipation, and any other symptoms.   MEDICAL HISTORY:  Past Medical History:  Diagnosis Date   GERD (gastroesophageal reflux disease)    HH (hiatus hernia) 12/12/2011   Hypertension    Leukopenia 12/12/2011   nhl dx'd 2010   Seizures ([place]) reports "years ago"   Syncope 12/12/2011   Hx seizures - not documented - age 73; recent syncope 6/12 and x2, 12/12; negative MRI brain 1/13    SURGICAL HISTORY: Past Surgical History:  Procedure Laterality Date   COLON SURGERY     IR IMAGING GUIDED PORT INSERTION  08/23/2018    SOCIAL HISTORY: Social History   Socioeconomic History   Marital status: Married    Spouse name: Not on file   Number of children: Not on file   Years of education: Not on file   Highest education level: Not on file  Occupational History   Not on file  Social Needs   Financial resource strain: Not on file   Food insecurity    Worry: Not on file    Inability: Not on file   Transportation needs    Medical: Not on file    Non-medical: Not on file  Tobacco Use   Smoking status: Never Smoker   Smokeless tobacco: Never Used  Substance and Sexual Activity   Alcohol use: No   Drug use: No   Sexual activity: Not on file  Lifestyle   Physical activity    Days per week: Not on file    Minutes per session: Not on  file   Stress: Not on file  Relationships   Social connections    Talks on phone: Not on file    Gets together: Not on file    Attends religious service: Not on file    Active member of club or organization: Not on file    Attends meetings of clubs or organizations: Not on file    Relationship status: Not on file   Intimate partner violence    Fear of current or ex partner: Not on file    Emotionally abused: Not on file    Physically abused: Not on file    Forced sexual activity: Not on file  Other Topics Concern   Not on file  Social History Narrative   Not on file    FAMILY HISTORY: History reviewed. No pertinent family history.  ALLERGIES:  has No Known Allergies.  MEDICATIONS:  Current Outpatient Medications  Medication Sig Dispense Refill   amLODipine (NORVASC) 10 MG tablet Take 10 mg by mouth every other day.  cholecalciferol (VITAMIN D) 1000 UNITS tablet Take 1,000 Units by mouth daily.       lidocaine-prilocaine (EMLA) cream Apply 1 application topically as needed. (Patient taking differently: Apply 1 application topically as needed (port access). ) 30 g 1   Melatonin 5 MG TABS Take 5 mg by mouth at bedtime.      omeprazole (PRILOSEC) 20 MG capsule Take 20 mg by mouth daily.     polyethylene glycol (MIRALAX / GLYCOLAX) packet Take 17 g by mouth daily as needed. 14 each 0   potassium chloride SA (K-DUR,KLOR-CON) 20 MEQ tablet Take 1 tablet (20 mEq total) by mouth 2 (two) times daily. 30 tablet 0   senna-docusate (SENOKOT-S) 8.6-50 MG tablet Take 2 tablets by mouth at bedtime as needed for mild constipation. (Patient not taking: Reported on 11/25/2018) 60 tablet 1   vitamin B-12 (CYANOCOBALAMIN) 1000 MCG tablet Take 1,000 mcg by mouth every other day.     No current facility-administered medications for this visit.     REVIEW OF SYSTEMS:    A 10+ POINT REVIEW OF SYSTEMS WAS OBTAINED including neurology, dermatology, psychiatry, cardiac, respiratory,  lymph, extremities, GI, GU, Musculoskeletal, constitutional, breasts, reproductive, HEENT.  All pertinent positives are noted in the HPI.  All others are negative.   PHYSICAL EXAMINATION: ECOG PERFORMANCE STATUS: 1 - Symptomatic but completely ambulatory  Vitals:   01/20/19 0849  BP: 140/63  Pulse: 87  Resp: 18  Temp: 97.7 F (36.5 C)  SpO2: 99%   Filed Weights   01/20/19 0849  Weight: 159 lb 14.4 oz (72.5 kg)   .Body mass index is 27.45 kg/m.  GENERAL:alert, in no acute distress and comfortable SKIN: no acute rashes, no significant lesions EYES: conjunctiva are pink and non-injected, sclera anicteric OROPHARYNX: MMM, no exudates, no oropharyngeal erythema or ulceration NECK: supple, no JVD LYMPH: Just palpable right inguinal lymph node. No other palpable lymphadenopathy. LUNGS: clear to auscultation b/l with normal respiratory effort HEART: regular rate & rhythm ABDOMEN:  normoactive bowel sounds , non tender, not distended. No palpable hepatosplenomegaly.  Extremity: no pedal edema PSYCH: alert & oriented x 3 with fluent speech NEURO: no focal motor/sensory deficits   LABORATORY DATA:  I have reviewed the data as listed  . CBC Latest Ref Rng & Units 01/20/2019 12/09/2018 11/29/2018  WBC 4.0 - 10.5 K/uL 3.4(L) 8.3 2.0(L)  Hemoglobin 12.0 - 15.0 g/dL 10.1(L) 9.0(L) 7.2(L)  Hematocrit 36.0 - 46.0 % 32.1(L) 29.7(L) 23.6(L)  Platelets 150 - 400 K/uL 204 157 297    . CMP Latest Ref Rng & Units 01/20/2019 12/09/2018 11/29/2018  Glucose 70 - 99 mg/dL 98 104(H) 103(H)  BUN 8 - 23 mg/dL _0 Creatinine 0.44 - 1.00 mg/dL 0.80 1.01(H) 0.71  Sodium 135 - 145 mmol/L 141 142 140  Potassium 3.5 - 5.1 mmol/L 3.7 3.4(L) 3.4(L)  Chloride 98 - 111 mmol/L 110 108 111  CO2 22 - 32 mmol/L _1 Calcium 8.9 - 10.3 mg/dL 8.6(L) 8.1(L) 8.9  Total Protein 6.5 - 8.1 g/dL 6.2(L) 6.0(L) -  Total Bilirubin 0.3 - 1.2 mg/dL 0.3 0.2(L) -  Alkaline Phos 38 - 126 U/L 61 66 -  AST 15 - 41 U/L  17 11(L) -  ALT 0 - 44 U/L 13 12 -   08/06/18 Biopsy:    06/01/09 Pathology:     07/18/18 CBC w/diff:    RADIOGRAPHIC STUDIES: I have personally reviewed the radiological images as listed and agreed with the  findings in the report.  08/14/18 US Venous RLE:   Nm Pet Image Restag (ps) Skull Base To Thigh  Result Date: 01/14/2019 CLINICAL DATA:  Subsequent treatment strategy for diffuse B-cell lymphoma. EXAM: NUCLEAR MEDICINE PET SKULL BASE TO THIGH TECHNIQUE: 7.9 mCi F-18 FDG was injected intravenously. Full-ring PET imaging was performed from the skull base to thigh after the radiotracer. CT data was obtained and used for attenuation correction and anatomic localization. Fasting blood glucose: 92 mg/dl COMPARISON:  PET-CT dated 10/24/2018 FINDINGS: Mediastinal blood pool activity: SUV max 2.7 Liver activity: SUV max 3.8 NECK: No hypermetabolic cervical lymphadenopathy. Incidental CT findings: none CHEST: No hypermetabolic thoracic lymphadenopathy. No suspicious pulmonary nodules. Incidental CT findings: Right chest port terminating at the cavoatrial junction. Mild atherosclerotic calcifications of the aortic arch. Coronary atherosclerosis of the LAD and left circumflex. ABDOMEN/PELVIS: Two progressive right inguinal nodes, including an inferior node measuring up to 2.2 cm short axis with max SUV 36.8 (series 4/image 181), previously 1.5 cm short axis with max SUV 9.7. Otherwise, no hypermetabolic abdominopelvic lymphadenopathy. No abnormal hypermetabolism in the liver, spleen, pancreas, or adrenal glands. Spleen is normal in size. Incidental CT findings: Prior cholecystectomy. 2.7 cm left upper pole renal cyst. Status post left hemicolectomy. Prior small bowel resection with suture line in the anterior lower pelvis. Left colonic diverticulosis, without evidence of diverticulitis. Atherosclerotic calcifications of the abdominal aorta and branch vessels. SKELETON: Prior diffuse osseous  hypermetabolism is no longer evident. No focal hypermetabolic activity to suggest skeletal metastasis. Incidental CT findings: Degenerative changes of the visualized thoracolumbar spine. IMPRESSION: Two progressive right inguinal nodes, as above (Deauville criteria 5). Otherwise, no suspicious lymphadenopathy in the neck, chest, abdomen, or pelvis. Spleen is normal in size. Electronically Signed   By: Julian Hy M.D.   On: 01/14/2019 09:31    ASSESSMENT & PLAN:   78 y.o. female with  1. 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 Newly Diagnosed Recurrent/New High Grade B-Cell Non-Hodgkin's Lymphoma, Stage IV FISH negative for findings of double hit lymphoma  07/18/18 CT A/P revealed Extensive abdominal-pelvic adenopathy, consistent with recurrent lymphoma. 2. Tiny hiatal hernia. 3. Pelvic floor laxity. Aortic Atherosclerosis.  Labs upon initial presentation from1/14/20, WBC normal at 4.3k, HGB at 10.6, PLT normal at 201k  08/06/18 Right Inguinal LN Biopsy which revealed Large B-Cell Lymphoma with a Ki67 of 90%. FISH is negative for rearrangements of BCL2, BCL6, and MYC. 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  08/13/18 Hep B and Hep C negative  08/21/18 ECHO which revealed LV EF of 50-55%  08/22/18 PET/CT revealedProgression, since 63/84/6659, of hypermetabolic abdominopelvic adenopathy, consistent with active lymphoma. 2. CT occult hypermetabolic osseous foci, also most consistent with active lymphoma. 3. No evidence of soft tissue disease above the  diaphragm. 4. Focus of hypermetabolism within the anus is likely physiologic. Consider physical exam correlation. 5. Aortic Atherosclerosis.  10/24/18 PET/CT revealed Marked response to therapy of abdominopelvic lymphoma. Residual right pelvic hypermetabolic nodes. (Deauville 5). 2. No new sites of disease. 3. Marrow hypermetabolism is likely due to stimulation by chemotherapy. 4. Coronary artery atherosclerosis. Aortic Atherosclerosis.   S/p 5 cycles of  EPOCH-R completed on 11/29/18, dose reduced for age and previous chemotherapy exposure. Discussed considerations of C6 EPOCH-R and the indications to do so. Pt strongly prefers to cease treatment now, after completing 5 cycles of EPOCH-R.   3. RLE swelling r/o DVT. Could be from lymphedema related to her new/recurrent NHL 1/21/10US Venous RLE to rule out DVT - reviewed - no VTE  4. Hypokalemia.resolved- likely from steroids.  5. Pancytopenia due to chemotherapy and DLBCL  6. 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 No evidence of bleeding  7. DVT Px - lovenox [place]  PLAN: -Discussed pt labwork today, 01/20/19; blood counts appropriately improving s/p chemotherapy. Chemistries improved. LDH borderline at 208 -Discussed the 01/14/19 PET/CT which revealed "Two progressive right inguinal nodes, as above (Deauville criteria 5). Otherwise, no suspicious lymphadenopathy in the neck, chest, abdomen, or pelvis. Spleen is normal in size." -Discussed that I recommend RT with Rad Onc for two remaining lymph nodes. Do not recommend further chemotherapy at this point. -case discussed with Dr Isidore Moos. -Recommend consuming more potassium rich foods and liquids including bananas, orange juice and coconut water -Advised infection prevention strategies, crowd avoidance, and frequent hand washing -Miralax and Senokot as needed. -Recommended that the pt continue to eat well, drink at least 48-64 oz of water each day, and walk 20-30 minutes each day. -Will see the pt back  in 1 month   -Radiation Oncology urgent referral for consideration of ISRT rt inguinal residual lymphoma -RTC with Dr Irene Limbo with labs and port flush appointment in 4-5 weeks   All of the patients questions were answered with apparent satisfaction. The patient knows to call the clinic with any problems, questions or concerns.  The total time spent in the appt was 20 minutes and more than 50% was on counseling and direct patient cares.    Sullivan Lone MD MS AAHIVMS [facility] Hematology/Oncology Physician [facility]  (Office):       [phone] (Work cell):  [phone] (Fax):           [phone]  01/20/2019 9:39 AM  I, Baldwin [place], am acting as a scribe for Dr. Sullivan Lone.   .I have reviewed the above documentation for accuracy and completeness, and I agree with the above. Brunetta Genera MD

## 2019-01-20 ENCOUNTER — Telehealth: Payer: Self-pay | Admitting: Hematology

## 2019-01-20 ENCOUNTER — Telehealth: Payer: Self-pay | Admitting: Radiation Oncology

## 2019-01-20 ENCOUNTER — Inpatient Hospital Stay: Payer: Medicare Other | Attending: Hematology

## 2019-01-20 ENCOUNTER — Inpatient Hospital Stay (HOSPITAL_BASED_OUTPATIENT_CLINIC_OR_DEPARTMENT_OTHER): Payer: Medicare Other | Admitting: Hematology

## 2019-01-20 ENCOUNTER — Encounter: Payer: Self-pay | Admitting: Hematology

## 2019-01-20 ENCOUNTER — Other Ambulatory Visit: Payer: Self-pay

## 2019-01-20 VITALS — BP 140/63 | HR 87 | Temp 97.7°F | Resp 18 | Ht 64.0 in | Wt 159.9 lb

## 2019-01-20 DIAGNOSIS — D61818 Other pancytopenia: Secondary | ICD-10-CM

## 2019-01-20 DIAGNOSIS — Z8669 Personal history of other diseases of the nervous system and sense organs: Secondary | ICD-10-CM

## 2019-01-20 DIAGNOSIS — C8330 Diffuse large B-cell lymphoma, unspecified site: Secondary | ICD-10-CM

## 2019-01-20 DIAGNOSIS — I700 Atherosclerosis of aorta: Secondary | ICD-10-CM | POA: Insufficient documentation

## 2019-01-20 DIAGNOSIS — D72819 Decreased white blood cell count, unspecified: Secondary | ICD-10-CM | POA: Insufficient documentation

## 2019-01-20 DIAGNOSIS — Z79899 Other long term (current) drug therapy: Secondary | ICD-10-CM | POA: Diagnosis not present

## 2019-01-20 DIAGNOSIS — D630 Anemia in neoplastic disease: Secondary | ICD-10-CM | POA: Insufficient documentation

## 2019-01-20 DIAGNOSIS — Z9221 Personal history of antineoplastic chemotherapy: Secondary | ICD-10-CM

## 2019-01-20 DIAGNOSIS — K219 Gastro-esophageal reflux disease without esophagitis: Secondary | ICD-10-CM | POA: Diagnosis not present

## 2019-01-20 DIAGNOSIS — I10 Essential (primary) hypertension: Secondary | ICD-10-CM | POA: Diagnosis not present

## 2019-01-20 DIAGNOSIS — C8338 Diffuse large B-cell lymphoma, lymph nodes of multiple sites: Secondary | ICD-10-CM

## 2019-01-20 DIAGNOSIS — K449 Diaphragmatic hernia without obstruction or gangrene: Secondary | ICD-10-CM | POA: Diagnosis not present

## 2019-01-20 LAB — CMP (CANCER CENTER ONLY)
ALT: 13 U/L (ref 0–44)
AST: 17 U/L (ref 15–41)
Albumin: 3.6 g/dL (ref 3.5–5.0)
Alkaline Phosphatase: 61 U/L (ref 38–126)
Anion gap: 7 (ref 5–15)
BUN: 17 mg/dL (ref 8–23)
CO2: 24 mmol/L (ref 22–32)
Calcium: 8.6 mg/dL — ABNORMAL LOW (ref 8.9–10.3)
Chloride: 110 mmol/L (ref 98–111)
Creatinine: 0.8 mg/dL (ref 0.44–1.00)
GFR, Est AFR Am: >60 mL/min (ref >60)
GFR, Estimated: >60 mL/min (ref >60)
Glucose, Bld: 98 mg/dL (ref 70–99)
Potassium: 3.7 mmol/L (ref 3.5–5.1)
Sodium: 141 mmol/L (ref 135–145)
Total Bilirubin: 0.3 mg/dL (ref 0.3–1.2)
Total Protein: 6.2 g/dL — ABNORMAL LOW (ref 6.5–8.1)

## 2019-01-20 LAB — CBC WITH DIFFERENTIAL/PLATELET
Abs Immature Granulocytes: 0.01 10*3/uL (ref 0.00–0.07)
Basophils Absolute: 0 10*3/uL (ref 0.0–0.1)
Basophils Relative: 0 %
Eosinophils Absolute: 0.3 10*3/uL (ref 0.0–0.5)
Eosinophils Relative: 9 %
HCT: 32.1 % — ABNORMAL LOW (ref 36.0–46.0)
Hemoglobin: 10.1 g/dL — ABNORMAL LOW (ref 12.0–15.0)
Immature Granulocytes: 0 %
Lymphocytes Relative: 20 %
Lymphs Abs: 0.7 10*3/uL (ref 0.7–4.0)
MCH: 28.7 pg (ref 26.0–34.0)
MCHC: 31.5 g/dL (ref 30.0–36.0)
MCV: 91.2 fL (ref 80.0–100.0)
Monocytes Absolute: 0.4 10*3/uL (ref 0.1–1.0)
Monocytes Relative: 13 %
Neutro Abs: 2 10*3/uL (ref 1.7–7.7)
Neutrophils Relative %: 58 %
Platelets: 204 10*3/uL (ref 150–400)
RBC: 3.52 MIL/uL — ABNORMAL LOW (ref 3.87–5.11)
RDW: 13.5 % (ref 11.5–15.5)
WBC: 3.4 10*3/uL — ABNORMAL LOW (ref 4.0–10.5)
nRBC: 0 % (ref 0.0–0.2)

## 2019-01-20 LAB — LACTATE DEHYDROGENASE: LDH: 208 U/L — ABNORMAL HIGH (ref 98–192)

## 2019-01-20 NOTE — Progress Notes (Signed)
Lymphoma Location(s) / Histology:  08/06/18 Diagnosis Lymph node for lymphoma, Right inguinal - LARGE B-CELL LYMPHOMA, SEE COMMENT.  Taylor Huynh presented with symptoms of: She discovered a lump in her right groin late December 2019 and brought it to the attention of her PCP.   Biopsies of right inguinal lymph node revealed: Large B-cell lymphoma  Past/Anticipated interventions by medical oncology, if any:  Dr. Irene Limbo 01/20/19 S/p 5 cycles of EPOCH-R completed on 11/29/18, dose reduced for age and previous chemotherapy exposure. Discussed considerations of C6 EPOCH-R and the indications to do so. Pt strongly prefers to cease treatment now, after completing 5 cycles of EPOCH-R.  PLAN: -Discussed pt labwork today, 01/20/19; blood counts appropriately improving s/p chemotherapy. Chemistries improved. LDH borderline at 208 -Discussed the 01/14/19 PET/CT which revealed "Two progressive right inguinal nodes, as above (Deauville criteria 5). Otherwise, no suspicious lymphadenopathy in the neck, chest, abdomen, or pelvis. Spleen is normal in size." -Discussed that I recommend RT with Rad Onc for two remaining lymph nodes. Do not recommend further chemotherapy at this point. -Recommend consuming more potassium rich foods and liquids including bananas, orange juice and coconut water -Advised infection prevention strategies, crowd avoidance, and frequent hand washing -Miralax and Senokot as needed. -Recommended that the pt continue to eat well, drink at least 48-64 oz of water each day, and walk 20-30 minutes each day. -Will see the pt back in 1 month   -Radiation Oncology urgent referral for consideration of ISRT rt inguinal residual lymphoma -RTC with Dr Irene Limbo with labs and port flush appointment in 4-5 weeks    Weight changes, if any, over the past 6 months: She denies. She does tell me that she cannot gain weight.   Recurrent fevers, or drenching night sweats, if any: No  SAFETY  ISSUES:  Prior radiation? No  Pacemaker/ICD? No  Possible current pregnancy? No  Is the patient on methotrexate? No  Current Complaints / other details:

## 2019-01-20 NOTE — Telephone Encounter (Signed)
Scheduled appt per 6/29 los  - gave pt AVS and calender per los.   

## 2019-01-20 NOTE — Telephone Encounter (Signed)
Contacted pt to verify telephone visit for pre reg °

## 2019-01-20 NOTE — Telephone Encounter (Signed)
New message:      LVM for patient to call back to schedule from referral receivced

## 2019-01-21 ENCOUNTER — Encounter: Payer: Self-pay | Admitting: Radiation Oncology

## 2019-01-21 ENCOUNTER — Other Ambulatory Visit: Payer: Self-pay

## 2019-01-21 ENCOUNTER — Ambulatory Visit
Admission: RE | Admit: 2019-01-21 | Discharge: 2019-01-21 | Disposition: A | Discharge status: Home / Self Care | Payer: Medicare Other | Source: Ambulatory Visit | Attending: Hematology | Admitting: Hematology

## 2019-01-21 ENCOUNTER — Ambulatory Visit
Admission: RE | Admit: 2019-01-21 | Discharge: 2019-01-21 | Disposition: A | Discharge status: Home / Self Care | Payer: Medicare Other | Source: Ambulatory Visit | Attending: Radiation Oncology | Admitting: Radiation Oncology

## 2019-01-21 DIAGNOSIS — C8338 Diffuse large B-cell lymphoma, lymph nodes of multiple sites: Secondary | ICD-10-CM

## 2019-01-21 NOTE — Progress Notes (Signed)
Radiation Oncology         [phone] ________________________________  Initial Outpatient Consultation  Name: Taylor Huynh MRN: [id number]  Date: 01/21/2019  DOB: [birth year]  [id number], Ronie Spies, MD  Brunetta Genera, MD   REFERRING PHYSICIAN: Brunetta Genera, MD  DIAGNOSIS:    ICD-10-CM   1. 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HCC)  C83.38    Stage IV NHL  CHIEF COMPLAINT: Here to discuss management of large B-cell lymphoma  HISTORY OF PRESENT ILLNESS::Taylor Huynh is an 78 y.o. female who presented with extranodal involvement in the terminal ileum s/p gross tumor resection in November 2010 and was diagnosed with High Grade B-Cell Non-Hodgkin's Lymphoma. She was treated with 4 cycles of R-CHOP between 07/05/09 and 09/06/09. More recently, she presented to her PCP in December 2019 with a 2-week history of right-sided abdominal lump. She denied this lump changing much since she first noticed it and reported some associated soreness. She denied any fevers, chills, night sweats, or unexpected weight loss. She was evaluated with CT scan of the abdomen/pelvis on 07/18/18 which showed extensive abdominal-pelvic adenopathy, consistent with recurrent lymphoma.  Biopsy of right inguinal lymph node on 08/06/18 revealed large B-cell lymphoma. FISH was negative for rearrangements of BCL2, BCL6, and MYC. Thus, the findings were consistent with a diffuse large B-cell lymphoma.  Initial PET scan from 08/22/18 showed progression, since December CT scan, of hypermetabolic abdominopelvic adenopathy, consistent with active lymphoma. CT occult hypermetabolic osseous foci, also most consistent with active lymphoma. No evidence of soft tissue disease above the diaphragm. I have reviewed her images personally.  She established medical oncology care with Dr. Irene Limbo and was started on chemotherapy with EPOCH-R on 09/02/18.  PET scan from 10/24/18 showed marked  response to therapy of abdominopelvic lymphoma. Residual right pelvic hypermetabolic nodes (Deauville 5). No new sites of disease.   She completed 5 cycles of EPOCH-R on 11/29/18.  PET scan from 01/14/19 showed two progressive right inguinal nodes, including an inferior node measuring up to 2.2 cm with max SUV 36.8 (Deauville 5). Otherwise no suspicious lymphadenopathy in the neck, chest, abdomen, or pelvis.  I have reviewed her images personally.  The patient reviewed the PET scan results with Dr. Irene Limbo and has kindly been referred today for discussion of radiation treatment to the two progressive right inguinal nodes.  On past medical history, the patient reports High Grade B-Cell Non-Hodgkin's lymphoma in 2010, HTN, Vitamin B12 deficiency, minor seizures, and GERD.  On social history, the patient has never smoked cigarettes. She notes that she does not need help with any daily activities and lives with her husband who also functions independently.  She is eating and drinking well, denies any obvious masses that she has been aware of.   Wt Readings from Last 3 Encounters:  01/20/19 159 lb 14.4 oz (72.5 kg)  12/09/18 160 lb 11.2 oz (72.9 kg)  12/02/18 162 lb 8 oz (73.7 kg)      PREVIOUS RADIATION THERAPY: No  PAST MEDICAL HISTORY:  has a past medical history of GERD (gastroesophageal reflux disease), HH (hiatus hernia) (12/12/2011), Hypertension, Leukopenia (12/12/2011), nhl (dx'd 2010), Seizures ([place]) (reports "years ago"), and Syncope (12/12/2011).    PAST SURGICAL HISTORY: Past Surgical History:  Procedure Laterality Date   COLON SURGERY  2010   due to cancer   IR IMAGING GUIDED PORT INSERTION  08/23/2018    FAMILY HISTORY: family history is not on file.  SOCIAL HISTORY:  reports that she has never smoked. She has never used smokeless tobacco. She reports that she does not drink alcohol or use drugs.  ALLERGIES: Patient has no known allergies.  MEDICATIONS:  Current Outpatient  Medications  Medication Sig Dispense Refill   amLODipine (NORVASC) 10 MG tablet Take 10 mg by mouth every other day.      cholecalciferol (VITAMIN D) 1000 UNITS tablet Take 1,000 Units by mouth daily.       lidocaine-prilocaine (EMLA) cream Apply 1 application topically as needed. (Patient taking differently: Apply 1 application topically as needed (port access). ) 30 g 1   Melatonin 5 MG TABS Take 5 mg by mouth at bedtime.      omeprazole (PRILOSEC) 20 MG capsule Take 20 mg by mouth daily.     vitamin B-12 (CYANOCOBALAMIN) 1000 MCG tablet Take 1,000 mcg by mouth every other day.     potassium chloride SA (K-DUR,KLOR-CON) 20 MEQ tablet Take 1 tablet (20 mEq total) by mouth 2 (two) times daily. (Patient not taking: Reported on 01/21/2019) 30 tablet 0   No current facility-administered medications for this encounter.     REVIEW OF SYSTEMS:  Notable for that above.   PHYSICAL EXAM:  vitals were not taken for this visit.   NA   LABORATORY DATA:  Lab Results  Component Value Date   WBC 3.4 (L) 01/20/2019   HGB 10.1 (L) 01/20/2019   HCT 32.1 (L) 01/20/2019   MCV 91.2 01/20/2019   PLT 204 01/20/2019   CMP     Component Value Date/Time   NA 141 01/20/2019 0810   NA 140 09/22/2013 1222   K 3.7 01/20/2019 0810   K 3.5 09/22/2013 1222   CL 110 01/20/2019 0810   CL 102 12/05/2011 0900   CO2 24 01/20/2019 0810   CO2 27 09/22/2013 1222   GLUCOSE 98 01/20/2019 0810   GLUCOSE 91 09/22/2013 1222   GLUCOSE 94 12/05/2011 0900   BUN 17 01/20/2019 0810   BUN 13.5 09/22/2013 1222   CREATININE 0.80 01/20/2019 0810   CREATININE 0.8 09/22/2013 1222   CALCIUM 8.6 (L) 01/20/2019 0810   CALCIUM 9.5 09/22/2013 1222   PROT 6.2 (L) 01/20/2019 0810   PROT 7.6 09/22/2013 1222   ALBUMIN 3.6 01/20/2019 0810   ALBUMIN 4.3 09/22/2013 1222   AST 17 01/20/2019 0810   AST 20 09/22/2013 1222   ALT 13 01/20/2019 0810   ALT 22 09/22/2013 1222   ALKPHOS 61 01/20/2019 0810   ALKPHOS 85 09/22/2013  1222   BILITOT 0.3 01/20/2019 0810   BILITOT 0.40 09/22/2013 1222   GFRNONAA >60 01/20/2019 0810   GFRAA >60 01/20/2019 0810         RADIOGRAPHY: Nm Pet Image Restag (ps) Skull Base To Thigh  Result Date: 01/14/2019 CLINICAL DATA:  Subsequent treatment strategy for diffuse B-cell lymphoma. EXAM: NUCLEAR MEDICINE PET SKULL BASE TO THIGH TECHNIQUE: 7.9 mCi F-18 FDG was injected intravenously. Full-ring PET imaging was performed from the skull base to thigh after the radiotracer. CT data was obtained and used for attenuation correction and anatomic localization. Fasting blood glucose: 92 mg/dl COMPARISON:  PET-CT dated 10/24/2018 FINDINGS: Mediastinal blood pool activity: SUV max 2.7 Liver activity: SUV max 3.8 NECK: No hypermetabolic cervical lymphadenopathy. Incidental CT findings: none CHEST: No hypermetabolic thoracic lymphadenopathy. No suspicious pulmonary nodules. Incidental CT findings: Right chest port terminating at the cavoatrial junction. Mild atherosclerotic calcifications of the aortic arch. Coronary atherosclerosis of the LAD and left circumflex. ABDOMEN/PELVIS: Two  progressive right inguinal nodes, including an inferior node measuring up to 2.2 cm short axis with max SUV 36.8 (series 4/image 181), previously 1.5 cm short axis with max SUV 9.7. Otherwise, no hypermetabolic abdominopelvic lymphadenopathy. No abnormal hypermetabolism in the liver, spleen, pancreas, or adrenal glands. Spleen is normal in size. Incidental CT findings: Prior cholecystectomy. 2.7 cm left upper pole renal cyst. Status post left hemicolectomy. Prior small bowel resection with suture line in the anterior lower pelvis. Left colonic diverticulosis, without evidence of diverticulitis. Atherosclerotic calcifications of the abdominal aorta and branch vessels. SKELETON: Prior diffuse osseous hypermetabolism is no longer evident. No focal hypermetabolic activity to suggest skeletal metastasis. Incidental CT findings:  Degenerative changes of the visualized thoracolumbar spine. IMPRESSION: Two progressive right inguinal nodes, as above (Deauville criteria 5). Otherwise, no suspicious lymphadenopathy in the neck, chest, abdomen, or pelvis. Spleen is normal in size. Electronically Signed   By: Julian Hy M.D.   On: 01/14/2019 09:31      IMPRESSION/PLAN: Recurrent stage IV non-Hodgkin's lymphoma, refractory in the right inguinal region.  I discussed this patient's case with Dr Irene Limbo.  He does not feel that she would tolerate more chemotherapy very well based on her age and overall status.  He and I agree that it is appropriate to pursue palliative radiotherapy to the right inguinal region which she would likely tolerate quite well.  If she progresses elsewhere in the body we can consider radiotherapy at other sites in the future.  I recommend a 2-week course focused on the hypermetabolic nodes in the right groin  Today, I talked to the patient about the findings and work-up thus far. We discussed the patient's diagnosis of progressive right inguinal lymphoma and general treatment for this, highlighting the role of radiotherapy in the management. We discussed the available radiation techniques, and focused on the details of logistics and delivery.    We discussed the risks, benefits, and side effects of radiotherapy. Side effects may include but not necessarily be limited to: Skin irritation, fatigue, nausea; no guarantees of treatment were given. The patient was encouraged to ask questions that I answered to the best of my ability. She would like to proceed.  We will schedule her for CT simulation in the near future.  This encounter was provided by telemedicine platform telephone as she couldn't access WebEx during pandemic precautions.  The patient has given verbal consent for this type of encounter and has been advised to only accept a meeting of this type in a secure network environment. The time spent  during this encounter was 20 minutes. The attendants for this meeting include Eppie Gibson  and [place].  During the encounter, Eppie Gibson was located at [facility] Radiation Oncology Department.  Taylor Huynh was located at home.     __________________________________________   Eppie Gibson, MD  This document serves as a record of services personally performed by Eppie Gibson, MD. It was created on her behalf by Rae Lips, a trained medical scribe. The creation of this record is based on the scribe's personal observations and the provider's statements to them. This document has been checked and approved by the attending provider.

## 2019-01-22 ENCOUNTER — Encounter: Payer: Self-pay | Admitting: Radiation Oncology

## 2019-01-27 ENCOUNTER — Other Ambulatory Visit: Payer: Self-pay

## 2019-01-27 ENCOUNTER — Ambulatory Visit
Admission: RE | Admit: 2019-01-27 | Discharge: 2019-01-27 | Disposition: A | Discharge status: Home / Self Care | Payer: Medicare Other | Source: Ambulatory Visit | Attending: Radiation Oncology | Admitting: Radiation Oncology

## 2019-01-27 DIAGNOSIS — Z510 Encounter for antineoplastic radiation therapy: Secondary | ICD-10-CM | POA: Insufficient documentation

## 2019-01-27 DIAGNOSIS — C8338 Diffuse large B-cell lymphoma, lymph nodes of multiple sites: Secondary | ICD-10-CM | POA: Insufficient documentation

## 2019-01-28 NOTE — Progress Notes (Signed)
  Radiation Oncology         [phone] ________________________________  Name: PRISHA HILEY MRN: [id number]  Date: 01/27/2019  DOB: 1940-10-26  SIMULATION AND TREATMENT PLANNING NOTE  Outpatient  DIAGNOSIS:     ICD-10-CM   1. 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  Identity was confirmed.  All relevant records and images related to the planned course of therapy were reviewed.  The patient freely provided informed written consent to proceed with treatment after reviewing the details related to the planned course of therapy. The consent form was witnessed and verified by the simulation staff.    Then, the patient was set-up in a stable reproducible  supine position for radiation therapy.  CT images were obtained.  Surface markings were placed.  The CT images were loaded into the planning software.    TREATMENT PLANNING NOTE: Treatment planning then occurred.  The radiation prescription was entered and confirmed.    A total of 3 medically necessary complex treatment devices were fabricated and supervised by me, in the form of 2 fields with MLCs to block genitalia and bladder, and Vaclock for immobilization. MORE FIELDS WITH MLCs MAY BE ADDED IN DOSIMETRY for dose homogeneity.  I have requested : 3D Simulation  I have requested a DVH of the following structures: bladder, genitalia, target.   The patient will receive 25 Gy in 10 fractions to the right inguinal adenopathy.   -----------------------------------  Eppie Gibson, MD

## 2019-01-29 DIAGNOSIS — C8338 Diffuse large B-cell lymphoma, lymph nodes of multiple sites: Secondary | ICD-10-CM | POA: Diagnosis not present

## 2019-02-03 ENCOUNTER — Ambulatory Visit
Admission: RE | Admit: 2019-02-03 | Discharge: 2019-02-03 | Disposition: A | Discharge status: Home / Self Care | Payer: Medicare Other | Source: Ambulatory Visit | Attending: Radiation Oncology | Admitting: Radiation Oncology

## 2019-02-03 ENCOUNTER — Other Ambulatory Visit: Payer: Self-pay

## 2019-02-03 DIAGNOSIS — C8338 Diffuse large B-cell lymphoma, lymph nodes of multiple sites: Secondary | ICD-10-CM | POA: Diagnosis not present

## 2019-02-04 ENCOUNTER — Ambulatory Visit
Admission: RE | Admit: 2019-02-04 | Discharge: 2019-02-04 | Disposition: A | Discharge status: Home / Self Care | Payer: Medicare Other | Source: Ambulatory Visit | Attending: Radiation Oncology | Admitting: Radiation Oncology

## 2019-02-04 ENCOUNTER — Other Ambulatory Visit: Payer: Self-pay

## 2019-02-04 DIAGNOSIS — C8338 Diffuse large B-cell lymphoma, lymph nodes of multiple sites: Secondary | ICD-10-CM | POA: Diagnosis not present

## 2019-02-05 ENCOUNTER — Other Ambulatory Visit: Payer: Self-pay

## 2019-02-05 ENCOUNTER — Ambulatory Visit
Admission: RE | Admit: 2019-02-05 | Discharge: 2019-02-05 | Disposition: A | Discharge status: Home / Self Care | Payer: Medicare Other | Source: Ambulatory Visit | Attending: Radiation Oncology | Admitting: Radiation Oncology

## 2019-02-05 DIAGNOSIS — C8338 Diffuse large B-cell lymphoma, lymph nodes of multiple sites: Secondary | ICD-10-CM | POA: Diagnosis not present

## 2019-02-06 ENCOUNTER — Other Ambulatory Visit: Payer: Self-pay

## 2019-02-06 ENCOUNTER — Ambulatory Visit
Admission: RE | Admit: 2019-02-06 | Discharge: 2019-02-06 | Disposition: A | Discharge status: Home / Self Care | Payer: Medicare Other | Source: Ambulatory Visit | Attending: Radiation Oncology | Admitting: Radiation Oncology

## 2019-02-06 DIAGNOSIS — C8338 Diffuse large B-cell lymphoma, lymph nodes of multiple sites: Secondary | ICD-10-CM | POA: Diagnosis not present

## 2019-02-07 ENCOUNTER — Ambulatory Visit
Admission: RE | Admit: 2019-02-07 | Discharge: 2019-02-07 | Disposition: A | Discharge status: Home / Self Care | Payer: Medicare Other | Source: Ambulatory Visit | Attending: Radiation Oncology | Admitting: Radiation Oncology

## 2019-02-07 ENCOUNTER — Other Ambulatory Visit: Payer: Self-pay

## 2019-02-07 DIAGNOSIS — C8338 Diffuse large B-cell lymphoma, lymph nodes of multiple sites: Secondary | ICD-10-CM | POA: Diagnosis not present

## 2019-02-10 ENCOUNTER — Ambulatory Visit
Admission: RE | Admit: 2019-02-10 | Discharge: 2019-02-10 | Disposition: A | Discharge status: Home / Self Care | Payer: Medicare Other | Source: Ambulatory Visit | Attending: Radiation Oncology | Admitting: Radiation Oncology

## 2019-02-10 ENCOUNTER — Other Ambulatory Visit: Payer: Self-pay

## 2019-02-10 DIAGNOSIS — C8338 Diffuse large B-cell lymphoma, lymph nodes of multiple sites: Secondary | ICD-10-CM | POA: Diagnosis not present

## 2019-02-11 ENCOUNTER — Ambulatory Visit
Admission: RE | Admit: 2019-02-11 | Discharge: 2019-02-11 | Disposition: A | Discharge status: Home / Self Care | Payer: Medicare Other | Source: Ambulatory Visit | Attending: Radiation Oncology | Admitting: Radiation Oncology

## 2019-02-11 ENCOUNTER — Other Ambulatory Visit: Payer: Self-pay

## 2019-02-11 DIAGNOSIS — C8338 Diffuse large B-cell lymphoma, lymph nodes of multiple sites: Secondary | ICD-10-CM | POA: Diagnosis not present

## 2019-02-12 ENCOUNTER — Other Ambulatory Visit: Payer: Self-pay

## 2019-02-12 ENCOUNTER — Ambulatory Visit
Admission: RE | Admit: 2019-02-12 | Discharge: 2019-02-12 | Disposition: A | Discharge status: Home / Self Care | Payer: Medicare Other | Source: Ambulatory Visit | Attending: Radiation Oncology | Admitting: Radiation Oncology

## 2019-02-12 DIAGNOSIS — C8338 Diffuse large B-cell lymphoma, lymph nodes of multiple sites: Secondary | ICD-10-CM | POA: Diagnosis not present

## 2019-02-13 ENCOUNTER — Ambulatory Visit
Admission: RE | Admit: 2019-02-13 | Discharge: 2019-02-13 | Disposition: A | Discharge status: Home / Self Care | Payer: Medicare Other | Source: Ambulatory Visit | Attending: Radiation Oncology | Admitting: Radiation Oncology

## 2019-02-13 ENCOUNTER — Other Ambulatory Visit: Payer: Self-pay

## 2019-02-13 DIAGNOSIS — C8338 Diffuse large B-cell lymphoma, lymph nodes of multiple sites: Secondary | ICD-10-CM | POA: Diagnosis not present

## 2019-02-14 ENCOUNTER — Ambulatory Visit
Admission: RE | Admit: 2019-02-14 | Discharge: 2019-02-14 | Disposition: A | Discharge status: Home / Self Care | Payer: Medicare Other | Source: Ambulatory Visit | Attending: Radiation Oncology | Admitting: Radiation Oncology

## 2019-02-14 ENCOUNTER — Encounter: Payer: Self-pay | Admitting: Radiation Oncology

## 2019-02-14 ENCOUNTER — Other Ambulatory Visit: Payer: Self-pay

## 2019-02-14 DIAGNOSIS — C8338 Diffuse large B-cell lymphoma, lymph nodes of multiple sites: Secondary | ICD-10-CM | POA: Diagnosis not present

## 2019-02-18 NOTE — Progress Notes (Signed)
HEMATOLOGY/ONCOLOGY CLINIC NOTE  Date of Service: 02/19/2019  Patient Care Team: [name], MD as PCP - General (Internal Medicine)  CHIEF COMPLAINTS/PURPOSE OF CONSULTATION:  Large B-Cell Lymphoma  Oncologic History:  Taylor Huynh presented with extranodal involvement in the terminal ileum s/p gross tumor resection in November 2010, and was diagnosed with High Grade B-Cell Non-Hodgkin's Lymphoma. She was treated with 4 cycles of R-CHOP between 07/05/09 and 09/06/09.  HISTORY OF PRESENTING ILLNESS:   Taylor Huynh is a wonderful 78 y.o. female who has been referred to [place] by Dr. Lawerance Cruel for evaluation and management of Large B-Cell Lymphoma. She is accompanied today by her daugher. The pt reports that she is doing well overall.  The pt reports that she found a lump in her right groin just before 07/17/18. She was evaluated by her PCP on 07/18/18 with a CT A/P, as noted below. She denies this lump changing much since she first noticed it, and endorses some soreness. The pt denies fevers, chills, night sweats or unexpected weight loss. Prior to this, the patient denies any changes in her quality of life or ability to function. She notes that her right leg became swollen yesterday as well. She denies changes in her bowel movements, abdominal pains, and left leg swelling. She denies any problems passing urine nor blood in the urine. She denies heart problems or recent chest pain or SOB. She denies changes in vision or headaches.   The pt has been eating well and endorses good appetite.   The pt takes Amlodipine every other day. She takes 71m Prilosec every day, as well as Vitamin B12 replacement. She denies stroke history, lung problems, nor heart problems. She notes a history of minor seizures, which she hasn't had since she began B12 replacement several years ago. The pt has never smoked cigarettes. She notes that she does not need help with any daily  activities and lives with her husband who also functions independently. She attends a local gym regularly.   Of note prior to the patient's visit today, pt has had a CT A/P completed on 07/18/18 with results revealing Extensive abdominal-pelvic adenopathy, consistent with recurrent lymphoma. 2. Tiny hiatal hernia. 3. Pelvic floor laxity. Aortic Atherosclerosis.  Most recent lab results (08/06/18) of CBC is as follows: all values are WNL except for RBC at 3.75, HGB at 10.6, HCT at 34.0  On review of systems, pt reports sore right inguinal lump, right leg swelling, good energy levels, sore right thigh, and denies changes in vision, headaches, abdominal pains, changes in bowel habits, problems passing urine, blood in the urine, left leg swelling, fevers, chills, night sweats, noticing other lumps or bumps, back pain, pain along the spine, calf pain, and any other symptoms.   On PMHx the pt reports High Grade B-Cell Non-Hodgkin's lymphoma in 2010, HTN, Vitamin B12 deficiency, minor seizures, GERD. On Social Hx the pt denies chemical exposure.  Interval History:   Taylor HERONreturns today for management and evaluation of her Recurrent/New High Grade B-Cell Non-Hodgkin's Lymphoma. The patient's last visit with [place] on 01/20/2019. The pt reports that she is doing well overall.  The pt reports that her radiation went well and did not cause fatigue. She sleeps well with a sleeping pill. Denies leg swelling, belly pain, skin irritation, breathing changes. She reports eating well.  Lab results today (02/19/2019) of CBC w/diff and CMP is as follows: all values are WNL except for WBC at 2.1,  RBC at 3.43, HGB at 9.9, HCT at 31.4, neutro abs at 1.2, lymphs abs at 0.6, Calcium at 8.8, total protein at 6.0, AST at 14.  On review of systems, pt reports eating well, and denies weight changes, fever, chills, night sweats, fatigue, leg swelling, belly pain, skin irritation, breathing changes and any other  symptoms.   MEDICAL HISTORY:  Past Medical History:  Diagnosis Date   GERD (gastroesophageal reflux disease)    HH (hiatus hernia) 12/12/2011   Hypertension    Leukopenia 12/12/2011   nhl dx'd 2010   Seizures ([place]) reports "years ago"   Syncope 12/12/2011   Hx seizures - not documented - age 70; recent syncope 6/12 and x2, 12/12; negative MRI brain 1/13    SURGICAL HISTORY: Past Surgical History:  Procedure Laterality Date   COLON SURGERY  2010   due to cancer   IR IMAGING GUIDED PORT INSERTION  08/23/2018    SOCIAL HISTORY: Social History   Socioeconomic History   Marital status: Married    Spouse name: Not on file   Number of children: Not on file   Years of education: Not on file   Highest education level: Not on file  Occupational History   Not on file  Social Needs   Financial resource strain: Not on file   Food insecurity    Worry: Not on file    Inability: Not on file   Transportation needs    Medical: No    Non-medical: No  Tobacco Use   Smoking status: Never Smoker   Smokeless tobacco: Never Used  Substance and Sexual Activity   Alcohol use: No   Drug use: No   Sexual activity: Not on file  Lifestyle   Physical activity    Days per week: Not on file    Minutes per session: Not on file   Stress: Not on file  Relationships   Social connections    Talks on phone: Not on file    Gets together: Not on file    Attends religious service: Not on file    Active member of club or organization: Not on file    Attends meetings of clubs or organizations: Not on file    Relationship status: Not on file   Intimate partner violence    Fear of current or ex partner: No    Emotionally abused: No    Physically abused: No    Forced sexual activity: No  Other Topics Concern   Not on file  Social History Narrative   Not on file    FAMILY HISTORY: No family history on file.  ALLERGIES:  has No Known Allergies.  MEDICATIONS:    Current Outpatient Medications  Medication Sig Dispense Refill   amLODipine (NORVASC) 10 MG tablet Take 10 mg by mouth every other day.      cholecalciferol (VITAMIN D) 1000 UNITS tablet Take 1,000 Units by mouth daily.       lidocaine-prilocaine (EMLA) cream Apply 1 application topically as needed. (Patient taking differently: Apply 1 application topically as needed (port access). ) 30 g 1   Melatonin 5 MG TABS Take 5 mg by mouth at bedtime.      omeprazole (PRILOSEC) 20 MG capsule Take 20 mg by mouth daily.     potassium chloride SA (K-DUR,KLOR-CON) 20 MEQ tablet Take 1 tablet (20 mEq total) by mouth 2 (two) times daily. (Patient not taking: Reported on 01/21/2019) 30 tablet 0   vitamin B-12 (CYANOCOBALAMIN) 1000 MCG  tablet Take 1,000 mcg by mouth every other day.     No current facility-administered medications for this visit.     REVIEW OF SYSTEMS:    A 10+ POINT REVIEW OF SYSTEMS WAS OBTAINED including neurology, dermatology, psychiatry, cardiac, respiratory, lymph, extremities, GI, GU, Musculoskeletal, constitutional, breasts, reproductive, HEENT.  All pertinent positives are noted in the HPI.  All others are negative.   PHYSICAL EXAMINATION: ECOG PERFORMANCE STATUS: 1 - Symptomatic but completely ambulatory  Vitals:   02/19/19 1002  BP: (!) 149/78  Pulse: 73  Resp: 18  Temp: 98.2 F (36.8 C)  SpO2: 100%   Filed Weights   02/19/19 1002  Weight: 158 lb (71.7 kg)   .Body mass index is 27.12 kg/m.   GENERAL:alert, in no acute distress and comfortable SKIN: no acute rashes, no significant lesions EYES: conjunctiva are pink and non-injected, sclera anicteric OROPHARYNX: MMM, no exudates, no oropharyngeal erythema or ulceration NECK: supple, no JVD LYMPH:  no palpable lymphadenopathy in the cervical, axillary or inguinal regions LUNGS: clear to auscultation b/l with normal respiratory effort HEART: regular rate & rhythm ABDOMEN:  normoactive bowel sounds , non  tender, not distended. No palpable hepatosplenomegaly.  Extremity: no pedal edema PSYCH: alert & oriented x 3 with fluent speech NEURO: no focal motor/sensory deficits   LABORATORY DATA:  I have reviewed the data as listed  . CBC Latest Ref Rng & Units 02/19/2019 01/20/2019 12/09/2018  WBC 4.0 - 10.5 K/uL 2.1(L) 3.4(L) 8.3  Hemoglobin 12.0 - 15.0 g/dL 9.9(L) 10.1(L) 9.0(L)  Hematocrit 36.0 - 46.0 % 31.4(L) 32.1(L) 29.7(L)  Platelets 150 - 400 K/uL 180 204 157    . CMP Latest Ref Rng & Units 02/19/2019 01/20/2019 12/09/2018  Glucose 70 - 99 mg/dL 86 98 104(H)  BUN 8 - 23 mg/dL '19 17 9  ' Creatinine 0.44 - 1.00 mg/dL 0.80 0.80 1.01(H)  Sodium 135 - 145 mmol/L 142 141 142  Potassium 3.5 - 5.1 mmol/L 3.6 3.7 3.4(L)  Chloride 98 - 111 mmol/L 111 110 108  CO2 22 - 32 mmol/L '25 24 23  ' Calcium 8.9 - 10.3 mg/dL 8.8(L) 8.6(L) 8.1(L)  Total Protein 6.5 - 8.1 g/dL 6.0(L) 6.2(L) 6.0(L)  Total Bilirubin 0.3 - 1.2 mg/dL 0.3 0.3 0.2(L)  Alkaline Phos 38 - 126 U/L 61 61 66  AST 15 - 41 U/L 14(L) 17 11(L)  ALT 0 - 44 U/L '10 13 12   ' 08/06/18 Biopsy:    06/01/09 Pathology:     07/18/18 CBC w/diff:    RADIOGRAPHIC STUDIES: I have personally reviewed the radiological images as listed and agreed with the findings in the report.  08/14/18 US Venous RLE:   No results found.  ASSESSMENT & PLAN:   78 y.o. female with  1. 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 Newly Diagnosed Recurrent/New High Grade B-Cell Non-Hodgkin's Lymphoma, Stage IV FISH negative for findings of double hit  lymphoma  07/18/18 CT A/P revealed Extensive abdominal-pelvic adenopathy, consistent with recurrent lymphoma. 2. Tiny hiatal hernia. 3. Pelvic floor laxity. Aortic Atherosclerosis.  Labs upon initial presentation from1/14/20, WBC normal at 4.3k, HGB at 10.6, PLT normal at 201k  08/06/18  Right Inguinal LN Biopsy which revealed Large B-Cell Lymphoma with a Ki67 of 90%. FISH is negative for rearrangements of BCL2, BCL6, and MYC. 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  08/13/18 Hep B and Hep C negative  08/21/18 ECHO which revealed LV EF of 50-55%  08/22/18 PET/CT revealedProgression, since 50/53/9767, of hypermetabolic abdominopelvic adenopathy, consistent with active lymphoma. 2. CT occult hypermetabolic osseous foci, also most consistent with active lymphoma. 3. No evidence of soft tissue disease above the diaphragm. 4. Focus of hypermetabolism within the anus is likely physiologic. Consider physical exam correlation. 5. Aortic Atherosclerosis.  10/24/18 PET/CT revealed Marked response to therapy of abdominopelvic lymphoma. Residual right pelvic hypermetabolic nodes. (Deauville 5). 2. No new sites of disease. 3. Marrow hypermetabolism is likely due to stimulation by chemotherapy. 4. Coronary artery atherosclerosis. Aortic Atherosclerosis.   S/p 5 cycles of EPOCH-R completed on 11/29/18, dose reduced for age and previous chemotherapy exposure. Discussed considerations of C6 EPOCH-R and the indications to do so. Pt strongly prefers to cease treatment now, after completing 5 cycles of EPOCH-R.   01/14/19 PET/CT revealed "Two progressive right inguinal nodes, as above (Deauville criteria 5). Otherwise, no suspicious lymphadenopathy in the neck, chest, abdomen, or pelvis. Spleen is normal in size."  3. RLE swelling r/o DVT. Could be from lymphedema related to her new/recurrent NHL 1/21/10US Venous RLE to rule out DVT - reviewed - no VTE  4.  Hypokalemia.resolved- likely from steroids.  5. Pancytopenia due to chemotherapy and DLBCL  6. 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 No evidence of bleeding  7. DVT Px - lovenox [place]  PLAN: -Discussed pt labwork today, 02/19/2019; blood chemistries are steady - mild anemia, mild leukopenia - likely from RT -Pt has completed her RT for the 2 remaining lymph nodes. -Miralax and Senokot as needed for constipation. -Recommended that the pt continue to eat well, drink at least 48-64 oz of water each day, and walk 20-30 minutes each day.  -Plan to repeat PET scan in 3 months and then remove port. If scans are clear, will monitor with follow ups. -Will see the pt back in 3 months for repeat PET and labs   PET/CT in 12 weeks  Portflush, labs and MD visit in 3 months   All of the patients questions were answered with apparent satisfaction. The patient knows to call the clinic with any problems, questions or concerns.  The total time spent in the appt was 15 minutes and more than 50% was on counseling and direct patient cares.  Sullivan Lone MD MS AAHIVMS [facility] Hematology/Oncology Physician [facility]  (Office):       [phone] (Work cell):  [phone] (Fax):           [phone]  02/19/2019 10:22 AM  I, De Burrs, am acting as a scribe for Dr. Irene Limbo  .I have reviewed the above documentation for accuracy and completeness, and I agree with the above. Brunetta Genera MD

## 2019-02-19 ENCOUNTER — Inpatient Hospital Stay: Payer: Medicare Other

## 2019-02-19 ENCOUNTER — Other Ambulatory Visit: Payer: Self-pay

## 2019-02-19 ENCOUNTER — Telehealth: Payer: Self-pay | Admitting: Hematology

## 2019-02-19 ENCOUNTER — Inpatient Hospital Stay: Payer: Medicare Other | Attending: Hematology | Admitting: Hematology

## 2019-02-19 VITALS — BP 149/78 | HR 73 | Temp 98.2°F | Resp 18 | Ht 64.0 in | Wt 158.0 lb

## 2019-02-19 DIAGNOSIS — D61810 Antineoplastic chemotherapy induced pancytopenia: Secondary | ICD-10-CM | POA: Diagnosis not present

## 2019-02-19 DIAGNOSIS — K449 Diaphragmatic hernia without obstruction or gangrene: Secondary | ICD-10-CM | POA: Insufficient documentation

## 2019-02-19 DIAGNOSIS — D72819 Decreased white blood cell count, unspecified: Secondary | ICD-10-CM | POA: Diagnosis not present

## 2019-02-19 DIAGNOSIS — Z9221 Personal history of antineoplastic chemotherapy: Secondary | ICD-10-CM | POA: Diagnosis not present

## 2019-02-19 DIAGNOSIS — K219 Gastro-esophageal reflux disease without esophagitis: Secondary | ICD-10-CM | POA: Insufficient documentation

## 2019-02-19 DIAGNOSIS — I700 Atherosclerosis of aorta: Secondary | ICD-10-CM | POA: Diagnosis not present

## 2019-02-19 DIAGNOSIS — D630 Anemia in neoplastic disease: Secondary | ICD-10-CM | POA: Insufficient documentation

## 2019-02-19 DIAGNOSIS — T451X5A Adverse effect of antineoplastic and immunosuppressive drugs, initial encounter: Secondary | ICD-10-CM | POA: Diagnosis not present

## 2019-02-19 DIAGNOSIS — Z79899 Other long term (current) drug therapy: Secondary | ICD-10-CM | POA: Diagnosis not present

## 2019-02-19 DIAGNOSIS — Z923 Personal history of irradiation: Secondary | ICD-10-CM

## 2019-02-19 DIAGNOSIS — G40909 Epilepsy, unspecified, not intractable, without status epilepticus: Secondary | ICD-10-CM | POA: Insufficient documentation

## 2019-02-19 DIAGNOSIS — Z86718 Personal history of other venous thrombosis and embolism: Secondary | ICD-10-CM | POA: Diagnosis not present

## 2019-02-19 DIAGNOSIS — C8330 Diffuse large B-cell lymphoma, unspecified site: Secondary | ICD-10-CM | POA: Diagnosis not present

## 2019-02-19 DIAGNOSIS — Z95828 Presence of other vascular implants and grafts: Secondary | ICD-10-CM

## 2019-02-19 DIAGNOSIS — I10 Essential (primary) hypertension: Secondary | ICD-10-CM | POA: Insufficient documentation

## 2019-02-19 DIAGNOSIS — C8338 Diffuse large B-cell lymphoma, lymph nodes of multiple sites: Secondary | ICD-10-CM | POA: Diagnosis present

## 2019-02-19 DIAGNOSIS — D6481 Anemia due to antineoplastic chemotherapy: Secondary | ICD-10-CM | POA: Insufficient documentation

## 2019-02-19 LAB — CBC WITH DIFFERENTIAL/PLATELET
Abs Immature Granulocytes: 0 10*3/uL (ref 0.00–0.07)
Basophils Absolute: 0 10*3/uL (ref 0.0–0.1)
Basophils Relative: 1 %
Eosinophils Absolute: 0.1 10*3/uL (ref 0.0–0.5)
Eosinophils Relative: 4 %
HCT: 31.4 % — ABNORMAL LOW (ref 36.0–46.0)
Hemoglobin: 9.9 g/dL — ABNORMAL LOW (ref 12.0–15.0)
Immature Granulocytes: 0 %
Lymphocytes Relative: 27 %
Lymphs Abs: 0.6 10*3/uL — ABNORMAL LOW (ref 0.7–4.0)
MCH: 28.9 pg (ref 26.0–34.0)
MCHC: 31.5 g/dL (ref 30.0–36.0)
MCV: 91.5 fL (ref 80.0–100.0)
Monocytes Absolute: 0.3 10*3/uL (ref 0.1–1.0)
Monocytes Relative: 14 %
Neutro Abs: 1.2 10*3/uL — ABNORMAL LOW (ref 1.7–7.7)
Neutrophils Relative %: 54 %
Platelets: 180 10*3/uL (ref 150–400)
RBC: 3.43 MIL/uL — ABNORMAL LOW (ref 3.87–5.11)
RDW: 13.1 % (ref 11.5–15.5)
WBC: 2.1 10*3/uL — ABNORMAL LOW (ref 4.0–10.5)
nRBC: 0 % (ref 0.0–0.2)

## 2019-02-19 LAB — CMP (CANCER CENTER ONLY)
ALT: 10 U/L (ref 0–44)
AST: 14 U/L — ABNORMAL LOW (ref 15–41)
Albumin: 3.6 g/dL (ref 3.5–5.0)
Alkaline Phosphatase: 61 U/L (ref 38–126)
Anion gap: 6 (ref 5–15)
BUN: 19 mg/dL (ref 8–23)
CO2: 25 mmol/L (ref 22–32)
Calcium: 8.8 mg/dL — ABNORMAL LOW (ref 8.9–10.3)
Chloride: 111 mmol/L (ref 98–111)
Creatinine: 0.8 mg/dL (ref 0.44–1.00)
GFR, Est AFR Am: >60 mL/min (ref >60)
GFR, Estimated: >60 mL/min (ref >60)
Glucose, Bld: 86 mg/dL (ref 70–99)
Potassium: 3.6 mmol/L (ref 3.5–5.1)
Sodium: 142 mmol/L (ref 135–145)
Total Bilirubin: 0.3 mg/dL (ref 0.3–1.2)
Total Protein: 6 g/dL — ABNORMAL LOW (ref 6.5–8.1)

## 2019-02-19 MED ORDER — SODIUM CHLORIDE 0.9% FLUSH
10.0000 mL | INTRAVENOUS | Status: DC | PRN
  Administered 2019-02-19: 10 mL via INTRAVENOUS
  Filled 2019-02-19: qty 10

## 2019-02-19 MED ORDER — HEPARIN SOD (PORK) LOCK FLUSH 100 UNIT/ML IV SOLN
500.0000 [IU] | Freq: Once | INTRAVENOUS | Status: AC
  Administered 2019-02-19: 500 [IU] via INTRAVENOUS
  Filled 2019-02-19: qty 5

## 2019-02-19 MED ORDER — HEPARIN SOD (PORK) LOCK FLUSH 100 UNIT/ML IV SOLN
500.0000 [IU] | Freq: Once | INTRAVENOUS | Status: DC
  Filled 2019-02-19: qty 5

## 2019-02-19 NOTE — Telephone Encounter (Signed)
Scheduled appt per 7/29 los. ° °Printed and mailed appt calendar. °

## 2019-02-19 NOTE — Patient Instructions (Signed)

## 2019-03-17 ENCOUNTER — Telehealth: Payer: Self-pay | Admitting: *Deleted

## 2019-03-17 NOTE — Telephone Encounter (Signed)
Contacted patient with Dr. Grier Mitts response: The plan was to remove it after looking at her f/u pet to make sure she is in remission. If she wants to have it removed now there is a possibility that further treatment would need the port replaced. Gave patient information as per Dr. Irene Limbo. Patient verbalized understanding. Advised patient if port site becomes reddened/swollen/has discharge to contact office ASAP. Can use a small bandage to cover site if catching on clothes,changing daily.

## 2019-03-17 NOTE — Telephone Encounter (Signed)
Patient called -- Wants to know if she can have her port removed. "It's catching on her clothes and she's seen some little spots on her clothing from it". Denies redness/drainage. Next appt is 10/29 and she isn't sure she wants to wait that long to have it removed.

## 2019-04-04 ENCOUNTER — Encounter: Payer: Self-pay | Admitting: Radiation Oncology

## 2019-04-04 NOTE — Progress Notes (Signed)
  Patient Name: Taylor Huynh MRN: [id number] DOB: January 01, 1941 Referring Physician: Sullivan Lone (Profile Not Attached) Date of Service: 02/14/2019 [facility], [place]                                                        End Of Treatment Note  Diagnoses: C83.38-Diffuse large b-cell lymphoma, lymph nodes of multiple sites  Cancer Staging: Stage IV NHL  Intent: Palliative  Radiation Treatment Dates: 02/03/2019 through 02/14/2019 Site Technique Total Dose Dose per Fx Completed Fx Beam Energies  Pelvis: Pelvis_Rt_groin 3D 25/25 Gy 2.5 10/10 6X   Narrative: The patient tolerated radiation therapy relatively well.    Plan: The patient will follow-up with radiation oncology PRN, continue with medical oncology. -----------------------------------  Eppie Gibson, MD

## 2019-04-14 ENCOUNTER — Telehealth: Payer: Self-pay | Admitting: *Deleted

## 2019-04-14 NOTE — Telephone Encounter (Signed)
Patient called -- her port is tender & sore to touch and has a scab over it. Last infusion was in May and last flush was in July. She has appt to see Dr. Irene Limbo and have flush w/labs on 10/29. She has asked if she needs to have it looked at before her next appt. Dr. Irene Limbo given information and question. Per Dr. Irene Limbo, ask patient to come to [facility] tomorrow 9/22. Patient in agreement. Schedule message sent.

## 2019-04-15 ENCOUNTER — Other Ambulatory Visit: Payer: Self-pay

## 2019-04-15 ENCOUNTER — Inpatient Hospital Stay: Payer: Medicare Other | Attending: Hematology | Admitting: Medical

## 2019-04-15 VITALS — BP 146/90 | HR 71 | Temp 98.2°F | Resp 18 | Ht 64.0 in | Wt 161.6 lb

## 2019-04-15 DIAGNOSIS — I10 Essential (primary) hypertension: Secondary | ICD-10-CM | POA: Insufficient documentation

## 2019-04-15 DIAGNOSIS — C8338 Diffuse large B-cell lymphoma, lymph nodes of multiple sites: Secondary | ICD-10-CM | POA: Diagnosis present

## 2019-04-15 DIAGNOSIS — K449 Diaphragmatic hernia without obstruction or gangrene: Secondary | ICD-10-CM | POA: Insufficient documentation

## 2019-04-15 DIAGNOSIS — D72819 Decreased white blood cell count, unspecified: Secondary | ICD-10-CM | POA: Insufficient documentation

## 2019-04-15 DIAGNOSIS — G40909 Epilepsy, unspecified, not intractable, without status epilepticus: Secondary | ICD-10-CM | POA: Insufficient documentation

## 2019-04-15 DIAGNOSIS — Z87891 Personal history of nicotine dependence: Secondary | ICD-10-CM | POA: Diagnosis not present

## 2019-04-15 DIAGNOSIS — K219 Gastro-esophageal reflux disease without esophagitis: Secondary | ICD-10-CM | POA: Diagnosis not present

## 2019-04-15 MED ORDER — SULFAMETHOXAZOLE-TRIMETHOPRIM 800-160 MG PO TABS: 1.0000 | ORAL_TABLET | Freq: Two times a day (BID) | ORAL | 0 refills | Status: DC

## 2019-04-15 NOTE — Progress Notes (Signed)
Pt presents with reported soreness/itching on R side of chest around implanted port and "what looks like a scab".  States that she started having nonpurulent drainage from the site a month ago and was told "to clean it and put a bandage on it and watch it for any changes" when she called the [facility].  Bottom third of implanted port visible and outside of chest wall, rest of port still secured with no signs of dislodgement.  Denies trouble breathing or swallowing or fever/chills at home.  Afebrile.  VSS.  No redness, edema, bleeding, drainage/pus, streaking, or other injury visible at site.  PA Lucianne Lei & MD [facility] aware.  Pt's port cleaned with chloro scrub, sterile gauze and dressing applied.  Pt advised to not shower with dressing on and not to remove the dressing until she is seen by IR for port removal on 04/17/2019.  Pt verbalized understanding of this and of d/c instructions to f/u as needed and to monitor for signs of infection until then.

## 2019-04-15 NOTE — Patient Instructions (Signed)
COVID-19: How to Protect Yourself and Others Know how it spreads  There is currently no vaccine to prevent coronavirus disease 2019 (COVID-19).  The best way to prevent illness is to avoid being exposed to this virus.  The virus is thought to spread mainly from person-to-person. ? Between people who are in close contact with one another (within about 6 feet). ? Through respiratory droplets produced when an infected person coughs, sneezes or talks. ? These droplets can land in the mouths or noses of people who are nearby or possibly be inhaled into the lungs. ? Some recent studies have suggested that COVID-19 may be spread by people who are not showing symptoms. Everyone should Clean your hands often  Wash your hands often with soap and water for at least 20 seconds especially after you have been in a public place, or after blowing your nose, coughing, or sneezing.  If soap and water are not readily available, use a hand sanitizer that contains at least 60% alcohol. Cover all surfaces of your hands and rub them together until they feel dry.  Avoid touching your eyes, nose, and mouth with unwashed hands. Avoid close contact  Stay home if you are sick.  Avoid close contact with people who are sick.  Put distance between yourself and other people. ? Remember that some people without symptoms may be able to spread virus. ? This is especially important for people who are at higher risk of getting very sick.www.cdc.gov/coronavirus/2019-ncov/need-extra-precautions/people-at-higher-risk.html Cover your mouth and nose with a cloth face cover when around others  You could spread COVID-19 to others even if you do not feel sick.  Everyone should wear a cloth face cover when they have to go out in public, for example to the grocery store or to pick up other necessities. ? Cloth face coverings should not be placed on young children under age 2, anyone who has trouble breathing, or is unconscious,  incapacitated or otherwise unable to remove the mask without assistance.  The cloth face cover is meant to protect other people in case you are infected.  Do NOT use a facemask meant for a healthcare worker.  Continue to keep about 6 feet between yourself and others. The cloth face cover is not a substitute for social distancing. Cover coughs and sneezes  If you are in a private setting and do not have on your cloth face covering, remember to always cover your mouth and nose with a tissue when you cough or sneeze or use the inside of your elbow.  Throw used tissues in the trash.  Immediately wash your hands with soap and water for at least 20 seconds. If soap and water are not readily available, clean your hands with a hand sanitizer that contains at least 60% alcohol. Clean and disinfect  Clean AND disinfect frequently touched surfaces daily. This includes tables, doorknobs, light switches, countertops, handles, desks, phones, keyboards, toilets, faucets, and sinks. www.cdc.gov/coronavirus/2019-ncov/prevent-getting-sick/disinfecting-your-home.html  If surfaces are dirty, clean them: Use detergent or soap and water prior to disinfection.  Then, use a household disinfectant. You can see a list of EPA-registered household disinfectants here. cdc.gov/coronavirus 11/26/2018 This information is not intended to replace advice given to you by your health care provider. Make sure you discuss any questions you have with your health care provider. Document Released: 11/05/2018 Document Revised: 12/04/2018 Document Reviewed: 11/05/2018 Elsevier Patient Education  2020 [employer]  

## 2019-04-15 NOTE — Progress Notes (Signed)
[facility] Progress Note   Taylor Huynh [id number] 1941/04/25 78 y.o.  [place] D Taylor Huynh is managed by Dr. Sullivan Lone  Actively treated with chemotherapy/immunotherapy/hormonal therapy: no  Next scheduled appointment with provider: 05/22/2019  Assessment: Plan:    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[place]) - Plan: IR Removal Tun Access W/ Port W/O [place]  Please see After Visit Summary for patient specific instructions.  Future Appointments  Date Time Provider [place]  05/22/2019  9:45 AM [facility] 4 [facility] None  05/22/2019 10:00 AM [facility] [place] None  05/22/2019 10:40 AM Kale, Cloria [place], MD [facility] None    Orders Placed This Encounter  Procedures  . IR Removal Tun Access W/ Port W/O [place]       Subjective:   Patient ID:  Taylor Huynh is a 78 y.o. (DOB [birth year]) female.  Chief Complaint: No chief complaint on file.   HPI [name]  Is a 78 y.o. 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 She is followed by Dr. Sullivan Lone. 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 She was then treated with 5 cycles of EPOCH-R completed on 11/29/18. The patient deferred the reccommened 6th cycle of EPOCH-R. She noted a scab at the site of her right chest wall port-a-cath approximately 1 month ago. She reports that she contacted  Our office and was told to bandage the area and to keep an eye on it.  She presents to the clinic today stating that she has had episodic drainage from the area and has had increased pain at the site.  She also reports that the area where she initially had a scab has gotten larger.  She denies fevers, chills, or sweats.  Medications: I have reviewed the patient's current medications.  Allergies: No Known  Allergies  Past Medical History:  Diagnosis Date  . GERD (gastroesophageal reflux disease)   . HH (hiatus hernia) 12/12/2011  . Hypertension   . Leukopenia 12/12/2011  . nhl dx'd 2010  . Seizures ([place]) reports "years ago"  . Syncope 12/12/2011   Hx seizures - not documented - age 66; recent syncope 6/12 and x2, 12/12; negative MRI brain 1/13    Past Surgical History:  Procedure Laterality Date  . COLON SURGERY  2010   due to cancer  . IR IMAGING GUIDED PORT INSERTION  08/23/2018    No family history on file.  Social History   Socioeconomic History  . Marital status: Married    Spouse name: Not on file  . Number of children: Not on file  . Years of education: Not on file  . Highest education level: Not on file  Occupational History  . Not on file  Social Needs  . Financial resource strain: Not on file  . Food insecurity    Worry: Not on file    Inability: Not on file  . Transportation needs    Medical: No    Non-medical: No  Tobacco Use  . Smoking status: Never Smoker  . Smokeless tobacco: Never Used  Substance and Sexual Activity  . Alcohol use: No  . Drug use: No  . Sexual activity: Not on file  Lifestyle  . Physical activity    Days per week: Not on file    Minutes per session: Not on file  .  Stress: Not on file  Relationships  . Social Herbalist on phone: Not on file    Gets together: Not on file    Attends religious service: Not on file    Active member of club or organization: Not on file    Attends meetings of clubs or organizations: Not on file    Relationship status: Not on file  . Intimate partner violence    Fear of current or ex partner: No    Emotionally abused: No    Physically abused: No    Forced sexual activity: No  Other Topics Concern  . Not on file  Social History Narrative  . Not on file    Past Medical History, Surgical history, Social history, and Family history were reviewed and updated as appropriate.   Please  see review of systems for further details on the patient's review from today.   Review of Systems:  Review of Systems  Constitutional: Negative for chills, diaphoresis and fever.  HENT: Negative for trouble swallowing and voice change.   Respiratory: Negative for cough, chest tightness, shortness of breath and wheezing.   Cardiovascular: Negative for chest pain and palpitations.  Gastrointestinal: Negative for abdominal pain, constipation, diarrhea, nausea and vomiting.  Musculoskeletal: Negative for back pain and myalgias.  Skin:       Enlarging and tender lesion at the site of a right chest wall Port-A-Cath.  Neurological: Negative for dizziness, light-headedness and headaches.    Objective:   Physical Exam:  BP (!) 146/90 (BP Location: Right Arm, Patient Position: Sitting) Comment: Retake notified nurse of elevated BP  Pulse 71   Temp 98.2 F (36.8 C) (Temporal)   Resp 18   Ht 5\' 4"  (1.626 m)   Wt 161 lb 9.6 oz (73.3 kg)   LMP  (LMP Unknown)   SpO2 100%   BMI 27.74 kg/m  ECOG: 0  Physical Exam Constitutional:      General: She is not in acute distress.    Appearance: Normal appearance. She is not ill-appearing or diaphoretic.  HENT:     Head: Normocephalic and atraumatic.  Skin:    General: Skin is warm and dry.     Findings: Erythema present.     Comments: A PowerPort is noted through a open wound in the right chest wall.  There is slight edema along the inferior edge of the wound.  Neurological:     Mental Status: She is alert.     Gait: Gait normal.  Psychiatric:        Mood and Affect: Mood normal.        Behavior: Behavior normal.        Thought Content: Thought content normal.        Lab Review:     Component Value Date/Time   NA 142 02/19/2019 0930   NA 140 09/22/2013 1222   K 3.6 02/19/2019 0930   K 3.5 09/22/2013 1222   CL 111 02/19/2019 0930   CL 102 12/05/2011 0900   CO2 25 02/19/2019 0930   CO2 27 09/22/2013 1222   GLUCOSE 86 02/19/2019  0930   GLUCOSE 91 09/22/2013 1222   GLUCOSE 94 12/05/2011 0900   BUN 19 02/19/2019 0930   BUN 13.5 09/22/2013 1222   CREATININE 0.80 02/19/2019 0930   CREATININE 0.8 09/22/2013 1222   CALCIUM 8.8 (L) 02/19/2019 0930   CALCIUM 9.5 09/22/2013 1222   PROT 6.0 (L) 02/19/2019 0930   PROT 7.6 09/22/2013 1222  ALBUMIN 3.6 02/19/2019 0930   ALBUMIN 4.3 09/22/2013 1222   AST 14 (L) 02/19/2019 0930   AST 20 09/22/2013 1222   ALT 10 02/19/2019 0930   ALT 22 09/22/2013 1222   ALKPHOS 61 02/19/2019 0930   ALKPHOS 85 09/22/2013 1222   BILITOT 0.3 02/19/2019 0930   BILITOT 0.40 09/22/2013 1222   GFRNONAA >60 02/19/2019 0930   GFRAA >60 02/19/2019 0930       Component Value Date/Time   WBC 2.1 (L) 02/19/2019 0930   RBC 3.43 (L) 02/19/2019 0930   HGB 9.9 (L) 02/19/2019 0930   HGB 9.4 (L) 09/09/2018 0803   HGB 12.1 09/22/2013 1222   HCT 31.4 (L) 02/19/2019 0930   HCT 37.3 09/22/2013 1222   PLT 180 02/19/2019 0930   PLT 101 (L) 09/09/2018 0803   PLT 248 09/22/2013 1222   MCV 91.5 02/19/2019 0930   MCV 88.0 09/22/2013 1222   MCH 28.9 02/19/2019 0930   MCHC 31.5 02/19/2019 0930   RDW 13.1 02/19/2019 0930   RDW 13.6 09/22/2013 1222   LYMPHSABS 0.6 (L) 02/19/2019 0930   LYMPHSABS 1.4 09/22/2013 1222   MONOABS 0.3 02/19/2019 0930   MONOABS 0.3 09/22/2013 1222   EOSABS 0.1 02/19/2019 0930   EOSABS 0.1 09/22/2013 1222   BASOSABS 0.0 02/19/2019 0930   BASOSABS 0.0 09/22/2013 1222   -------------------------------  Imaging from last 24 hours (if applicable):  Radiology interpretation: No results found.       This case was discussed with Dr. Irene Limbo. He expresses agreement with my management of this patient.

## 2019-04-17 ENCOUNTER — Other Ambulatory Visit: Payer: Self-pay | Admitting: Radiology

## 2019-04-18 ENCOUNTER — Ambulatory Visit (HOSPITAL_COMMUNITY)
Admission: RE | Admit: 2019-04-18 | Discharge: 2019-04-18 | Disposition: A | Discharge status: Home / Self Care | Payer: Medicare Other | Source: Ambulatory Visit | Attending: Medical | Admitting: Medical

## 2019-04-18 ENCOUNTER — Encounter (HOSPITAL_COMMUNITY): Payer: Self-pay

## 2019-04-18 ENCOUNTER — Other Ambulatory Visit: Payer: Self-pay | Admitting: Medical

## 2019-04-18 ENCOUNTER — Other Ambulatory Visit: Payer: Self-pay

## 2019-04-18 DIAGNOSIS — C8338 Diffuse large B-cell lymphoma, lymph nodes of multiple sites: Secondary | ICD-10-CM | POA: Insufficient documentation

## 2019-04-18 DIAGNOSIS — Z452 Encounter for adjustment and management of vascular access device: Secondary | ICD-10-CM | POA: Insufficient documentation

## 2019-04-18 HISTORY — PX: IR REMOVAL TUN ACCESS W/ PORT W/O FL MOD SED: IMG2290

## 2019-04-18 LAB — CBC
HCT: 35.9 % — ABNORMAL LOW (ref 36.0–46.0)
Hemoglobin: 11.1 g/dL — ABNORMAL LOW (ref 12.0–15.0)
MCH: 28.5 pg (ref 26.0–34.0)
MCHC: 30.9 g/dL (ref 30.0–36.0)
MCV: 92.1 fL (ref 80.0–100.0)
Platelets: 211 K/uL (ref 150–400)
RBC: 3.9 MIL/uL (ref 3.87–5.11)
RDW: 13.3 % (ref 11.5–15.5)
WBC: 2.4 K/uL — ABNORMAL LOW (ref 4.0–10.5)
nRBC: 0 % (ref 0.0–0.2)

## 2019-04-18 LAB — BASIC METABOLIC PANEL WITH GFR
Anion gap: 9 (ref 5–15)
BUN: 21 mg/dL (ref 8–23)
CO2: 25 mmol/L (ref 22–32)
Calcium: 9.1 mg/dL (ref 8.9–10.3)
Chloride: 107 mmol/L (ref 98–111)
Creatinine, Ser: 0.93 mg/dL (ref 0.44–1.00)
GFR calc Af Amer: >60 mL/min
GFR calc non Af Amer: 58 mL/min — ABNORMAL LOW
Glucose, Bld: 92 mg/dL (ref 70–99)
Potassium: 4.4 mmol/L (ref 3.5–5.1)
Sodium: 141 mmol/L (ref 135–145)

## 2019-04-18 LAB — PROTIME-INR
INR: 1 (ref 0.8–1.2)
Prothrombin Time: 12.6 s (ref 11.4–15.2)

## 2019-04-18 MED ORDER — SODIUM CHLORIDE 0.9 % IV SOLN
INTRAVENOUS | Status: DC
  Administered 2019-04-18: 12:00:00 via INTRAVENOUS

## 2019-04-18 MED ORDER — CEFAZOLIN SODIUM-DEXTROSE 2-4 GM/100ML-% IV SOLN
INTRAVENOUS | Status: AC
  Administered 2019-04-18: 2 g via INTRAVENOUS
  Filled 2019-04-18: qty 100

## 2019-04-18 MED ORDER — LIDOCAINE-EPINEPHRINE 1 %-1:100000 IJ SOLN
INTRAMUSCULAR | Status: AC
  Filled 2019-04-18: qty 1

## 2019-04-18 MED ORDER — CEFAZOLIN SODIUM-DEXTROSE 2-4 GM/100ML-% IV SOLN
2.0000 g | INTRAVENOUS | Status: AC
  Administered 2019-04-18: 12:00:00 2 g via INTRAVENOUS

## 2019-04-18 MED ORDER — LIDOCAINE HCL 1 % IJ SOLN
INTRAMUSCULAR | Status: AC | PRN
  Administered 2019-04-18 (×2): 5 mL

## 2019-04-18 NOTE — Progress Notes (Signed)
Patient ID: Taylor Huynh, female   DOB: 01-Jan-1941, 78 y.o.   MRN: [id number] Pt presents to IR dept today for port a cath removal; she has a hx of DLBCL and has completed treatment. Her port a cath is eroding through skin surface. She is currently on bactrim. Details/risks of procedure, incl but not limited to internal bleeding, infection, injury to adjacent structures d/w pt with her understanding and consent. She does not wish to receive IV conscious sedation for procedure. She is afebrile. LABS PENDING.

## 2019-04-18 NOTE — Discharge Instructions (Signed)
Implanted Port Removal, Care After °This sheet gives you information about how to care for yourself after your procedure. Your health care provider may also give you more specific instructions. If you have problems or questions, contact your health care provider. °What can I expect after the procedure? °After the procedure, it is common to have: °· Soreness or pain near your incision. °· Some swelling or bruising near your incision. °Follow these instructions at home: °Medicines °· Take over-the-counter and prescription medicines only as told by your health care provider. °· If you were prescribed an antibiotic medicine, take it as told by your health care provider. Do not stop taking the antibiotic even if you start to feel better. °Bathing °· Do not take baths, swim, or use a hot tub until your health care provider approves. Ask your health care provider if you can take showers. You may only be allowed to take sponge baths. °Incision care ° °· Follow instructions from your health care provider about how to take care of your incision. Make sure you: °? Wash your hands with soap and water before you change your bandage (dressing). If soap and water are not available, use hand sanitizer. °? Change your dressing as told by your health care provider. °? Keep your dressing dry. °? Leave stitches (sutures), skin glue, or adhesive strips in place. These skin closures may need to stay in place for 2 weeks or longer. If adhesive strip edges start to loosen and curl up, you may trim the loose edges. Do not remove adhesive strips completely unless your health care provider tells you to do that. °· Check your incision area every day for signs of infection. Check for: °? More redness, swelling, or pain. °? More fluid or blood. °? Warmth. °? Pus or a bad smell. °Driving ° °· Do not drive for 24 hours if you were given a medicine to help you relax (sedative) during your procedure. °· If you did not receive a sedative, ask your  health care provider when it is safe to drive. °Activity °· Return to your normal activities as told by your health care provider. Ask your health care provider what activities are safe for you. °· Do not lift anything that is heavier than 10 lb (4.5 kg), or the limit that you are told, until your health care provider says that it is safe. °· Do not do activities that involve lifting your arms over your head. °General instructions °· Do not use any products that contain nicotine or tobacco, such as cigarettes and e-cigarettes. These can delay healing. If you need help quitting, ask your health care provider. °· Keep all follow-up visits as told by your health care provider. This is important. °Contact a health care provider if: °· You have more redness, swelling, or pain around your incision. °· You have more fluid or blood coming from your incision. °· Your incision feels warm to the touch. °· You have pus or a bad smell coming from your incision. °· You have pain that is not relieved by your pain medicine. °Get help right away if you have: °· A fever or chills. °· Chest pain. °· Difficulty breathing. °Summary °· After the procedure, it is common to have pain, soreness, swelling, or bruising near your incision. °· If you were prescribed an antibiotic medicine, take it as told by your health care provider. Do not stop taking the antibiotic even if you start to feel better. °· Do not drive for 24 hours   if you were given a sedative during your procedure. °· Return to your normal activities as told by your health care provider. Ask your health care provider what activities are safe for you. °This information is not intended to replace advice given to you by your health care provider. Make sure you discuss any questions you have with your health care provider. °Document Released: 06/21/2015 Document Revised: 08/23/2017 Document Reviewed: 08/23/2017 °Elsevier Patient Education © 2020 [employer] ° °

## 2019-04-18 NOTE — Procedures (Signed)
  Procedure: R IJ port removal   EBL:   minimal Complications:  none immediate  See full dictation in [facility].  Dillard Cannon MD Main # [phone] Pager  [phone]

## 2019-04-22 ENCOUNTER — Other Ambulatory Visit: Payer: Self-pay

## 2019-04-22 ENCOUNTER — Other Ambulatory Visit: Payer: Self-pay | Admitting: Medical

## 2019-04-22 ENCOUNTER — Ambulatory Visit (HOSPITAL_COMMUNITY)
Admission: RE | Admit: 2019-04-22 | Discharge: 2019-04-22 | Disposition: A | Discharge status: Home / Self Care | Payer: Medicare Other | Source: Ambulatory Visit | Attending: Medical | Admitting: Medical

## 2019-04-22 DIAGNOSIS — C8338 Diffuse large B-cell lymphoma, lymph nodes of multiple sites: Secondary | ICD-10-CM

## 2019-04-22 HISTORY — PX: IR PATIENT EVAL TECH 0-60 MINS: IMG5564

## 2019-04-22 NOTE — Progress Notes (Signed)
Patient ID: Taylor Huynh, female   DOB: 12-10-1940, 78 y.o.   MRN: [id number] Pt presented to IR today for port site dressing change, s/p port removal for skin erosion on 04/18/19 . The existing iodoform gauze was removed and replaced with wet-to-dry gauze dressing.  Patient reports no significant fever, worsening chest pain, or drainage from port pocket.  She is currently on Bactrim.  She will follow-up with dressing change on 10/2.

## 2019-04-22 NOTE — Procedures (Signed)
Pt came in today for port site dressing change.  Rowe Robert PAc evaluated the pocket after the existing iodoform dressing was removed and the site cleaned with betadine.  The site looked good with no signs of infection, redness or pain. A new  wet-to-dry gauze dressing was applied.   She will return for another dressing change in 3 days.  The patient was advised to call if she has any concerns before her appointment.

## 2019-04-24 ENCOUNTER — Other Ambulatory Visit (HOSPITAL_COMMUNITY): Payer: Self-pay | Admitting: Radiology

## 2019-04-24 ENCOUNTER — Encounter (HOSPITAL_COMMUNITY): Payer: Self-pay | Admitting: *Deleted

## 2019-04-24 DIAGNOSIS — T80212A Local infection due to central venous catheter, initial encounter: Secondary | ICD-10-CM

## 2019-04-24 NOTE — Procedures (Signed)
Pt came in today for port site dressing change.  Rowe Robert PAc evaluated the pocket after the existing iodoform dressing was removed and the site cleaned with betadine.  The site looked good with no signs of infection, redness or pain. A new  wet-to-dry gauze dressing was applied.  She will return for another dressing change in 3 days.  The patient was advised to call if she has any concerns before her appointment.

## 2019-04-25 ENCOUNTER — Ambulatory Visit (HOSPITAL_COMMUNITY)
Admission: RE | Admit: 2019-04-25 | Discharge: 2019-04-25 | Disposition: A | Discharge status: Home / Self Care | Payer: Medicare Other | Source: Ambulatory Visit | Attending: Radiology | Admitting: Radiology

## 2019-04-25 ENCOUNTER — Encounter: Payer: Self-pay | Admitting: Radiology

## 2019-04-25 ENCOUNTER — Other Ambulatory Visit (HOSPITAL_COMMUNITY): Payer: Self-pay | Admitting: Radiology

## 2019-04-25 ENCOUNTER — Other Ambulatory Visit: Payer: Self-pay

## 2019-04-25 DIAGNOSIS — T80212A Local infection due to central venous catheter, initial encounter: Secondary | ICD-10-CM

## 2019-04-25 DIAGNOSIS — T80219A Unspecified infection due to central venous catheter, initial encounter: Secondary | ICD-10-CM

## 2019-04-25 HISTORY — PX: IR PATIENT EVAL TECH 0-60 MINS: IMG5564

## 2019-04-25 NOTE — Procedures (Signed)
Ptcame intoday for port site dressing change. Rowe Robert PAc evaluated the pocket. The site looked good with no signs of infection, redness or pain. A new wet-to-dry gauze dressing was applied.She willreturn for anotherdressing change in 3 days.The patient was advised to call if she has any concerns before her appointment.

## 2019-04-28 ENCOUNTER — Other Ambulatory Visit (HOSPITAL_COMMUNITY): Payer: Self-pay | Admitting: Student

## 2019-04-28 ENCOUNTER — Ambulatory Visit (HOSPITAL_COMMUNITY)
Admission: RE | Admit: 2019-04-28 | Discharge: 2019-04-28 | Disposition: A | Discharge status: Home / Self Care | Payer: Medicare Other | Source: Ambulatory Visit | Attending: Radiology | Admitting: Radiology

## 2019-04-28 ENCOUNTER — Other Ambulatory Visit: Payer: Self-pay

## 2019-04-28 ENCOUNTER — Encounter (HOSPITAL_COMMUNITY): Payer: Self-pay | Admitting: *Deleted

## 2019-04-28 DIAGNOSIS — Z4800 Encounter for change or removal of nonsurgical wound dressing: Secondary | ICD-10-CM | POA: Diagnosis present

## 2019-04-28 DIAGNOSIS — T80212D Local infection due to central venous catheter, subsequent encounter: Secondary | ICD-10-CM

## 2019-04-28 DIAGNOSIS — T80219A Unspecified infection due to central venous catheter, initial encounter: Secondary | ICD-10-CM

## 2019-04-28 HISTORY — PX: IR PATIENT EVAL TECH 0-60 MINS: IMG5564

## 2019-04-28 NOTE — Procedures (Signed)
Ptcame intoday for port site dressing change. Taylor Huynh [facility] evaluated the pocket. The site looked good with no signs of infection, redness or pain. A new wet-to-dry gauze dressing was applied.She willreturn for anotherdressing change in 3 days.The patient was advised to call if she has any concerns before her appointment.

## 2019-05-02 ENCOUNTER — Ambulatory Visit (HOSPITAL_COMMUNITY)
Admission: RE | Admit: 2019-05-02 | Discharge: 2019-05-02 | Disposition: A | Discharge status: Home / Self Care | Payer: Medicare Other | Source: Ambulatory Visit | Attending: Student | Admitting: Student

## 2019-05-02 ENCOUNTER — Encounter: Payer: Self-pay | Admitting: Radiology

## 2019-05-02 ENCOUNTER — Other Ambulatory Visit: Payer: Self-pay

## 2019-05-02 DIAGNOSIS — T80212D Local infection due to central venous catheter, subsequent encounter: Secondary | ICD-10-CM

## 2019-05-02 HISTORY — PX: IR PATIENT EVAL TECH 0-60 MINS: IMG5564

## 2019-05-02 NOTE — Procedures (Signed)
Ptcame intoday for follow up port site dressing change 04-28-2019. Rowe Robert PAC evaluated the site after the packing had been removed.The site looked good with no signs of infection, redness or pain.  No further wet to dyr dressings are needed.  A new surface dressing with bacitracin ointment was applied.  Patient was advised to change the dressing immediately if it gets wet or Monday if it does not.  She should change the dressing every other day and apply a thin layer of vasoline to the gauze.  She can change to a bandaid on Friday 05/09/2019 if the wound starts to scab over.  Discontinue vasoline at that time. The patient was advised to call if she has any concerns.

## 2019-05-08 ENCOUNTER — Emergency Department (HOSPITAL_COMMUNITY): Payer: Medicare Other

## 2019-05-08 ENCOUNTER — Other Ambulatory Visit: Payer: Self-pay

## 2019-05-08 ENCOUNTER — Encounter (HOSPITAL_COMMUNITY): Payer: Self-pay

## 2019-05-08 ENCOUNTER — Emergency Department (HOSPITAL_COMMUNITY)
Admission: EM | Admit: 2019-05-08 | Discharge: 2019-05-08 | Disposition: A | Discharge status: Home / Self Care | Payer: Medicare Other | Attending: Emergency Medicine | Admitting: Emergency Medicine

## 2019-05-08 DIAGNOSIS — S6991XA Unspecified injury of right wrist, hand and finger(s), initial encounter: Secondary | ICD-10-CM

## 2019-05-08 DIAGNOSIS — W010XXA Fall on same level from slipping, tripping and stumbling without subsequent striking against object, initial encounter: Secondary | ICD-10-CM | POA: Insufficient documentation

## 2019-05-08 DIAGNOSIS — Y92009 Unspecified place in unspecified non-institutional (private) residence as the place of occurrence of the external cause: Secondary | ICD-10-CM

## 2019-05-08 DIAGNOSIS — Z79899 Other long term (current) drug therapy: Secondary | ICD-10-CM | POA: Diagnosis not present

## 2019-05-08 DIAGNOSIS — I10 Essential (primary) hypertension: Secondary | ICD-10-CM | POA: Diagnosis not present

## 2019-05-08 DIAGNOSIS — W19XXXA Unspecified fall, initial encounter: Secondary | ICD-10-CM

## 2019-05-08 DIAGNOSIS — Y9301 Activity, walking, marching and hiking: Secondary | ICD-10-CM | POA: Insufficient documentation

## 2019-05-08 DIAGNOSIS — Y92019 Unspecified place in single-family (private) house as the place of occurrence of the external cause: Secondary | ICD-10-CM | POA: Insufficient documentation

## 2019-05-08 DIAGNOSIS — Y999 Unspecified external cause status: Secondary | ICD-10-CM | POA: Insufficient documentation

## 2019-05-08 NOTE — ED Triage Notes (Signed)
Patient states she did not pick her feet up and tripped on a rug 3 days ago. Patient states she hurt her right wrist and hit her head on a tile floor. Patient denies LOC or taking blood thinners. Patient denies any N/V.

## 2019-05-08 NOTE — ED Provider Notes (Signed)
[facility] DEPT Provider Note   CSN: [id number] Arrival date & time: 05/08/19  1055     History   Chief Complaint Chief Complaint  Patient presents with   Fall   Hand Injury    HPI Taylor Huynh is a 78 y.o. female.     HPI 78 year old African-American female with a past medical history significant for hypertension, lymphoma who presents to the emergency department today for evaluation of right hand pain.  Patient reports that 3 days ago she tripped on a rug in her house falling to the ground.  She tried to catch herself with her hand and did hit her head.  Patient denies any LOC.  Denies taking any blood thinners.  Denies any headache, vision changes, lightheadedness or dizziness.  Patient states that she has continued pain to her right hand.  She called her primary care doctor and they were not able to get her in after the accident.  She is concerned that she may have broken her hand.  Patient has been taking Tylenol for her pain with some relief.  She also reports some swelling to the right hand.  Patient denies any chest pain, neck pain, back pain, abdominal pain from the fall.  Has been ambulatory since the event.  Pain is worse with palpation or range of motion of the right hand. 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   Hypokalemia    Anemia    Hypocalcemia    Hypomagnesemia    Encounter for antineoplastic chemotherapy    Diffuse large B cell lymphoma ([place]) 08/29/2018   Counseling regarding advance care planning and  goals of care 08/29/2018   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   Hx of diverticulitis of colon 12/12/2011   Leukopenia 12/12/2011   Syncope 12/12/2011   HH (hiatus hernia) 12/12/2011   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      Home Medications    Prior to Admission medications   Medication Sig Start Date End Date Taking? Authorizing Provider  amLODipine (NORVASC) 10 MG tablet Take 10 mg by mouth every other day.  11/02/11   [provider]  cholecalciferol (VITAMIN D) 1000 UNITS tablet Take 1,000 Units by mouth daily.      [provider]  lidocaine-prilocaine (EMLA) cream Apply 1 application topically as needed. 08/28/18   Brunetta Genera, MD  Melatonin 5 MG TABS  Take 5 mg by mouth at bedtime.     [provider]  omeprazole (PRILOSEC) 20 MG capsule Take 20 mg by mouth daily. 09/30/18   [provider]  sulfamethoxazole-trimethoprim (BACTRIM DS) 800-160 MG tablet Take 1 tablet by mouth 2 (two) times daily. 04/15/19   Tanner, Lyndon Code., PA-C  vitamin B-12 (CYANOCOBALAMIN) 1000 MCG tablet Take 1,000 mcg by mouth every other day.    [provider]    Family History Family History  Problem Relation Age of Onset   Diabetes Mother    Diabetes Father     Social History Social History   Tobacco Use   Smoking status: Never Smoker   Smokeless tobacco: Never Used  Substance Use Topics   Alcohol use: No    Drug use: No     Allergies   Patient has no known allergies.   Review of Systems Review of Systems  Constitutional: Negative for chills.  HENT: Negative for congestion.   Eyes: Negative for discharge.  Respiratory: Negative for cough.   Cardiovascular: Negative for chest pain.  Gastrointestinal: Negative for abdominal pain.  Musculoskeletal: Positive for arthralgias. Negative for back pain and neck pain.  Skin: Negative for color change.  Neurological: Negative for syncope, weakness, numbness and headaches.  Psychiatric/Behavioral: Negative for confusion.     Physical Exam Updated Vital Signs BP (!) 143/86 (BP Location: Left Arm)    Pulse 74    Temp 97.9 F (36.6 C) (Oral)    Resp 16    Ht 5\' 4"  (1.626 m)    Wt 73 kg    LMP  (LMP Unknown)    SpO2 100%    BMI 27.64 kg/m   Physical Exam Vitals signs and nursing note reviewed.  Constitutional:      General: She is not in acute distress.    Appearance: She is well-developed. She is not ill-appearing or toxic-appearing.  HENT:     Head: Normocephalic and atraumatic.     Comments: No skull depression.  No hemotympanum.  No septal hematoma.  No skull hematoma.  No battle signs or raccoon eyes.    Right Ear: Tympanic membrane, ear canal and external ear normal.     Left Ear: Tympanic membrane, ear canal and external ear normal.     Nose: Nose normal.     Mouth/Throat:     Mouth: Mucous membranes are moist.     Pharynx: Oropharynx is clear.  Eyes:     General: No scleral icterus.       Right eye: No discharge.        Left eye: No discharge.     Extraocular Movements: Extraocular movements intact.     Conjunctiva/sclera: Conjunctivae normal.     Pupils: Pupils are equal, round, and reactive to light.  Neck:     Musculoskeletal: Normal range of motion and neck supple.     Comments: No midline C-spine tenderness. Pulmonary:     Effort: No respiratory distress.  Abdominal:     General: Abdomen is flat.     Palpations:  Abdomen is soft.     Tenderness: There is no abdominal tenderness. There is no guarding.  Musculoskeletal: Normal range of motion.     Comments: No midline L-spine or T-spine tenderness.  Patient with some mild pain to palpation of the right scaphoid region of the right hand.  She does have full range of motion of the right wrist.  Tender to palpation over the distal radius as well.  She has good opposition of the thumb.  She does have a chronic deformity to her right pinky finger.  Capillary refill is normal.  Radial pulses 2+ bilaterally.  Sensation is intact.  Skin:    General: Skin is warm and dry.     Capillary Refill: Capillary refill takes less than 2 seconds.     Coloration: Skin is not pale.  Neurological:     Mental Status: She is alert and oriented to person, place, and time.     Comments: The patient is alert, attentive, and oriented x 3. Speech is clear. Cranial nerve II-VII grossly intact. Negative pronator drift. Sensation intact. Strength 5/5 in all extremities. Reflexes 2+ and symmetric at biceps, triceps, knees, and ankles. Rapid alternating movement and fine finger movements intact. Posture and gait normal.   Psychiatric:        Behavior: Behavior normal.        Thought Content: Thought content normal.        Judgment: Judgment normal.      ED Treatments / Results  Labs (all labs ordered are listed, but only abnormal results are displayed) Labs Reviewed - No data to display  EKG None  Radiology Dg Wrist Complete Right  Result Date: 05/08/2019 CLINICAL DATA:  Patient states she did not pick her feet up and tripped on a rug 3 days ago. Patient states she hurt her right wrist and hit her head on a tile floor. EXAM: RIGHT WRIST - COMPLETE 3+ VIEW COMPARISON:  None. FINDINGS: Mild joint space narrowing in the wrist. Well corticated bone fragment identified adjacent to the ulnar styloid consistent with old injury or degenerative changes. No acute fracture or  subluxation. IMPRESSION: No evidence for acute abnormality. Electronically Signed   By: Nolon Nations M.D.   On: 05/08/2019 11:44    Procedures Procedures (including critical care time)  Medications Ordered in ED Medications - No data to display   Initial Impression / Assessment and Plan / ED Course  I have reviewed the triage vital signs and the nursing notes.  Pertinent labs & imaging results that were available during my care of the patient were reviewed by me and considered in my medical decision making (see chart for details).        78 year old female presents the ER for evaluation of right hand pain after mechanical fall 3 days ago.  Patient does report hitting her head.  Denies take any blood thinners.  Denies any headache, vision changes, syncope, lightheadedness or dizziness.  She states that she really came to the ER for her hand pain.  Exam is reassuring.  Patient has no focal neurological deficit.  No signs of intracranial, intrathoracic or intra-abdominal trauma.  Patient is neurovascularly intact to the right upper extremity.  No obvious deformity was noted.  X-rays were obtained that showed no evidence of fracture or malalignment.  Patient does have some pain of the scaphoid region.  Will place patient and splint and follow-up with orthopedic doctor and her primary care doctor.  Discussed rice therapy at home.  Patient did hit her head during the fall but is not on any blood thinners.  Denies any LOC.  Patient has no signs of significant trauma to her head.  I offered CT scan however have low yield that this is going to show any intracranial traumatic injury.  Patient states that she would decline at this time and will follow-up as needed.  I feel this is reasonable given the fall was  3 days ago and patient has no complaints of headache or any other intracranial findings.  Pt is hemodynamically stable, in NAD, & able to ambulate in the ED. Evaluation does not show pathology  that would require ongoing emergent intervention or inpatient treatment. I explained the diagnosis to the patient. Pain has been managed & has no complaints prior to dc. Pt is comfortable with above plan and is stable for discharge at this time. All questions were answered prior to disposition. Strict return precautions for f/u to the ED were discussed. Encouraged follow up with PCP.   Final Clinical Impressions(s) / ED Diagnoses   Final diagnoses:  Fall in home, initial encounter  Injury of right hand, initial encounter    ED Discharge Orders    None       Aaron Edelman 05/08/19 1209    Veryl Speak, MD 05/08/19 1534

## 2019-05-08 NOTE — ED Notes (Addendum)
PA at bedside.

## 2019-05-08 NOTE — Discharge Instructions (Addendum)
The x-ray of your hand not show any fractures.  This is likely a sprain.  Have given you a splint for your wrist.  Rest, ice, elevate the area.  Continue Motrin and Tylenol for pain.  Please wear this at all time until you follow-up with orthopedic doctor.  You can return to the ER if you develop any worsening symptoms.

## 2019-05-14 ENCOUNTER — Other Ambulatory Visit: Payer: Self-pay

## 2019-05-14 ENCOUNTER — Encounter (HOSPITAL_COMMUNITY)
Admission: RE | Admit: 2019-05-14 | Discharge: 2019-05-14 | Disposition: A | Discharge status: Home / Self Care | Payer: Medicare Other | Source: Ambulatory Visit | Attending: Hematology | Admitting: Hematology

## 2019-05-14 DIAGNOSIS — Z79899 Other long term (current) drug therapy: Secondary | ICD-10-CM | POA: Insufficient documentation

## 2019-05-14 DIAGNOSIS — C8330 Diffuse large B-cell lymphoma, unspecified site: Secondary | ICD-10-CM | POA: Diagnosis present

## 2019-05-14 DIAGNOSIS — I10 Essential (primary) hypertension: Secondary | ICD-10-CM | POA: Diagnosis not present

## 2019-05-14 LAB — GLUCOSE, CAPILLARY: Glucose-Capillary: 95 mg/dL (ref 70–99)

## 2019-05-14 MED ORDER — FLUDEOXYGLUCOSE F - 18 (FDG) INJECTION
7.9800 | Freq: Once | INTRAVENOUS | Status: AC | PRN
  Administered 2019-05-14: 7.98 via INTRAVENOUS

## 2019-05-22 ENCOUNTER — Other Ambulatory Visit: Payer: Medicare Other

## 2019-05-22 ENCOUNTER — Inpatient Hospital Stay: Payer: Medicare Other | Attending: Hematology

## 2019-05-22 ENCOUNTER — Inpatient Hospital Stay (HOSPITAL_BASED_OUTPATIENT_CLINIC_OR_DEPARTMENT_OTHER): Payer: Medicare Other | Admitting: Hematology

## 2019-05-22 ENCOUNTER — Other Ambulatory Visit: Payer: Self-pay

## 2019-05-22 VITALS — BP 150/76 | HR 78 | Temp 97.8°F | Resp 18 | Ht 64.0 in | Wt 167.1 lb

## 2019-05-22 DIAGNOSIS — I700 Atherosclerosis of aorta: Secondary | ICD-10-CM | POA: Insufficient documentation

## 2019-05-22 DIAGNOSIS — Z8572 Personal history of non-Hodgkin lymphomas: Secondary | ICD-10-CM | POA: Insufficient documentation

## 2019-05-22 DIAGNOSIS — M7989 Other specified soft tissue disorders: Secondary | ICD-10-CM | POA: Diagnosis not present

## 2019-05-22 DIAGNOSIS — D72819 Decreased white blood cell count, unspecified: Secondary | ICD-10-CM | POA: Insufficient documentation

## 2019-05-22 DIAGNOSIS — K449 Diaphragmatic hernia without obstruction or gangrene: Secondary | ICD-10-CM | POA: Insufficient documentation

## 2019-05-22 DIAGNOSIS — C8330 Diffuse large B-cell lymphoma, unspecified site: Secondary | ICD-10-CM

## 2019-05-22 DIAGNOSIS — Z86718 Personal history of other venous thrombosis and embolism: Secondary | ICD-10-CM | POA: Insufficient documentation

## 2019-05-22 DIAGNOSIS — Z79899 Other long term (current) drug therapy: Secondary | ICD-10-CM | POA: Insufficient documentation

## 2019-05-22 DIAGNOSIS — I7090 Unspecified atherosclerosis: Secondary | ICD-10-CM | POA: Diagnosis not present

## 2019-05-22 DIAGNOSIS — I10 Essential (primary) hypertension: Secondary | ICD-10-CM | POA: Insufficient documentation

## 2019-05-22 DIAGNOSIS — K219 Gastro-esophageal reflux disease without esophagitis: Secondary | ICD-10-CM | POA: Insufficient documentation

## 2019-05-22 LAB — CMP (CANCER CENTER ONLY)
ALT: 12 U/L (ref 0–44)
AST: 14 U/L — ABNORMAL LOW (ref 15–41)
Albumin: 3.8 g/dL (ref 3.5–5.0)
Alkaline Phosphatase: 73 U/L (ref 38–126)
Anion gap: 8 (ref 5–15)
BUN: 19 mg/dL (ref 8–23)
CO2: 25 mmol/L (ref 22–32)
Calcium: 8.6 mg/dL — ABNORMAL LOW (ref 8.9–10.3)
Chloride: 108 mmol/L (ref 98–111)
Creatinine: 0.79 mg/dL (ref 0.44–1.00)
GFR, Est AFR Am: >60 mL/min (ref >60)
GFR, Estimated: >60 mL/min (ref >60)
Glucose, Bld: 91 mg/dL (ref 70–99)
Potassium: 4.1 mmol/L (ref 3.5–5.1)
Sodium: 141 mmol/L (ref 135–145)
Total Bilirubin: <0.2 mg/dL — ABNORMAL LOW (ref 0.3–1.2)
Total Protein: 6.4 g/dL — ABNORMAL LOW (ref 6.5–8.1)

## 2019-05-22 LAB — CBC WITH DIFFERENTIAL/PLATELET
Abs Immature Granulocytes: 0 10*3/uL (ref 0.00–0.07)
Basophils Absolute: 0 10*3/uL (ref 0.0–0.1)
Basophils Relative: 0 %
Eosinophils Absolute: 0.1 10*3/uL (ref 0.0–0.5)
Eosinophils Relative: 5 %
HCT: 32.5 % — ABNORMAL LOW (ref 36.0–46.0)
Hemoglobin: 10.4 g/dL — ABNORMAL LOW (ref 12.0–15.0)
Immature Granulocytes: 0 %
Lymphocytes Relative: 30 %
Lymphs Abs: 0.7 10*3/uL (ref 0.7–4.0)
MCH: 28.8 pg (ref 26.0–34.0)
MCHC: 32 g/dL (ref 30.0–36.0)
MCV: 90 fL (ref 80.0–100.0)
Monocytes Absolute: 0.3 10*3/uL (ref 0.1–1.0)
Monocytes Relative: 14 %
Neutro Abs: 1.2 10*3/uL — ABNORMAL LOW (ref 1.7–7.7)
Neutrophils Relative %: 51 %
Platelets: 247 10*3/uL (ref 150–400)
RBC: 3.61 MIL/uL — ABNORMAL LOW (ref 3.87–5.11)
RDW: 13.9 % (ref 11.5–15.5)
WBC: 2.3 10*3/uL — ABNORMAL LOW (ref 4.0–10.5)
nRBC: 0 % (ref 0.0–0.2)

## 2019-05-22 LAB — LACTATE DEHYDROGENASE: LDH: 210 U/L — ABNORMAL HIGH (ref 98–192)

## 2019-05-22 NOTE — Progress Notes (Signed)
HEMATOLOGY/ONCOLOGY CLINIC NOTE  Date of Service: 05/22/2019  Patient Care Team: [name], MD as PCP - General (Internal Medicine)  CHIEF COMPLAINTS/PURPOSE OF CONSULTATION:  Large B-Cell Lymphoma  Oncologic History:  Taylor Huynh presented with extranodal involvement in the terminal ileum s/p gross tumor resection in November 2010, and was diagnosed with High Grade B-Cell Non-Hodgkin's Lymphoma. She was treated with 4 cycles of R-CHOP between 07/05/09 and 09/06/09.  HISTORY OF PRESENTING ILLNESS:   Taylor Huynh is a wonderful 78 y.o. female who has been referred to [place] by Dr. Lawerance Cruel for evaluation and management of Large B-Cell Lymphoma. She is accompanied today by her daugher. The pt reports that she is doing well overall.  The pt reports that she found a lump in her right groin just before 07/17/18. She was evaluated by her PCP on 07/18/18 with a CT A/P, as noted below. She denies this lump changing much since she first noticed it, and endorses some soreness. The pt denies fevers, chills, night sweats or unexpected weight loss. Prior to this, the patient denies any changes in her quality of life or ability to function. She notes that her right leg became swollen yesterday as well. She denies changes in her bowel movements, abdominal pains, and left leg swelling. She denies any problems passing urine nor blood in the urine. She denies heart problems or recent chest pain or SOB. She denies changes in vision or headaches.   The pt has been eating well and endorses good appetite.   The pt takes Amlodipine every other day. She takes 63m Prilosec every day, as well as Vitamin B12 replacement. She denies stroke history, lung problems, nor heart problems. She notes a history of minor seizures, which she hasn't had since she began B12 replacement several years ago. The pt has never smoked cigarettes. She notes that she does not need help with any daily  activities and lives with her husband who also functions independently. She attends a local gym regularly.   Of note prior to the patient's visit today, pt has had a CT A/P completed on 07/18/18 with results revealing Extensive abdominal-pelvic adenopathy, consistent with recurrent lymphoma. 2. Tiny hiatal hernia. 3. Pelvic floor laxity. Aortic Atherosclerosis.  Most recent lab results (08/06/18) of CBC is as follows: all values are WNL except for RBC at 3.75, HGB at 10.6, HCT at 34.0  On review of systems, pt reports sore right inguinal lump, right leg swelling, good energy levels, sore right thigh, and denies changes in vision, headaches, abdominal pains, changes in bowel habits, problems passing urine, blood in the urine, left leg swelling, fevers, chills, night sweats, noticing other lumps or bumps, back pain, pain along the spine, calf pain, and any other symptoms.   On PMHx the pt reports High Grade B-Cell Non-Hodgkin's lymphoma in 2010, HTN, Vitamin B12 deficiency, minor seizures, GERD. On Social Hx the pt denies chemical exposure.  Interval History:   Taylor DOWEreturns today for management and evaluation of her Recurrent/High Grade B-Cell Non-Hodgkin's Lymphoma. The patient's last visit with [place] on 02/19/2019. The pt reports that she is doing well overall.  The pt reports that her port has been removed. She states that she is doing well. She skipped over a rug and fell flat and hurt her right wrist   She does not want to start chemotherapy or have a transplant.  Of note since the patient's last visit, pt has had NUCLEAR MEDICINE PET  SKULL BASE TO THIGH (Accession [id number]) completed on 05/14/2019 with results revealing Progressive bilateral pelvic nodes, including a new left internal iliac node which is markedly FDG avid. Deauville criteria 5. Prior right inguinal lymphadenopathy is improved. Spleen is normal in size.  Lab results today (05/22/19) of CBC w/diff and CMP  is as follows: all values are WNL except for WBC at 2.3, RBC at 3.61, Hemoglobin at 10.4, HCT at 32.5, Neutro Abs at 1.2, Calcium at 8.6, Total Protein at 6.4, AST at 14, Total Bilirubin at <0.2, LDH at 210.   On review of systems, pt reports steady weight, appetite and denies fatigue, chills, fevers, leg swelling, night sweats, unexpected weight loss, new lumps and bumps, back pain, abdominal pain and any other symptoms.   MEDICAL HISTORY:  Past Medical History:  Diagnosis Date  . GERD (gastroesophageal reflux disease)   . HH (hiatus hernia) 12/12/2011  . Hypertension   . Leukopenia 12/12/2011  . nhl dx'd 2010  . Seizures ([place]) reports "years ago"  . Syncope 12/12/2011   Hx seizures - not documented - age 38; recent syncope 6/12 and x2, 12/12; negative MRI brain 1/13    SURGICAL HISTORY: Past Surgical History:  Procedure Laterality Date  . COLON SURGERY  2010   due to cancer  . IR IMAGING GUIDED PORT INSERTION  08/23/2018  . IR PATIENT EVAL TECH 0-60 MINS  04/22/2019  . IR PATIENT EVAL TECH 0-60 MINS  04/25/2019  . IR PATIENT EVAL TECH 0-60 MINS  04/28/2019  . IR PATIENT EVAL TECH 0-60 MINS  05/02/2019  . IR REMOVAL TUN ACCESS W/ PORT W/O FL MOD SED  04/18/2019    SOCIAL HISTORY: Social History   Socioeconomic History  . Marital status: Married    Spouse name: Not on file  . Number of children: Not on file  . Years of education: Not on file  . Highest education level: Not on file  Occupational History  . Not on file  Social Needs  . Financial resource strain: Not on file  . Food insecurity    Worry: Not on file    Inability: Not on file  . Transportation needs    Medical: No    Non-medical: No  Tobacco Use  . Smoking status: Never Smoker  . Smokeless tobacco: Never Used  Substance and Sexual Activity  . Alcohol use: No  . Drug use: No  . Sexual activity: Not on file  Lifestyle  . Physical activity    Days per week: Not on file    Minutes per session: Not on file  .  Stress: Not on file  Relationships  . Social Herbalist on phone: Not on file    Gets together: Not on file    Attends religious service: Not on file    Active member of club or organization: Not on file    Attends meetings of clubs or organizations: Not on file    Relationship status: Not on file  . Intimate partner violence    Fear of current or ex partner: No    Emotionally abused: No    Physically abused: No    Forced sexual activity: No  Other Topics Concern  . Not on file  Social History Narrative  . Not on file    FAMILY HISTORY: Family History  Problem Relation Age of Onset  . Diabetes Mother   . Diabetes Father     ALLERGIES:  has No Known Allergies.  MEDICATIONS:  Current Outpatient Medications  Medication Sig Dispense Refill  . amLODipine (NORVASC) 10 MG tablet Take 10 mg by mouth every other day.     . cholecalciferol (VITAMIN D) 1000 UNITS tablet Take 1,000 Units by mouth daily.      Marland Kitchen lidocaine-prilocaine (EMLA) cream Apply 1 application topically as needed. 30 g 1  . Melatonin 5 MG TABS Take 5 mg by mouth at bedtime.     Marland Kitchen omeprazole (PRILOSEC) 20 MG capsule Take 20 mg by mouth daily.    Marland Kitchen sulfamethoxazole-trimethoprim (BACTRIM DS) 800-160 MG tablet Take 1 tablet by mouth 2 (two) times daily. 14 tablet 0  . vitamin B-12 (CYANOCOBALAMIN) 1000 MCG tablet Take 1,000 mcg by mouth every other day.     No current facility-administered medications for this visit.     REVIEW OF SYSTEMS:    A 10+ POINT REVIEW OF SYSTEMS WAS OBTAINED including neurology, dermatology, psychiatry, cardiac, respiratory, lymph, extremities, GI, GU, Musculoskeletal, constitutional, breasts, reproductive, HEENT.  All pertinent positives are noted in the HPI.  All others are negative.    PHYSICAL EXAMINATION: ECOG FS:2 - Symptomatic, <50% confined to bed  There were no vitals filed for this visit. Wt Readings from Last 3 Encounters:  05/08/19 161 lb (73 kg)  04/18/19 161  lb (73 kg)  04/15/19 161 lb 9.6 oz (73.3 kg)   There is no height or weight on file to calculate BMI.    GENERAL:alert, in no acute distress and comfortable SKIN: no acute rashes, no significant lesions EYES: conjunctiva are pink and non-injected, sclera anicteric OROPHARYNX: MMM, no exudates, no oropharyngeal erythema or ulceration NECK: supple, no JVD LYMPH:  no palpable lymphadenopathy in the cervical, axillary or inguinal regions LUNGS: clear to auscultation b/l with normal respiratory effort HEART: regular rate & rhythm ABDOMEN:  normoactive bowel sounds , non tender, not distended. Extremity: no pedal edema PSYCH: alert & oriented x 3 with fluent speech NEURO: no focal motor/sensory deficits   LABORATORY DATA:  I have reviewed the data as listed  . CBC Latest Ref Rng & Units 04/18/2019 02/19/2019 01/20/2019  WBC 4.0 - 10.5 K/uL 2.4(L) 2.1(L) 3.4(L)  Hemoglobin 12.0 - 15.0 g/dL 11.1(L) 9.9(L) 10.1(L)  Hematocrit 36.0 - 46.0 % 35.9(L) 31.4(L) 32.1(L)  Platelets 150 - 400 K/uL 211 180 204   ANC 1.2k . CMP Latest Ref Rng & Units 04/18/2019 02/19/2019 01/20/2019  Glucose 70 - 99 mg/dL 92 86 98  BUN 8 - 23 mg/dL '21 19 17  ' Creatinine 0.44 - 1.00 mg/dL 0.93 0.80 0.80  Sodium 135 - 145 mmol/L 141 142 141  Potassium 3.5 - 5.1 mmol/L 4.4 3.6 3.7  Chloride 98 - 111 mmol/L 107 111 110  CO2 22 - 32 mmol/L '25 25 24  ' Calcium 8.9 - 10.3 mg/dL 9.1 8.8(L) 8.6(L)  Total Protein 6.5 - 8.1 g/dL - 6.0(L) 6.2(L)  Total Bilirubin 0.3 - 1.2 mg/dL - 0.3 0.3  Alkaline Phos 38 - 126 U/L - 61 61  AST 15 - 41 U/L - 14(L) 17  ALT 0 - 44 U/L - 10 13   08/06/18 Biopsy:    06/01/09 Pathology:     07/18/18 CBC w/diff:   05/14/2019 NUCLEAR MEDICINE PET SKULL BASE TO THIGH (Accession [id number]   RADIOGRAPHIC STUDIES: I have personally reviewed the radiological images as listed and agreed with the findings in the report.  08/14/18 US Venous RLE:   Dg Wrist Complete Right  Result Date:  05/08/2019 CLINICAL DATA:  Patient states she did not pick her feet up and tripped on a rug 3 days ago. Patient states she hurt her right wrist and hit her head on a tile floor. EXAM: RIGHT WRIST - COMPLETE 3+ VIEW COMPARISON:  None. FINDINGS: Mild joint space narrowing in the wrist. Well corticated bone fragment identified adjacent to the ulnar styloid consistent with old injury or degenerative changes. No acute fracture or subluxation. IMPRESSION: No evidence for acute abnormality. Electronically Signed   By: Nolon Nations M.D.   On: 05/08/2019 11:44   Nm Pet Image Restag (ps) Skull Base To Thigh  Result Date: 05/14/2019 CLINICAL DATA:  Subsequent treatment strategy for diffuse large B-cell lymphoma. EXAM: NUCLEAR MEDICINE PET SKULL BASE TO THIGH TECHNIQUE: 8.0 mCi F-18 FDG was injected intravenously. Full-ring PET imaging was performed from the skull base to thigh after the radiotracer. CT data was obtained and used for attenuation correction and anatomic localization. Fasting blood glucose: 95 mg/dl COMPARISON:  PET-CT dated 01/14/2019 FINDINGS: Mediastinal blood pool activity: SUV max 2.9 Liver activity: SUV max 4.0 NECK: No suspicious cervical lymphadenopathy. Incidental CT findings: none CHEST: No hypermetabolic thoracic lymphadenopathy. No suspicious pulmonary nodules. Incidental CT findings: Mild atherosclerotic calcifications of the aortic root/arch. Mild coronary atherosclerosis of the LAD and left circumflex. ABDOMEN/PELVIS: No abnormal hypermetabolism in the liver, spleen, pancreas, or adrenal glands. Right inguinal nodes are improved, measuring up to 10 mm short axis (series 4/image 178), max SUV 2.9. Unfortunately, bilateral pelvic nodes are new/progressive, including: --11 mm short axis right deep inguinal node (series 4/image 166), max SUV 7.1 --10 mm short axis right pelvic sidewall node (series 4/image 162), max SUV 11.3 --8 mm short axis right common iliac node (series 4/image 145), max  SUV 12.8 --12 mm short axis left internal iliac node (series 4/image 152), max SUV 38.5 Incidental CT findings: none SKELETON: No focal hypermetabolic activity to suggest skeletal metastasis. Incidental CT findings: Degenerative changes of the visualized thoracolumbar spine. IMPRESSION: Progressive bilateral pelvic nodes, including a new left internal iliac node which is markedly FDG avid. Deauville criteria 5. Prior right inguinal lymphadenopathy is improved. Spleen is normal in size. Electronically Signed   By: Julian Hy M.D.   On: 05/14/2019 09:59   Ir Patient Eval Tech 0-60 Mins  Result Date: 05/02/2019 Chipper Oman     05/02/2019  9:40 AM Ptcame intoday for follow up port site dressing change 04-28-2019. Rowe Robert PAC evaluated the site after the packing had been removed.The site looked good with no signs of infection, redness or pain.  No further wet to dyr dressings are needed.  A new surface dressing with bacitracin ointment was applied.  Patient was advised to change the dressing immediately if it gets wet or Monday if it does not.  She should change the dressing every other day and apply a thin layer of vasoline to the gauze.  She can change to a bandaid on Friday 05/09/2019 if the wound starts to scab over.  Discontinue vasoline at that time. The patient was advised to call if she has any concerns.   Ir Patient Eval Tech 0-60 Mins  Result Date: 04/28/2019 Chipper O[place]     04/28/2019  9:25 AM Ptcame intoday for port site dressing change. Cristie Hem [facility] evaluated the pocket. The site looked good with no signs of infection, redness or pain. A new wet-to-dry gauze dressing was applied.She willreturn for anotherdressing change in 3 days.The patient was advised to call if she has any  concerns before her appointment.  Ir Patient Eval Tech 0-60 Mins  Result Date: 04/25/2019 Chipper O[place]     04/25/2019 10:31 AM Ptcame intoday for port site dressing change. Rowe Robert PAc evaluated the pocket. The site looked good with no signs of infection, redness or pain. A new wet-to-dry gauze dressing was applied.She willreturn for anotherdressing change in 3 days.The patient was advised to call if she has any concerns before her appointment.  Ir Patient Eval Tech 0-60 Mins  Result Date: 04/22/2019 Chipper Oman     04/24/2019  8:50 AM Pt came in today for port site dressing change.  Rowe Robert PAc evaluated the pocket after the existing iodoform dressing was removed and the site cleaned with betadine.  The site looked good with no signs of infection, redness or pain. A new  wet-to-dry gauze dressing was applied.  She will return for another dressing change in 3 days.  The patient was advised to call if she has any concerns before her appointment.   ASSESSMENT & PLAN:   78 y.o. female with  1. 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 2. Tiny hiatal hernia. 3. Pelvic floor laxity. Aortic Atherosclerosis.  Labs upon initial presentation from1/14/20, WBC normal at 4.3k, HGB at 10.6, PLT normal at 201k  08/06/18 Right Inguinal LN Biopsy which revealed Large B-Cell Lymphoma with a Ki67 of 90%. FISH is negative for rearrangements of BCL2, BCL6, and MYC. 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   08/13/18 Hep B and Hep C negative  08/21/18 ECHO which revealed LV EF of 50-55%  08/22/18 PET/CT revealedProgression, since 78/24/2353, of hypermetabolic abdominopelvic adenopathy, consistent with active lymphoma. 2. CT occult hypermetabolic osseous foci, also most consistent with active lymphoma. 3. No evidence of soft tissue disease above the diaphragm. 4. Focus of hypermetabolism within the anus is likely physiologic. Consider physical exam correlation. 5. Aortic Atherosclerosis.  10/24/18 PET/CT revealed Marked response to therapy of abdominopelvic lymphoma. Residual right pelvic hypermetabolic nodes. (Deauville 5). 2. No new sites of disease. 3. Marrow hypermetabolism is likely due to stimulation by chemotherapy. 4. Coronary artery atherosclerosis. Aortic Atherosclerosis.   S/p 5 cycles of EPOCH-R completed on 11/29/18, dose reduced for age and previous chemotherapy exposure. Discussed considerations of C6 EPOCH-R and the indications to do so. Pt strongly prefers to cease treatment now, after completing 5 cycles of EPOCH-R.   01/14/19 PET/CT revealed "Two progressive right inguinal nodes, as above (Deauville criteria 5). Otherwise, no suspicious lymphadenopathy in the neck, chest, abdomen, or pelvis. Spleen is normal in size."  3. RLE swelling r/o DVT. Could be from lymphedema related to her new/recurrent NHL 1/21/10US Venous RLE to rule out DVT - reviewed - no VTE  4. 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 No evidence of bleeding  5. DVT Px - lovenox [place]  PLAN:  -Discussed pt labwork today, 05/22/19; CBC w/diff and CMP is as follows: all values are WNL except for WBC at  2.3, RBC at 3.61, Hemoglobin at 10.4, HCT at 32.5, Neutro Abs at 1.2, Calcium at 8.6, Total Protein at 6.4, AST at 14, Total Bilirubin at <0.2, LDH at 210 -Discussed that she is not a transplant candidate due to her age . -Discussed PET/CT results showing concerning for recurrent/refractory large B cell lymphoma found in the  pelvis.  -Discussed the option for a biopsy then discuss further treatment if it confirms lymphoma. Pt would like to schedule a biopsy. -Advised that treatment is up to her and that she has a fast growing high grade lymphoma that appears to have progressed and that symptoms could start in as soon week or a few months. -Discussed the option for a phone visit or to rtc after biopsy. Pt will rtc.     FOLLOW UP: -CT biopsy in 1 week of FDG avid pelvic lymph node for concern for recurrent/progressive NHL -RTC with Dr Irene Limbo in 2 weeks  The total time spent in the appt was 25 minutes and more than 50% was on counseling and direct patient cares.  All of the patient's questions were answered with apparent satisfaction. The patient knows to call the clinic with any problems, questions or concerns.  Sullivan Lone MD MS AAHIVMS [facility] Hematology/Oncology Physician [facility]  (Office):       [phone] (Work cell):  [phone] (Fax):           [phone]  05/22/2019 3:07 AM  I, Scot Dock, am acting as a scribe for Dr. Sullivan Lone.   .I have reviewed the above documentation for accuracy and completeness, and I agree with the above. Brunetta Genera MD

## 2019-05-23 ENCOUNTER — Telehealth: Payer: Self-pay | Admitting: Hematology

## 2019-05-23 ENCOUNTER — Encounter (HOSPITAL_COMMUNITY): Payer: Self-pay | Admitting: Radiology

## 2019-05-23 NOTE — Progress Notes (Unsigned)
Taylor Huynh Female, 78 y.o., [birth year] MRN:  [id number] Phone:  [phone] (H) PCP:  [name], MD Coverage:  [employer] Next Appt With Oncology 06/05/2019 at 10:40 AM  RE: CT Biopsy Received: Yesterday Message Contents  Arne Cleveland, MD  [name]        Ok   CT core pelvic LAN for lymphoma   See PET -- mild enlargement but hypermetabolic  Perhaps L iliac LN on PET 05-14-2019 Im 162 Se 4  Rec careful correlation with PET, and attn to vasculature   DDH   Previous Messages  ----- Message -----  From: Garth Bigness D  Sent: 05/22/2019 11:38 AM EDT  To: Ir Procedure Requests  Subject: CT Biopsy                     Procedure:  CT Biopsy   Reason: Large cell (diffuse) non-Hodgkin's lymphoma, CT-guided core biopsy of FDG avid pelvic lymph node to evaluate for relapsed/refractory large B-cell lymphoma   History:  NM PET in computer   Dr. Brunetta Genera  [phone]

## 2019-05-23 NOTE — Telephone Encounter (Signed)
Scheduled appt per 10/29 los.  Spoke with pt and she is aware of her appt date and time.

## 2019-05-27 ENCOUNTER — Other Ambulatory Visit: Payer: Self-pay | Admitting: Radiology

## 2019-05-28 ENCOUNTER — Other Ambulatory Visit: Payer: Self-pay | Admitting: Radiology

## 2019-05-29 ENCOUNTER — Encounter (HOSPITAL_COMMUNITY): Payer: Self-pay

## 2019-05-29 ENCOUNTER — Ambulatory Visit (HOSPITAL_COMMUNITY)
Admission: RE | Admit: 2019-05-29 | Discharge: 2019-05-29 | Disposition: A | Discharge status: Home / Self Care | Payer: Medicare Other | Source: Ambulatory Visit | Attending: Hematology | Admitting: Hematology

## 2019-05-29 ENCOUNTER — Other Ambulatory Visit: Payer: Self-pay

## 2019-05-29 ENCOUNTER — Telehealth: Payer: Self-pay | Admitting: Hematology

## 2019-05-29 ENCOUNTER — Telehealth: Payer: Self-pay | Admitting: *Deleted

## 2019-05-29 DIAGNOSIS — Z539 Procedure and treatment not carried out, unspecified reason: Secondary | ICD-10-CM | POA: Insufficient documentation

## 2019-05-29 DIAGNOSIS — C8330 Diffuse large B-cell lymphoma, unspecified site: Secondary | ICD-10-CM | POA: Diagnosis not present

## 2019-05-29 LAB — CBC WITH DIFFERENTIAL/PLATELET
Abs Immature Granulocytes: 0 10*3/uL (ref 0.00–0.07)
Basophils Absolute: 0 10*3/uL (ref 0.0–0.1)
Basophils Relative: 1 %
Eosinophils Absolute: 0.1 10*3/uL (ref 0.0–0.5)
Eosinophils Relative: 5 %
HCT: 35.9 % — ABNORMAL LOW (ref 36.0–46.0)
Hemoglobin: 11.2 g/dL — ABNORMAL LOW (ref 12.0–15.0)
Immature Granulocytes: 0 %
Lymphocytes Relative: 36 %
Lymphs Abs: 0.7 10*3/uL (ref 0.7–4.0)
MCH: 28.8 pg (ref 26.0–34.0)
MCHC: 31.2 g/dL (ref 30.0–36.0)
MCV: 92.3 fL (ref 80.0–100.0)
Monocytes Absolute: 0.4 10*3/uL (ref 0.1–1.0)
Monocytes Relative: 19 %
Neutro Abs: 0.8 10*3/uL — ABNORMAL LOW (ref 1.7–7.7)
Neutrophils Relative %: 39 %
Platelets: 236 10*3/uL (ref 150–400)
RBC: 3.89 MIL/uL (ref 3.87–5.11)
RDW: 14.2 % (ref 11.5–15.5)
WBC: 2.1 10*3/uL — ABNORMAL LOW (ref 4.0–10.5)
nRBC: 0 % (ref 0.0–0.2)

## 2019-05-29 LAB — PROTIME-INR
INR: 1 (ref 0.8–1.2)
Prothrombin Time: 12.6 seconds (ref 11.4–15.2)

## 2019-05-29 MED ORDER — SODIUM CHLORIDE 0.9 % IV SOLN
INTRAVENOUS | Status: DC
  Administered 2019-05-29: 08:00:00 via INTRAVENOUS

## 2019-05-29 NOTE — Telephone Encounter (Signed)
Called pt per 11/5 sch message - pt does not want to schedule a f/u, she says she doesn't think she wants the ct biopsy done. Unsure if follow up is needed . Message sent to RN [facility]

## 2019-05-29 NOTE — Progress Notes (Signed)
Patient was scheduled for an image-guided left iliac lymph node biopsy at the request of Dr. Irene Limbo, tentatively for today at 0900 with Dr. Kathlene Cote.  Per patient and RN, patient drank approximately 1/2 cup of water at approximately 0600 this AM. Discussed with Dr. Andres Labrum who states clear liquids is a minimum 4 hour hold prior to sedation. Due to this along with scheduling, procedure cannot occur today and will need to be rescheduled. Discussed above with patient. She was upset that her procedure would not get done today. Explained the importance of NPO, and how if we were to give her sedation without a 4 hour hold on clear liquids (and 6 hour minimum NPO overall), there is a possibility she can aspirate. Gave patient paper for rescheduling procedure, however at this time she would not like to be rescheduled. All questions answered and concerns addressed. Will make Dr. Irene Limbo aware.  IR available in future if needed.   Bea Graff Louk, PA-C 05/29/2019, 8:48 AM

## 2019-05-29 NOTE — Discharge Instructions (Signed)
Moderate Conscious Sedation, Adult, Care After °These instructions provide you with information about caring for yourself after your procedure. Your health care provider may also give you more specific instructions. Your treatment has been planned according to current medical practices, but problems sometimes occur. Call your health care provider if you have any problems or questions after your procedure. °What can I expect after the procedure? °After your procedure, it is common: °· To feel sleepy for several hours. °· To feel clumsy and have poor balance for several hours. °· To have poor judgment for several hours. °· To vomit if you eat too soon. °Follow these instructions at home: °For at least 24 hours after the procedure: ° °· Do not: °? Participate in activities where you could fall or become injured. °? Drive. °? Use heavy machinery. °? Drink alcohol. °? Take sleeping pills or medicines that cause drowsiness. °? Make important decisions or sign legal documents. °? Take care of children on your own. °· Rest. °Eating and drinking °· Follow the diet recommended by your health care provider. °· If you vomit: °? Drink water, juice, or soup when you can drink without vomiting. °? Make sure you have little or no nausea before eating solid foods. °General instructions °· Have a responsible adult stay with you until you are awake and alert. °· Take over-the-counter and prescription medicines only as told by your health care provider. °· If you smoke, do not smoke without supervision. °· Keep all follow-up visits as told by your health care provider. This is important. °Contact a health care provider if: °· You keep feeling nauseous or you keep vomiting. °· You feel light-headed. °· You develop a rash. °· You have a fever. °Get help right away if: °· You have trouble breathing. °This information is not intended to replace advice given to you by your health care provider. Make sure you discuss any questions you have  with your health care provider. °Document Released: 04/30/2013 Document Revised: 06/22/2017 Document Reviewed: 10/30/2015 °Elsevier Patient Education © 2020 [employer] ° °

## 2019-05-29 NOTE — Progress Notes (Signed)
Procedure cancelled per MD , pt had some water at 6 am ; pt wanting to wait for her daughet in the lobby; called  Trulus (daughter) and she is aware of situation and is coming to pick patient up. Pt alert and oriented x 4 and walking with a strong and steady gait

## 2019-05-29 NOTE — Telephone Encounter (Signed)
Ms. Linebarger called - was in IR this morning waiting for biopsy. Patient states she is very upset.  She states her IV was in and she was on the table when she was asked if she had anything to eat or drink this morning. She states she told staff she had some water and then they had a meeting and told her they would have to reschedule her. She is upset as she said she was told by phone a week ago when appt was scheduled that some water with medicine was ok. Family had made arrangements to bring her and stay with her today.   Biopsy has now been rescheduled for [facility] on 11/12 (appt with Dr.Kale on 11/12 will need to be r/s for 4 working days after biopsy per Dr. Irene Limbo)  Ms. Calender states she is very tired of all of this and maybe this is a sign from God that she should put this in God's hands and just not continue treatment. Encouraged to not cancel appt today and take time to consider all her options. Informed her that bx appt will not be cancelled at this time and schedule message will be sent for follow up appt with Dr. Irene Limbo for the following week to discuss it. After consideration, if she decides to cancel appointments, she can call office, but again encouraged her to consider all options. Patient verbalized understanding.

## 2019-06-04 ENCOUNTER — Telehealth: Payer: Self-pay | Admitting: *Deleted

## 2019-06-04 ENCOUNTER — Other Ambulatory Visit: Payer: Self-pay | Admitting: Physician Assistant

## 2019-06-04 NOTE — Telephone Encounter (Signed)
Patient called -LVVM - she has cancelled CT Biopsy scheduled on 06/05/2019

## 2019-06-05 ENCOUNTER — Ambulatory Visit: Payer: Medicare Other | Admitting: Hematology

## 2019-06-05 ENCOUNTER — Ambulatory Visit (HOSPITAL_COMMUNITY): Admission: RE | Admit: 2019-06-05 | Payer: Medicare Other | Source: Ambulatory Visit

## 2019-06-10 ENCOUNTER — Telehealth: Payer: Self-pay | Admitting: *Deleted

## 2019-06-10 NOTE — Telephone Encounter (Signed)
Patient called - she asked that appt for 11/19 with Dr.Kale be cancelled as she has not had biopsy. She wanted somone else to have chance for appt. Appt cancelled at pt request. States she is not sure she will ever have biopsy. See previous phone notes for patient thoughts on biopsy and further treatment as she restated them. Encouraged patient to contact office if she wishes to resume care. Patient verbalized understanding and thanked caller

## 2019-06-12 ENCOUNTER — Ambulatory Visit: Payer: Medicare Other | Admitting: Hematology

## 2019-09-04 IMAGING — CT NUCLEAR MEDICINE PET IMAGE RESTAGING (PS) SKULL BASE TO THIGH
1 of 5 series · 7 of 40 positions shown, 9 images · non-contrast
Comparison: PET 08/22/2018.

CLINICAL DATA: Subsequent treatment strategy for restaging of large
B-cell lymphoma. Status post 3 cycles of chemotherapy.

EXAM:
NUCLEAR MEDICINE PET SKULL BASE TO THIGH
TECHNIQUE: 8.6 mCi F-18 FDG was injected intravenously. Full-ring PET imaging
was performed from the skull base to thigh after the radiotracer. CT
data was obtained and used for attenuation correction and anatomic
localization.
Fasting blood glucose: 101 mg/dl

[Series 4: ct sk_thigh 5.0 b31f · axial · 0.98mm/px · z∈[-754,-118]mm · 7 of 213 slices shown, 9 images]
[im 27/213  brain]
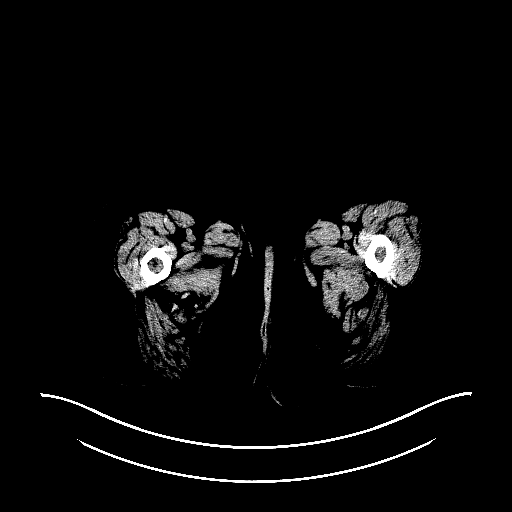
[im 27/213  bone]
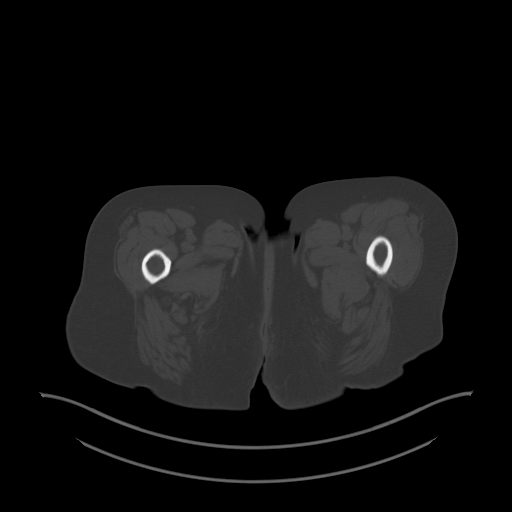
[im 54/213  brain]
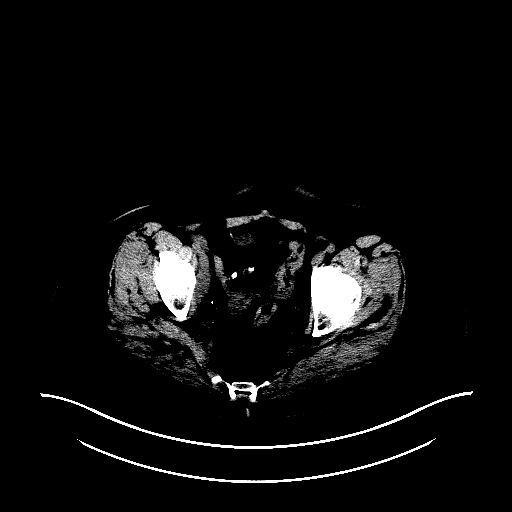
[im 80/213  brain]
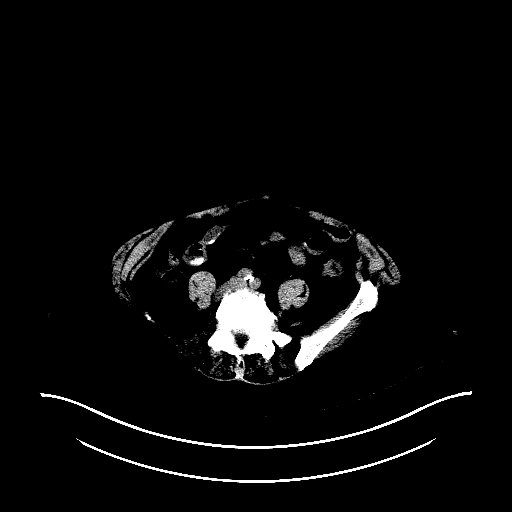
[im 107/213  brain]
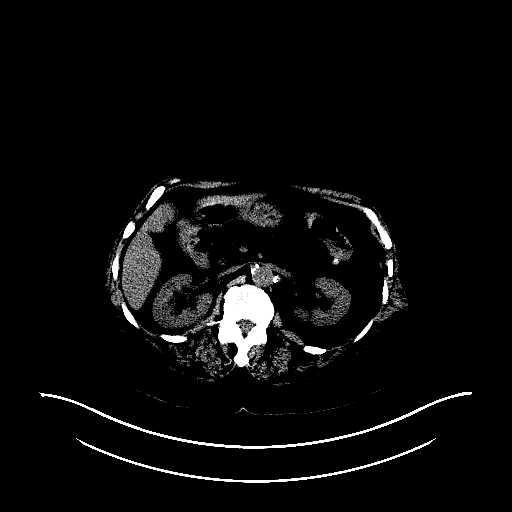
[im 133/213  brain]
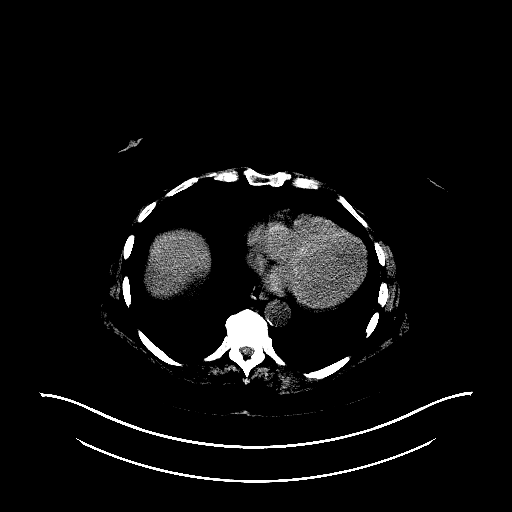
[im 133/213  bone]
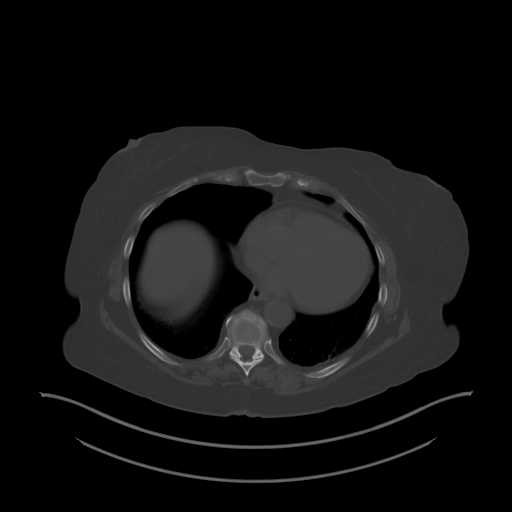
[im 160/213  brain]
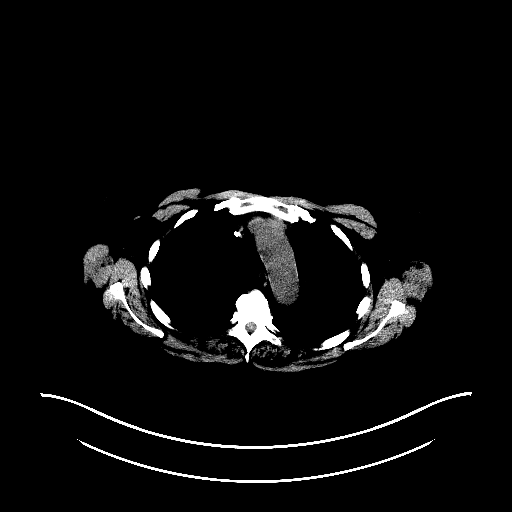
[im 186/213  brain]
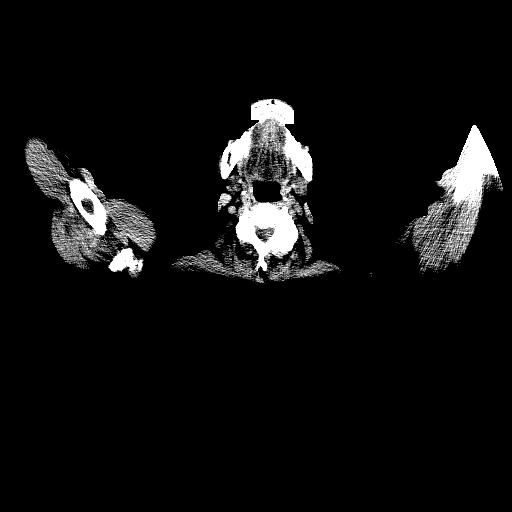

[7 of 40 positions shown; findings below may reference images not displayed]

FINDINGS: Mediastinal blood pool activity: SUV max

Liver activity: S.U.V. max

NECK: No areas of abnormal hypermetabolism.

Incidental CT findings: No cervical adenopathy. Bilateral carotid
atherosclerosis.

CHEST: No pulmonary parenchymal or thoracic nodal hypermetabolism.

Incidental CT findings: Aortic and proximal LAD coronary artery
calcification. Right Port-A-Cath tip at superior caval/atrial
junction. No thoracic adenopathy. Tiny hiatal hernia.

ABDOMEN/PELVIS: Resolution of previously described hypermetabolic
abdominal adenopathy.

Right common iliac node measures 1.2 cm and a S.U.V. max of 12.1 on
image 145/4. Adenopathy in this area on the prior measured 3.5 cm
and a S.U.V. max of 39.3.

Hypermetabolic residual right inguinal nodes. Example at 1.8 cm and
a S.U.V. max of 9.7 on image 178/4. Compare 4.6 cm and a S.U.V. max
of 43.7 on the prior.

Incidental CT findings: Normal adrenal glands. Cholecystectomy. Mild
hepatic steatosis. Low-density left renal lesion is likely a cyst.
Abdominal aortic atherosclerosis. 9 mm aortocaval node is decreased
in size from 2.5 cm on the prior (when remeasured).

Hysterectomy. Pelvic floor laxity. Enterotomy. Fatty replacement
throughout the pancreas.

SKELETON: Diffuse marrow hypermetabolism is likely due to
stimulation by chemotherapy. The previously described focal areas of
marrow hypermetabolism may have resolved or be obscured by the
diffuse process.

Incidental CT findings: none
IMPRESSION: 1. Marked response to therapy of abdominopelvic lymphoma. Residual
right pelvic hypermetabolic nodes. ([HOSPITAL] 5).
2. No new sites of disease.
3. Marrow hypermetabolism is likely due to stimulation by
chemotherapy.
4. Coronary artery atherosclerosis. Aortic Atherosclerosis
([facility]-ED[facility].H).

## 2019-12-02 ENCOUNTER — Other Ambulatory Visit: Payer: Self-pay | Admitting: Internal Medicine

## 2019-12-02 ENCOUNTER — Ambulatory Visit
Admission: RE | Admit: 2019-12-02 | Discharge: 2019-12-02 | Disposition: A | Discharge status: Home / Self Care | Payer: Medicare Other | Source: Ambulatory Visit | Attending: Internal Medicine | Admitting: Internal Medicine

## 2019-12-02 DIAGNOSIS — M545 Low back pain, unspecified: Secondary | ICD-10-CM

## 2019-12-02 DIAGNOSIS — M25472 Effusion, left ankle: Secondary | ICD-10-CM

## 2019-12-02 DIAGNOSIS — M25471 Effusion, right ankle: Secondary | ICD-10-CM

## 2019-12-17 ENCOUNTER — Telehealth: Payer: Self-pay | Admitting: *Deleted

## 2019-12-17 NOTE — Telephone Encounter (Signed)
Patient called. She was told by her PCP that she needed to see Dr. Irene Limbo. Patient states she has arthritis pain and her legs swell some (PCP ordered stockings). She states she does not have any symptoms. Reminded her that when Dr.Kale saw her in the fall, he ordered a biopsy and she declined it at that time. Advised that she could make appointment to see Dr. Irene Limbo to discuss her condition. She siad she isn't sure what she wants to do.  Asked her directly if she wants to have appointment here to come in and seeDr. Irene Limbo and she stated she was not sure as she has "already been through so much and she's 79" and does not feel bad at this time. She said she only called because her primary doctor told her she needed too.   Informed her that office will make her an appointment to see Dr. Irene Limbo at any time and encouraged her to contact the office. She said she would think about it and call back. Dr. Irene Limbo informed of conversation with patient

## 2020-03-22 ENCOUNTER — Telehealth: Payer: Self-pay | Admitting: Gastroenterology

## 2020-03-22 NOTE — Telephone Encounter (Signed)
The pt has been advised that she needs to keep her appt for further recommendations or she can follow up with her PCP.  She has an appt with Nevin Bloodgood on 10/4 and she will call PCP

## 2020-03-28 ENCOUNTER — Inpatient Hospital Stay (HOSPITAL_COMMUNITY)
Admission: EM | Admit: 2020-03-28 | Discharge: 2020-03-31 | DRG: 381 | Disposition: A | Discharge status: Home / Self Care | Payer: Medicare Other | Attending: Family Medicine | Admitting: Family Medicine

## 2020-03-28 ENCOUNTER — Emergency Department (HOSPITAL_COMMUNITY): Payer: Medicare Other

## 2020-03-28 ENCOUNTER — Encounter (HOSPITAL_COMMUNITY): Payer: Self-pay | Admitting: Obstetrics and Gynecology

## 2020-03-28 ENCOUNTER — Other Ambulatory Visit: Payer: Self-pay

## 2020-03-28 DIAGNOSIS — K254 Chronic or unspecified gastric ulcer with hemorrhage: Secondary | ICD-10-CM | POA: Diagnosis present

## 2020-03-28 DIAGNOSIS — K219 Gastro-esophageal reflux disease without esophagitis: Secondary | ICD-10-CM | POA: Diagnosis present

## 2020-03-28 DIAGNOSIS — Z66 Do not resuscitate: Secondary | ICD-10-CM | POA: Diagnosis present

## 2020-03-28 DIAGNOSIS — R630 Anorexia: Secondary | ICD-10-CM | POA: Diagnosis present

## 2020-03-28 DIAGNOSIS — K2970 Gastritis, unspecified, without bleeding: Secondary | ICD-10-CM

## 2020-03-28 DIAGNOSIS — K2211 Ulcer of esophagus with bleeding: Secondary | ICD-10-CM | POA: Diagnosis not present

## 2020-03-28 DIAGNOSIS — Z6829 Body mass index (BMI) 29.0-29.9, adult: Secondary | ICD-10-CM

## 2020-03-28 DIAGNOSIS — E860 Dehydration: Secondary | ICD-10-CM | POA: Diagnosis present

## 2020-03-28 DIAGNOSIS — Z79899 Other long term (current) drug therapy: Secondary | ICD-10-CM

## 2020-03-28 DIAGNOSIS — K317 Polyp of stomach and duodenum: Secondary | ICD-10-CM | POA: Diagnosis present

## 2020-03-28 DIAGNOSIS — Z9102 Food additives allergy status: Secondary | ICD-10-CM

## 2020-03-28 DIAGNOSIS — R531 Weakness: Secondary | ICD-10-CM

## 2020-03-28 DIAGNOSIS — K259 Gastric ulcer, unspecified as acute or chronic, without hemorrhage or perforation: Secondary | ICD-10-CM

## 2020-03-28 DIAGNOSIS — Z833 Family history of diabetes mellitus: Secondary | ICD-10-CM

## 2020-03-28 DIAGNOSIS — K2971 Gastritis, unspecified, with bleeding: Secondary | ICD-10-CM | POA: Diagnosis present

## 2020-03-28 DIAGNOSIS — K264 Chronic or unspecified duodenal ulcer with hemorrhage: Secondary | ICD-10-CM | POA: Diagnosis present

## 2020-03-28 DIAGNOSIS — Z923 Personal history of irradiation: Secondary | ICD-10-CM

## 2020-03-28 DIAGNOSIS — D62 Acute posthemorrhagic anemia: Secondary | ICD-10-CM | POA: Diagnosis not present

## 2020-03-28 DIAGNOSIS — K922 Gastrointestinal hemorrhage, unspecified: Secondary | ICD-10-CM | POA: Diagnosis present

## 2020-03-28 DIAGNOSIS — I809 Phlebitis and thrombophlebitis of unspecified site: Secondary | ICD-10-CM | POA: Diagnosis present

## 2020-03-28 DIAGNOSIS — Z20822 Contact with and (suspected) exposure to covid-19: Secondary | ICD-10-CM | POA: Diagnosis present

## 2020-03-28 DIAGNOSIS — K2101 Gastro-esophageal reflux disease with esophagitis, with bleeding: Secondary | ICD-10-CM | POA: Diagnosis present

## 2020-03-28 DIAGNOSIS — Z9221 Personal history of antineoplastic chemotherapy: Secondary | ICD-10-CM

## 2020-03-28 DIAGNOSIS — K449 Diaphragmatic hernia without obstruction or gangrene: Secondary | ICD-10-CM

## 2020-03-28 DIAGNOSIS — C8590 Non-Hodgkin lymphoma, unspecified, unspecified site: Secondary | ICD-10-CM | POA: Diagnosis not present

## 2020-03-28 DIAGNOSIS — K2991 Gastroduodenitis, unspecified, with bleeding: Secondary | ICD-10-CM | POA: Diagnosis present

## 2020-03-28 DIAGNOSIS — R600 Localized edema: Secondary | ICD-10-CM

## 2020-03-28 DIAGNOSIS — I10 Essential (primary) hypertension: Secondary | ICD-10-CM | POA: Diagnosis present

## 2020-03-28 DIAGNOSIS — R1310 Dysphagia, unspecified: Secondary | ICD-10-CM | POA: Diagnosis present

## 2020-03-28 DIAGNOSIS — C799 Secondary malignant neoplasm of unspecified site: Secondary | ICD-10-CM

## 2020-03-28 DIAGNOSIS — K269 Duodenal ulcer, unspecified as acute or chronic, without hemorrhage or perforation: Secondary | ICD-10-CM

## 2020-03-28 DIAGNOSIS — K30 Functional dyspepsia: Secondary | ICD-10-CM | POA: Diagnosis present

## 2020-03-28 DIAGNOSIS — N179 Acute kidney failure, unspecified: Secondary | ICD-10-CM

## 2020-03-28 DIAGNOSIS — R6881 Early satiety: Secondary | ICD-10-CM | POA: Diagnosis present

## 2020-03-28 LAB — CBC
HCT: 28.7 % — ABNORMAL LOW (ref 36.0–46.0)
Hemoglobin: 9.2 g/dL — ABNORMAL LOW (ref 12.0–15.0)
MCH: 28.6 pg (ref 26.0–34.0)
MCHC: 32.1 g/dL (ref 30.0–36.0)
MCV: 89.1 fL (ref 80.0–100.0)
Platelets: 196 10*3/uL (ref 150–400)
RBC: 3.22 MIL/uL — ABNORMAL LOW (ref 3.87–5.11)
RDW: 16.8 % — ABNORMAL HIGH (ref 11.5–15.5)
WBC: 4.8 10*3/uL (ref 4.0–10.5)
nRBC: 0 % (ref 0.0–0.2)

## 2020-03-28 LAB — BASIC METABOLIC PANEL
Anion gap: 13 (ref 5–15)
BUN: 24 mg/dL — ABNORMAL HIGH (ref 8–23)
CO2: 20 mmol/L — ABNORMAL LOW (ref 22–32)
Calcium: 9.5 mg/dL (ref 8.9–10.3)
Chloride: 105 mmol/L (ref 98–111)
Creatinine, Ser: 1.13 mg/dL — ABNORMAL HIGH (ref 0.44–1.00)
GFR calc Af Amer: 53 mL/min — ABNORMAL LOW (ref >60)
GFR calc non Af Amer: 46 mL/min — ABNORMAL LOW (ref >60)
Glucose, Bld: 114 mg/dL — ABNORMAL HIGH (ref 70–99)
Potassium: 3.5 mmol/L (ref 3.5–5.1)
Sodium: 138 mmol/L (ref 135–145)

## 2020-03-28 NOTE — ED Provider Notes (Signed)
[name] DEPT Provider Note   CSN: [id number] Arrival date & time: 03/28/20  1304     History Chief Complaint  Patient presents with  . Fatigue    Taylor Huynh is a 79 y.o. female.  Patient with history of non-Hodgkin's B-cell lymphoma, GERD, hypertension, syncope here with 3 to 4 weeks of generalized weakness, anorexia, early satiety, dry heaving, dark stools, difficulty swallowing and fatigue.  Patient states difficulty with swallowing and multiple episodes of nausea and vomiting on almost daily basis.  Stools have been dark.  Has crampy upper abdominal pain and difficulty swallowing with anorexia, weight loss and early satiety.  Denies any fever.  Denies chest pain.  Complains of "acid reflux and indigestion" with difficulty swallowing and frequent episodes of spitting up.  The patient states she has a history of B-cell lymphoma and not currently receiving any treatment.  It sounds like treatment was still recommended by her oncologist Dr. Irene Limbo but patient elected to stop treatment about 1 year ago.  Patient states she was frustrated over a CT lymph node biopsy that did not occur and decided to stop going back for treatment. She has noticed increased shortness of breath as well as right leg swelling.  No chest pain.  No cough. She returns now with worsening symptoms and anorexia and generalized fatigue and malaise.  The history is provided by the patient and a relative.       Past Medical History:  Diagnosis Date  . GERD (gastroesophageal reflux disease)   . HH (hiatus hernia) 12/12/2011  . Hypertension   . Leukopenia 12/12/2011  . nhl dx'd 2010  . Seizures ([facility]) reports "years ago"  . Syncope 12/12/2011   Hx seizures - not documented - age 31; recent syncope 6/12 and x2, 12/12; negative MRI brain 1/13    Patient Active Problem List   Diagnosis Date Noted  . Large cell (diffuse) non-Hodgkin's lymphoma ([facility]) 11/04/2018  . Hypokalemia   .  Anemia   . Hypocalcemia   . Hypomagnesemia   . Encounter for antineoplastic chemotherapy   . Diffuse large B cell lymphoma ([facility]) 08/29/2018  . Counseling regarding advance care planning and goals of care 08/29/2018  . 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[facility]) 08/29/2018  . Hx of diverticulitis of colon 12/12/2011  . Leukopenia 12/12/2011  . Syncope 12/12/2011  . HH (hiatus hernia) 12/12/2011  . 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 GERD 05/12/2008    Past Surgical History:  Procedure Laterality Date  . COLON SURGERY  2010   due to cancer  . IR IMAGING GUIDED PORT INSERTION  08/23/2018  . IR PATIENT EVAL TECH 0-60 MINS  04/22/2019  . IR PATIENT EVAL TECH 0-60 MINS  04/25/2019  . IR PATIENT EVAL TECH 0-60 MINS  04/28/2019  . IR PATIENT EVAL TECH 0-60 MINS  05/02/2019  . IR REMOVAL TUN ACCESS W/ PORT W/O FL MOD SED  04/18/2019     OB History   No obstetric history on file.     Family History  Problem Relation Age of Onset  . Diabetes Mother   . Diabetes Father     Social History   Tobacco Use  . Smoking status: Never Smoker  . Smokeless tobacco: Never Used  Vaping Use  . Vaping Use: Never used  Substance Use Topics  . Alcohol use: No  . Drug use: No    Home Medications Prior to Admission  medications   Medication Sig Start Date End Date Taking? Authorizing Provider  amLODipine (NORVASC) 10 MG tablet Take 10 mg by mouth every other day.  11/02/11   [provider]  cholecalciferol (VITAMIN D) 1000 UNITS tablet Take 1,000 Units by mouth daily.      [provider]  lidocaine-prilocaine (EMLA) cream Apply 1 application topically as needed. 08/28/18   Brunetta Genera, MD  Melatonin 5 MG TABS Take 5 mg by mouth at bedtime.     [provider]  omeprazole (PRILOSEC) 20 MG capsule Take 20 mg by mouth daily. 09/30/18   [provider]  vitamin B-12 (CYANOCOBALAMIN) 1000 MCG tablet  Take 1,000 mcg by mouth every other day.    [provider]    Allergies    Patient has no known allergies.  Review of Systems   Review of Systems  Constitutional: Positive for activity change and fatigue.  HENT: Negative for congestion and rhinorrhea.   Respiratory: Positive for shortness of breath. Negative for chest tightness.   Cardiovascular: Positive for leg swelling.  Gastrointestinal: Positive for abdominal pain, nausea and vomiting.  Genitourinary: Negative for dysuria and hematuria.  Musculoskeletal: Positive for arthralgias and myalgias.  Neurological: Positive for weakness. Negative for headaches.   all other systems are negative except as noted in the HPI and PMH.    Physical Exam Updated Vital Signs BP 131/71 (BP Location: Left Arm)   Pulse 94   Temp 99.8 F (37.7 C) (Oral)   Resp 16   LMP  (LMP Unknown)   SpO2 95%   Physical Exam Vitals and nursing note reviewed.  Constitutional:      General: She is not in acute distress.    Appearance: She is well-developed.     Comments: Fatigued and pale appearing  HENT:     Head: Normocephalic and atraumatic.     Mouth/Throat:     Pharynx: No oropharyngeal exudate.  Eyes:     Conjunctiva/sclera: Conjunctivae normal.     Pupils: Pupils are equal, round, and reactive to light.  Neck:     Comments: No meningismus. Cardiovascular:     Rate and Rhythm: Normal rate and regular rhythm.     Heart sounds: Normal heart sounds. No murmur heard.   Pulmonary:     Effort: Pulmonary effort is normal. No respiratory distress.     Breath sounds: Normal breath sounds.  Abdominal:     Palpations: Abdomen is soft.     Tenderness: There is abdominal tenderness. There is no guarding or rebound.     Comments: Mild diffuse tenderness  Genitourinary:    Comments: Chaperone present, no gross blood, brown stool Musculoskeletal:        General: No tenderness. Normal range of motion.     Cervical back: Normal range of motion  and neck supple.  Skin:    General: Skin is warm.     Capillary Refill: Capillary refill takes less than 2 seconds.  Neurological:     General: No focal deficit present.     Mental Status: She is alert and oriented to person, place, and time. Mental status is at baseline.     Cranial Nerves: No cranial nerve deficit.     Motor: No abnormal muscle tone.     Coordination: Coordination normal.     Comments:  5/5 strength throughout. CN 2-12 intact.Equal grip strength.   Psychiatric:        Behavior: Behavior normal.     ED  Results / Procedures / Treatments   Labs (all labs ordered are listed, but only abnormal results are displayed) Labs Reviewed  BASIC METABOLIC PANEL - Abnormal; Notable for the following components:      Result Value   CO2 20 (*)    Glucose, Bld 114 (*)    BUN 24 (*)    Creatinine, Ser 1.13 (*)    GFR calc non Af Amer 46 (*)    GFR calc Af Amer 53 (*)    All other components within normal limits  CBC - Abnormal; Notable for the following components:   RBC 3.22 (*)    Hemoglobin 9.2 (*)    HCT 28.7 (*)    RDW 16.8 (*)    All other components within normal limits  HEMOGLOBIN AND HEMATOCRIT, BLOOD - Abnormal; Notable for the following components:   Hemoglobin 8.8 (*)    HCT 27.7 (*)    All other components within normal limits  HEPATIC FUNCTION PANEL - Abnormal; Notable for the following components:   Total Protein 5.4 (*)    Albumin 3.0 (*)    All other components within normal limits  POC OCCULT BLOOD, ED - Abnormal; Notable for the following components:   Fecal Occult Bld POSITIVE (*)    All other components within normal limits  SARS CORONAVIRUS 2 BY RT PCR (HOSPITAL ORDER, [facility])  TSH  PROTIME-INR  LIPASE, BLOOD  URINALYSIS, ROUTINE W REFLEX MICROSCOPIC  TYPE AND SCREEN  TROPONIN I (HIGH SENSITIVITY)    EKG EKG Interpretation  Date/Time:  Monday March 29 2020 00:31:04 EDT Ventricular Rate:  111 PR  Interval:    QRS Duration: 107 QT Interval:  334 QTC Calculation: 109 R Axis:   38 Text Interpretation: Sinus tachycardia Ventricular premature complex Anterior infarct, old Borderline T abnormalities, inferior leads Rate faster Confirmed by Ezequiel Essex [id number]) on 03/29/2020 12:43:38 AM   Radiology DG Chest 2 View  Result Date: 03/28/2020 CLINICAL DATA:  Shortness of breath, weakness and lethargy. Non-Hodgkin's lymphoma. EXAM: CHEST - 2 VIEW COMPARISON:  PET-CT dated 05/14/2019. Chest radiographs dated 06/11/2016. FINDINGS: Interval multiple rounded and oval masses in both lungs, greater on the left. Some of these are pleural based and others appear to be within the lung parenchyma. Otherwise, the lungs are clear. Normal sized heart. Thoracic spine degenerative changes. IMPRESSION: Interval multiple bilateral lung masses, greater on the left, most compatible with metastatic disease and most likely related to the patient's non-Hodgkin's lymphoma. Electronically Signed   By: Claudie Revering M.D.   On: 03/28/2020 14:54   CT Angio Chest PE W and/or Wo Contrast  Result Date: 03/29/2020 CLINICAL DATA:  B-cell lymphoma. Anorexia, vomiting. Bowel obstruction suspected. EXAM: CT ANGIOGRAPHY CHEST WITH CONTRAST TECHNIQUE: Multidetector CT imaging of the chest was performed using the standard protocol during bolus administration of intravenous contrast. Multiplanar CT image reconstructions and MIPs were obtained to evaluate the vascular anatomy. CONTRAST:  148mL OMNIPAQUE IOHEXOL 350 MG/ML SOLN COMPARISON:  Chest CT 02/10/2010.  PET CT 08/22/2018 FINDINGS: Cardiovascular: Heart is normal size. Aorta normal caliber with scattered calcifications. Mediastinum/Nodes: Borderline right paratracheal lymph node with a short axis diameter of 9 mm. No axillary adenopathy. Borderline left hilar lymph nodes with a short axis diameter of 11 mm. Lungs/Pleura: Extensive bilateral pulmonary nodules and masses, most peripherally.  Index left lower lobe mass measures 4 cm on image 78. Index right lower lobe mass measures 5 cm on image 87. no effusions. Upper Abdomen:  Imaging into the upper abdomen demonstrates no acute findings. Musculoskeletal: Chest wall soft tissues are unremarkable. No acute bony abnormality. Review of the MIP images confirms the above findings. IMPRESSION: Innumerable bilateral pulmonary nodules and masses most compatible with metastatic disease. Borderline sized right paratracheal lymph nodes and left hilar lymph nodes. Aortic Atherosclerosis (ICD10-I70.0). Electronically Signed   By: Rolm Baptise M.D.   On: 03/29/2020 03:04   CT ABDOMEN PELVIS W CONTRAST  Result Date: 03/29/2020 CLINICAL DATA:  B-cell lymphoma. Bowel obstruction suspected. Anorexia, vomiting EXAM: CT ABDOMEN AND PELVIS WITH CONTRAST TECHNIQUE: Multidetector CT imaging of the abdomen and pelvis was performed using the standard protocol following bolus administration of intravenous contrast. CONTRAST:  19mL OMNIPAQUE IOHEXOL 350 MG/ML SOLN COMPARISON:  07/18/2018 FINDINGS: Lower chest: Innumerable bilateral pulmonary nodules and masses seen as seen on chest CT today. Heart is normal size. Hepatobiliary: No focal liver abnormality is seen. Status post cholecystectomy. No biliary dilatation. Pancreas: No focal abnormality or ductal dilatation. Spleen: No focal abnormality.  Normal size. Adrenals/Urinary Tract: Bilateral renal cysts. No adrenal mass or hydronephrosis. Urinary bladder decompressed, grossly unremarkable. Stomach/Bowel: Colonic diverticulosis. No active diverticulitis. No evidence of bowel obstruction. Stomach and small bowel decompressed, grossly unremarkable. Vascular/Lymphatic: Aortic atherosclerosis. No aneurysm. Bulky retroperitoneal adenopathy. Conglomerate nodal mass in the left periaortic region measures up to 6.4 cm. Numerous other retroperitoneal periaortic lymph nodes as well as pelvic sidewall/iliac chain lymph nodes bilaterally  right pelvic sidewall lymph node on image 69 as a diameter of 4.7 cm. Right inguinal adenopathy. Short axis diameter measures 2.1 cm. Reproductive: Prior hysterectomy.  No adnexal masses. Other: No free fluid or free air. Musculoskeletal: No acute bony abnormality. IMPRESSION: Innumerable bilateral pulmonary nodules and masses in the visualized lung bases. See chest CT report. Bulky retroperitoneal/periaortic and pelvic sidewall adenopathy. Right inguinal adenopathy. Colonic diverticulosis. Aortic atherosclerosis. Electronically Signed   By: Rolm Baptise M.D.   On: 03/29/2020 03:07    Procedures Procedures (including critical care time)  Medications Ordered in ED Medications - No data to display  ED Course  I have reviewed the triage vital signs and the nursing notes.  Pertinent labs & imaging results that were available during my care of the patient were reviewed by me and considered in my medical decision making (see chart for details).    MDM Rules/Calculators/A&P                         Patient here with 1 month of worsening fatigue, malaise, anorexia, dry heaving, difficulty swallowing, abdominal pain and early satiety.  Concern for worsening B-cell lymphoma after starting treatment last fall.  X-ray of her chest today shows multiple metastatic lesions.  Hemoglobin has dropped 2 g since November.  Hemoglobin continues to downtrend 8.8.  She remains hemodynamically stable.  Hemoccult is positive.  IV Protonix given.  CT scan shows innumerable metastasis throughout lungs. Adenopathy throughout abdomen.  Patient appears to have new melena with worsening hemoglobin.  She is started on PPI.  Her CT scan shows progression of her lymphoma throughout her chest, abdomen and pelvis.  Plan admission for work oncology consultation in the morning as well as further work-up of her GI bleed.  Discussed with Dr. Marlowe Sax.  Final Clinical Impression(s) / ED Diagnoses Final diagnoses:    Metastatic malignant neoplasm, unspecified site [facility])  Gastrointestinal hemorrhage, unspecified gastrointestinal hemorrhage type    Rx / DC Orders ED Discharge Orders    None  Ezequiel Essex, MD 03/29/20 [phone]

## 2020-03-28 NOTE — ED Triage Notes (Signed)
Patient repots to the ER for lethargy. Patient reports to feeling weak and having allergies. Patient states she just has not been feeling right and was trying to see her cancer doctors, she has been in remission since may of 2020.

## 2020-03-29 ENCOUNTER — Encounter (HOSPITAL_COMMUNITY): Payer: Self-pay

## 2020-03-29 ENCOUNTER — Emergency Department (HOSPITAL_COMMUNITY): Payer: Medicare Other

## 2020-03-29 DIAGNOSIS — R630 Anorexia: Secondary | ICD-10-CM | POA: Diagnosis present

## 2020-03-29 DIAGNOSIS — Z833 Family history of diabetes mellitus: Secondary | ICD-10-CM | POA: Diagnosis not present

## 2020-03-29 DIAGNOSIS — I809 Phlebitis and thrombophlebitis of unspecified site: Secondary | ICD-10-CM | POA: Diagnosis present

## 2020-03-29 DIAGNOSIS — N179 Acute kidney failure, unspecified: Secondary | ICD-10-CM | POA: Diagnosis present

## 2020-03-29 DIAGNOSIS — K317 Polyp of stomach and duodenum: Secondary | ICD-10-CM | POA: Diagnosis present

## 2020-03-29 DIAGNOSIS — K2210 Ulcer of esophagus without bleeding: Secondary | ICD-10-CM | POA: Diagnosis not present

## 2020-03-29 DIAGNOSIS — C8590 Non-Hodgkin lymphoma, unspecified, unspecified site: Secondary | ICD-10-CM | POA: Diagnosis present

## 2020-03-29 DIAGNOSIS — Z923 Personal history of irradiation: Secondary | ICD-10-CM | POA: Diagnosis not present

## 2020-03-29 DIAGNOSIS — Z6829 Body mass index (BMI) 29.0-29.9, adult: Secondary | ICD-10-CM | POA: Diagnosis not present

## 2020-03-29 DIAGNOSIS — Z9221 Personal history of antineoplastic chemotherapy: Secondary | ICD-10-CM | POA: Diagnosis not present

## 2020-03-29 DIAGNOSIS — K449 Diaphragmatic hernia without obstruction or gangrene: Secondary | ICD-10-CM | POA: Diagnosis present

## 2020-03-29 DIAGNOSIS — K2971 Gastritis, unspecified, with bleeding: Secondary | ICD-10-CM | POA: Diagnosis present

## 2020-03-29 DIAGNOSIS — I10 Essential (primary) hypertension: Secondary | ICD-10-CM | POA: Diagnosis present

## 2020-03-29 DIAGNOSIS — K264 Chronic or unspecified duodenal ulcer with hemorrhage: Secondary | ICD-10-CM | POA: Diagnosis present

## 2020-03-29 DIAGNOSIS — R1013 Epigastric pain: Secondary | ICD-10-CM | POA: Diagnosis not present

## 2020-03-29 DIAGNOSIS — K2100 Gastro-esophageal reflux disease with esophagitis, without bleeding: Secondary | ICD-10-CM | POA: Diagnosis not present

## 2020-03-29 DIAGNOSIS — K922 Gastrointestinal hemorrhage, unspecified: Secondary | ICD-10-CM | POA: Diagnosis present

## 2020-03-29 DIAGNOSIS — Z20822 Contact with and (suspected) exposure to covid-19: Secondary | ICD-10-CM | POA: Diagnosis present

## 2020-03-29 DIAGNOSIS — K2101 Gastro-esophageal reflux disease with esophagitis, with bleeding: Secondary | ICD-10-CM | POA: Diagnosis present

## 2020-03-29 DIAGNOSIS — Z66 Do not resuscitate: Secondary | ICD-10-CM | POA: Diagnosis present

## 2020-03-29 DIAGNOSIS — R1310 Dysphagia, unspecified: Secondary | ICD-10-CM | POA: Diagnosis present

## 2020-03-29 DIAGNOSIS — K2991 Gastroduodenitis, unspecified, with bleeding: Secondary | ICD-10-CM | POA: Diagnosis present

## 2020-03-29 DIAGNOSIS — K2970 Gastritis, unspecified, without bleeding: Secondary | ICD-10-CM | POA: Diagnosis not present

## 2020-03-29 DIAGNOSIS — D62 Acute posthemorrhagic anemia: Secondary | ICD-10-CM | POA: Diagnosis not present

## 2020-03-29 DIAGNOSIS — E860 Dehydration: Secondary | ICD-10-CM | POA: Diagnosis present

## 2020-03-29 DIAGNOSIS — K254 Chronic or unspecified gastric ulcer with hemorrhage: Secondary | ICD-10-CM | POA: Diagnosis present

## 2020-03-29 DIAGNOSIS — R6881 Early satiety: Secondary | ICD-10-CM | POA: Diagnosis not present

## 2020-03-29 DIAGNOSIS — M7989 Other specified soft tissue disorders: Secondary | ICD-10-CM | POA: Diagnosis not present

## 2020-03-29 DIAGNOSIS — R600 Localized edema: Secondary | ICD-10-CM

## 2020-03-29 DIAGNOSIS — K921 Melena: Secondary | ICD-10-CM | POA: Diagnosis not present

## 2020-03-29 DIAGNOSIS — K259 Gastric ulcer, unspecified as acute or chronic, without hemorrhage or perforation: Secondary | ICD-10-CM | POA: Diagnosis not present

## 2020-03-29 DIAGNOSIS — Z9102 Food additives allergy status: Secondary | ICD-10-CM | POA: Diagnosis not present

## 2020-03-29 DIAGNOSIS — Z79899 Other long term (current) drug therapy: Secondary | ICD-10-CM | POA: Diagnosis not present

## 2020-03-29 DIAGNOSIS — K269 Duodenal ulcer, unspecified as acute or chronic, without hemorrhage or perforation: Secondary | ICD-10-CM | POA: Diagnosis not present

## 2020-03-29 DIAGNOSIS — K2211 Ulcer of esophagus with bleeding: Secondary | ICD-10-CM | POA: Diagnosis present

## 2020-03-29 LAB — FERRITIN: Ferritin: 635 ng/mL — ABNORMAL HIGH (ref 11–307)

## 2020-03-29 LAB — URINALYSIS, ROUTINE W REFLEX MICROSCOPIC
Bilirubin Urine: NEGATIVE
Glucose, UA: NEGATIVE mg/dL
Hgb urine dipstick: NEGATIVE
Ketones, ur: 5 mg/dL — AB
Leukocytes,Ua: NEGATIVE
Nitrite: NEGATIVE
Protein, ur: NEGATIVE mg/dL
Specific Gravity, Urine: >1.046 — ABNORMAL HIGH (ref 1.005–1.030)
pH: 5 (ref 5.0–8.0)

## 2020-03-29 LAB — IRON AND TIBC
Iron: 37 ug/dL (ref 28–170)
Saturation Ratios: 14 % (ref 10.4–31.8)
TIBC: 256 ug/dL (ref 250–450)
UIBC: 219 ug/dL

## 2020-03-29 LAB — SARS CORONAVIRUS 2 BY RT PCR (HOSPITAL ORDER, PERFORMED IN ~~LOC~~ HOSPITAL LAB): SARS Coronavirus 2: NEGATIVE

## 2020-03-29 LAB — HEPATIC FUNCTION PANEL
ALT: 18 U/L (ref 0–44)
AST: 28 U/L (ref 15–41)
Albumin: 3 g/dL — ABNORMAL LOW (ref 3.5–5.0)
Alkaline Phosphatase: 43 U/L (ref 38–126)
Bilirubin, Direct: 0.2 mg/dL (ref 0.0–0.2)
Indirect Bilirubin: 0.6 mg/dL (ref 0.3–0.9)
Total Bilirubin: 0.8 mg/dL (ref 0.3–1.2)
Total Protein: 5.4 g/dL — ABNORMAL LOW (ref 6.5–8.1)

## 2020-03-29 LAB — HEMOGLOBIN AND HEMATOCRIT, BLOOD
HCT: 27.7 % — ABNORMAL LOW (ref 36.0–46.0)
Hemoglobin: 8.8 g/dL — ABNORMAL LOW (ref 12.0–15.0)

## 2020-03-29 LAB — RETICULOCYTES
Immature Retic Fract: 27.4 % — ABNORMAL HIGH (ref 2.3–15.9)
RBC.: 3.06 MIL/uL — ABNORMAL LOW (ref 3.87–5.11)
Retic Count, Absolute: 91.5 10*3/uL (ref 19.0–186.0)
Retic Ct Pct: 3 % (ref 0.4–3.1)

## 2020-03-29 LAB — TROPONIN I (HIGH SENSITIVITY): Troponin I (High Sensitivity): 10 ng/L (ref <18)

## 2020-03-29 LAB — PROTIME-INR
INR: 1.1 (ref 0.8–1.2)
Prothrombin Time: 14 seconds (ref 11.4–15.2)

## 2020-03-29 LAB — TSH: TSH: 3.944 u[IU]/mL (ref 0.350–4.500)

## 2020-03-29 LAB — LIPASE, BLOOD: Lipase: 13 U/L (ref 11–51)

## 2020-03-29 LAB — POC OCCULT BLOOD, ED: Fecal Occult Bld: POSITIVE — AB

## 2020-03-29 MED ORDER — ONDANSETRON HCL 4 MG/2ML IJ SOLN: 4.0000 mg | Freq: Four times a day (QID) | INTRAMUSCULAR | Status: DC | PRN

## 2020-03-29 MED ORDER — IOHEXOL 350 MG/ML SOLN
100.0000 mL | Freq: Once | INTRAVENOUS | Status: AC | PRN
  Administered 2020-03-29: 100 mL via INTRAVENOUS

## 2020-03-29 MED ORDER — ONDANSETRON HCL 4 MG/2ML IJ SOLN
4.0000 mg | Freq: Once | INTRAMUSCULAR | Status: AC
  Administered 2020-03-29: 4 mg via INTRAVENOUS
  Filled 2020-03-29: qty 2

## 2020-03-29 MED ORDER — ACETAMINOPHEN 325 MG PO TABS: 650.0000 mg | ORAL_TABLET | Freq: Four times a day (QID) | ORAL | Status: DC | PRN

## 2020-03-29 MED ORDER — PANTOPRAZOLE SODIUM 40 MG IV SOLR
40.0000 mg | Freq: Two times a day (BID) | INTRAVENOUS | Status: DC
  Administered 2020-03-29 – 2020-03-30 (×3): 40 mg via INTRAVENOUS
  Filled 2020-03-29 (×2): qty 40

## 2020-03-29 MED ORDER — PREDNISOLONE ACETATE 1 % OP SUSP
1.0000 [drp] | Freq: Every day | OPHTHALMIC | Status: DC
  Administered 2020-03-29 – 2020-03-31 (×2): 1 [drp] via OPHTHALMIC
  Filled 2020-03-29 (×2): qty 5

## 2020-03-29 MED ORDER — SODIUM CHLORIDE 0.9 % IV SOLN: INTRAVENOUS | Status: AC

## 2020-03-29 MED ORDER — ACETAMINOPHEN 650 MG RE SUPP: 650.0000 mg | Freq: Four times a day (QID) | RECTAL | Status: DC | PRN

## 2020-03-29 MED ORDER — FLUTICASONE PROPIONATE 50 MCG/ACT NA SUSP
1.0000 | Freq: Every day | NASAL | Status: DC
  Administered 2020-03-29 – 2020-03-31 (×2): 1 via NASAL
  Filled 2020-03-29 (×2): qty 16

## 2020-03-29 MED ORDER — SODIUM CHLORIDE 0.9 % IV BOLUS
1000.0000 mL | Freq: Once | INTRAVENOUS | Status: AC
  Administered 2020-03-29: 1000 mL via INTRAVENOUS

## 2020-03-29 MED ORDER — PANTOPRAZOLE SODIUM 40 MG IV SOLR
40.0000 mg | Freq: Once | INTRAVENOUS | Status: AC
  Administered 2020-03-29: 40 mg via INTRAVENOUS
  Filled 2020-03-29: qty 40

## 2020-03-29 NOTE — ED Notes (Signed)
Patient placed on pure wick °

## 2020-03-29 NOTE — H&P (Signed)
History and Physical    Taylor Huynh GXQ:[id number] DOB: 1941/04/12 DOA: 03/28/2020  PCP: [name], MD Patient coming from: Home  Chief Complaint: Multiple complaints  HPI: Taylor Huynh is a 79 y.o. 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  Patient states she received chemotherapy and radiation for her lymphoma last year.  She then saw her oncologist who told her that she needed a biopsy done to decide what the next course of treatment would be.  States she went for the biopsy but the lady there were told her since she had something to drink, the procedure had to be rescheduled.  States she has not gone back for the biopsy or seen her oncologist since then.  Reports having generalized weakness/fatigue, anorexia, early satiety, weight loss, and dark stools for the past 4 weeks.  2 weeks ago she had some soup and vomited soon after.  Since then she has been vomiting whenever she tries to eat.  She has only been able to tolerate eating very small quantities of food such as a piece of chocolate.  There is no blood in her vomit.  Denies chest pain or abdominal pain.  She is also having some shortness of breath.  She has been vaccinated against Covid.  ED Course: Vital signs stable.  WBC 4.8, hemoglobin 9.2, hematocrit 28.7, platelet 196k.  Hemoglobin 8.8 on repeat labs.  Melenic stool noted on rectal exam in the ED.  Sodium 138, potassium 3.5, chloride 105, bicarb 20, BUN 24, creatinine 1.1, glucose 114.  Baseline creatinine 0.7.  UA pending.  FOBT positive.  High-sensitivity troponin negative.  SARS-CoV-2 PCR test pending.  TSH normal.  INR 1.1.  LFTs normal.  Lipase normal.  Chest x-ray showing multiple bilateral lung masses, greater on the left, most compatible with metastatic disease.  CT angiogram chest showing innumerable bilateral pulmonary nodules and masses most  compatible with metastatic disease.  Borderline size right paratracheal lymph nodes and left hilar lymph nodes.  CT abdomen pelvis showing bulky retroperitoneal/periaortic and pelvic sidewall adenopathy.  Right inguinal adenopathy.  Patient was given Zofran, Protonix, and 1 L fluid bolus.  Review of Systems:  All systems reviewed and apart from history of presenting illness, are negative.  Past Medical History:  Diagnosis Date  . GERD (gastroesophageal reflux disease)   . HH (hiatus hernia) 12/12/2011  . Hypertension   . Leukopenia 12/12/2011  . nhl dx'd 2010  . Seizures ([place]) reports "years ago"  . Syncope 12/12/2011   Hx seizures - not documented - age 74; recent syncope 6/12 and x2, 12/12; negative MRI brain 1/13    Past Surgical History:  Procedure Laterality Date  . COLON SURGERY  2010   due to cancer  . IR IMAGING GUIDED PORT INSERTION  08/23/2018  . IR PATIENT EVAL TECH 0-60 MINS  04/22/2019  . IR PATIENT EVAL TECH 0-60 MINS  04/25/2019  . IR PATIENT EVAL TECH 0-60 MINS  04/28/2019  . IR PATIENT EVAL TECH 0-60 MINS  05/02/2019  . IR REMOVAL TUN ACCESS W/ PORT W/O FL MOD SED  04/18/2019     reports that she has never smoked. She has never used smokeless tobacco. She reports that she does not drink alcohol and does not use drugs.  Allergies  Allergen Reactions  . Strawberry (Diagnostic) Itching  . Tomato Itching    Family History  Problem Relation Age of Onset  . Diabetes  Mother   . Diabetes Father     Prior to Admission medications   Medication Sig Start Date End Date Taking? Authorizing Provider  amLODipine (NORVASC) 10 MG tablet Take 10 mg by mouth every other day.  11/02/11  Yes [provider]  cholecalciferol (VITAMIN D) 1000 UNITS tablet Take 1,000 Units by mouth daily.     Yes [provider]  famotidine (PEPCID) 20 MG tablet Take 20 mg by mouth at bedtime as needed. 03/23/20  Yes [provider]  fluticasone (FLONASE) 50 MCG/ACT nasal  spray Place 1 spray into both nostrils daily. 03/23/20  Yes [provider]  Melatonin 5 MG TABS Take 5 mg by mouth at bedtime.    Yes [provider]  prednisoLONE acetate (PRED FORTE) 1 % ophthalmic suspension Place 1 drop into both eyes daily. 02/17/20  Yes [provider]  vitamin B-12 (CYANOCOBALAMIN) 1000 MCG tablet Take 1,000 mcg by mouth every other day.   Yes [provider]  furosemide (LASIX) 20 MG tablet Take 20 mg by mouth daily. Patient not taking: Reported on 03/29/2020 12/02/19   [provider]  lidocaine-prilocaine (EMLA) cream Apply 1 application topically as needed. 08/28/18   Brunetta Genera, MD    Physical Exam: Vitals:   03/28/20 1524 03/29/20 0217 03/29/20 0444 03/29/20 0538  BP: 131/71 120/74 122/69   Pulse: 94 98 94   Resp: 16 20 (!) 21   Temp: 99.8 F (37.7 C) 99.3 F (37.4 C)  98.6 F (37 C)  TempSrc: Oral Oral  Oral  SpO2: 95% 96% 92%     Physical Exam Constitutional:      General: She is not in acute distress. HENT:     Head: Normocephalic and atraumatic.  Eyes:     Extraocular Movements: Extraocular movements intact.     Conjunctiva/sclera: Conjunctivae normal.  Cardiovascular:     Rate and Rhythm: Normal rate and regular rhythm.     Pulses: Normal pulses.  Pulmonary:     Effort: Pulmonary effort is normal. No respiratory distress.     Breath sounds: No wheezing or rales.  Abdominal:     General: Bowel sounds are normal. There is no distension.     Palpations: Abdomen is soft.     Tenderness: There is no abdominal tenderness. There is no guarding.  Musculoskeletal:        General: Swelling present. No tenderness.     Cervical back: Normal range of motion and neck supple.     Comments: Right lower extremity edematous No edema of left lower extremity  Skin:    General: Skin is warm and dry.  Neurological:     Mental Status: She is alert and oriented to person, place, and time.     Labs on  Admission: I have personally reviewed following labs and imaging studies  CBC: Recent Labs  Lab 03/28/20 1410 03/29/20 0206  WBC 4.8  --   HGB 9.2* 8.8*  HCT 28.7* 27.7*  MCV 89.1  --   PLT 196  --    Basic Metabolic Panel: Recent Labs  Lab 03/28/20 1410  NA 138  K 3.5  CL 105  CO2 20*  GLUCOSE 114*  BUN 24*  CREATININE 1.13*  CALCIUM 9.5   GFR: CrCl cannot be calculated (Unknown ideal weight.). Liver Function Tests: Recent Labs  Lab 03/29/20 0206  AST 28  ALT 18  ALKPHOS 43  BILITOT 0.8  PROT 5.4*  ALBUMIN 3.0*   Recent  Labs  Lab 03/29/20 0206  LIPASE 13   No results for input(s): AMMONIA in the last 168 hours. Coagulation Profile: Recent Labs  Lab 03/29/20 0206  INR 1.1   Cardiac Enzymes: No results for input(s): CKTOTAL, CKMB, CKMBINDEX, TROPONINI in the last 168 hours. BNP (last 3 results) No results for input(s): PROBNP in the last 8760 hours. HbA1C: No results for input(s): HGBA1C in the last 72 hours. CBG: No results for input(s): GLUCAP in the last 168 hours. Lipid Profile: No results for input(s): CHOL, HDL, LDLCALC, TRIG, CHOLHDL, LDLDIRECT in the last 72 hours. Thyroid Function Tests: Recent Labs    03/29/20 0206  TSH 3.944   Anemia Panel: No results for input(s): VITAMINB12, FOLATE, FERRITIN, TIBC, IRON, RETICCTPCT in the last 72 hours. Urine analysis:    Component Value Date/Time   COLORURINE YELLOW 09/12/2015 0926   APPEARANCEUR CLEAR 09/12/2015 0926   LABSPEC 1.008 09/12/2015 0926   PHURINE 6.0 09/12/2015 0926   GLUCOSEU NEGATIVE 09/12/2015 0926   HGBUR NEGATIVE 09/12/2015 0926   BILIRUBINUR NEGATIVE 09/12/2015 0926   KETONESUR NEGATIVE 09/12/2015 0926   PROTEINUR NEGATIVE 09/12/2015 0926   NITRITE NEGATIVE 09/12/2015 0926   LEUKOCYTESUR NEGATIVE 09/12/2015 0926    Radiological Exams on Admission: DG Chest 2 View  Result Date: 03/28/2020 CLINICAL DATA:  Shortness of breath, weakness and lethargy. Non-Hodgkin's  lymphoma. EXAM: CHEST - 2 VIEW COMPARISON:  PET-CT dated 05/14/2019. Chest radiographs dated 06/11/2016. FINDINGS: Interval multiple rounded and oval masses in both lungs, greater on the left. Some of these are pleural based and others appear to be within the lung parenchyma. Otherwise, the lungs are clear. Normal sized heart. Thoracic spine degenerative changes. IMPRESSION: Interval multiple bilateral lung masses, greater on the left, most compatible with metastatic disease and most likely related to the patient's non-Hodgkin's lymphoma. Electronically Signed   By: Claudie Revering M.D.   On: 03/28/2020 14:54   CT Angio Chest PE W and/or Wo Contrast  Result Date: 03/29/2020 CLINICAL DATA:  B-cell lymphoma. Anorexia, vomiting. Bowel obstruction suspected. EXAM: CT ANGIOGRAPHY CHEST WITH CONTRAST TECHNIQUE: Multidetector CT imaging of the chest was performed using the standard protocol during bolus administration of intravenous contrast. Multiplanar CT image reconstructions and MIPs were obtained to evaluate the vascular anatomy. CONTRAST:  171mL OMNIPAQUE IOHEXOL 350 MG/ML SOLN COMPARISON:  Chest CT 02/10/2010.  PET CT 08/22/2018 FINDINGS: Cardiovascular: Heart is normal size. Aorta normal caliber with scattered calcifications. Mediastinum/Nodes: Borderline right paratracheal lymph node with a short axis diameter of 9 mm. No axillary adenopathy. Borderline left hilar lymph nodes with a short axis diameter of 11 mm. Lungs/Pleura: Extensive bilateral pulmonary nodules and masses, most peripherally. Index left lower lobe mass measures 4 cm on image 78. Index right lower lobe mass measures 5 cm on image 87. no effusions. Upper Abdomen: Imaging into the upper abdomen demonstrates no acute findings. Musculoskeletal: Chest wall soft tissues are unremarkable. No acute bony abnormality. Review of the MIP images confirms the above findings. IMPRESSION: Innumerable bilateral pulmonary nodules and masses most compatible with  metastatic disease. Borderline sized right paratracheal lymph nodes and left hilar lymph nodes. Aortic Atherosclerosis (ICD10-I70.0). Electronically Signed   By: Rolm Baptise M.D.   On: 03/29/2020 03:04   CT ABDOMEN PELVIS W CONTRAST  Result Date: 03/29/2020 CLINICAL DATA:  B-cell lymphoma. Bowel obstruction suspected. Anorexia, vomiting EXAM: CT ABDOMEN AND PELVIS WITH CONTRAST TECHNIQUE: Multidetector CT imaging of the abdomen and pelvis was performed using the standard protocol following bolus administration of  intravenous contrast. CONTRAST:  173mL OMNIPAQUE IOHEXOL 350 MG/ML SOLN COMPARISON:  07/18/2018 FINDINGS: Lower chest: Innumerable bilateral pulmonary nodules and masses seen as seen on chest CT today. Heart is normal size. Hepatobiliary: No focal liver abnormality is seen. Status post cholecystectomy. No biliary dilatation. Pancreas: No focal abnormality or ductal dilatation. Spleen: No focal abnormality.  Normal size. Adrenals/Urinary Tract: Bilateral renal cysts. No adrenal mass or hydronephrosis. Urinary bladder decompressed, grossly unremarkable. Stomach/Bowel: Colonic diverticulosis. No active diverticulitis. No evidence of bowel obstruction. Stomach and small bowel decompressed, grossly unremarkable. Vascular/Lymphatic: Aortic atherosclerosis. No aneurysm. Bulky retroperitoneal adenopathy. Conglomerate nodal mass in the left periaortic region measures up to 6.4 cm. Numerous other retroperitoneal periaortic lymph nodes as well as pelvic sidewall/iliac chain lymph nodes bilaterally right pelvic sidewall lymph node on image 69 as a diameter of 4.7 cm. Right inguinal adenopathy. Short axis diameter measures 2.1 cm. Reproductive: Prior hysterectomy.  No adnexal masses. Other: No free fluid or free air. Musculoskeletal: No acute bony abnormality. IMPRESSION: Innumerable bilateral pulmonary nodules and masses in the visualized lung bases. See chest CT report. Bulky retroperitoneal/periaortic and pelvic  sidewall adenopathy. Right inguinal adenopathy. Colonic diverticulosis. Aortic atherosclerosis. Electronically Signed   By: Rolm Baptise M.D.   On: 03/29/2020 03:07    EKG: Independently reviewed.  Sinus tachycardia, PVCs, no acute ischemic changes.  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  Also having nonbloody emesis for the past 2 weeks.  Lipase and LFTs normal.  Hemoglobin 8.8, previously in the 10-11 range.  FOBT positive.  Melenic stool noted on rectal exam done in the ED.  Hemodynamically stable.  Patient has not been on any treatment for her lymphoma since last year.  Last oncology visit was in October 2020. CT angiogram chest showing innumerable bilateral pulmonary nodules and masses most compatible with metastatic disease.  Borderline size right paratracheal lymph nodes and left hilar lymph nodes. CT abdomen pelvis showing bulky retroperitoneal/periaortic and pelvic sidewall adenopathy.  Right inguinal adenopathy. -Keep n.p.o. at this time.  IV Protonix 40 mg twice daily.  Last EGD done in November 2009 showing hiatal hernia; no ulcers, esophagitis, or gastritis. -Type and screen, continue to monitor H&H, transfuse if hemoglobin less than 7 -Antiemetic as needed -Please consult oncology and gastroenterology in a.m.  Unilateral right lower extremity edema -Doppler to rule out DVT  AKI: Likely prerenal due to dehydration in the setting of poor oral intake/vomiting.  BUN 24, creatinine 1.1.  Baseline creatinine 0.7. -IV fluid hydration.  Monitor renal function and urine output.  Avoid nephrotoxic agents.  Hypertension -Currently normotensive.  Hold  antihypertensives at this time due to concern for GI bleed.  GERD -IV PPI as mentioned above  DVT prophylaxis: SCDs Code Status: Patient wishes to be DNR. Family Communication: No family available at this time. Disposition Plan: Status is: Observation  The patient remains OBS appropriate and will d/c before 2 midnights.  Dispo: The patient is from: Home              Anticipated d/c is to: Home              Anticipated d/c date is: 2 days  Patient currently is not medically stable to d/c.  The medical decision making on this patient was of high complexity and the patient is at high risk for clinical deterioration, therefore this is a level 3 visit.  Shela Leff MD [facility]  If 7PM-7AM, please contact night-coverage [email or web address]  03/29/2020, 6:02 AM

## 2020-03-29 NOTE — Progress Notes (Signed)
79 year old lady with advanced stage IV lymphoma was admitted early morning with multiple complaints.  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  Also had a unilateral right lower extremity edema for which Doppler lower extremity was ordered and is still pending.  Came in with mild AKI.  Labs this morning are not done.  I consulted GI as well as oncology this morning.  GI plans to do EGD in the morning.  She was seen by oncologist Dr. Lorenso Courier who does not recommend any further work-up as inpatient and has recommended outpatient follow-up.  I have seen patient personally.  She has no other complaint.  Had some nausea but no vomiting.  Has had more bowel movements which are black according to patient.  No abdominal pain.  Exam is completely benign.

## 2020-03-29 NOTE — Consult Note (Signed)
[place] [phone]   [phone]  INITIAL CONSULT NOTE  Patient Care Team: [name], MD as PCP - General (Internal Medicine)  CHIEF COMPLAINTS/PURPOSE OF CONSULTATION:  "Stagve IV DLBCL, not currently undergoing treatment "  HISTORY OF PRESENTING ILLNESS:  Taylor Huynh 79 y.o. 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 Irene Limbo on 05/22/2019 who presents with generalized weakness/fatigue, anorexia, early satiety, weight loss, and dark stools for the past 4 weeks.  On review of the previous records Taylor Huynh previously underwent treatment for high grade lymphoma with EPOCH-R in late 2010/early 2011 under the care of Dr. Irene Limbo. Subsequently she was seen on 05/21/2020 after a PET CT scan on 05/14/2019 showed concern for progressive bilateral pelvic nodes. A biopsy was arranged, but since the patient had a drink prior to the procedure it could not be performed. The patient noted she did not wish to pursue biopsy or see oncology after that point. She notes presents with numerous symptoms, including generalized weakness/fatigue, anorexia, early satiety, weight loss, and dark stools for the past 4 weeks. Initial labs showed WBC 4.8, Hgb 9.2, Plt 196. CT C/A/P shows innumerable bilateral pulmonary nodules and bulky retroperitoneal/periaortic and pelvic sidewall adenopathy. Due to concern for these findings oncology was consulted for further evaluation and management.   On exam today Taylor Huynh notes she has had numerous issues in her health which is caused to come to the hospital.  She reports that she is having weakness, very poor appetite and having a mouthful of phlegm in the morning.  She notes that she thought some of the symptoms were due to acid reflux, but when she presented to her PCP he was noted to have  a 13 pound weight loss they wanted her to see her primary oncologist for further evaluation.  She does endorse having dark stools in addition to her heartburn-like symptoms.  On further discussion she reports that she wants "no more chemotherapy".  She notes that she would be open to other treatment options if they were available, and seems her biggest concern with chemotherapy was being in the hospital for 5 days and how she felt afterwards.  She currently denies having any issues with fevers, chills, sweats, nausea, vomiting, or diarrhea.  A full 10 point ROS is listed below.  MEDICAL HISTORY:  Past Medical History:  Diagnosis Date  . GERD (gastroesophageal reflux disease)   . HH (hiatus hernia) 12/12/2011  . Hypertension   . Leukopenia 12/12/2011  . nhl dx'd 2010  . Seizures ([place]) reports "years ago"  . Syncope 12/12/2011   Hx seizures - not documented - age 2; recent syncope 6/12 and x2, 12/12; negative MRI brain 1/13    SURGICAL HISTORY: Past Surgical History:  Procedure Laterality Date  . COLON SURGERY  2010   due to cancer  . IR IMAGING GUIDED PORT INSERTION  08/23/2018  . IR PATIENT EVAL TECH 0-60 MINS  04/22/2019  . IR PATIENT EVAL TECH 0-60 MINS  04/25/2019  . IR PATIENT EVAL TECH 0-60 MINS  04/28/2019  . IR PATIENT EVAL TECH 0-60 MINS  05/02/2019  . IR REMOVAL TUN ACCESS W/ PORT W/O FL MOD SED  04/18/2019    SOCIAL HISTORY: Social History   Socioeconomic History  . Marital status: Married    Spouse name: Not on file  . Number of children:  Not on file  . Years of education: Not on file  . Highest education level: Not on file  Occupational History  . Not on file  Tobacco Use  . Smoking status: Never Smoker  . Smokeless tobacco: Never Used  Vaping Use  . Vaping Use: Never used  Substance and Sexual Activity  . Alcohol use: No  . Drug use: No  . Sexual activity: Not Currently  Other Topics Concern  . Not on file  Social History Narrative  . Not on file   Social  Determinants of Health   Financial Resource Strain:   . Difficulty of Paying Living Expenses: Not on file  Food Insecurity:   . Worried About Charity fundraiser in the Last Year: Not on file  . Ran Out of Food in the Last Year: Not on file  Transportation Needs:   . Lack of Transportation (Medical): Not on file  . Lack of Transportation (Non-Medical): Not on file  Physical Activity:   . Days of Exercise per Week: Not on file  . Minutes of Exercise per Session: Not on file  Stress:   . Feeling of Stress : Not on file  Social Connections:   . Frequency of Communication with Friends and Family: Not on file  . Frequency of Social Gatherings with Friends and Family: Not on file  . Attends Religious Services: Not on file  . Active Member of Clubs or Organizations: Not on file  . Attends Archivist Meetings: Not on file  . Marital Status: Not on file  Intimate Partner Violence:   . Fear of Current or Ex-Partner: Not on file  . Emotionally Abused: Not on file  . Physically Abused: Not on file  . Sexually Abused: Not on file    FAMILY HISTORY: Family History  Problem Relation Age of Onset  . Diabetes Mother   . Diabetes Father     ALLERGIES:  is allergic to strawberry (diagnostic) and tomato.  MEDICATIONS:  Current Facility-Administered Medications  Medication Dose Route Frequency Provider Last Rate Last Admin  . 0.9 %  sodium chloride infusion   Intravenous Continuous Shela Leff, MD 125 mL/hr at 03/29/20 0620 New Bag at 03/29/20 1194  . acetaminophen (TYLENOL) tablet 650 mg  650 mg Oral Q6H PRN Shela Leff, MD       Or  . acetaminophen (TYLENOL) suppository 650 mg  650 mg Rectal Q6H PRN Shela Leff, MD      . fluticasone (FLONASE) 50 MCG/ACT nasal spray 1 spray  1 spray Each Nare Daily Shela Leff, MD   1 spray at 03/29/20 1055  . ondansetron (ZOFRAN) injection 4 mg  4 mg Intravenous Q6H PRN Shela Leff, MD      . pantoprazole  (PROTONIX) injection 40 mg  40 mg Intravenous Q12H Shela Leff, MD      . prednisoLONE acetate (PRED FORTE) 1 % ophthalmic suspension 1 drop  1 drop Both Eyes Daily Shela Leff, MD        REVIEW OF SYSTEMS:   Constitutional: ( - ) fevers, ( - )  chills , ( - ) night sweats Eyes: ( - ) blurriness of vision, ( - ) double vision, ( - ) watery eyes Ears, nose, mouth, throat, and face: ( - ) mucositis, ( + ) sore throat Respiratory: ( - ) cough, ( - ) dyspnea, ( - ) wheezes Cardiovascular: ( - ) palpitation, ( - ) chest discomfort, ( - ) lower extremity swelling Gastrointestinal:  ( - )  nausea, ( + ) heartburn, ( - ) change in bowel habits Skin: ( - ) abnormal skin rashes Lymphatics: ( - ) new lymphadenopathy, ( - ) easy bruising Neurological: ( - ) numbness, ( - ) tingling, ( - ) new weaknesses Behavioral/Psych: ( - ) mood change, ( - ) new changes  All other systems were reviewed with the patient and are negative.  PHYSICAL EXAMINATION: ECOG PERFORMANCE STATUS: 2 - Symptomatic, <50% confined to bed  Vitals:   03/29/20 0611 03/29/20 0953  BP: 122/60 113/65  Pulse: 94 91  Resp: 18 16  Temp: 98.3 F (36.8 C) 98.3 F (36.8 C)  SpO2: 96% 100%   Filed Weights   03/29/20 7893  Weight: 174 lb (78.9 kg)    GENERAL: well appearing elderly African American female in NAD  SKIN: skin color, texture, turgor are normal, no rashes or significant lesions EYES: conjunctiva are pink and non-injected, sclera clear LUNGS: clear to auscultation and percussion with normal breathing effort HEART: regular rate & rhythm and no murmurs and no lower extremity edema Musculoskeletal: no cyanosis of digits and no clubbing  PSYCH: alert & oriented x 3, fluent speech NEURO: no focal motor/sensory deficits  LABORATORY DATA:  I have reviewed the data as listed CBC Latest Ref Rng & Units 03/29/2020 03/28/2020 05/29/2019  WBC 4.0 - 10.5 K/uL - 4.8 2.1(L)  Hemoglobin 12.0 - 15.0 g/dL 8.8(L) 9.2(L)  11.2(L)  Hematocrit 36 - 46 % 27.7(L) 28.7(L) 35.9(L)  Platelets 150 - 400 K/uL - 196 236    CMP Latest Ref Rng & Units 03/29/2020 03/28/2020 05/22/2019  Glucose 70 - 99 mg/dL - 114(H) 91  BUN 8 - 23 mg/dL - 24(H) 19  Creatinine 0.44 - 1.00 mg/dL - 1.13(H) 0.79  Sodium 135 - 145 mmol/L - 138 141  Potassium 3.5 - 5.1 mmol/L - 3.5 4.1  Chloride 98 - 111 mmol/L - 105 108  CO2 22 - 32 mmol/L - 20(L) 25  Calcium 8.9 - 10.3 mg/dL - 9.5 8.6(L)  Total Protein 6.5 - 8.1 g/dL 5.4(L) - 6.4(L)  Total Bilirubin 0.3 - 1.2 mg/dL 0.8 - <0.2(L)  Alkaline Phos 38 - 126 U/L 43 - 73  AST 15 - 41 U/L 28 - 14(L)  ALT 0 - 44 U/L 18 - 12     RADIOGRAPHIC STUDIES: DG Chest 2 View  Result Date: 03/28/2020 CLINICAL DATA:  Shortness of breath, weakness and lethargy. Non-Hodgkin's lymphoma. EXAM: CHEST - 2 VIEW COMPARISON:  PET-CT dated 05/14/2019. Chest radiographs dated 06/11/2016. FINDINGS: Interval multiple rounded and oval masses in both lungs, greater on the left. Some of these are pleural based and others appear to be within the lung parenchyma. Otherwise, the lungs are clear. Normal sized heart. Thoracic spine degenerative changes. IMPRESSION: Interval multiple bilateral lung masses, greater on the left, most compatible with metastatic disease and most likely related to the patient's non-Hodgkin's lymphoma. Electronically Signed   By: Claudie Revering M.D.   On: 03/28/2020 14:54   CT Angio Chest PE W and/or Wo Contrast  Result Date: 03/29/2020 CLINICAL DATA:  B-cell lymphoma. Anorexia, vomiting. Bowel obstruction suspected. EXAM: CT ANGIOGRAPHY CHEST WITH CONTRAST TECHNIQUE: Multidetector CT imaging of the chest was performed using the standard protocol during bolus administration of intravenous contrast. Multiplanar CT image reconstructions and MIPs were obtained to evaluate the vascular anatomy. CONTRAST:  175mL OMNIPAQUE IOHEXOL 350 MG/ML SOLN COMPARISON:  Chest CT 02/10/2010.  PET CT 08/22/2018 FINDINGS:  Cardiovascular: Heart is normal size. Aorta  normal caliber with scattered calcifications. Mediastinum/Nodes: Borderline right paratracheal lymph node with a short axis diameter of 9 mm. No axillary adenopathy. Borderline left hilar lymph nodes with a short axis diameter of 11 mm. Lungs/Pleura: Extensive bilateral pulmonary nodules and masses, most peripherally. Index left lower lobe mass measures 4 cm on image 78. Index right lower lobe mass measures 5 cm on image 87. no effusions. Upper Abdomen: Imaging into the upper abdomen demonstrates no acute findings. Musculoskeletal: Chest wall soft tissues are unremarkable. No acute bony abnormality. Review of the MIP images confirms the above findings. IMPRESSION: Innumerable bilateral pulmonary nodules and masses most compatible with metastatic disease. Borderline sized right paratracheal lymph nodes and left hilar lymph nodes. Aortic Atherosclerosis (ICD10-I70.0). Electronically Signed   By: Rolm Baptise M.D.   On: 03/29/2020 03:04   CT ABDOMEN PELVIS W CONTRAST  Result Date: 03/29/2020 CLINICAL DATA:  B-cell lymphoma. Bowel obstruction suspected. Anorexia, vomiting EXAM: CT ABDOMEN AND PELVIS WITH CONTRAST TECHNIQUE: Multidetector CT imaging of the abdomen and pelvis was performed using the standard protocol following bolus administration of intravenous contrast. CONTRAST:  160mL OMNIPAQUE IOHEXOL 350 MG/ML SOLN COMPARISON:  07/18/2018 FINDINGS: Lower chest: Innumerable bilateral pulmonary nodules and masses seen as seen on chest CT today. Heart is normal size. Hepatobiliary: No focal liver abnormality is seen. Status post cholecystectomy. No biliary dilatation. Pancreas: No focal abnormality or ductal dilatation. Spleen: No focal abnormality.  Normal size. Adrenals/Urinary Tract: Bilateral renal cysts. No adrenal mass or hydronephrosis. Urinary bladder decompressed, grossly unremarkable. Stomach/Bowel: Colonic diverticulosis. No active diverticulitis. No evidence of  bowel obstruction. Stomach and small bowel decompressed, grossly unremarkable. Vascular/Lymphatic: Aortic atherosclerosis. No aneurysm. Bulky retroperitoneal adenopathy. Conglomerate nodal mass in the left periaortic region measures up to 6.4 cm. Numerous other retroperitoneal periaortic lymph nodes as well as pelvic sidewall/iliac chain lymph nodes bilaterally right pelvic sidewall lymph node on image 69 as a diameter of 4.7 cm. Right inguinal adenopathy. Short axis diameter measures 2.1 cm. Reproductive: Prior hysterectomy.  No adnexal masses. Other: No free fluid or free air. Musculoskeletal: No acute bony abnormality. IMPRESSION: Innumerable bilateral pulmonary nodules and masses in the visualized lung bases. See chest CT report. Bulky retroperitoneal/periaortic and pelvic sidewall adenopathy. Right inguinal adenopathy. Colonic diverticulosis. Aortic atherosclerosis. Electronically Signed   By: Rolm Baptise M.D.   On: 03/29/2020 03:07    ASSESSMENT & PLAN Taylor Huynh 79 y.o. 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 Irene Limbo on 05/22/2019 who presents with generalized weakness/fatigue, anorexia, early satiety, weight loss, and dark stools for the past 4 weeks.  To review the labs, review the records, discussion with the patient her findings most consistent with a recurrent lymphoma.  Today the patient stated "I do not want any more chemotherapy".  On further discussion she did report that she would be a open to other treatment options if they were available, including less intense regimens.  At this time I do not think there is an indication for further imaging or biopsy studies if the patient does not wish to pursue treatment.  I would recommend continuing the evaluation for her potential GI bleed while in the inpatient setting.  The oncological work-up and discussions  do not have to occur while she is in house and therefore this is not a barrier to discharge.  The patient and her daughter voiced their understanding of the  radiographic findings and the serious nature of this apparent extensive spread of her lymphoma.  #Recurrent Stage IV Diffuse Large B Cell Lymphoma -- patient notes " I do not want any more chemotherapy". Patient reports she had difficulty with the prior regimen of R-EPOCH, but would be open to hearing about other treatment regimens. Additional options will need to be discussed with the patient's primary oncologist, Dr. Irene Limbo --will hold on additional imaging/biopsy as the patient is unsure if she would want to pursue treatment.  --further workup and evaluation for this patient from an oncological standpoint does not require admission. This can be pursued in the outpatient setting.  --began discussions regarding goals of care. Patient is already a DNR and reports that she "fought a good fight" and would be ready for hospice/comfort based care if that was her best option.   #Dark stools #Anemia --please order iron, TIBC, and ferritin. Additionally please collect a reticulocyte count --agree with GI evaluation for consideration of endoscopy/colonoscopy. --continue daily CBCs with active type and cross.   All questions were answered. The patient knows to call the clinic with any problems, questions or concerns.  A total of more than 50 minutes were spent on this encounter and over half of that time was spent on counseling and coordination of care as outlined above.   Ledell Peoples, MD Department of Hematology/Oncology [facility] Phone: [phone] Pager: [phone] Email: [email or web address]  03/29/2020 11:07 AM

## 2020-03-29 NOTE — H&P (View-Only) (Signed)
Consultation  Referring Provider:  [facility] / [facility] Primary Care Physician:  [name], MD Primary Gastroenterologist:  Oretha Caprice, MD  Reason for Consultation: Melena, anemia  HPI: Taylor Huynh is a 79 y.o. female, with history of high-grade non-Hodgkin's lymphoma stage IV, initially diagnosed in 2010, and patient had undergone bowel resection at that time.  She has been followed by oncology and had chemotherapy and radiation in 2020. Also with history of hypertension and GERD. She presented to the emergency room yesterday with complaints of progressive weakness anorexia and some nausea and occasional vomiting at home over the past couple of weeks.  She has been noticing dark stools over the past month without any overt bleeding. Last PET scan done October 2020 had shown bilateral pelvic adenopathy new and progressive. Work-up through the emergency room yesterday with chest x-ray showing multiple bilateral lung masses left lung greater than right. CT angio showed innumerable pulmonary nodules and masses consistent with metastatic disease.  CT of the abdomen pelvis showed bulky retroperitoneal and periaortic and pelvic adenopathy.  Diverticulosis. On admit WBC 4.8/hemoglobin 9.2 platelets 196 BUN 24/creatinine 1.13. Hemoglobin down to 8.8 this a.m.  Reviewing prior labs hemoglobin was 11.2 in November 2020. Patient relates that she knows that her cancer is progressing and figured that her current symptoms are coming from the cancer.  She wants to be treated but does not think that she could go through any more chemotherapy.  Very remote EGD and colonoscopy 2009 per Dr. Ardis Hughs with EGD showing a 4 cm hiatal hernia and colonoscopy showing pandiverticulosis.  No regular aspirin or NSAIDs, no blood thinners   Past Medical History:  Diagnosis Date  . GERD (gastroesophageal reflux disease)   . HH (hiatus hernia) 12/12/2011  . Hypertension   . Leukopenia 12/12/2011  . nhl  dx'd 2010  . Seizures ([place]) reports "years ago"  . Syncope 12/12/2011   Hx seizures - not documented - age 23; recent syncope 6/12 and x2, 12/12; negative MRI brain 1/13    Past Surgical History:  Procedure Laterality Date  . COLON SURGERY  2010   due to cancer  . IR IMAGING GUIDED PORT INSERTION  08/23/2018  . IR PATIENT EVAL TECH 0-60 MINS  04/22/2019  . IR PATIENT EVAL TECH 0-60 MINS  04/25/2019  . IR PATIENT EVAL TECH 0-60 MINS  04/28/2019  . IR PATIENT EVAL TECH 0-60 MINS  05/02/2019  . IR REMOVAL TUN ACCESS W/ PORT W/O FL MOD SED  04/18/2019    Prior to Admission medications   Medication Sig Start Date End Date Taking? Authorizing Provider  amLODipine (NORVASC) 10 MG tablet Take 10 mg by mouth every other day.  11/02/11  Yes [provider]  cholecalciferol (VITAMIN D) 1000 UNITS tablet Take 1,000 Units by mouth daily.     Yes [provider]  famotidine (PEPCID) 20 MG tablet Take 20 mg by mouth at bedtime as needed. 03/23/20  Yes [provider]  fluticasone (FLONASE) 50 MCG/ACT nasal spray Place 1 spray into both nostrils daily. 03/23/20  Yes [provider]  Melatonin 5 MG TABS Take 5 mg by mouth at bedtime.    Yes [provider]  prednisoLONE acetate (PRED FORTE) 1 % ophthalmic suspension Place 1 drop into both eyes daily. 02/17/20  Yes [provider]  vitamin B-12 (CYANOCOBALAMIN) 1000 MCG tablet Take 1,000 mcg by mouth every other day.   Yes [provider]  furosemide (LASIX) 20 MG tablet Take  20 mg by mouth daily. Patient not taking: Reported on 03/29/2020 12/02/19   [provider]  lidocaine-prilocaine (EMLA) cream Apply 1 application topically as needed. 08/28/18   Brunetta Genera, MD    Current Facility-Administered Medications  Medication Dose Route Frequency Provider Last Rate Last Admin  . 0.9 %  sodium chloride infusion   Intravenous Continuous Shela Leff, MD 125 mL/hr at 03/29/20 0620 New  Bag at 03/29/20 8144  . acetaminophen (TYLENOL) tablet 650 mg  650 mg Oral Q6H PRN Shela Leff, MD       Or  . acetaminophen (TYLENOL) suppository 650 mg  650 mg Rectal Q6H PRN Shela Leff, MD      . fluticasone (FLONASE) 50 MCG/ACT nasal spray 1 spray  1 spray Each Nare Daily Shela Leff, MD      . ondansetron (ZOFRAN) injection 4 mg  4 mg Intravenous Q6H PRN Shela Leff, MD      . pantoprazole (PROTONIX) injection 40 mg  40 mg Intravenous Q12H Shela Leff, MD      . prednisoLONE acetate (PRED FORTE) 1 % ophthalmic suspension 1 drop  1 drop Both Eyes Daily Shela Leff, MD        Allergies as of 03/28/2020  . (No Known Allergies)    Family History  Problem Relation Age of Onset  . Diabetes Mother   . Diabetes Father     Social History   Socioeconomic History  . Marital status: Married    Spouse name: Not on file  . Number of children: Not on file  . Years of education: Not on file  . Highest education level: Not on file  Occupational History  . Not on file  Tobacco Use  . Smoking status: Never Smoker  . Smokeless tobacco: Never Used  Vaping Use  . Vaping Use: Never used  Substance and Sexual Activity  . Alcohol use: No  . Drug use: No  . Sexual activity: Not Currently  Other Topics Concern  . Not on file  Social History Narrative  . Not on file   Social Determinants of Health   Financial Resource Strain:   . Difficulty of Paying Living Expenses: Not on file  Food Insecurity:   . Worried About Charity fundraiser in the Last Year: Not on file  . Ran Out of Food in the Last Year: Not on file  Transportation Needs:   . Lack of Transportation (Medical): Not on file  . Lack of Transportation (Non-Medical): Not on file  Physical Activity:   . Days of Exercise per Week: Not on file  . Minutes of Exercise per Session: Not on file  Stress:   . Feeling of Stress : Not on file  Social Connections:   . Frequency of  Communication with Friends and Family: Not on file  . Frequency of Social Gatherings with Friends and Family: Not on file  . Attends Religious Services: Not on file  . Active Member of Clubs or Organizations: Not on file  . Attends Archivist Meetings: Not on file  . Marital Status: Not on file  Intimate Partner Violence:   . Fear of Current or Ex-Partner: Not on file  . Emotionally Abused: Not on file  . Physically Abused: Not on file  . Sexually Abused: Not on file    Review of Systems: Pertinent positive and negative review of systems were noted in the above HPI section.  All other review of systems was otherwise negative.  Physical Exam: Vital signs in last 24 hours: Temp:  [98.1 F (36.7 C)-99.8 F (37.7 C)] 98.3 F (36.8 C) (09/06 0611) Pulse Rate:  [94-98] 94 (09/06 0611) Resp:  [16-21] 18 (09/06 0611) BP: (120-135)/(60-74) 122/60 (09/06 0611) SpO2:  [92 %-97 %] 96 % (09/06 0611) Weight:  [78.9 kg] 78.9 kg (09/06 0712) Last BM Date:  (PTA) General:   Alert,  Well-developed, well-nourished, elderly African-American female pleasant and cooperative in NAD Head:  Normocephalic and atraumatic. Eyes:  Sclera clear, no icterus.   Conjunctiva pale. Ears:  Normal auditory acuity. Nose:  No deformity, discharge,  or lesions. Mouth:  No deformity or lesions.   Neck:  Supple; no masses or thyromegaly. Lungs:  Clear throughout to auscultation.   No wheezes, crackles, or rhonchi.  Heart:  Regular rate and rhythm; no murmurs, clicks, rubs,  or gallops. Abdomen:  Soft,nontender, BS active,nonpalp mass or hsm.  Midline incisional scars Rectal:  Deferred -documented heme positive Msk:  Symmetrical without gross deformities. . Pulses:  Normal pulses noted. Extremities:  Without clubbing or edema. Neurologic:  Alert and  oriented x4;  grossly normal neurologically. Skin:  Intact without significant lesions or rashes.. Psych:  Alert and cooperative. Normal mood and  affect.  Intake/Output from previous day: 09/05 0701 - 09/06 0700 In: 1036.2 [I.V.:36.2; IV Piggyback:1000] Out: -  Intake/Output this shift: No intake/output data recorded.  Lab Results: Recent Labs    03/28/20 1410 03/29/20 0206  WBC 4.8  --   HGB 9.2* 8.8*  HCT 28.7* 27.7*  PLT 196  --    BMET Recent Labs    03/28/20 1410  NA 138  K 3.5  CL 105  CO2 20*  GLUCOSE 114*  BUN 24*  CREATININE 1.13*  CALCIUM 9.5   LFT Recent Labs    03/29/20 0206  PROT 5.4*  ALBUMIN 3.0*  AST 28  ALT 18  ALKPHOS 43  BILITOT 0.8  BILIDIR 0.2  IBILI 0.6   PT/INR Recent Labs    03/29/20 0206  LABPROT 14.0  INR 1.1     IMPRESSION:  #36 79 year old African-American female with known high-grade non-Hodgkin's lymphoma stage IV, presenting yesterday with progressive weakness, anorexia, nausea and some episodes of vomiting over the past few weeks.  She had been noticing dark stools over the past month and was documented heme positive. Work-up on admission consistent with diffuse metastatic disease with significant adenopathy in the chest and abdomen.  Etiology of melena is not clear, rule out gastric involvement with lymphoma though no abnormality noted on CT.  Rule out gastric ulcer, esophagitis, duodenal ulcer, metastatic disease  #2 normocytic anemia secondary to above #3 GERD #4 history of hypertension #5 status post remote bowel resection 2010 at initial diagnosis of lymphoma, required temporary colostomy  Plan:  Full liquid diet today, n.p.o. after midnight Patient has been scheduled for upper endoscopy with Dr. Bryan Lemma in a.m. tomorrow.  Procedure was discussed in detail with patient including indications risks benefits and she is agreeable to proceed. Continue to trend hemoglobin and transfuse for hemoglobin less than 7.5 Continue twice daily PPI Continue as needed IV Zofran  Oncology will need to be consulted tomorrow Thank you will follow with you   Amy  EsterwoodPA-C  03/29/2020, 8:59 AM     Attending Physician Note   I have taken a history, examined the patient and reviewed the chart. I agree with the Advanced Practitioner's note, impression and recommendations.   Melena, worsening anemia, in patient with  stage IV high grade non-Hodgkin's lymphoma. R/O UGI tract involvement with lymphoma, ulcer, esophagitis, AVM, etc. PPI bid  Trend CBC  EGD tomorrow at 0815 with Dr. Arbie Cookey, MD [facility]

## 2020-03-29 NOTE — ED Notes (Signed)
Pt family would like updates from test results Taylor Huynh [phone];    Magdalene Tardiff [phone]  [phone]. Daughters of Pt call Rodena Piety or Taylor first

## 2020-03-29 NOTE — Consult Note (Addendum)
Consultation  Referring Provider:  [facility] / [facility] Primary Care Physician:  [name], MD Primary Gastroenterologist:  Oretha Caprice, MD  Reason for Consultation: Melena, anemia  HPI: Taylor Huynh is a 79 y.o. female, with history of high-grade non-Hodgkin's lymphoma stage IV, initially diagnosed in 2010, and patient had undergone bowel resection at that time.  She has been followed by oncology and had chemotherapy and radiation in 2020. Also with history of hypertension and GERD. She presented to the emergency room yesterday with complaints of progressive weakness anorexia and some nausea and occasional vomiting at home over the past couple of weeks.  She has been noticing dark stools over the past month without any overt bleeding. Last PET scan done October 2020 had shown bilateral pelvic adenopathy new and progressive. Work-up through the emergency room yesterday with chest x-ray showing multiple bilateral lung masses left lung greater than right. CT angio showed innumerable pulmonary nodules and masses consistent with metastatic disease.  CT of the abdomen pelvis showed bulky retroperitoneal and periaortic and pelvic adenopathy.  Diverticulosis. On admit WBC 4.8/hemoglobin 9.2 platelets 196 BUN 24/creatinine 1.13. Hemoglobin down to 8.8 this a.m.  Reviewing prior labs hemoglobin was 11.2 in November 2020. Patient relates that she knows that her cancer is progressing and figured that her current symptoms are coming from the cancer.  She wants to be treated but does not think that she could go through any more chemotherapy.  Very remote EGD and colonoscopy 2009 per Dr. Ardis Hughs with EGD showing a 4 cm hiatal hernia and colonoscopy showing pandiverticulosis.  No regular aspirin or NSAIDs, no blood thinners   Past Medical History:  Diagnosis Date  . GERD (gastroesophageal reflux disease)   . HH (hiatus hernia) 12/12/2011  . Hypertension   . Leukopenia 12/12/2011  . nhl  dx'd 2010  . Seizures ([place]) reports "years ago"  . Syncope 12/12/2011   Hx seizures - not documented - age 47; recent syncope 6/12 and x2, 12/12; negative MRI brain 1/13    Past Surgical History:  Procedure Laterality Date  . COLON SURGERY  2010   due to cancer  . IR IMAGING GUIDED PORT INSERTION  08/23/2018  . IR PATIENT EVAL TECH 0-60 MINS  04/22/2019  . IR PATIENT EVAL TECH 0-60 MINS  04/25/2019  . IR PATIENT EVAL TECH 0-60 MINS  04/28/2019  . IR PATIENT EVAL TECH 0-60 MINS  05/02/2019  . IR REMOVAL TUN ACCESS W/ PORT W/O FL MOD SED  04/18/2019    Prior to Admission medications   Medication Sig Start Date End Date Taking? Authorizing Provider  amLODipine (NORVASC) 10 MG tablet Take 10 mg by mouth every other day.  11/02/11  Yes [provider]  cholecalciferol (VITAMIN D) 1000 UNITS tablet Take 1,000 Units by mouth daily.     Yes [provider]  famotidine (PEPCID) 20 MG tablet Take 20 mg by mouth at bedtime as needed. 03/23/20  Yes [provider]  fluticasone (FLONASE) 50 MCG/ACT nasal spray Place 1 spray into both nostrils daily. 03/23/20  Yes [provider]  Melatonin 5 MG TABS Take 5 mg by mouth at bedtime.    Yes [provider]  prednisoLONE acetate (PRED FORTE) 1 % ophthalmic suspension Place 1 drop into both eyes daily. 02/17/20  Yes [provider]  vitamin B-12 (CYANOCOBALAMIN) 1000 MCG tablet Take 1,000 mcg by mouth every other day.   Yes [provider]  furosemide (LASIX) 20 MG tablet Take  20 mg by mouth daily. Patient not taking: Reported on 03/29/2020 12/02/19   [provider]  lidocaine-prilocaine (EMLA) cream Apply 1 application topically as needed. 08/28/18   Brunetta Genera, MD    Current Facility-Administered Medications  Medication Dose Route Frequency Provider Last Rate Last Admin  . 0.9 %  sodium chloride infusion   Intravenous Continuous Shela Leff, MD 125 mL/hr at 03/29/20 0620 New  Bag at 03/29/20 4696  . acetaminophen (TYLENOL) tablet 650 mg  650 mg Oral Q6H PRN Shela Leff, MD       Or  . acetaminophen (TYLENOL) suppository 650 mg  650 mg Rectal Q6H PRN Shela Leff, MD      . fluticasone (FLONASE) 50 MCG/ACT nasal spray 1 spray  1 spray Each Nare Daily Shela Leff, MD      . ondansetron (ZOFRAN) injection 4 mg  4 mg Intravenous Q6H PRN Shela Leff, MD      . pantoprazole (PROTONIX) injection 40 mg  40 mg Intravenous Q12H Shela Leff, MD      . prednisoLONE acetate (PRED FORTE) 1 % ophthalmic suspension 1 drop  1 drop Both Eyes Daily Shela Leff, MD        Allergies as of 03/28/2020  . (No Known Allergies)    Family History  Problem Relation Age of Onset  . Diabetes Mother   . Diabetes Father     Social History   Socioeconomic History  . Marital status: Married    Spouse name: Not on file  . Number of children: Not on file  . Years of education: Not on file  . Highest education level: Not on file  Occupational History  . Not on file  Tobacco Use  . Smoking status: Never Smoker  . Smokeless tobacco: Never Used  Vaping Use  . Vaping Use: Never used  Substance and Sexual Activity  . Alcohol use: No  . Drug use: No  . Sexual activity: Not Currently  Other Topics Concern  . Not on file  Social History Narrative  . Not on file   Social Determinants of Health   Financial Resource Strain:   . Difficulty of Paying Living Expenses: Not on file  Food Insecurity:   . Worried About Charity fundraiser in the Last Year: Not on file  . Ran Out of Food in the Last Year: Not on file  Transportation Needs:   . Lack of Transportation (Medical): Not on file  . Lack of Transportation (Non-Medical): Not on file  Physical Activity:   . Days of Exercise per Week: Not on file  . Minutes of Exercise per Session: Not on file  Stress:   . Feeling of Stress : Not on file  Social Connections:   . Frequency of  Communication with Friends and Family: Not on file  . Frequency of Social Gatherings with Friends and Family: Not on file  . Attends Religious Services: Not on file  . Active Member of Clubs or Organizations: Not on file  . Attends Archivist Meetings: Not on file  . Marital Status: Not on file  Intimate Partner Violence:   . Fear of Current or Ex-Partner: Not on file  . Emotionally Abused: Not on file  . Physically Abused: Not on file  . Sexually Abused: Not on file    Review of Systems: Pertinent positive and negative review of systems were noted in the above HPI section.  All other review of systems was otherwise negative.  Physical Exam: Vital signs in last 24 hours: Temp:  [98.1 F (36.7 C)-99.8 F (37.7 C)] 98.3 F (36.8 C) (09/06 0611) Pulse Rate:  [94-98] 94 (09/06 0611) Resp:  [16-21] 18 (09/06 0611) BP: (120-135)/(60-74) 122/60 (09/06 0611) SpO2:  [92 %-97 %] 96 % (09/06 0611) Weight:  [78.9 kg] 78.9 kg (09/06 0712) Last BM Date:  (PTA) General:   Alert,  Well-developed, well-nourished, elderly African-American female pleasant and cooperative in NAD Head:  Normocephalic and atraumatic. Eyes:  Sclera clear, no icterus.   Conjunctiva pale. Ears:  Normal auditory acuity. Nose:  No deformity, discharge,  or lesions. Mouth:  No deformity or lesions.   Neck:  Supple; no masses or thyromegaly. Lungs:  Clear throughout to auscultation.   No wheezes, crackles, or rhonchi.  Heart:  Regular rate and rhythm; no murmurs, clicks, rubs,  or gallops. Abdomen:  Soft,nontender, BS active,nonpalp mass or hsm.  Midline incisional scars Rectal:  Deferred -documented heme positive Msk:  Symmetrical without gross deformities. . Pulses:  Normal pulses noted. Extremities:  Without clubbing or edema. Neurologic:  Alert and  oriented x4;  grossly normal neurologically. Skin:  Intact without significant lesions or rashes.. Psych:  Alert and cooperative. Normal mood and  affect.  Intake/Output from previous day: 09/05 0701 - 09/06 0700 In: 1036.2 [I.V.:36.2; IV Piggyback:1000] Out: -  Intake/Output this shift: No intake/output data recorded.  Lab Results: Recent Labs    03/28/20 1410 03/29/20 0206  WBC 4.8  --   HGB 9.2* 8.8*  HCT 28.7* 27.7*  PLT 196  --    BMET Recent Labs    03/28/20 1410  NA 138  K 3.5  CL 105  CO2 20*  GLUCOSE 114*  BUN 24*  CREATININE 1.13*  CALCIUM 9.5   LFT Recent Labs    03/29/20 0206  PROT 5.4*  ALBUMIN 3.0*  AST 28  ALT 18  ALKPHOS 43  BILITOT 0.8  BILIDIR 0.2  IBILI 0.6   PT/INR Recent Labs    03/29/20 0206  LABPROT 14.0  INR 1.1     IMPRESSION:  #76 79 year old African-American female with known high-grade non-Hodgkin's lymphoma stage IV, presenting yesterday with progressive weakness, anorexia, nausea and some episodes of vomiting over the past few weeks.  She had been noticing dark stools over the past month and was documented heme positive. Work-up on admission consistent with diffuse metastatic disease with significant adenopathy in the chest and abdomen.  Etiology of melena is not clear, rule out gastric involvement with lymphoma though no abnormality noted on CT.  Rule out gastric ulcer, esophagitis, duodenal ulcer, metastatic disease  #2 normocytic anemia secondary to above #3 GERD #4 history of hypertension #5 status post remote bowel resection 2010 at initial diagnosis of lymphoma, required temporary colostomy  Plan:  Full liquid diet today, n.p.o. after midnight Patient has been scheduled for upper endoscopy with Dr. Bryan Lemma in a.m. tomorrow.  Procedure was discussed in detail with patient including indications risks benefits and she is agreeable to proceed. Continue to trend hemoglobin and transfuse for hemoglobin less than 7.5 Continue twice daily PPI Continue as needed IV Zofran  Oncology will need to be consulted tomorrow Thank you will follow with you   Amy  EsterwoodPA-C  03/29/2020, 8:59 AM     Attending Physician Note   I have taken a history, examined the patient and reviewed the chart. I agree with the Advanced Practitioner's note, impression and recommendations.   Melena, worsening anemia, in patient with  stage IV high grade non-Hodgkin's lymphoma. R/O UGI tract involvement with lymphoma, ulcer, esophagitis, AVM, etc. PPI bid  Trend CBC  EGD tomorrow at 0815 with Dr. Arbie Cookey, MD [facility]

## 2020-03-30 ENCOUNTER — Inpatient Hospital Stay (HOSPITAL_COMMUNITY): Payer: Medicare Other | Admitting: Certified Registered"

## 2020-03-30 ENCOUNTER — Inpatient Hospital Stay (HOSPITAL_COMMUNITY): Payer: Medicare Other

## 2020-03-30 ENCOUNTER — Encounter (HOSPITAL_COMMUNITY)
Admission: EM | Disposition: A | Discharge status: Home / Self Care | Payer: Self-pay | Source: Home / Self Care | Attending: Family Medicine

## 2020-03-30 ENCOUNTER — Other Ambulatory Visit: Payer: Self-pay

## 2020-03-30 ENCOUNTER — Encounter (HOSPITAL_COMMUNITY): Payer: Self-pay | Admitting: Family Medicine

## 2020-03-30 DIAGNOSIS — K2210 Ulcer of esophagus without bleeding: Secondary | ICD-10-CM

## 2020-03-30 DIAGNOSIS — K449 Diaphragmatic hernia without obstruction or gangrene: Secondary | ICD-10-CM

## 2020-03-30 DIAGNOSIS — K259 Gastric ulcer, unspecified as acute or chronic, without hemorrhage or perforation: Secondary | ICD-10-CM

## 2020-03-30 DIAGNOSIS — K2100 Gastro-esophageal reflux disease with esophagitis, without bleeding: Secondary | ICD-10-CM

## 2020-03-30 DIAGNOSIS — K269 Duodenal ulcer, unspecified as acute or chronic, without hemorrhage or perforation: Secondary | ICD-10-CM

## 2020-03-30 DIAGNOSIS — K317 Polyp of stomach and duodenum: Secondary | ICD-10-CM

## 2020-03-30 DIAGNOSIS — K2970 Gastritis, unspecified, without bleeding: Secondary | ICD-10-CM

## 2020-03-30 HISTORY — PX: ESOPHAGOGASTRODUODENOSCOPY (EGD) WITH PROPOFOL: SHX5813

## 2020-03-30 HISTORY — PX: BIOPSY: SHX5522

## 2020-03-30 LAB — BASIC METABOLIC PANEL
Anion gap: 12 (ref 5–15)
BUN: 19 mg/dL (ref 8–23)
CO2: 19 mmol/L — ABNORMAL LOW (ref 22–32)
Calcium: 8.6 mg/dL — ABNORMAL LOW (ref 8.9–10.3)
Chloride: 108 mmol/L (ref 98–111)
Creatinine, Ser: 1.1 mg/dL — ABNORMAL HIGH (ref 0.44–1.00)
GFR calc Af Amer: 55 mL/min — ABNORMAL LOW (ref >60)
GFR calc non Af Amer: 47 mL/min — ABNORMAL LOW (ref >60)
Glucose, Bld: 78 mg/dL (ref 70–99)
Potassium: 3.7 mmol/L (ref 3.5–5.1)
Sodium: 139 mmol/L (ref 135–145)

## 2020-03-30 LAB — CBC
HCT: 21.7 % — ABNORMAL LOW (ref 36.0–46.0)
Hemoglobin: 7 g/dL — ABNORMAL LOW (ref 12.0–15.0)
MCH: 28.3 pg (ref 26.0–34.0)
MCHC: 32.3 g/dL (ref 30.0–36.0)
MCV: 87.9 fL (ref 80.0–100.0)
Platelets: 148 10*3/uL — ABNORMAL LOW (ref 150–400)
RBC: 2.47 MIL/uL — ABNORMAL LOW (ref 3.87–5.11)
RDW: 17.2 % — ABNORMAL HIGH (ref 11.5–15.5)
WBC: 4 10*3/uL (ref 4.0–10.5)
nRBC: 0 % (ref 0.0–0.2)

## 2020-03-30 LAB — PREPARE RBC (CROSSMATCH)

## 2020-03-30 LAB — HEMOGLOBIN AND HEMATOCRIT, BLOOD
HCT: 24.7 % — ABNORMAL LOW (ref 36.0–46.0)
Hemoglobin: 8.2 g/dL — ABNORMAL LOW (ref 12.0–15.0)

## 2020-03-30 SURGERY — ESOPHAGOGASTRODUODENOSCOPY (EGD) WITH PROPOFOL
Anesthesia: Monitor Anesthesia Care

## 2020-03-30 MED ORDER — SUCRALFATE 1 GM/10ML PO SUSP
1.0000 g | Freq: Three times a day (TID) | ORAL | Status: DC
  Administered 2020-03-30 – 2020-03-31 (×4): 1 g via ORAL
  Filled 2020-03-30 (×4): qty 10

## 2020-03-30 MED ORDER — LIDOCAINE HCL (CARDIAC) PF 100 MG/5ML IV SOSY
PREFILLED_SYRINGE | INTRAVENOUS | Status: DC | PRN
  Administered 2020-03-30: 50 mg via INTRATRACHEAL

## 2020-03-30 MED ORDER — PROPOFOL 500 MG/50ML IV EMUL
INTRAVENOUS | Status: AC
  Filled 2020-03-30: qty 50

## 2020-03-30 MED ORDER — PROPOFOL 500 MG/50ML IV EMUL
INTRAVENOUS | Status: DC | PRN
  Administered 2020-03-30 (×2): 20 mg via INTRAVENOUS

## 2020-03-30 MED ORDER — SODIUM CHLORIDE 0.9 % IV SOLN: INTRAVENOUS | Status: DC | PRN

## 2020-03-30 MED ORDER — SODIUM CHLORIDE 0.9 % IV SOLN: INTRAVENOUS | Status: DC

## 2020-03-30 MED ORDER — LACTATED RINGERS IV SOLN: INTRAVENOUS | Status: DC | PRN

## 2020-03-30 MED ORDER — PROPOFOL 500 MG/50ML IV EMUL
INTRAVENOUS | Status: DC | PRN
  Administered 2020-03-30: 135 ug/kg/min via INTRAVENOUS

## 2020-03-30 MED ORDER — ENSURE ENLIVE PO LIQD: 237.0000 mL | Freq: Two times a day (BID) | ORAL | Status: DC

## 2020-03-30 MED ORDER — PROPOFOL 1000 MG/100ML IV EMUL
INTRAVENOUS | Status: AC
  Filled 2020-03-30: qty 100

## 2020-03-30 SURGICAL SUPPLY — 15 items

## 2020-03-30 NOTE — Op Note (Signed)
[facility] Patient Name: Taylor Huynh Procedure Date: 03/30/2020 MRN: [id number] Attending MD: Gerrit Heck , MD Date of Birth: 03/20/1941 CSN: [id number] Age: 79 Admit Type: Inpatient Procedure:                Upper GI endoscopy Indications:              Acute post hemorrhagic anemia, Melena, Abnormal CT                            of the GI tract, Nausea with vomiting                           79 yo female with history of NHL s/p chemotherapy                            and radiation in 2020, presents with weakness,                            nausea/vomiting, melena, and weight loss, with                            symptomatic anemia. Admission Hgb 9.2 from baseline                            ~11.2, and has downtrended to 7.0 today, requiring                            pRBC transfusion this AM. Admission CT C/A/P with                            innumerable bilateral pulmonary nodules and bulky                            retroperitoneal/periaortic and pelvic sidewall                            adenopathy. Providers:                Gerrit Heck, MD, Clyde Lundborg, RN, Tyna Jaksch Technician, Faustina Mbumina, Technician Referring MD:              Medicines:                Monitored Anesthesia Care Complications:            No immediate complications. Estimated Blood Loss:     Estimated blood loss was minimal. Procedure:                Pre-Anesthesia Assessment:                           - Prior to the procedure, a History and Physical                            was performed,  and patient medications and                            allergies were reviewed. The patient's tolerance of                            previous anesthesia was also reviewed. The risks                            and benefits of the procedure and the sedation                            options and risks were discussed with the patient.                             All questions were answered, and informed consent                            was obtained. Prior Anticoagulants: The patient has                            taken no previous anticoagulant or antiplatelet                            agents. ASA Grade Assessment: III - A patient with                            severe systemic disease. After reviewing the risks                            and benefits, the patient was deemed in                            satisfactory condition to undergo the procedure.                           After obtaining informed consent, the endoscope was                            passed under direct vision. Throughout the                            procedure, the patient's blood pressure, pulse, and                            oxygen saturations were monitored continuously. The                            GIF-H190 ([id number] Olympus gastroscope was                            introduced through the mouth, and advanced to the  second part of duodenum. The upper GI endoscopy was                            accomplished without difficulty. The patient                            tolerated the procedure well. Scope In: Scope Out: Findings:      A 4 cm hiatal hernia was present.      One superficial esophageal ulcer with no stigmata of recent bleeding was       found 38 cm from the incisors. The lesion was 5 mm in largest dimension.       Biopsies were taken with a cold forceps for histology. Estimated blood       loss was minimal.      LA Grade A (one or more mucosal breaks less than 5 mm, not extending       between tops of 2 mucosal folds) esophagitis with no bleeding was found       in the lower third of the esophagus.      Multiple areas of edematous, ulcerated mucosa were found in the gastric       fundus and in the gastric body. The largest lesion was a deep, cratered,       15 mm ulcer with surrounding edematous mucosa in the mid gasric body.        Several mucosal biopsies were taken with a cold forceps for histology.       Estimated blood loss was minimal.      Multiple small sessile polyps were found in the gastric fundus and in       the gastric body.      Three non-bleeding superficial duodenal ulcers with no stigmata of       bleeding were found in the second portion of the duodenum. The largest       lesion was 5 mm in largest dimension. Biopsies were taken with a cold       forceps for histology. Estimated blood loss was minimal. Impression:               - 4 cm hiatal hernia.                           - Esophageal ulcer with no stigmata of recent                            bleeding. Biopsied.                           - LA Grade A reflux esophagitis with no bleeding.                           - Gastric ulcers with no stigmata of bleeding.                            Biopsied.                           - Multiple gastric polyps.                           -  Non-bleeding duodenal ulcers with no stigmata of                            bleeding. Biopsied. Moderate Sedation:      Not Applicable - Patient had care per Anesthesia. Recommendation:           - Return patient to hospital ward for ongoing care.                           - Full liquid diet today.                           - Continue present medications.                           - Use Protonix (pantoprazole) 40 mg PO BID.                           - Use sucralfate suspension 1 gram PO QID.                           - Await pathology results.                           - Post transfusion CBC check, then serial Hgb/Hct                            with additional blood products as needed per                            protocol.                           - GI service will continue to follow. Procedure Code(s):        --- Professional ---                           [phone], Esophagogastroduodenoscopy, flexible,                            transoral; with biopsy, single or  multiple Diagnosis Code(s):        --- Professional ---                           K44.9, Diaphragmatic hernia without obstruction or                            gangrene                           K22.10, Ulcer of esophagus without bleeding                           K21.00, Gastro-esophageal reflux disease with                            esophagitis, without bleeding  K25.9, Gastric ulcer, unspecified as acute or                            chronic, without hemorrhage or perforation                           K31.7, Polyp of stomach and duodenum                           K26.9, Duodenal ulcer, unspecified as acute or                            chronic, without hemorrhage or perforation                           D62, Acute posthemorrhagic anemia                           K92.1, Melena (includes Hematochezia)                           R11.2, Nausea with vomiting, unspecified                           R93.3, Abnormal findings on diagnostic imaging of                            other parts of digestive tract CPT copyright 2019 [employer]. All rights reserved. The codes documented in this report are preliminary and upon coder review may  be revised to meet current compliance requirements. Gerrit Heck, MD 03/30/2020 11:24:49 AM Number of Addenda: 0

## 2020-03-30 NOTE — Transfer of Care (Signed)
Immediate Anesthesia Transfer of Care Note  Patient: Taylor Huynh  Procedure(s) Performed: ESOPHAGOGASTRODUODENOSCOPY (EGD) WITH PROPOFOL (N/A ) BIOPSY  Patient Location: Endoscopy Unit  Anesthesia Type:MAC  Level of Consciousness: awake, oriented, patient cooperative and responds to stimulation  Airway & Oxygen Therapy: Patient Spontanous Breathing and Patient connected to face mask oxygen  Post-op Assessment: Report given to RN and Post -op Vital signs reviewed and stable  Post vital signs: Reviewed and stable  Last Vitals:  Vitals Value Taken Time  BP 137/57 03/30/20 1110  Temp    Pulse 83 03/30/20 1111  Resp 26 03/30/20 1111  SpO2 100 % 03/30/20 1111  Vitals shown include unvalidated device data.  Last Pain:  Vitals:   03/30/20 1108  TempSrc:   PainSc: 0-No pain         Complications: No complications documented.

## 2020-03-30 NOTE — Interval H&P Note (Signed)
History and Physical Interval Note:  03/30/2020 9:23 AM  [name]  has presented today for surgery, with the diagnosis of Melena, anemia, metastatic non-Hodgkin's lymphoma.  The various methods of treatment have been discussed with the patient and family. After consideration of risks, benefits and other options for treatment, the patient has consented to  Procedure(s): ESOPHAGOGASTRODUODENOSCOPY (EGD) WITH PROPOFOL (N/A) as a surgical intervention.  The patient's history has been reviewed, patient examined, no change in status, stable for surgery.  I have reviewed the patient's chart and labs.  Questions were answered to the patient's satisfaction.     Taylor Huynh Vihaan Gloss

## 2020-03-30 NOTE — Anesthesia Preprocedure Evaluation (Addendum)
Anesthesia Evaluation  Patient identified by MRN, date of birth, ID band Patient awake    Reviewed: Allergy & Precautions, NPO status , Patient's Chart, lab work & pertinent test results  Airway Mallampati: I  TM Distance: >3 FB Neck ROM: Full    Dental  (+) Edentulous Upper, Edentulous Lower   Pulmonary neg pulmonary ROS,    breath sounds clear to auscultation       Cardiovascular hypertension, Pt. on medications  Rhythm:Regular Rate:Normal     Neuro/Psych Seizures -,     GI/Hepatic Neg liver ROS, hiatal hernia, GERD  Medicated,  Endo/Other  negative endocrine ROS  Renal/GU Renal disease     Musculoskeletal negative musculoskeletal ROS (+)   Abdominal Normal abdominal exam  (+)   Peds  Hematology negative hematology ROS (+)   Anesthesia Other Findings   Reproductive/Obstetrics                            Anesthesia Physical Anesthesia Plan  ASA: III  Anesthesia Plan: MAC   Post-op Pain Management:    Induction: Intravenous  PONV Risk Score and Plan: Propofol infusion  Airway Management Planned: Natural Airway and Simple Face Mask  Additional Equipment: None  Intra-op Plan:   Post-operative Plan:   Informed Consent: I have reviewed the patients History and Physical, chart, labs and discussed the procedure including the risks, benefits and alternatives for the proposed anesthesia with the patient or authorized representative who has indicated his/her understanding and acceptance.   Patient has DNR.  Discussed DNR with patient and Continue DNR.     Plan Discussed with: CRNA  Anesthesia Plan Comments: (Lab Results      Component                Value               Date                      WBC                      4.0                 03/30/2020                HGB                      7.0 (L)             03/30/2020                HCT                      21.7 (L)             03/30/2020                MCV                      87.9                03/30/2020                PLT                      148 (L)             03/30/2020           )  Anesthesia Quick Evaluation  

## 2020-03-30 NOTE — Progress Notes (Signed)
Initial Nutrition Assessment  INTERVENTION:   Once diet advanced: -Ensure Enlive po BID, each supplement provides 350 kcal and 20 grams of protein  NUTRITION DIAGNOSIS:   Increased nutrient needs related to cancer and cancer related treatments as evidenced by estimated needs.  GOAL:   Patient will meet greater than or equal to 90% of their needs  MONITOR:   Diet advancement, Labs, Weight trends, I & O's  REASON FOR ASSESSMENT:   Malnutrition Screening Tool    ASSESSMENT:   79 y.o. female, with history of high-grade non-Hodgkin's lymphoma stage IV, initially diagnosed in 2010, and patient had undergone bowel resection at that time.  She has been followed by oncology and had chemotherapy and radiation in 2020.Also with history of hypertension and GERD.She presented to the emergency room yesterday with complaints of progressive weakness anorexia and some nausea and occasional vomiting at home over the past couple of weeks.  Patient currently in endoscopy for EGD. Currently NPO for procedure. Per chart review, pt has had N/V, poor appetite, dysphagia for 4 weeks PTA.   Once diet advanced, will order nutritional supplements.   Pt reports 13 lbs of weight loss over the past month. Per weight records, pt had no weight loss. Will continue to monitor weight trends.   Labs reviewed. Medications reviewed.  NUTRITION - FOCUSED PHYSICAL EXAM:  Unable to complete  Diet Order:   Diet Order            Diet NPO time specified  Diet effective midnight                 EDUCATION NEEDS:   No education needs have been identified at this time  Skin:  Skin Assessment: Reviewed RN Assessment  Last BM:  9/6  Height:   Ht Readings from Last 1 Encounters:  03/29/20 5\' 4"  (1.626 m)    Weight:   Wt Readings from Last 1 Encounters:  03/29/20 78.9 kg    BMI:  Body mass index is 29.87 kg/m.  Estimated Nutritional Needs:   Kcal:  1800-2000  Protein:  80-95g  Fluid:   2L/day  Clayton Bibles, MS, RD, LDN Inpatient Clinical Dietitian Contact information available via Amion

## 2020-03-30 NOTE — Progress Notes (Signed)
Lower extremity venous duplex order received. Attempted x2, first patient was in endo, then eating lunch. Will attempt again tomorrow as schedule permits.  03/30/2020 3:21 PM Kelby Aline., MHA, RVT, RDCS, RDMS e

## 2020-03-30 NOTE — Progress Notes (Signed)
PROGRESS NOTE    [name]  OFB:[id number] DOB: 1941/04/14 DOA: 03/28/2020 PCP: [name], MD   Brief Narrative:  Taylor Huynh is a 79 y.o. 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 she received chemotherapy and radiation for her lymphoma last year.  She then saw her oncologist who told her that she needed a biopsy done to decide what the next course of treatment would be.  States she went for the biopsy but the lady there were told her since she had something to drink, the procedure had to be rescheduled.  States she has not gone back for the biopsy or seen her oncologist since then.  Reports having generalized weakness/fatigue, anorexia, early satiety, weight loss, and dark stools for the past 4 weeks.  2 weeks ago she had some soup and vomited soon after.  Since then she has been vomiting whenever she tries to eat.  She has only been able to tolerate eating very small quantities of food such as a piece of chocolate.  There is no blood in her vomit.  Denies chest pain or abdominal pain.  She is also having some shortness of breath.  She has been vaccinated against Covid.  ED Course: Vital signs stable.  WBC 4.8, hemoglobin 9.2, hematocrit 28.7, platelet 196k.  Hemoglobin 8.8 on repeat labs.  Melenic stool noted on rectal exam in the ED.  Sodium 138, potassium 3.5, chloride 105, bicarb 20, BUN 24, creatinine 1.1, glucose 114.  Baseline creatinine 0.7.  UA pending.  FOBT positive.  High-sensitivity troponin negative.  SARS-CoV-2 PCR test pending.  TSH normal.  INR 1.1.  LFTs normal.  Lipase normal. Chest x-ray showing multiple bilateral lung masses, greater on the left, most compatible with metastatic disease. CT angiogram chest showing innumerable bilateral pulmonary nodules and masses most compatible with metastatic disease.  Borderline size right paratracheal lymph  nodes and left hilar lymph nodes. CT abdomen pelvis showing bulky retroperitoneal/periaortic and pelvic sidewall adenopathy.  Right inguinal adenopathy. 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  FOBT positive.  Melenic stool noted on rectal exam done in the ED.  Hemodynamically stable.  GI consulted.  Patient's hemoglobin dropped to 7 this morning.  1 unit of PRBC transfusion was ordered by GI.  She underwent EGD after that and found to have several abnormalities including gastric ulcer, duodenal ulcer, esophagitis.  GI recommends continuing twice daily IV Protonix and will add Carafate.  We will start her on full liquid diet and advance diet as tolerated.  We will keep in the hospital overnight to monitor H&H later and tomorrow morning.  She was also seen by oncologist Dr. Lorenso Courier who did not recommend keeping patient in the hospital for any sort of further work-up.  He is going to arrange outpatient follow-up for patient.  If patient does well, she can be discharged home tomorrow.  AKI: Likely prerenal due to dehydration in  the setting of poor oral intake/vomiting.  BUN 24, creatinine 1.1.  Baseline creatinine 0.7..  Renal function has not improved much but is not terribly elevated either.  Hopefully she will be able to eat and hydrate herself.  I will also start her on gentle hydration with normal saline at 75 cc/h.  Check labs in the  morning.  Hypertension -Currently normotensive.  Hold antihypertensives at this time due to concern for GI bleed.  GERD -IV PPI as mentioned above  DVT prophylaxis: SCDs Start: 03/29/20 0557   Code Status: DNR  Family Communication: None present at bedside.  Plan of care discussed with patient in length and he verbalized understanding and agreed with it.  Status is: Inpatient  Remains inpatient appropriate because:Inpatient level of care appropriate due to severity of illness   Dispo: The patient is from: Home              Anticipated d/c is to: Home              Anticipated d/c date is: 1 day              Patient currently is not medically stable to d/c.        Estimated body mass index is 29.87 kg/m as calculated from the following:   Height as of this encounter: 5\' 4"  (1.626 m).   Weight as of this encounter: 78.9 kg.      Nutritional status:  Nutrition Problem: Increased nutrient needs Etiology: cancer and cancer related treatments   Signs/Symptoms: estimated needs   Interventions: Refer to RD note for recommendations    Consultants:   GI   Procedures:   EGD  Antimicrobials:  Anti-infectives (From admission, onward)   None         Subjective: Patient seen and examined.  She has no new complaint.  Objective: Vitals:   03/30/20 1110 03/30/20 1120 03/30/20 1127 03/30/20 1130  BP: (!) 137/57 (!) 117/57  (!) 121/48  Pulse: 88 84 84 83  Resp: (!) 40 (!) 30 (!) 28 16  Temp:      TempSrc:      SpO2: 100% 95% 95% 91%  Weight:      Height:        Intake/Output Summary (Last 24 hours) at 03/30/2020 1154 Last data filed at 03/30/2020 1112 Gross per 24 hour  Intake 1660.97 ml  Output 450 ml  Net 1210.97 ml   Filed Weights   03/29/20 0712  Weight: 78.9 kg    Examination:  General exam: Appears calm and comfortable  Respiratory system: Clear to auscultation. Respiratory effort normal. Cardiovascular system: S1 & S2 heard, RRR. No  JVD, murmurs, rubs, gallops or clicks. No pedal edema. Gastrointestinal system: Abdomen is nondistended, soft and nontender. No organomegaly or masses felt. Normal bowel sounds heard. Central nervous system: Alert and oriented. No focal neurological deficits. Extremities: Symmetric 5 x 5 power. Skin: No rashes, lesions or ulcers Psychiatry: Judgement and insight appear normal. Mood & affect appropriate.    Data Reviewed: I have personally reviewed following labs and imaging studies  CBC: Recent Labs  Lab 03/28/20 1410 03/29/20 0206 03/30/20 0543  WBC 4.8  --  4.0  HGB 9.2* 8.8* 7.0*  HCT 28.7* 27.7* 21.7*  MCV 89.1  --  87.9  PLT 196  --  161*   Basic Metabolic Panel: Recent Labs  Lab 03/28/20 1410 03/30/20 0543  NA 138 139  K 3.5 3.7  CL 105 108  CO2 20* 19*  GLUCOSE 114* 78  BUN 24* 19  CREATININE 1.13* 1.10*  CALCIUM 9.5 8.6*   GFR: Estimated Creatinine Clearance: 40.8 mL/min (A) (by C-G formula based on SCr of 1.1 mg/dL (H)). Liver Function Tests: Recent Labs  Lab 03/29/20 0206  AST 28  ALT 18  ALKPHOS 43  BILITOT 0.8  PROT 5.4*  ALBUMIN 3.0*   Recent Labs  Lab 03/29/20 0206  LIPASE 13   No results for input(s): AMMONIA in the last 168 hours. Coagulation Profile: Recent Labs  Lab 03/29/20 0206  INR 1.1   Cardiac Enzymes: No results for input(s): CKTOTAL, CKMB, CKMBINDEX, TROPONINI in the last 168 hours. BNP (last 3 results) No results for input(s): PROBNP in the last 8760 hours. HbA1C: No results for input(s): HGBA1C in the last 72 hours. CBG: No results for input(s): GLUCAP in the last 168 hours. Lipid Profile: No results for input(s): CHOL, HDL, LDLCALC, TRIG, CHOLHDL, LDLDIRECT in the last 72 hours. Thyroid Function Tests: Recent Labs    03/29/20 0206  TSH 3.944   Anemia Panel: Recent Labs    03/29/20 0206  FERRITIN 635*  TIBC 256  IRON 37  RETICCTPCT 3.0   Sepsis Labs: No results for input(s): PROCALCITON, LATICACIDVEN in  the last 168 hours.  Recent Results (from the past 240 hour(s))  SARS Coronavirus 2 by RT PCR (hospital order, performed in [facility] hospital lab) Nasopharyngeal Nasopharyngeal Swab     Status: None   Collection Time: 03/29/20  1:31 AM   Specimen: Nasopharyngeal Swab  Result Value Ref Range Status   SARS Coronavirus 2 NEGATIVE NEGATIVE Final    Comment: (NOTE) SARS-CoV-2 target nucleic acids are NOT DETECTED.  The SARS-CoV-2 RNA is generally detectable in upper and lower respiratory specimens during the acute phase of infection. The lowest concentration of SARS-CoV-2 viral copies this assay can detect is 250 copies / mL. A negative result does not preclude SARS-CoV-2 infection and should not be used as the sole basis for treatment or other patient management decisions.  A negative result may occur with improper specimen collection / handling, submission of specimen other than nasopharyngeal swab, presence of viral mutation(s) within the areas targeted by this assay, and inadequate number of viral copies (<250 copies / mL). A negative result must be combined with clinical observations, patient history, and epidemiological information.  Fact Sheet for Patients:   StrictlyIdeas.no  Fact Sheet for Healthcare Providers: BankingDealers.co.za  This test is not yet approved or  cleared by the [place] FDA and has been authorized for detection and/or diagnosis of SARS-CoV-2 by FDA under an Emergency Use Authorization (EUA).  This EUA will remain in effect (meaning this test can be used) for the duration of the COVID-19 declaration under Section 564(b)(1) of the Act, 21 U.S.C. section 360bbb-3(b)(1), unless the authorization is terminated or revoked sooner.  Performed at [facility], [place] [address], [place], [place] [address]       Radiology Studies: DG Chest 2 View  Result Date: 03/28/2020 CLINICAL DATA:   Shortness of breath, weakness and lethargy. Non-Hodgkin's lymphoma. EXAM: CHEST - 2 VIEW COMPARISON:  PET-CT dated 05/14/2019. Chest radiographs dated 06/11/2016. FINDINGS: Interval multiple rounded and oval masses in both lungs, greater on the left. Some of these are pleural based and others appear to be within the lung parenchyma. Otherwise, the lungs are clear. Normal sized heart. Thoracic spine degenerative changes. IMPRESSION: Interval multiple bilateral lung masses, greater on the left, most compatible  with metastatic disease and most likely related to the patient's non-Hodgkin's lymphoma. Electronically Signed   By: Claudie Revering M.D.   On: 03/28/2020 14:54   CT Angio Chest PE W and/or Wo Contrast  Result Date: 03/29/2020 CLINICAL DATA:  B-cell lymphoma. Anorexia, vomiting. Bowel obstruction suspected. EXAM: CT ANGIOGRAPHY CHEST WITH CONTRAST TECHNIQUE: Multidetector CT imaging of the chest was performed using the standard protocol during bolus administration of intravenous contrast. Multiplanar CT image reconstructions and MIPs were obtained to evaluate the vascular anatomy. CONTRAST:  154mL OMNIPAQUE IOHEXOL 350 MG/ML SOLN COMPARISON:  Chest CT 02/10/2010.  PET CT 08/22/2018 FINDINGS: Cardiovascular: Heart is normal size. Aorta normal caliber with scattered calcifications. Mediastinum/Nodes: Borderline right paratracheal lymph node with a short axis diameter of 9 mm. No axillary adenopathy. Borderline left hilar lymph nodes with a short axis diameter of 11 mm. Lungs/Pleura: Extensive bilateral pulmonary nodules and masses, most peripherally. Index left lower lobe mass measures 4 cm on image 78. Index right lower lobe mass measures 5 cm on image 87. no effusions. Upper Abdomen: Imaging into the upper abdomen demonstrates no acute findings. Musculoskeletal: Chest wall soft tissues are unremarkable. No acute bony abnormality. Review of the MIP images confirms the above findings. IMPRESSION: Innumerable  bilateral pulmonary nodules and masses most compatible with metastatic disease. Borderline sized right paratracheal lymph nodes and left hilar lymph nodes. Aortic Atherosclerosis (ICD10-I70.0). Electronically Signed   By: Rolm Baptise M.D.   On: 03/29/2020 03:04   CT ABDOMEN PELVIS W CONTRAST  Result Date: 03/29/2020 CLINICAL DATA:  B-cell lymphoma. Bowel obstruction suspected. Anorexia, vomiting EXAM: CT ABDOMEN AND PELVIS WITH CONTRAST TECHNIQUE: Multidetector CT imaging of the abdomen and pelvis was performed using the standard protocol following bolus administration of intravenous contrast. CONTRAST:  161mL OMNIPAQUE IOHEXOL 350 MG/ML SOLN COMPARISON:  07/18/2018 FINDINGS: Lower chest: Innumerable bilateral pulmonary nodules and masses seen as seen on chest CT today. Heart is normal size. Hepatobiliary: No focal liver abnormality is seen. Status post cholecystectomy. No biliary dilatation. Pancreas: No focal abnormality or ductal dilatation. Spleen: No focal abnormality.  Normal size. Adrenals/Urinary Tract: Bilateral renal cysts. No adrenal mass or hydronephrosis. Urinary bladder decompressed, grossly unremarkable. Stomach/Bowel: Colonic diverticulosis. No active diverticulitis. No evidence of bowel obstruction. Stomach and small bowel decompressed, grossly unremarkable. Vascular/Lymphatic: Aortic atherosclerosis. No aneurysm. Bulky retroperitoneal adenopathy. Conglomerate nodal mass in the left periaortic region measures up to 6.4 cm. Numerous other retroperitoneal periaortic lymph nodes as well as pelvic sidewall/iliac chain lymph nodes bilaterally right pelvic sidewall lymph node on image 69 as a diameter of 4.7 cm. Right inguinal adenopathy. Short axis diameter measures 2.1 cm. Reproductive: Prior hysterectomy.  No adnexal masses. Other: No free fluid or free air. Musculoskeletal: No acute bony abnormality. IMPRESSION: Innumerable bilateral pulmonary nodules and masses in the visualized lung bases. See  chest CT report. Bulky retroperitoneal/periaortic and pelvic sidewall adenopathy. Right inguinal adenopathy. Colonic diverticulosis. Aortic atherosclerosis. Electronically Signed   By: Rolm Baptise M.D.   On: 03/29/2020 03:07    Scheduled Meds: . [MAR Hold] fluticasone  1 spray Each Nare Daily  . [MAR Hold] pantoprazole (PROTONIX) IV  40 mg Intravenous Q12H  . [MAR Hold] prednisoLONE acetate  1 drop Both Eyes Daily   Continuous Infusions:   LOS: 1 day   Time spent: 29 minutes   Darliss Cheney, MD [facility]  03/30/2020, 11:54 AM   To contact the attending provider between 7A-7P or the covering provider during after hours 7P-7A, please log into the  web site [email or web address].

## 2020-03-30 NOTE — Anesthesia Postprocedure Evaluation (Signed)
Anesthesia Post Note  Patient: Taylor Huynh  Procedure(s) Performed: ESOPHAGOGASTRODUODENOSCOPY (EGD) WITH PROPOFOL (N/A ) BIOPSY     Patient location during evaluation: PACU Anesthesia Type: MAC Level of consciousness: awake and alert Pain management: pain level controlled Vital Signs Assessment: post-procedure vital signs reviewed and stable Respiratory status: spontaneous breathing, nonlabored ventilation, respiratory function stable and patient connected to nasal cannula oxygen Cardiovascular status: stable and blood pressure returned to baseline Postop Assessment: no apparent nausea or vomiting Anesthetic complications: no   No complications documented.  Last Vitals:  Vitals:   03/30/20 1127 03/30/20 1130  BP:  (!) 121/48  Pulse: 84 83  Resp: (!) 28 16  Temp:    SpO2: 95% 91%    Last Pain:  Vitals:   03/30/20 1130  TempSrc:   PainSc: 0-No pain                 Effie Berkshire

## 2020-03-31 ENCOUNTER — Other Ambulatory Visit: Payer: Self-pay

## 2020-03-31 ENCOUNTER — Inpatient Hospital Stay (HOSPITAL_BASED_OUTPATIENT_CLINIC_OR_DEPARTMENT_OTHER): Payer: Medicare Other

## 2020-03-31 DIAGNOSIS — R195 Other fecal abnormalities: Secondary | ICD-10-CM

## 2020-03-31 DIAGNOSIS — K2970 Gastritis, unspecified, without bleeding: Secondary | ICD-10-CM

## 2020-03-31 DIAGNOSIS — K2990 Gastroduodenitis, unspecified, without bleeding: Secondary | ICD-10-CM

## 2020-03-31 DIAGNOSIS — M7989 Other specified soft tissue disorders: Secondary | ICD-10-CM

## 2020-03-31 DIAGNOSIS — K259 Gastric ulcer, unspecified as acute or chronic, without hemorrhage or perforation: Secondary | ICD-10-CM

## 2020-03-31 DIAGNOSIS — R6881 Early satiety: Secondary | ICD-10-CM

## 2020-03-31 DIAGNOSIS — R1013 Epigastric pain: Secondary | ICD-10-CM

## 2020-03-31 LAB — TYPE AND SCREEN
ABO/RH(D): A POS
Antibody Screen: NEGATIVE
Unit division: 0

## 2020-03-31 LAB — CBC WITH DIFFERENTIAL/PLATELET
Abs Immature Granulocytes: 0.04 10*3/uL (ref 0.00–0.07)
Basophils Absolute: 0 10*3/uL (ref 0.0–0.1)
Basophils Relative: 1 %
Eosinophils Absolute: 0.2 10*3/uL (ref 0.0–0.5)
Eosinophils Relative: 6 %
HCT: 24.8 % — ABNORMAL LOW (ref 36.0–46.0)
Hemoglobin: 8 g/dL — ABNORMAL LOW (ref 12.0–15.0)
Immature Granulocytes: 1 %
Lymphocytes Relative: 13 %
Lymphs Abs: 0.5 10*3/uL — ABNORMAL LOW (ref 0.7–4.0)
MCH: 28.2 pg (ref 26.0–34.0)
MCHC: 32.3 g/dL (ref 30.0–36.0)
MCV: 87.3 fL (ref 80.0–100.0)
Monocytes Absolute: 0.8 10*3/uL (ref 0.1–1.0)
Monocytes Relative: 21 %
Neutro Abs: 2.3 10*3/uL (ref 1.7–7.7)
Neutrophils Relative %: 58 %
Platelets: 147 10*3/uL — ABNORMAL LOW (ref 150–400)
RBC: 2.84 MIL/uL — ABNORMAL LOW (ref 3.87–5.11)
RDW: 17.6 % — ABNORMAL HIGH (ref 11.5–15.5)
WBC: 4 10*3/uL (ref 4.0–10.5)
nRBC: 0 % (ref 0.0–0.2)

## 2020-03-31 LAB — BASIC METABOLIC PANEL
Anion gap: 11 (ref 5–15)
BUN: 15 mg/dL (ref 8–23)
CO2: 18 mmol/L — ABNORMAL LOW (ref 22–32)
Calcium: 8.5 mg/dL — ABNORMAL LOW (ref 8.9–10.3)
Chloride: 110 mmol/L (ref 98–111)
Creatinine, Ser: 0.88 mg/dL (ref 0.44–1.00)
GFR calc Af Amer: >60 mL/min (ref >60)
GFR calc non Af Amer: >60 mL/min (ref >60)
Glucose, Bld: 89 mg/dL (ref 70–99)
Potassium: 3.6 mmol/L (ref 3.5–5.1)
Sodium: 139 mmol/L (ref 135–145)

## 2020-03-31 LAB — BPAM RBC
Blood Product Expiration Date: 202109142359
ISSUE DATE / TIME: 202109071004
Unit Type and Rh: 6200

## 2020-03-31 MED ORDER — SUCRALFATE 1 G PO TABS: 1.0000 g | ORAL_TABLET | Freq: Three times a day (TID) | ORAL | 0 refills | Status: DC

## 2020-03-31 MED ORDER — FERROUS SULFATE 325 (65 FE) MG PO TABS: 325.0000 mg | ORAL_TABLET | Freq: Every day | ORAL | 3 refills | Status: AC

## 2020-03-31 MED ORDER — PANTOPRAZOLE SODIUM 40 MG PO TBEC: 40.0000 mg | DELAYED_RELEASE_TABLET | Freq: Two times a day (BID) | ORAL | 0 refills | Status: AC

## 2020-03-31 MED ORDER — PANTOPRAZOLE SODIUM 40 MG PO TBEC
40.0000 mg | DELAYED_RELEASE_TABLET | Freq: Two times a day (BID) | ORAL | Status: DC
  Administered 2020-03-31: 40 mg via ORAL
  Filled 2020-03-31: qty 1

## 2020-03-31 NOTE — Hospital Course (Signed)
Summary:  RIGHT:  - There is no evidence of deep vein thrombosis in the lower extremity.  However, portions of this examination were limited- see technologist  comments above.     - No cystic structure found in the popliteal fossa.     LEFT:  - No evidence of common femoral vein obstruction.     *See table(s) above for measurements and observations.

## 2020-03-31 NOTE — Progress Notes (Signed)
     [facility] Progress Note  CC:  Anemia, melena  Subjective:  Feels good.  Would like to go home.  IV was taken out because hand is swollen.  Stools still a little bit dark but not like they were.  Objective:  Vital signs in last 24 hours: Temp:  [97.6 F (36.4 C)-99.7 F (37.6 C)] 98.6 F (37 C) (09/08 0534) Pulse Rate:  [81-90] 81 (09/08 0534) Resp:  [16-40] 16 (09/08 0534) BP: (104-137)/(36-92) 120/58 (09/08 0534) SpO2:  [89 %-100 %] 89 % (09/08 0534) Last BM Date: 03/29/20 General:  Alert, Well-developed, in NAD Heart:  Regular rate and rhythm; no murmurs Pulm:  CTAB.  No W/R/R. Abdomen:  Soft, non-distended.  BS present.  Non-tender. Extremities:  Without edema. Neurologic:  Alert and oriented x 4;  grossly normal neurologically. Psych:  Alert and cooperative. Normal mood and affect.  Intake/Output from previous day: 09/07 0701 - 09/08 0700 In: 1363.2 [I.V.:1363.2] Out: 0   Lab Results: Recent Labs    03/28/20 1410 03/29/20 0206 03/30/20 0543 03/30/20 2011 03/31/20 0552  WBC 4.8  --  4.0  --  4.0  HGB 9.2*   < > 7.0* 8.2* 8.0*  HCT 28.7*   < > 21.7* 24.7* 24.8*  PLT 196  --  148*  --  147*   < > = values in this interval not displayed.   BMET Recent Labs    03/28/20 1410 03/30/20 0543 03/31/20 0552  NA 138 139 139  K 3.5 3.7 3.6  CL 105 108 110  CO2 20* 19* 18*  GLUCOSE 114* 78 89  BUN 24* 19 15  CREATININE 1.13* 1.10* 0.88  CALCIUM 9.5 8.6* 8.5*   LFT Recent Labs    03/29/20 0206  PROT 5.4*  ALBUMIN 3.0*  AST 28  ALT 18  ALKPHOS 43  BILITOT 0.8  BILIDIR 0.2  IBILI 0.6   PT/INR Recent Labs    03/29/20 0206  LABPROT 14.0  INR 1.1   Assessment / Plan: #74 79 year old African-American female with known high-grade non-Hodgkin's lymphoma stage IV, presenting with progressive weakness, anorexia, nausea and some episodes of vomiting over the past few weeks.  She had been noticing dark stools over the past month and was  documented heme positive. Work-up on admission consistent with diffuse metastatic disease with significant adenopathy in the chest and abdomen.  EGD on 9/7 showed the following:  - 4 cm hiatal hernia. - Esophageal ulcer with no stigmata of recent bleeding. Biopsied. - LA Grade A reflux esophagitis with no bleeding. - Gastric ulcers with no stigmata of bleeding. Biopsied. - Multiple gastric polyps. - Non-bleeding duodenal ulcers with no stigmata of bleeding. Biopsied.  Continued darkish stools likely old blood as Hgb is stable and there was no sign of active bleeding on EGD.  #2 normocytic anemia secondary to above:  Hgb stable. #3 GERD #4 history of hypertension #5 status post remote bowel resection 2010 at initial diagnosis of lymphoma, required temporary colostomy  -Ok for discharge today.  Should go home with pantoprazole 40 mg BID and carafate for 2 weeks.  Onc can repeat labs to follow-up on Hgb. -Will follow-up biopsy results as outpatient.   LOS: 2 days   Laban Emperor. Shamona Wirtz  03/31/2020, 9:31 AM

## 2020-03-31 NOTE — Discharge Summary (Signed)
Physician Discharge Summary  Taylor Huynh LGX:[id number] DOB: April 11, 1941 DOA: 03/28/2020  PCP: Taylor Cha, MD  Admit date: 03/28/2020 Discharge date: 03/31/2020  Time spent: 35 minutes  Recommendations for Outpatient Follow-up:  1. Needs possible repeat endoscopy and follow-up of biopsy results for H. pylori in the outpatient setting with Dr. Luz Huynh refer as an outpatient to them if there is a question 2. Given Rx this admission for Carafate, PPI 3. Start iron on 9/15 4. Needs CBC and Chem-12 in the outpatient setting in 1 week at PCP office 5. Outpatient oncology follow-up 6. Please set her up for outpatient iron infusions but see above for oral recommendations  Discharge Diagnoses:  Principal Problem:   Upper GI bleed Active Problems:   GERD   Lymphoma ([place])   Edema of lower extremity   AKI (acute kidney injury) ([place])   Upper GI bleeding   Hiatal hernia   Gastritis and gastroduodenitis   Multiple gastric ulcers   Multiple duodenal ulcers   Discharge Condition: Fair  Diet recommendation: Heart healthy  Filed Weights   03/29/20 0712  Weight: 78.9 kg    History of present illness:  79 year old black female home dwelling prior high-grade non-Hodgkin's lymphoma stage IV previously on EPOCH-R 2010 2011-(does not wish any further chemotherapy), HTN, reflux admitted 03/29/2020 with 4 weeks of satiety Also started to have dark stool 4 weeks prior to coming into the hospital unable to tolerate much diet Dark stools culminated with her being admitted for suspected upper GI bleed Hemoglobin on admission 8.8 down from baseline 10-11 Patient was kept n.p.o. placed on IV Protonix GI was consulted and it was noted that she had a hiatal hernia in 2009  Hospital Course:  Upper GI bleed Endoscopy was performed on 9/7 showing 4 cm hiatal hernia esophageal ulcer grade a esophagitis multiple gastric polyps nonbleeding duodenal ulcers GI recommended Protonix twice  daily Carafate 4 times daily-hemoglobin stabilized during hospital stay  Acute blood loss anemia Hemoglobin dropped during hospital stay-patient was transfused 1 unit of packed red blood cells on 9/6 Hemoglobin acceptable at 8.0 on discharge Iron has been deferred until 9/15 as this may obfuscate things Will need outpatient labs with Dr. Lorenso Huynh or GI in the outpatient setting I have CCed both of them Patient's iron studies showed a saturation of 14 with a iron level of 37 so she may benefit from from outpatient infusions We have started her on iron on discharge   AKI Likely prerenal secondary to azotemia versus renal dysfunction  High-grade non-Hodgkin's previously Rx EPOCH-R--will need follow-up with oncology in the outpatient setting hold on additional imaging She is already DNR and would discuss this in the outpatient setting with her PCP She will need outpatient coordination  DVT ruled out in upper extremities She had an ultrasound Doppler that ruled this out She did however have a phlebitis from IV which was managed with elevation  - Procedures:  EGD as above (i.e. Studies not automatically included, echos, thoracentesis, etc; not x-rays)  Consultations:  Gastroenterology  Discharge Exam: Vitals:   03/30/20 2023 03/31/20 0534  BP: (!) 104/36 (!) 120/58  Pulse: 84 81  Resp: 17 16  Temp: 99.7 F (37.6 C) 98.6 F (37 C)  SpO2: 94% (!) 89%    General: Awake alert coherent no distress EOMI NCAT no focal deficit Cardiovascular: S1-S2 no murmur rub or gallop RRR Respiratory: Clinically clear no added sound Abdomen soft no rebound no guarding Left hand slightly swollen  Discharge Instructions  Discharge Instructions    Diet - low sodium heart healthy   Complete by: As directed    Discharge instructions   Complete by: As directed    Increase activity slowly   Complete by: As directed      Allergies as of 03/31/2020      Reactions   Strawberry (diagnostic)  Itching   Tomato Itching      Medication List    STOP taking these medications   furosemide 20 MG tablet Commonly known as: LASIX     TAKE these medications   amLODipine 10 MG tablet Commonly known as: NORVASC Take 10 mg by mouth every other day.   cholecalciferol 1000 units tablet Commonly known as: VITAMIN D Take 1,000 Units by mouth daily.   famotidine 20 MG tablet Commonly known as: PEPCID Take 20 mg by mouth at bedtime as needed.   ferrous sulfate 325 (65 FE) MG tablet Take 1 tablet (325 mg total) by mouth daily with breakfast. Start taking on: April 07, 2020   fluticasone 50 MCG/ACT nasal spray Commonly known as: FLONASE Place 1 spray into both nostrils daily.   lidocaine-prilocaine cream Commonly known as: EMLA Apply 1 application topically as needed.   melatonin 5 MG Tabs Take 5 mg by mouth at bedtime.   pantoprazole 40 MG tablet Commonly known as: PROTONIX Take 1 tablet (40 mg total) by mouth 2 (two) times daily.   prednisoLONE acetate 1 % ophthalmic suspension Commonly known as: PRED FORTE Place 1 drop into both eyes daily.   sucralfate 1 g tablet Commonly known as: CARAFATE Take 1 tablet (1 g total) by mouth 4 (four) times daily -  with meals and at bedtime for 14 days.   vitamin B-12 1000 MCG tablet Commonly known as: CYANOCOBALAMIN Take 1,000 mcg by mouth every other day.      Allergies  Allergen Reactions  . Strawberry (Diagnostic) Itching  . Tomato Itching      The results of significant diagnostics from this hospitalization (including imaging, microbiology, ancillary and laboratory) are listed below for reference.    Significant Diagnostic Studies: DG Chest 2 View  Result Date: 03/28/2020 CLINICAL DATA:  Shortness of breath, weakness and lethargy. Non-Hodgkin's lymphoma. EXAM: CHEST - 2 VIEW COMPARISON:  PET-CT dated 05/14/2019. Chest radiographs dated 06/11/2016. FINDINGS: Interval multiple rounded and oval masses in both  lungs, greater on the left. Some of these are pleural based and others appear to be within the lung parenchyma. Otherwise, the lungs are clear. Normal sized heart. Thoracic spine degenerative changes. IMPRESSION: Interval multiple bilateral lung masses, greater on the left, most compatible with metastatic disease and most likely related to the patient's non-Hodgkin's lymphoma. Electronically Signed   By: Claudie Revering M.D.   On: 03/28/2020 14:54   CT Angio Chest PE W and/or Wo Contrast  Result Date: 03/29/2020 CLINICAL DATA:  B-cell lymphoma. Anorexia, vomiting. Bowel obstruction suspected. EXAM: CT ANGIOGRAPHY CHEST WITH CONTRAST TECHNIQUE: Multidetector CT imaging of the chest was performed using the standard protocol during bolus administration of intravenous contrast. Multiplanar CT image reconstructions and MIPs were obtained to evaluate the vascular anatomy. CONTRAST:  135mL OMNIPAQUE IOHEXOL 350 MG/ML SOLN COMPARISON:  Chest CT 02/10/2010.  PET CT 08/22/2018 FINDINGS: Cardiovascular: Heart is normal size. Aorta normal caliber with scattered calcifications. Mediastinum/Nodes: Borderline right paratracheal lymph node with a short axis diameter of 9 mm. No axillary adenopathy. Borderline left hilar lymph nodes with a short axis diameter of 11 mm. Lungs/Pleura: Extensive bilateral pulmonary  nodules and masses, most peripherally. Index left lower lobe mass measures 4 cm on image 78. Index right lower lobe mass measures 5 cm on image 87. no effusions. Upper Abdomen: Imaging into the upper abdomen demonstrates no acute findings. Musculoskeletal: Chest wall soft tissues are unremarkable. No acute bony abnormality. Review of the MIP images confirms the above findings. IMPRESSION: Innumerable bilateral pulmonary nodules and masses most compatible with metastatic disease. Borderline sized right paratracheal lymph nodes and left hilar lymph nodes. Aortic Atherosclerosis (ICD10-I70.0). Electronically Signed   By: Rolm Baptise M.D.   On: 03/29/2020 03:04   CT ABDOMEN PELVIS W CONTRAST  Result Date: 03/29/2020 CLINICAL DATA:  B-cell lymphoma. Bowel obstruction suspected. Anorexia, vomiting EXAM: CT ABDOMEN AND PELVIS WITH CONTRAST TECHNIQUE: Multidetector CT imaging of the abdomen and pelvis was performed using the standard protocol following bolus administration of intravenous contrast. CONTRAST:  162mL OMNIPAQUE IOHEXOL 350 MG/ML SOLN COMPARISON:  07/18/2018 FINDINGS: Lower chest: Innumerable bilateral pulmonary nodules and masses seen as seen on chest CT today. Heart is normal size. Hepatobiliary: No focal liver abnormality is seen. Status post cholecystectomy. No biliary dilatation. Pancreas: No focal abnormality or ductal dilatation. Spleen: No focal abnormality.  Normal size. Adrenals/Urinary Tract: Bilateral renal cysts. No adrenal mass or hydronephrosis. Urinary bladder decompressed, grossly unremarkable. Stomach/Bowel: Colonic diverticulosis. No active diverticulitis. No evidence of bowel obstruction. Stomach and small bowel decompressed, grossly unremarkable. Vascular/Lymphatic: Aortic atherosclerosis. No aneurysm. Bulky retroperitoneal adenopathy. Conglomerate nodal mass in the left periaortic region measures up to 6.4 cm. Numerous other retroperitoneal periaortic lymph nodes as well as pelvic sidewall/iliac chain lymph nodes bilaterally right pelvic sidewall lymph node on image 69 as a diameter of 4.7 cm. Right inguinal adenopathy. Short axis diameter measures 2.1 cm. Reproductive: Prior hysterectomy.  No adnexal masses. Other: No free fluid or free air. Musculoskeletal: No acute bony abnormality. IMPRESSION: Innumerable bilateral pulmonary nodules and masses in the visualized lung bases. See chest CT report. Bulky retroperitoneal/periaortic and pelvic sidewall adenopathy. Right inguinal adenopathy. Colonic diverticulosis. Aortic atherosclerosis. Electronically Signed   By: Rolm Baptise M.D.   On: 03/29/2020 03:07    VAS [place] LOWER EXTREMITY VENOUS (DVT)  Result Date: 03/31/2020  Lower Venous DVTStudy Indications: Swelling.  Risk Factors: Cancer. Limitations: Poor ultrasound/tissue interface. Comparison Study: No prior studies. Performing Technologist: Oliver Hum RVT  Examination Guidelines: A complete evaluation includes B-mode imaging, spectral Doppler, color Doppler, and power Doppler as needed of all accessible portions of each vessel. Bilateral testing is considered an integral part of a complete examination. Limited examinations for reoccurring indications may be performed as noted. The reflux portion of the exam is performed with the patient in reverse Trendelenburg.  +---------+---------------+---------+-----------+----------+--------------+ RIGHT    CompressibilityPhasicitySpontaneityPropertiesThrombus Aging +---------+---------------+---------+-----------+----------+--------------+ CFV      Full           Yes      Yes                                 +---------+---------------+---------+-----------+----------+--------------+ SFJ      Full                                                        +---------+---------------+---------+-----------+----------+--------------+ FV Prox  Full                                                        +---------+---------------+---------+-----------+----------+--------------+  FV Mid   Full           Yes      Yes                                 +---------+---------------+---------+-----------+----------+--------------+ FV Distal               Yes      Yes                                 +---------+---------------+---------+-----------+----------+--------------+ PFV      Full                                                        +---------+---------------+---------+-----------+----------+--------------+ POP      Full           Yes      Yes                                  +---------+---------------+---------+-----------+----------+--------------+ PTV      Full                                                        +---------+---------------+---------+-----------+----------+--------------+ PERO     Full                                                        +---------+---------------+---------+-----------+----------+--------------+   +----+---------------+---------+-----------+----------+--------------+ LEFTCompressibilityPhasicitySpontaneityPropertiesThrombus Aging +----+---------------+---------+-----------+----------+--------------+ CFV Full           Yes      Yes                                 +----+---------------+---------+-----------+----------+--------------+     Summary: RIGHT: - There is no evidence of deep vein thrombosis in the lower extremity. However, portions of this examination were limited- see technologist comments above.  - No cystic structure found in the popliteal fossa.  LEFT: - No evidence of common femoral vein obstruction.  *See table(s) above for measurements and observations.    Preliminary     Microbiology: Recent Results (from the past 240 hour(s))  SARS Coronavirus 2 by RT PCR (hospital order, performed in [facility] hospital lab) Nasopharyngeal Nasopharyngeal Swab     Status: None   Collection Time: 03/29/20  1:31 AM   Specimen: Nasopharyngeal Swab  Result Value Ref Range Status   SARS Coronavirus 2 NEGATIVE NEGATIVE Final    Comment: (NOTE) SARS-CoV-2 target nucleic acids are NOT DETECTED.  The SARS-CoV-2 RNA is generally detectable in upper and lower respiratory specimens during the acute phase of infection. The lowest concentration of SARS-CoV-2 viral copies this assay can detect is 250 copies / mL. A negative result does not preclude SARS-CoV-2 infection and should not be used  as the sole basis for treatment or other patient management decisions.  A negative result may occur with improper specimen  collection / handling, submission of specimen other than nasopharyngeal swab, presence of viral mutation(s) within the areas targeted by this assay, and inadequate number of viral copies (<250 copies / mL). A negative result must be combined with clinical observations, patient history, and epidemiological information.  Fact Sheet for Patients:   StrictlyIdeas.no  Fact Sheet for Healthcare Providers: BankingDealers.co.za  This test is not yet approved or  cleared by the [place] FDA and has been authorized for detection and/or diagnosis of SARS-CoV-2 by FDA under an Emergency Use Authorization (EUA).  This EUA will remain in effect (meaning this test can be used) for the duration of the COVID-19 declaration under Section 564(b)(1) of the Act, 21 U.S.C. section 360bbb-3(b)(1), unless the authorization is terminated or revoked sooner.  Performed at [facility], [place] [address], [place], [place] [address]      Labs: Basic Metabolic Panel: Recent Labs  Lab 03/28/20 1410 03/30/20 0543 03/31/20 0552  NA 138 139 139  K 3.5 3.7 3.6  CL 105 108 110  CO2 20* 19* 18*  GLUCOSE 114* 78 89  BUN 24* 19 15  CREATININE 1.13* 1.10* 0.88  CALCIUM 9.5 8.6* 8.5*   Liver Function Tests: Recent Labs  Lab 03/29/20 0206  AST 28  ALT 18  ALKPHOS 43  BILITOT 0.8  PROT 5.4*  ALBUMIN 3.0*   Recent Labs  Lab 03/29/20 0206  LIPASE 13   No results for input(s): AMMONIA in the last 168 hours. CBC: Recent Labs  Lab 03/28/20 1410 03/29/20 0206 03/30/20 0543 03/30/20 2011 03/31/20 0552  WBC 4.8  --  4.0  --  4.0  NEUTROABS  --   --   --   --  2.3  HGB 9.2* 8.8* 7.0* 8.2* 8.0*  HCT 28.7* 27.7* 21.7* 24.7* 24.8*  MCV 89.1  --  87.9  --  87.3  PLT 196  --  148*  --  147*   Cardiac Enzymes: No results for input(s): CKTOTAL, CKMB, CKMBINDEX, TROPONINI in the last 168 hours. BNP: BNP (last 3 results) No results  for input(s): BNP in the last 8760 hours.  ProBNP (last 3 results) No results for input(s): PROBNP in the last 8760 hours.  CBG: No results for input(s): GLUCAP in the last 168 hours.     Signed:  Nita Sells MD   [facility] 03/31/2020, 12:27 PM

## 2020-03-31 NOTE — Progress Notes (Signed)
Right lower extremity venous duplex has been completed. Preliminary results can be found in CV Proc through chart review.   03/31/20 9:38 AM Taylor Huynh RVT

## 2020-04-01 ENCOUNTER — Encounter (HOSPITAL_COMMUNITY): Payer: Self-pay | Admitting: Gastroenterology

## 2020-04-01 ENCOUNTER — Telehealth: Payer: Self-pay | Admitting: Gastroenterology

## 2020-04-01 LAB — SURGICAL PATHOLOGY

## 2020-04-01 NOTE — Telephone Encounter (Signed)
Spoke to patient to let her know that if Dr Bryan Lemma called her and left a voicemail message then he will call her back. She voiced understanding.

## 2020-04-05 ENCOUNTER — Telehealth: Payer: Self-pay | Admitting: *Deleted

## 2020-04-05 NOTE — Telephone Encounter (Signed)
Patient recently in hospital, seen by [place] on call physician. Called patient to follow up on message received via on-call MD that patient wanted to discuss options for treatment. Patient previously asked to cancel appts with Dr. Verlee Monte patient. She states she was being treated for acid reflux and ulcers following recent hospitalization. She stated that she did not think she needed to see a cancer doctor at this time, but will contact office if she does decide to see Dr. Irene Limbo again. Encouraged her to contact office for an appt with Dr. Irene Limbo if she would like one in the future. Patient verbalized understanding.

## 2020-04-12 ENCOUNTER — Telehealth: Payer: Self-pay | Admitting: Gastroenterology

## 2020-04-12 ENCOUNTER — Telehealth: Payer: Self-pay

## 2020-04-12 NOTE — Telephone Encounter (Signed)
Spoke to patient and daughter this afternoon. Patient was admitted to [facility] on 03/28/20 for a GI bleed. Discharged on 03/31/20 stable HGB 8.0. She had instructions to repeat labs Via PCP in 1 week. Start Protonix 40 mg BID and Carafate 1g QID for 2 weeks. She Saw Dr Vertis Kelch on 03/07/20 for labs. Today she reports nausea and very dark stools. She feels very weak,unable to eat or drink much. She notified her PCP, they did not have lab results available and told her to go to urgent care for her symptoms. She went to Urgent care and they told her it would be a 3 hour wait and she needed to sit in her car,so they left.Patient would like some medication to help control her nausea. She was advised to return to Urgent care for symptoms of feeling weak or dizzy. Nausea,vomiting fever block stools, CP. Dr Bryan Lemma please advice on nausea medication and further recommendations.

## 2020-04-12 NOTE — Telephone Encounter (Signed)
See phone note, routed to Dr Bryan Lemma

## 2020-04-12 NOTE — Telephone Encounter (Signed)
Agree with recommendations for return to ER or UC with ongoing sxs, as she has a history of Non-Hodgkins Lymphoma with stomach involvement with subsequent anemia. Would want to make sure she isnt again anemic and in need of blood transfusion and admission.    Otherwise, ok to give Rx of Zofran 4 mg PO prn Q6H for nausea, #60, Rf3 and recommend close f/u in the Oncology Clinic this week. Resume PPI and Carafate as prescribed. But again, with those sxs, she really needs evaluation. Thanks.

## 2020-04-13 MED ORDER — ONDANSETRON HCL 4 MG PO TABS: 4.0000 mg | ORAL_TABLET | Freq: Four times a day (QID) | ORAL | 3 refills | Status: AC | PRN

## 2020-04-13 NOTE — Telephone Encounter (Signed)
Spoke with patient's daughter to inform her that a prescription for Zofran 4 mg has been sent to the pharmacy. Patient continues to have black stools and feels weak. It was advised again to return to  UC or the ER for ongoing symptoms. The daughter stated she will call the office and let us know if patient agrees to go.

## 2020-04-14 ENCOUNTER — Telehealth: Payer: Self-pay | Admitting: *Deleted

## 2020-04-14 ENCOUNTER — Telehealth: Payer: Self-pay | Admitting: Nurse Practitioner

## 2020-04-14 NOTE — Telephone Encounter (Signed)
Received message from [place] Patient coordinator: "Taylor Huynh is being referred for iron infusions by Dr. Fara Olden" Established patient of Dr. Irene Limbo. Please fwd an appt to scheduling if the pt would like an appt.   Had contacted Taylor Huynh last week to inquire about f/u for oncology care. She declined to make appt with Dr.Kale at that time.   Contacted patient. Daughter answered, stated Huynh did not feel well and could she take message. Spoke with Taylor regarding message that Dr. Fara Olden asked for Taylor Huynh to be scheduled for iron. Informed Taylor that Huynh will need lab appt and appt with Dr. Irene Limbo to evaluate need prior to iron infusions being scheduled.  Informed daughter that Huynh had declined to see Dr. Irene Limbo for oncology f/u in conversation last week.  Daughter would like Taylor Huynh to get the iron and will speak with Taylor about it.  Noted in patient's chart message from PCP today advising patient go to UC or ED for evaluation of nausea and black tarry stools. Encouraged daughter to take Huynh to ED or UC as recommended by PCP as appointment with Dr. Irene Limbo and iron infusions are secondary if patient has active bleed.    Dr. Irene Limbo informed of above information - will schedule appts for evaluation for iron once patient has evaluated in ED or UC for black tarry stools

## 2020-04-16 ENCOUNTER — Telehealth: Payer: Self-pay | Admitting: *Deleted

## 2020-04-16 ENCOUNTER — Other Ambulatory Visit: Payer: Self-pay | Admitting: Hematology

## 2020-04-16 ENCOUNTER — Other Ambulatory Visit: Payer: Self-pay | Admitting: *Deleted

## 2020-04-16 ENCOUNTER — Telehealth: Payer: Self-pay | Admitting: Hematology

## 2020-04-16 DIAGNOSIS — D500 Iron deficiency anemia secondary to blood loss (chronic): Secondary | ICD-10-CM

## 2020-04-16 DIAGNOSIS — D509 Iron deficiency anemia, unspecified: Secondary | ICD-10-CM | POA: Insufficient documentation

## 2020-04-16 DIAGNOSIS — C8330 Diffuse large B-cell lymphoma, unspecified site: Secondary | ICD-10-CM

## 2020-04-16 NOTE — Telephone Encounter (Signed)
Scheduled appt per 9/24 sch msg - pt daughter is aware of appt .

## 2020-04-16 NOTE — Telephone Encounter (Signed)
Received call from Mel Almond, [place] w/[facility]. She states pt HR increase to 106 with standing from resting HR 83; b/p low (no measurement); weak; nauseated;short of breath. She contacted patient's PCP with this information and was told to contact Dr. Grier Mitts office so that patient could get iron. Dr. Irene Limbo informed.  Dr. Irene Limbo asked to have patient scheduled as soon as possible for f/u appt with labs and IV iron scheduled following appt. He asked taht patient be called and informed that decision regarding iron infusion would be made after evaluation. He asked that patient be advised to go to ED for increased weakness or shortness of breath or tarry schools (reported earlier in week). Appts made for 9/29.  Note: patient's daughter (no named) called after [facility] PT called with same information and requested appt. Scheduler spoke with daughter when appts scheduled.  Contacted patient with directions from Dr. Irene Limbo. Patient verbalized understanding.

## 2020-04-19 ENCOUNTER — Encounter (HOSPITAL_COMMUNITY): Payer: Medicare Other

## 2020-04-19 ENCOUNTER — Inpatient Hospital Stay (HOSPITAL_COMMUNITY)
Admission: EM | Admit: 2020-04-19 | Discharge: 2020-04-21 | DRG: 811 | Disposition: A | Discharge status: Hospice | Payer: Medicare Other | Attending: Internal Medicine | Admitting: Internal Medicine

## 2020-04-19 ENCOUNTER — Other Ambulatory Visit: Payer: Self-pay

## 2020-04-19 ENCOUNTER — Emergency Department (HOSPITAL_COMMUNITY): Payer: Medicare Other

## 2020-04-19 ENCOUNTER — Encounter (HOSPITAL_COMMUNITY): Payer: Self-pay

## 2020-04-19 DIAGNOSIS — Z66 Do not resuscitate: Secondary | ICD-10-CM | POA: Diagnosis present

## 2020-04-19 DIAGNOSIS — K259 Gastric ulcer, unspecified as acute or chronic, without hemorrhage or perforation: Secondary | ICD-10-CM | POA: Diagnosis present

## 2020-04-19 DIAGNOSIS — Z7189 Other specified counseling: Secondary | ICD-10-CM

## 2020-04-19 DIAGNOSIS — E860 Dehydration: Secondary | ICD-10-CM | POA: Diagnosis present

## 2020-04-19 DIAGNOSIS — K264 Chronic or unspecified duodenal ulcer with hemorrhage: Secondary | ICD-10-CM | POA: Diagnosis present

## 2020-04-19 DIAGNOSIS — K2100 Gastro-esophageal reflux disease with esophagitis, without bleeding: Secondary | ICD-10-CM | POA: Diagnosis present

## 2020-04-19 DIAGNOSIS — Z23 Encounter for immunization: Secondary | ICD-10-CM | POA: Diagnosis present

## 2020-04-19 DIAGNOSIS — D649 Anemia, unspecified: Secondary | ICD-10-CM | POA: Diagnosis not present

## 2020-04-19 DIAGNOSIS — K254 Chronic or unspecified gastric ulcer with hemorrhage: Secondary | ICD-10-CM | POA: Diagnosis present

## 2020-04-19 DIAGNOSIS — K2970 Gastritis, unspecified, without bleeding: Secondary | ICD-10-CM | POA: Diagnosis not present

## 2020-04-19 DIAGNOSIS — N179 Acute kidney failure, unspecified: Secondary | ICD-10-CM | POA: Diagnosis present

## 2020-04-19 DIAGNOSIS — K2981 Duodenitis with bleeding: Secondary | ICD-10-CM

## 2020-04-19 DIAGNOSIS — C8590 Non-Hodgkin lymphoma, unspecified, unspecified site: Secondary | ICD-10-CM

## 2020-04-19 DIAGNOSIS — Z515 Encounter for palliative care: Secondary | ICD-10-CM | POA: Diagnosis not present

## 2020-04-19 DIAGNOSIS — K449 Diaphragmatic hernia without obstruction or gangrene: Secondary | ICD-10-CM

## 2020-04-19 DIAGNOSIS — Z9221 Personal history of antineoplastic chemotherapy: Secondary | ICD-10-CM

## 2020-04-19 DIAGNOSIS — C7800 Secondary malignant neoplasm of unspecified lung: Secondary | ICD-10-CM | POA: Diagnosis present

## 2020-04-19 DIAGNOSIS — R296 Repeated falls: Secondary | ICD-10-CM | POA: Diagnosis present

## 2020-04-19 DIAGNOSIS — K2990 Gastroduodenitis, unspecified, without bleeding: Secondary | ICD-10-CM | POA: Diagnosis present

## 2020-04-19 DIAGNOSIS — K219 Gastro-esophageal reflux disease without esophagitis: Secondary | ICD-10-CM | POA: Diagnosis present

## 2020-04-19 DIAGNOSIS — K922 Gastrointestinal hemorrhage, unspecified: Secondary | ICD-10-CM | POA: Diagnosis not present

## 2020-04-19 DIAGNOSIS — D696 Thrombocytopenia, unspecified: Secondary | ICD-10-CM | POA: Diagnosis not present

## 2020-04-19 DIAGNOSIS — C8338 Diffuse large B-cell lymphoma, lymph nodes of multiple sites: Secondary | ICD-10-CM | POA: Diagnosis present

## 2020-04-19 DIAGNOSIS — R531 Weakness: Secondary | ICD-10-CM | POA: Diagnosis not present

## 2020-04-19 DIAGNOSIS — G40909 Epilepsy, unspecified, not intractable, without status epilepticus: Secondary | ICD-10-CM | POA: Diagnosis present

## 2020-04-19 DIAGNOSIS — D6959 Other secondary thrombocytopenia: Secondary | ICD-10-CM | POA: Diagnosis present

## 2020-04-19 DIAGNOSIS — Z79899 Other long term (current) drug therapy: Secondary | ICD-10-CM

## 2020-04-19 DIAGNOSIS — I10 Essential (primary) hypertension: Secondary | ICD-10-CM | POA: Diagnosis present

## 2020-04-19 DIAGNOSIS — Z20822 Contact with and (suspected) exposure to covid-19: Secondary | ICD-10-CM | POA: Diagnosis present

## 2020-04-19 DIAGNOSIS — R627 Adult failure to thrive: Secondary | ICD-10-CM | POA: Diagnosis present

## 2020-04-19 DIAGNOSIS — Z79890 Hormone replacement therapy (postmenopausal): Secondary | ICD-10-CM

## 2020-04-19 DIAGNOSIS — D630 Anemia in neoplastic disease: Secondary | ICD-10-CM | POA: Diagnosis present

## 2020-04-19 DIAGNOSIS — Z91018 Allergy to other foods: Secondary | ICD-10-CM

## 2020-04-19 DIAGNOSIS — D62 Acute posthemorrhagic anemia: Principal | ICD-10-CM | POA: Diagnosis present

## 2020-04-19 DIAGNOSIS — K269 Duodenal ulcer, unspecified as acute or chronic, without hemorrhage or perforation: Secondary | ICD-10-CM | POA: Diagnosis present

## 2020-04-19 LAB — CBC WITH DIFFERENTIAL/PLATELET
Abs Immature Granulocytes: 0.09 10*3/uL — ABNORMAL HIGH (ref 0.00–0.07)
Basophils Absolute: 0 10*3/uL (ref 0.0–0.1)
Basophils Relative: 0 %
Eosinophils Absolute: 0 10*3/uL (ref 0.0–0.5)
Eosinophils Relative: 1 %
HCT: 22.8 % — ABNORMAL LOW (ref 36.0–46.0)
Hemoglobin: 7.1 g/dL — ABNORMAL LOW (ref 12.0–15.0)
Immature Granulocytes: 2 %
Lymphocytes Relative: 16 %
Lymphs Abs: 0.7 10*3/uL (ref 0.7–4.0)
MCH: 28.3 pg (ref 26.0–34.0)
MCHC: 31.1 g/dL (ref 30.0–36.0)
MCV: 90.8 fL (ref 80.0–100.0)
Monocytes Absolute: 0.6 10*3/uL (ref 0.1–1.0)
Monocytes Relative: 12 %
Neutro Abs: 3.2 10*3/uL (ref 1.7–7.7)
Neutrophils Relative %: 69 %
Platelets: 53 10*3/uL — ABNORMAL LOW (ref 150–400)
RBC: 2.51 MIL/uL — ABNORMAL LOW (ref 3.87–5.11)
RDW: 20.1 % — ABNORMAL HIGH (ref 11.5–15.5)
WBC: 4.6 10*3/uL (ref 4.0–10.5)
nRBC: 0 % (ref 0.0–0.2)

## 2020-04-19 LAB — COMPREHENSIVE METABOLIC PANEL
ALT: 23 U/L (ref 0–44)
AST: 47 U/L — ABNORMAL HIGH (ref 15–41)
Albumin: 2.3 g/dL — ABNORMAL LOW (ref 3.5–5.0)
Alkaline Phosphatase: 38 U/L (ref 38–126)
Anion gap: 13 (ref 5–15)
BUN: 39 mg/dL — ABNORMAL HIGH (ref 8–23)
CO2: 25 mmol/L (ref 22–32)
Calcium: 11.2 mg/dL — ABNORMAL HIGH (ref 8.9–10.3)
Chloride: 103 mmol/L (ref 98–111)
Creatinine, Ser: 1.21 mg/dL — ABNORMAL HIGH (ref 0.44–1.00)
GFR calc Af Amer: 49 mL/min — ABNORMAL LOW (ref >60)
GFR calc non Af Amer: 42 mL/min — ABNORMAL LOW (ref >60)
Glucose, Bld: 81 mg/dL (ref 70–99)
Potassium: 4.3 mmol/L (ref 3.5–5.1)
Sodium: 141 mmol/L (ref 135–145)
Total Bilirubin: 1.1 mg/dL (ref 0.3–1.2)
Total Protein: 4.7 g/dL — ABNORMAL LOW (ref 6.5–8.1)

## 2020-04-19 LAB — LIPASE, BLOOD: Lipase: 40 U/L (ref 11–51)

## 2020-04-19 LAB — PREPARE RBC (CROSSMATCH)

## 2020-04-19 LAB — PROTIME-INR
INR: 1.2 (ref 0.8–1.2)
Prothrombin Time: 14.7 seconds (ref 11.4–15.2)

## 2020-04-19 LAB — RESPIRATORY PANEL BY RT PCR (FLU A&B, COVID)
Influenza A by PCR: NEGATIVE
Influenza B by PCR: NEGATIVE
SARS Coronavirus 2 by RT PCR: NEGATIVE

## 2020-04-19 LAB — TROPONIN I (HIGH SENSITIVITY)
Troponin I (High Sensitivity): 10 ng/L (ref <18)
Troponin I (High Sensitivity): 9 ng/L (ref <18)

## 2020-04-19 LAB — POC OCCULT BLOOD, ED: Fecal Occult Bld: POSITIVE — AB

## 2020-04-19 MED ORDER — FLUTICASONE PROPIONATE 50 MCG/ACT NA SUSP
1.0000 | Freq: Every day | NASAL | Status: DC | PRN
  Filled 2020-04-19: qty 16

## 2020-04-19 MED ORDER — VITAMIN B-12 1000 MCG PO TABS
1000.0000 ug | ORAL_TABLET | ORAL | Status: DC
  Administered 2020-04-21: 1000 ug via ORAL
  Filled 2020-04-19: qty 1

## 2020-04-19 MED ORDER — PROMETHAZINE HCL 25 MG PO TABS: 12.5000 mg | ORAL_TABLET | Freq: Four times a day (QID) | ORAL | Status: DC | PRN

## 2020-04-19 MED ORDER — SODIUM CHLORIDE 0.9 % IV BOLUS
1000.0000 mL | Freq: Once | INTRAVENOUS | Status: AC
  Administered 2020-04-19: 1000 mL via INTRAVENOUS

## 2020-04-19 MED ORDER — INFLUENZA VAC A&B SA ADJ QUAD 0.5 ML IM PRSY
0.5000 mL | PREFILLED_SYRINGE | INTRAMUSCULAR | Status: AC
  Administered 2020-04-20: 0.5 mL via INTRAMUSCULAR
  Filled 2020-04-19: qty 0.5

## 2020-04-19 MED ORDER — SODIUM CHLORIDE 0.9 % IV SOLN
10.0000 mL/h | Freq: Once | INTRAVENOUS | Status: AC
  Administered 2020-04-19: 10 mL/h via INTRAVENOUS

## 2020-04-19 MED ORDER — FERROUS SULFATE 325 (65 FE) MG PO TABS
325.0000 mg | ORAL_TABLET | Freq: Every day | ORAL | Status: DC
  Administered 2020-04-20 – 2020-04-21 (×2): 325 mg via ORAL
  Filled 2020-04-19 (×2): qty 1

## 2020-04-19 MED ORDER — HYDROCODONE-ACETAMINOPHEN 5-325 MG PO TABS: 1.0000 | ORAL_TABLET | ORAL | Status: DC | PRN

## 2020-04-19 MED ORDER — VITAMIN D 25 MCG (1000 UNIT) PO TABS
1000.0000 [IU] | ORAL_TABLET | Freq: Every day | ORAL | Status: DC
  Administered 2020-04-20 – 2020-04-21 (×2): 1000 [IU] via ORAL
  Filled 2020-04-19 (×3): qty 1

## 2020-04-19 MED ORDER — SENNOSIDES-DOCUSATE SODIUM 8.6-50 MG PO TABS
1.0000 | ORAL_TABLET | Freq: Every evening | ORAL | Status: DC | PRN
  Filled 2020-04-19: qty 1

## 2020-04-19 MED ORDER — SUCRALFATE 1 G PO TABS
1.0000 g | ORAL_TABLET | Freq: Three times a day (TID) | ORAL | Status: DC
  Administered 2020-04-19 – 2020-04-21 (×5): 1 g via ORAL
  Filled 2020-04-19 (×7): qty 1

## 2020-04-19 MED ORDER — SORBITOL 70 % SOLN
30.0000 mL | Freq: Every day | Status: DC | PRN
  Filled 2020-04-19: qty 30

## 2020-04-19 MED ORDER — AMLODIPINE BESYLATE 10 MG PO TABS
10.0000 mg | ORAL_TABLET | ORAL | Status: DC
  Administered 2020-04-20: 10 mg via ORAL
  Filled 2020-04-19: qty 2

## 2020-04-19 MED ORDER — MORPHINE SULFATE (PF) 2 MG/ML IV SOLN: 2.0000 mg | INTRAVENOUS | Status: DC | PRN

## 2020-04-19 MED ORDER — ACETAMINOPHEN 325 MG PO TABS: 650.0000 mg | ORAL_TABLET | Freq: Four times a day (QID) | ORAL | Status: DC | PRN

## 2020-04-19 MED ORDER — ACETAMINOPHEN 650 MG RE SUPP: 650.0000 mg | Freq: Four times a day (QID) | RECTAL | Status: DC | PRN

## 2020-04-19 MED ORDER — PANTOPRAZOLE SODIUM 40 MG PO TBEC
40.0000 mg | DELAYED_RELEASE_TABLET | Freq: Two times a day (BID) | ORAL | Status: DC
  Administered 2020-04-19 – 2020-04-21 (×4): 40 mg via ORAL
  Filled 2020-04-19 (×5): qty 1

## 2020-04-19 MED ORDER — PREDNISOLONE ACETATE 1 % OP SUSP
1.0000 [drp] | Freq: Every day | OPHTHALMIC | Status: DC
  Administered 2020-04-20 – 2020-04-21 (×2): 1 [drp] via OPHTHALMIC
  Filled 2020-04-19: qty 5

## 2020-04-19 MED ORDER — MELATONIN 5 MG PO TABS
5.0000 mg | ORAL_TABLET | Freq: Every day | ORAL | Status: DC
  Administered 2020-04-19 – 2020-04-20 (×2): 5 mg via ORAL
  Filled 2020-04-19 (×3): qty 1

## 2020-04-19 NOTE — ED Notes (Signed)
Pt has not urinated yet

## 2020-04-19 NOTE — ED Notes (Signed)
Pt states she feels like she has to urinate. She declines trying a bedpan. Advised she has a purewick in place under her brief and that she can pee and it will collect into container. Checked bladder with bladder scan and reported to nurse volume was 25-28.

## 2020-04-19 NOTE — H&P (Signed)
Taylor Huynh is an 79 y.o. female.   Chief Complaint: Generalized weakness, poor PO intake, intermittent emesis, falls, and increased shortness with swelling of her right leg. HPI: The patient is a 79 yr old woman who was discharged from this facility on 03/31/2020 after a 3 day stay for Upper GI Bleed and AKI. She was found to have gastroduodenitis and multiple gastric and duodenal ulcers. Her hemoglobin at the time fo discharge was 8.0. It is 7.1 today. Her FOBT is positive, and the patient gives a history of dark stool, but she is taking supplemental iron at home.  The patient has a past medical history significant for NHL, GERD, hiatal hernia, hypertension, leukopenia, seizures, and syncope.  In the ED, the patient has been found to have a temperature of 99., tachypnea in the 30's, heart rates ranging from 98 to 110, and a blood pressure which has dropped from 143/75 to 101.57. She is saturating 96% on 2L.  Creatinine is elevated at 1.21 from her previous creatinine to 0.88. Albumin is 2.3. WBC is 4.6 and hemoglobin and 7.1. Platelets are 53. INR is 1.2. Troponins are 10 and 9. Trending down.  CXR demonstrates left greater than right bilateral lung nodules and pleural masses consistent with metastatic disease. No acute airspace disease.  The patient denies fevers, chills, nausea or vomiting. She has had melena, weakness, falls, and poor PO intake.   [facility] have been consulted to admit the patient for further evaluation and treatment.  Past Medical History:  Diagnosis Date  . GERD (gastroesophageal reflux disease)   . HH (hiatus hernia) 12/12/2011  . Hypertension   . Leukopenia 12/12/2011  . nhl dx'd 2010  . Seizures ([place]) reports "years ago"  . Syncope 12/12/2011   Hx seizures - not documented - age 70; recent syncope 6/12 and x2, 12/12; negative MRI brain 1/13    Past Surgical History:  Procedure Laterality Date  . BIOPSY  03/30/2020   Procedure: BIOPSY;  Surgeon:  Lavena Bullion, DO;  Location: [facility];  Service: Gastroenterology;;  . COLON SURGERY  2010   due to cancer  . ESOPHAGOGASTRODUODENOSCOPY (EGD) WITH PROPOFOL N/A 03/30/2020   Procedure: ESOPHAGOGASTRODUODENOSCOPY (EGD) WITH PROPOFOL;  Surgeon: Lavena Bullion, DO;  Location: [facility];  Service: Gastroenterology;  Laterality: N/A;  . IR IMAGING GUIDED PORT INSERTION  08/23/2018  . IR PATIENT EVAL TECH 0-60 MINS  04/22/2019  . IR PATIENT EVAL TECH 0-60 MINS  04/25/2019  . IR PATIENT EVAL TECH 0-60 MINS  04/28/2019  . IR PATIENT EVAL TECH 0-60 MINS  05/02/2019  . IR REMOVAL TUN ACCESS W/ PORT W/O FL MOD SED  04/18/2019    Family History  Problem Relation Age of Onset  . Diabetes Mother   . Diabetes Father    Social History:  reports that she has never smoked. She has never used smokeless tobacco. She reports that she does not drink alcohol and does not use drugs. (Not in a hospital admission)   Allergies:  Allergies  Allergen Reactions  . Strawberry (Diagnostic) Itching  . Tomato Itching    Pertinent items noted in HPI and remainder of comprehensive ROS otherwise negative.  General appearance: alert, cooperative and mild distress Head: Normocephalic, without obvious abnormality, atraumatic Eyes: conjunctivae/corneas clear. PERRL, EOM's intact. Fundi benign. Throat: lips, mucosa, and tongue normal; teeth and gums normal Neck: no adenopathy, no carotid bruit, no JVD, supple, symmetrical, trachea midline and thyroid not enlarged, symmetric, no tenderness/mass/nodules Resp: No increased  work of breathing. No wheezes, rales, or rhonchi. No tactile fremitus. Chest wall: no tenderness Cardio: regular rate and rhythm, S1, S2 normal, no murmur, click, rub or gallop GI: soft, non-tender; bowel sounds normal; no masses,  no organomegaly Extremities: extremities normal, atraumatic, no cyanosis or edema Pulses: 2+ and symmetric Skin: Skin color, texture, turgor normal. No rashes or  lesions Lymph nodes: Cervical, supraclavicular, and axillary nodes normal. Neurologic: Alert and oriented X 3, normal strength and tone. Normal symmetric reflexes. Normal coordination and gait  Results for orders placed or performed during the hospital encounter of 04/19/20 (from the past 48 hour(s))  Comprehensive metabolic panel     Status: Abnormal   Collection Time: 04/19/20  5:38 PM  Result Value Ref Range   Sodium 141 135 - 145 mmol/L   Potassium 4.3 3.5 - 5.1 mmol/L   Chloride 103 98 - 111 mmol/L   CO2 25 22 - 32 mmol/L   Glucose, Bld 81 70 - 99 mg/dL    Comment: Glucose reference range applies only to samples taken after fasting for at least 8 hours.   BUN 39 (H) 8 - 23 mg/dL   Creatinine, Ser 1.21 (H) 0.44 - 1.00 mg/dL   Calcium 11.2 (H) 8.9 - 10.3 mg/dL   Total Protein 4.7 (L) 6.5 - 8.1 g/dL   Albumin 2.3 (L) 3.5 - 5.0 g/dL   AST 47 (H) 15 - 41 U/L   ALT 23 0 - 44 U/L   Alkaline Phosphatase 38 38 - 126 U/L   Total Bilirubin 1.1 0.3 - 1.2 mg/dL   GFR calc non Af Amer 42 (L) >60 mL/min   GFR calc Af Amer 49 (L) >60 mL/min   Anion gap 13 5 - 15    Comment: Performed at [facility], [place] [address], [place], [place] [address]  Lipase, blood     Status: None   Collection Time: 04/19/20  5:38 PM  Result Value Ref Range   Lipase 40 11 - 51 U/L    Comment: Performed at [facility], [place] [address] [place], [place] [address]  CBC with Differential     Status: Abnormal   Collection Time: 04/19/20  5:38 PM  Result Value Ref Range   WBC 4.6 4.0 - 10.5 K/uL   RBC 2.51 (L) 3.87 - 5.11 MIL/uL   Hemoglobin 7.1 (L) 12.0 - 15.0 g/dL   HCT 22.8 (L) 36 - 46 %   MCV 90.8 80.0 - 100.0 fL   MCH 28.3 26.0 - 34.0 pg   MCHC 31.1 30.0 - 36.0 g/dL   RDW 20.1 (H) 11.5 - 15.5 %   Platelets 53 (L) 150 - 400 K/uL    Comment: REPEATED TO VERIFY PLATELET COUNT CONFIRMED BY SMEAR SPECIMEN CHECKED FOR CLOTS Immature Platelet Fraction may be clinically  indicated, consider ordering this additional test [id number]    nRBC 0.0 0.0 - 0.2 %   Neutrophils Relative % 69 %   Neutro Abs 3.2 1.7 - 7.7 K/uL   Lymphocytes Relative 16 %   Lymphs Abs 0.7 0.7 - 4.0 K/uL   Monocytes Relative 12 %   Monocytes Absolute 0.6 0 - 1 K/uL   Eosinophils Relative 1 %   Eosinophils Absolute 0.0 0 - 0 K/uL   Basophils Relative 0 %   Basophils Absolute 0.0 0 - 0 K/uL   WBC Morphology TOXIC GRANULATION    Immature Granulocytes 2 %   Abs Immature Granulocytes 0.09 (H) 0.00 -  0.07 K/uL   Polychromasia PRESENT     Comment: Performed at [facility], [place] [address] [place], [place] [address]  Troponin I (High Sensitivity)     Status: None   Collection Time: 04/19/20  5:38 PM  Result Value Ref Range   Troponin I (High Sensitivity) 10 <18 ng/L    Comment: (NOTE) Elevated high sensitivity troponin I (hsTnI) values and significant  changes across serial measurements may suggest ACS but many other  chronic and acute conditions are known to elevate hsTnI results.  Refer to the "Links" section for chest pain algorithms and additional  guidance. Performed at [facility], [place] [address], [place], [place] [address]   Protime-INR     Status: None   Collection Time: 04/19/20  5:38 PM  Result Value Ref Range   Prothrombin Time 14.7 11.4 - 15.2 seconds   INR 1.2 0.8 - 1.2    Comment: (NOTE) INR goal varies based on device and disease states. Performed at [facility], [place] [address] [place], [place] [address]   Type and screen [place]     Status: None (Preliminary result)   Collection Time: 04/19/20  5:38 PM  Result Value Ref Range   ABO/RH(D) A POS    Antibody Screen NEG    Sample Expiration 04/22/2020,2359    Unit Number [id number]    Blood Component Type RED CELLS,LR    Unit division 00    Status of Unit ISSUED    Transfusion Status OK TO TRANSFUSE    Crossmatch Result       Compatible Performed at [facility], [place] [address] [place], [place] [address]   Respiratory Panel by RT PCR (Flu A&B, Covid) - Nasopharyngeal Swab     Status: None   Collection Time: 04/19/20  5:38 PM   Specimen: Nasopharyngeal Swab  Result Value Ref Range   SARS Coronavirus 2 by RT PCR NEGATIVE NEGATIVE    Comment: (NOTE) SARS-CoV-2 target nucleic acids are NOT DETECTED.  The SARS-CoV-2 RNA is generally detectable in upper respiratoy specimens during the acute phase of infection. The lowest concentration of SARS-CoV-2 viral copies this assay can detect is 131 copies/mL. A negative result does not preclude SARS-Cov-2 infection and should not be used as the sole basis for treatment or other patient management decisions. A negative result may occur with  improper specimen collection/handling, submission of specimen other than nasopharyngeal swab, presence of viral mutation(s) within the areas targeted by this assay, and inadequate number of viral copies (<131 copies/mL). A negative result must be combined with clinical observations, patient history, and epidemiological information. The expected result is Negative.  Fact Sheet for Patients:  PinkCheek.be  Fact Sheet for Healthcare Providers:  GravelBags.it  This test is no t yet approved or cleared by the [place] FDA and  has been authorized for detection and/or diagnosis of SARS-CoV-2 by FDA under an Emergency Use Authorization (EUA). This EUA will remain  in effect (meaning this test can be used) for the duration of the COVID-19 declaration under Section 564(b)(1) of the Act, 21 U.S.C. section 360bbb-3(b)(1), unless the authorization is terminated or revoked sooner.     Influenza A by PCR NEGATIVE NEGATIVE   Influenza B by PCR NEGATIVE NEGATIVE    Comment: (NOTE) The Xpert Xpress SARS-CoV-2/FLU/RSV assay is intended as an aid in  the  diagnosis of influenza from Nasopharyngeal swab specimens and  should not be used as a  sole basis for treatment. Nasal washings and  aspirates are unacceptable for Xpert Xpress SARS-CoV-2/FLU/RSV  testing.  Fact Sheet for Patients: PinkCheek.be  Fact Sheet for Healthcare Providers: GravelBags.it  This test is not yet approved or cleared by the [place] FDA and  has been authorized for detection and/or diagnosis of SARS-CoV-2 by  FDA under an Emergency Use Authorization (EUA). This EUA will remain  in effect (meaning this test can be used) for the duration of the  Covid-19 declaration under Section 564(b)(1) of the Act, 21  U.S.C. section 360bbb-3(b)(1), unless the authorization is  terminated or revoked. Performed at [facility], [place] [address] [place], [place] [address]   Prepare RBC (crossmatch)     Status: None   Collection Time: 04/19/20  5:38 PM  Result Value Ref Range   Order Confirmation      ORDER PROCESSED BY BLOOD BANK Performed at [facility], [place] [address] [place], [place] [address]   POC occult blood, ED     Status: Abnormal   Collection Time: 04/19/20  7:49 PM  Result Value Ref Range   Fecal Occult Bld POSITIVE (A) NEGATIVE  Troponin I (High Sensitivity)     Status: None   Collection Time: 04/19/20  7:56 PM  Result Value Ref Range   Troponin I (High Sensitivity) 9 <18 ng/L    Comment: (NOTE) Elevated high sensitivity troponin I (hsTnI) values and significant  changes across serial measurements may suggest ACS but many other  chronic and acute conditions are known to elevate hsTnI results.  Refer to the "Links" section for chest pain algorithms and additional  guidance. Performed at [facility], [place] [address] [place], [place] [address]    [email or web address] @  Blood pressure (!) 101/57, pulse 98, temperature 98.5 F (36.9 C), temperature  source Oral, resp. rate 19, SpO2 100 %.   Assessment/Plan Problem  Failure to Thrive in Adult  Thrombocytopenia (Hcc)  Hiatal Hernia  Gastritis and Gastroduodenitis  Multiple Gastric Ulcers  Multiple Duodenal Ulcers  Upper GI Bleed  Aki (Acute Kidney Injury) (Hcc)  Anemia  Diffuse Large B Cell Lymphoma (Hcc)  Diffuse Large B-Cell Lymphoma of Lymph Nodes of Multiple Regions (Hcc)  Hh (Hiatus Hernia)  GERD   Failure to thrive: Patient has generalized weakness and poor PO intake. She is falling.   Anemia/Recent GI Bleed due to gastric and duodenal ulcers: The patient's hemoglobin was 8.0 on discharge 03/31/2020. Today is is 7.1. She will be transfused with one unit of PRBC's. She was found to have a gastric and duodenal ulcers on her last discharge. She was discharged on protonix and sucralfate. She states that she has had dark stools, and FOBT is positive in the ED x2. However, the patient is on iron supplementation. Anemia may also be related to the patient's iron deficiency or Lymphoma.  Diffuse Large B-Cell Lymphoma of Lymph Nodes of Multiple Regions: Noted. The patient received 4 cycles of R-CHOP ending on 09/06/09 for her High Grade B-Cell NHL. 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 The patient chose to cease treatment at this point. She also had radiation therapy for the 2 remaining lymph nodes. Palliative care consult seems appropriate.  Thrombocytopenia: Likely related to Lymphoma. No anticoagulation.  AKI: Baseline creatinine appears to be 0.88. Creatinine this morning is 1.21. This is likely due to volume depletion due to the  patient's poor PO intake.  GERD: Continue PPI and sucralfate have seen and examined this patient myself.   I have seen and examined this patient myself. I have spent 78 minutes in her evaluation and admission.  DVT Prophylaxis: SCD's CODE STATUS: Full  Code Family Communication: None available Disposition: The patient is being admitted under inpatient status to a telemetry bed. Status is: Inpatient  Remains inpatient appropriate because:Inpatient level of care appropriate due to severity of illness   Dispo: The patient is from: Home              Anticipated d/c is to: TBD              Anticipated d/c date is: 3 days              Patient currently is not medically stable to d/c. Severity of Illness: The appropriate patient status for this patient is INPATIENT. Inpatient status is judged to be reasonable and necessary in order to provide the required intensity of service to ensure the patient's safety. The patient's presenting symptoms, physical exam findings, and initial radiographic and laboratory data in the context of their chronic comorbidities is felt to place them at high risk for further clinical deterioration. Furthermore, it is not anticipated that the patient will be medically stable for discharge from the hospital within 2 midnights of admission. The following factors support the patient status of inpatient.   " The patient's presenting symptoms include Weakness, poor urine output. " The worrisome physical exam findings include Dry mucous membranes, weakness. " The initial radiographic and laboratory data are worrisome because of anemia, elevated creatinine, and new pulmonary mets. " The chronic co-morbidities include NHL.  * I certify that at the point of admission it is my clinical judgment that the patient will require inpatient hospital care spanning beyond 2 midnights from the point of admission due to high intensity of service, high risk for further deterioration and high frequency of surveillance required.*           Taylor Huynh 04/19/2020, 9:54 PM

## 2020-04-19 NOTE — ED Provider Notes (Signed)
[facility] DEPT Provider Note   CSN: [id number] Arrival date & time: 04/19/20  1639     History Chief Complaint  Patient presents with  . Weakness    Taylor Huynh is a 79 y.o. female.  She is a very poor historian.  She is complaining of feeling generally weak all over and lack of appetite since leaving the hospital.  Looks like she was admitted in early September for GI bleed.  Since returning home she has had no appetite, vomits sometimes, has fallen once, and feels generally weak.  She has also had increased shortness of breath and some swelling of her right leg.  Denies any chest pain or abdominal pain.  The history is provided by the patient.  Weakness Severity:  Moderate Onset quality:  Gradual Timing:  Constant Progression:  Worsening Relieved by:  Nothing Worsened by:  Activity Ineffective treatments:  None tried Associated symptoms: anorexia, difficulty walking, falls, nausea, shortness of breath and vomiting   Associated symptoms: no abdominal pain, no chest pain, no dysuria, no fever, no foul-smelling urine, no frequency and no syncope        Past Medical History:  Diagnosis Date  . GERD (gastroesophageal reflux disease)   . HH (hiatus hernia) 12/12/2011  . Hypertension   . Leukopenia 12/12/2011  . nhl dx'd 2010  . Seizures ([place]) reports "years ago"  . Syncope 12/12/2011   Hx seizures - not documented - age 65; recent syncope 6/12 and x2, 12/12; negative MRI brain 1/13    Patient Active Problem List   Diagnosis Date Noted  . Iron deficiency anemia 04/16/2020  . Hiatal hernia   . Gastritis and gastroduodenitis   . Multiple gastric ulcers   . Multiple duodenal ulcers   . Lymphoma ([place]) 03/29/2020  . Upper GI bleed 03/29/2020  . Edema of lower extremity 03/29/2020  . AKI (acute kidney injury) ([place]) 03/29/2020  . Upper GI bleeding 03/29/2020  . Large cell (diffuse) non-Hodgkin's lymphoma ([place]) 11/04/2018  . Hypokalemia    . Anemia   . Hypocalcemia   . Hypomagnesemia   . Encounter for antineoplastic chemotherapy   . Diffuse large B cell lymphoma ([place]) 08/29/2018  . Counseling regarding advance care planning and goals of care 08/29/2018  . 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 Hx of diverticulitis of colon 12/12/2011  . Leukopenia 12/12/2011  . Syncope 12/12/2011  . HH (hiatus hernia) 12/12/2011  . 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 GERD 05/12/2008    Past Surgical History:  Procedure Laterality Date  . BIOPSY  03/30/2020   Procedure: BIOPSY;  Surgeon: Lavena Bullion, DO;  Location: [facility];  Service: Gastroenterology;;  . COLON SURGERY  2010   due to cancer  . ESOPHAGOGASTRODUODENOSCOPY (EGD) WITH PROPOFOL N/A 03/30/2020   Procedure: ESOPHAGOGASTRODUODENOSCOPY (EGD) WITH PROPOFOL;  Surgeon: Lavena Bullion, DO;  Location: [facility];  Service: Gastroenterology;  Laterality: N/A;  . IR IMAGING GUIDED PORT INSERTION  08/23/2018  . IR PATIENT EVAL TECH 0-60 MINS  04/22/2019  . IR PATIENT EVAL TECH 0-60 MINS  04/25/2019  . IR PATIENT EVAL TECH 0-60 MINS  04/28/2019  . IR PATIENT EVAL TECH 0-60 MINS  05/02/2019  . IR REMOVAL TUN ACCESS W/ PORT W/O FL MOD SED  04/18/2019     OB History   No obstetric history on file.     Family History  Problem Relation Age of  Onset  . Diabetes Mother   . Diabetes Father     Social History   Tobacco Use  . Smoking status: Never Smoker  . Smokeless tobacco: Never Used  Vaping Use  . Vaping Use: Never used  Substance Use Topics  . Alcohol use: No  . Drug use: No    Home Medications Prior to Admission medications   Medication Sig Start Date End Date Taking? Authorizing Provider  amLODipine (NORVASC) 10 MG tablet Take 10 mg by mouth every other day.  11/02/11   [provider]  cholecalciferol (VITAMIN D) 1000 UNITS tablet Take 1,000 Units by mouth daily.       [provider]  famotidine (PEPCID) 20 MG tablet Take 20 mg by mouth at bedtime as needed. 03/23/20   [provider]  ferrous sulfate 325 (65 FE) MG tablet Take 1 tablet (325 mg total) by mouth daily with breakfast. 04/07/20   Nita Sells, MD  fluticasone (FLONASE) 50 MCG/ACT nasal spray Place 1 spray into both nostrils daily. 03/23/20   [provider]  lidocaine-prilocaine (EMLA) cream Apply 1 application topically as needed. 08/28/18   Brunetta Genera, MD  Melatonin 5 MG TABS Take 5 mg by mouth at bedtime.     [provider]  ondansetron (ZOFRAN) 4 MG tablet Take 1 tablet (4 mg total) by mouth every 6 (six) hours as needed for nausea or vomiting. 04/13/20   Cirigliano, Vito V, DO  pantoprazole (PROTONIX) 40 MG tablet Take 1 tablet (40 mg total) by mouth 2 (two) times daily. 03/31/20   Nita Sells, MD  prednisoLONE acetate (PRED FORTE) 1 % ophthalmic suspension Place 1 drop into both eyes daily. 02/17/20   [provider]  sucralfate (CARAFATE) 1 g tablet Take 1 tablet (1 g total) by mouth 4 (four) times daily -  with meals and at bedtime for 14 days. 03/31/20 04/14/20  Nita Sells, MD  vitamin B-12 (CYANOCOBALAMIN) 1000 MCG tablet Take 1,000 mcg by mouth every other day.    [provider]    Allergies    Strawberry (diagnostic) and Tomato  Review of Systems   Review of Systems  Constitutional: Positive for activity change, appetite change and fatigue. Negative for fever.  HENT: Negative for sore throat.   Eyes: Negative for visual disturbance.  Respiratory: Positive for shortness of breath.   Cardiovascular: Negative for chest pain and syncope.  Gastrointestinal: Positive for anorexia, nausea and vomiting. Negative for abdominal pain.  Genitourinary: Negative for dysuria and frequency.  Musculoskeletal: Positive for falls and gait problem. Negative for neck pain.  Skin: Negative for rash.  Neurological:  Positive for weakness.    Physical Exam Updated Vital Signs BP 125/62 (BP Location: Right Arm)   Pulse (!) 110   Temp 99.9 F (37.7 C) (Oral)   Resp 18   LMP  (LMP Unknown)   SpO2 91%   Physical Exam Vitals and nursing note reviewed.  Constitutional:      General: She is not in acute distress.    Appearance: Normal appearance. She is well-developed.  HENT:     Head: Normocephalic and atraumatic.  Eyes:     Conjunctiva/sclera: Conjunctivae normal.  Cardiovascular:     Rate and Rhythm: Normal rate and regular rhythm.     Pulses: Normal pulses.     Heart sounds: No murmur heard.   Pulmonary:     Effort: Pulmonary effort is normal. No respiratory distress.     Breath sounds: Normal  breath sounds.  Abdominal:     Palpations: Abdomen is soft.     Tenderness: There is no abdominal tenderness.  Musculoskeletal:     Cervical back: Neck supple.     Right lower leg: Edema present.     Left lower leg: No edema.  Skin:    General: Skin is warm and dry.     Capillary Refill: Capillary refill takes less than 2 seconds.  Neurological:     General: No focal deficit present.     Mental Status: She is alert.     ED Results / Procedures / Treatments   Labs (all labs ordered are listed, but only abnormal results are displayed) Labs Reviewed  COMPREHENSIVE METABOLIC PANEL - Abnormal; Notable for the following components:      Result Value   BUN 39 (*)    Creatinine, Ser 1.21 (*)    Calcium 11.2 (*)    Total Protein 4.7 (*)    Albumin 2.3 (*)    AST 47 (*)    GFR calc non Af Amer 42 (*)    GFR calc Af Amer 49 (*)    All other components within normal limits  CBC WITH DIFFERENTIAL/PLATELET - Abnormal; Notable for the following components:   RBC 2.51 (*)    Hemoglobin 7.1 (*)    HCT 22.8 (*)    RDW 20.1 (*)    Platelets 53 (*)    Abs Immature Granulocytes 0.09 (*)    All other components within normal limits  CBC - Abnormal; Notable for the following components:   RBC  2.47 (*)    Hemoglobin 7.2 (*)    HCT 22.7 (*)    RDW 18.7 (*)    Platelets 42 (*)    nRBC 0.4 (*)    All other components within normal limits  COMPREHENSIVE METABOLIC PANEL - Abnormal; Notable for the following components:   BUN 37 (*)    Creatinine, Ser 1.09 (*)    Calcium 10.4 (*)    Total Protein 4.2 (*)    Albumin 2.1 (*)    AST 43 (*)    Alkaline Phosphatase 33 (*)    Total Bilirubin 1.5 (*)    GFR calc non Af Amer 48 (*)    GFR calc Af Amer 55 (*)    All other components within normal limits  PROTIME-INR - Abnormal; Notable for the following components:   Prothrombin Time 15.8 (*)    INR 1.3 (*)    All other components within normal limits  POC OCCULT BLOOD, ED - Abnormal; Notable for the following components:   Fecal Occult Bld POSITIVE (*)    All other components within normal limits  RESPIRATORY PANEL BY RT PCR (FLU A&B, COVID)  URINE CULTURE  LIPASE, BLOOD  PROTIME-INR  URINALYSIS, ROUTINE W REFLEX MICROSCOPIC  TYPE AND SCREEN  PREPARE RBC (CROSSMATCH)  TROPONIN I (HIGH SENSITIVITY)  TROPONIN I (HIGH SENSITIVITY)    EKG EKG Interpretation  Date/Time:  Monday April 19 2020 17:29:00 EDT Ventricular Rate:  106 PR Interval:    QRS Duration: 98 QT Interval:  383 QTC Calculation: 509 R Axis:   1 Text Interpretation: Sinus tachycardia Borderline T wave abnormalities Prolonged QT interval No significant change since prior 9/21 Confirmed by Aletta Edouard [phone]) on 04/19/2020 5:40:17 PM   Radiology DG Chest Port 1 View  Result Date: 04/19/2020 CLINICAL DATA:  Weakness EXAM: PORTABLE CHEST 1 VIEW COMPARISON:  03/28/2020, CT 03/29/2020 FINDINGS: Left greater than right bilateral  lung nodules and pleural masses consistent with metastatic disease, some of which appears less conspicuous compared to prior radiograph. No acute airspace disease or pleural effusion. Stable cardiomediastinal silhouette. No pneumothorax. IMPRESSION: Left greater than right bilateral  lung nodules and pleural masses consistent with metastatic disease, some of which appears less conspicuous compared to prior radiograph. No definite acute airspace disease. Electronically Signed   By: Donavan Foil M.D.   On: 04/19/2020 17:41    Procedures .Critical Care Performed by: Hayden Rasmussen, MD Authorized by: Hayden Rasmussen, MD   Critical care provider statement:    Critical care time (minutes):  45   Critical care time was exclusive of:  Separately billable procedures and treating other patients   Critical care was necessary to treat or prevent imminent or life-threatening deterioration of the following conditions:  Circulatory failure and metabolic crisis   Critical care was time spent personally by me on the following activities:  Discussions with consultants, evaluation of patient's response to treatment, examination of patient, ordering and performing treatments and interventions, ordering and review of laboratory studies, ordering and review of radiographic studies, pulse oximetry, re-evaluation of patient's condition, obtaining history from patient or surrogate, review of old charts and development of treatment plan with patient or surrogate   I assumed direction of critical care for this patient from another provider in my specialty: no     (including critical care time)  Medications Ordered in ED Medications  acetaminophen (TYLENOL) tablet 650 mg (has no administration in time range)    Or  acetaminophen (TYLENOL) suppository 650 mg (has no administration in time range)  HYDROcodone-acetaminophen (NORCO/VICODIN) 5-325 MG per tablet 1-2 tablet (has no administration in time range)  morphine 2 MG/ML injection 2 mg (has no administration in time range)  senna-docusate (Senokot-S) tablet 1 tablet (has no administration in time range)  sorbitol 70 % solution 30 mL (has no administration in time range)  promethazine (PHENERGAN) tablet 12.5 mg (has no administration in time  range)  pantoprazole (PROTONIX) EC tablet 40 mg (40 mg Oral Given 04/19/20 2341)  sucralfate (CARAFATE) tablet 1 g (1 g Oral Given 04/19/20 2342)  ferrous sulfate tablet 325 mg (has no administration in time range)  vitamin B-12 (CYANOCOBALAMIN) tablet 1,000 mcg (has no administration in time range)  melatonin tablet 5 mg (5 mg Oral Given 04/19/20 2341)  cholecalciferol (VITAMIN D3) tablet 1,000 Units (1,000 Units Oral Not Given 04/19/20 2342)  fluticasone (FLONASE) 50 MCG/ACT nasal spray 1 spray (has no administration in time range)  prednisoLONE acetate (PRED FORTE) 1 % ophthalmic suspension 1 drop (has no administration in time range)  amLODipine (NORVASC) tablet 10 mg (has no administration in time range)  influenza vaccine adjuvanted (FLUAD) injection 0.5 mL (has no administration in time range)  0.9 %  sodium chloride infusion (has no administration in time range)  0.9 %  sodium chloride infusion (has no administration in time range)  sodium chloride 0.9 % bolus 1,000 mL (0 mLs Intravenous Stopped 04/19/20 2003)  0.9 %  sodium chloride infusion (0 mL/hr Intravenous Stopped 04/20/20 0114)  sodium chloride 0.9 % bolus 1,000 mL (0 mLs Intravenous Stopped 04/20/20 0114)    ED Course  I have reviewed the triage vital signs and the nursing notes.  Pertinent labs & imaging results that were available during my care of the patient were reviewed by me and considered in my medical decision making (see chart for details).  Clinical Course as of Apr 20 921  Mon Apr 19, 2020  1900 Patient is trace positive.   [MB]  1928 Rectal exam done with nurse as chaperone.  Normal tone no obvious masses.  Sample sent for guaiac.  Labs showing low hemoglobin low platelets.  Also with mild elevation in creatinine and BUN and elevation in calcium.  She had CT of her chest during last admission that showed multiple likely pulmonary mets.  I reviewed this with the patient's daughter is here.  It did not sound like they  understood these to be cancer.  Patient with history of lymphoma and did not want to go through repeat treatment if there was a recurrence.  Were going to see Dr. Irene Limbo as outpatient   [MB]  [phone] Discussed with Dr. Benny Lennert [facility] who will evaluate the patient for admission.   [MB]    Clinical Course User Index [MB] Hayden Rasmussen, MD   MDM Rules/Calculators/A&P                         This patient complains of generalized weakness fall dark stools in the setting of a recent admission for GI bleed; this involves an extensive number of treatment Options and is a complaint that carries with it a high risk of complications and Morbidity. The differential includes continued GI bleed, anemia, metabolic derangement, dehydration, infection  I ordered, reviewed and interpreted labs, which included CBC with hemoglobin lower than baseline, chemistries with mild elevation of creatinine and calcium, fecal occult positive I ordered medication IV fluids and transfusion of packed red blood cells I ordered imaging studies which included chest x-ray and I independently    visualized and interpreted imaging which showed multiple pulmonary nodules noted on recent admission Additional history obtained from patient's daughter Previous records obtained and reviewed in epic including recent admission for upper GI bleed I consulted [facility] hospital[facility] Dr. Benny Lennert and discussed lab and imaging findings  Critical Interventions: Transfusion of packed red blood cells for acute anemia, IV fluids for hypercalcemia  After the interventions stated above, I reevaluated the patient and found patient to be hemodynamically stable.  I reviewed the results with her and her daughter.  They are in agreement for admission and transfusion.  Final Clinical Impression(s) / ED Diagnoses Final diagnoses:  Generalized weakness  Symptomatic anemia  Hypercalcemia  Lymphoma, unspecified body region, unspecified lymphoma type  [facility])    Rx / DC Orders ED Discharge Orders    None       Hayden Rasmussen, MD 04/20/20 [phone]

## 2020-04-19 NOTE — ED Triage Notes (Signed)
Arrives via EMS from home, C/C generalized weakness x1 month w/ progressive worsening over the last week. Also complains of SOB and R ankle swelling x1 month, states ankle swelling is related to CA. Has been using a walker to ambulate over x1 week, usually ambulates w/o assistance. CBG 116 w/ EMS. Room air was 91%, 98% on 2 L [place].

## 2020-04-20 DIAGNOSIS — Z7189 Other specified counseling: Secondary | ICD-10-CM

## 2020-04-20 DIAGNOSIS — Z515 Encounter for palliative care: Secondary | ICD-10-CM

## 2020-04-20 DIAGNOSIS — D649 Anemia, unspecified: Secondary | ICD-10-CM

## 2020-04-20 DIAGNOSIS — R531 Weakness: Secondary | ICD-10-CM

## 2020-04-20 DIAGNOSIS — C8338 Diffuse large B-cell lymphoma, lymph nodes of multiple sites: Secondary | ICD-10-CM

## 2020-04-20 LAB — URINALYSIS, ROUTINE W REFLEX MICROSCOPIC
Bilirubin Urine: NEGATIVE
Glucose, UA: NEGATIVE mg/dL
Hgb urine dipstick: NEGATIVE
Ketones, ur: NEGATIVE mg/dL
Nitrite: NEGATIVE
Protein, ur: NEGATIVE mg/dL
Specific Gravity, Urine: 1.017 (ref 1.005–1.030)
pH: 5 (ref 5.0–8.0)

## 2020-04-20 LAB — COMPREHENSIVE METABOLIC PANEL
ALT: 21 U/L (ref 0–44)
AST: 43 U/L — ABNORMAL HIGH (ref 15–41)
Albumin: 2.1 g/dL — ABNORMAL LOW (ref 3.5–5.0)
Alkaline Phosphatase: 33 U/L — ABNORMAL LOW (ref 38–126)
Anion gap: 12 (ref 5–15)
BUN: 37 mg/dL — ABNORMAL HIGH (ref 8–23)
CO2: 22 mmol/L (ref 22–32)
Calcium: 10.4 mg/dL — ABNORMAL HIGH (ref 8.9–10.3)
Chloride: 107 mmol/L (ref 98–111)
Creatinine, Ser: 1.09 mg/dL — ABNORMAL HIGH (ref 0.44–1.00)
GFR calc Af Amer: 55 mL/min — ABNORMAL LOW (ref >60)
GFR calc non Af Amer: 48 mL/min — ABNORMAL LOW (ref >60)
Glucose, Bld: 84 mg/dL (ref 70–99)
Potassium: 3.9 mmol/L (ref 3.5–5.1)
Sodium: 141 mmol/L (ref 135–145)
Total Bilirubin: 1.5 mg/dL — ABNORMAL HIGH (ref 0.3–1.2)
Total Protein: 4.2 g/dL — ABNORMAL LOW (ref 6.5–8.1)

## 2020-04-20 LAB — CBC
HCT: 22.7 % — ABNORMAL LOW (ref 36.0–46.0)
Hemoglobin: 7.2 g/dL — ABNORMAL LOW (ref 12.0–15.0)
MCH: 29.1 pg (ref 26.0–34.0)
MCHC: 31.7 g/dL (ref 30.0–36.0)
MCV: 91.9 fL (ref 80.0–100.0)
Platelets: 42 10*3/uL — ABNORMAL LOW (ref 150–400)
RBC: 2.47 MIL/uL — ABNORMAL LOW (ref 3.87–5.11)
RDW: 18.7 % — ABNORMAL HIGH (ref 11.5–15.5)
WBC: 4.7 10*3/uL (ref 4.0–10.5)
nRBC: 0.4 % — ABNORMAL HIGH (ref 0.0–0.2)

## 2020-04-20 LAB — PROTIME-INR
INR: 1.3 — ABNORMAL HIGH (ref 0.8–1.2)
Prothrombin Time: 15.8 seconds — ABNORMAL HIGH (ref 11.4–15.2)

## 2020-04-20 LAB — TYPE AND SCREEN
ABO/RH(D): A POS
Antibody Screen: NEGATIVE
Unit division: 0

## 2020-04-20 LAB — BPAM RBC
Blood Product Expiration Date: 202110152359
ISSUE DATE / TIME: 202109272105
Unit Type and Rh: 6200

## 2020-04-20 MED ORDER — SODIUM CHLORIDE 0.9 % IV SOLN: 10.0000 mL/h | Freq: Once | INTRAVENOUS | Status: DC

## 2020-04-20 MED ORDER — SODIUM CHLORIDE 0.9 % IV SOLN: INTRAVENOUS | Status: DC

## 2020-04-20 NOTE — Progress Notes (Signed)
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 intake and history of nausea and recurrent dark tarry stools with lower extremity swelling   Assessment & Plan:   Principal Problem:   Anemia Active Problems:   GERD   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H[facility])   Upper GI bleed   Hiatal hernia   Gastritis and gastroduodenitis   Multiple gastric ulcers   Multiple duodenal ulcers   Failure to thrive in adult   Thrombocytopenia ([place])   1. Acute blood loss anemia Probable recurrent upper GI bleed secondary to gastric duodenal ulcers a. Gi consulted but no overt planning for scope unless active bleed--has prior scope showing 4cm hiatal hernia Grade "A  esophagitis b. Have involved Palliative and oncology into these discussions c. conintue Saline 50 cc/h, ensure IV acces d. Keep CLD now--check CBC q24, if drops below 7 transfuse e. Poor overall prognosis 2. Diffuse large cell B-cell lymphoma high-grade stage IV prior on EPOCH-R a. Declined treatment in the past b. Oncology to comment on planning 3. Thrombocytopenia Related to malignancy a. symptomatic management 4. AKI on admission a. Give IVF--likely from azotemia 2/2 ABLA 5. Reflux 6. Adult failure to thrive   DVT prophylaxis: scd Code Status: DNAR Family Communication: none Disposition:   Status is: Inpatient  Remains inpatient appropriate because:Hemodynamically unstable, Persistent severe electrolyte disturbances and Altered mental status   Dispo: The patient is from:  Home              Anticipated d/c is to: unclear              Anticipated d/c date is: 2 days              Patient currently is not medically stable to d/c.   Consultants:   GI   Oncology  Procedures:   Antimicrobials:     Subjective:  Awake coherent but mild confusion No distress tells me some bleeding at home  Objective: Vitals:   04/20/20 0530 04/20/20 0600 04/20/20 0630 04/20/20 0722  BP: 125/64 129/63 (!) 114/54 (!) 126/58  Pulse: 92 92 92 91  Resp: (!) 26 (!) 23 (!) 30 (!) 28  Temp:    97.6 F (36.4 C)  TempSrc:    Oral  SpO2: 96% 95% 94% 95%    Intake/Output Summary (Last 24 hours) at 04/20/2020 0805 Last data filed at 04/20/2020 0114 Gross per 24 hour  Intake 2352.17 ml  Output --  Net 2352.17 ml   There were no vitals filed for this visit.  Examination:  General exam: wake coherent in and no focal deficit Respiratory system: clear no rales no rhonchi Cardiovascular system: rrr s1 s2 no m Gastrointestinal system: soft nt nd no rebound no gaurd. Central nervous system: neuro grossly intact but weak overall Extremities:  ROM intact no focal deficit, power 5/5 Psychiatry: slight confused  Data Reviewed: I have personally reviewed following labs and imaging studies BUNs/creatinine 39/1.2->37/1.09 Calcium 10.4 AST/ALT 43/21 Bilirubin up from 1.1-1.5 Hemoglobin 7.2 baseline about 8 White count 4 Platelets 53->42  Radiology Studies: DG Chest [facility] 1 [address]  Result Date: 04/19/2020 CLINICAL DATA:  Weakness EXAM: PORTABLE CHEST 1 VIEW COMPARISON:  03/28/2020, CT 03/29/2020 FINDINGS: Left greater than right bilateral lung nodules and pleural masses consistent with metastatic disease, some of which appears less conspicuous compared to prior radiograph. No acute airspace disease or pleural effusion. Stable cardiomediastinal silhouette. No pneumothorax. IMPRESSION: Left greater than right bilateral lung nodules and pleural masses consistent with metastatic  disease, some of which appears less conspicuous compared to prior radiograph. No definite acute airspace disease. Electronically Signed   By: Donavan Foil M.D.   On: 04/19/2020 17:41     Scheduled Meds: . amLODipine  10 mg Oral QODAY  . cholecalciferol  1,000 Units Oral Daily  . ferrous sulfate  325 mg Oral Q breakfast  . influenza vaccine adjuvanted  0.5 mL Intramuscular Tomorrow-1000  . melatonin  5 mg Oral QHS  . pantoprazole  40 mg Oral BID  . prednisoLONE acetate  1 drop Both Eyes Daily  . sucralfate  1 g Oral TID WC & HS  . [START ON 04/21/2020] vitamin B-12  1,000 mcg Oral QODAY   Continuous Infusions:   LOS: 1 day    Time spent: 20  Nita Sells, MD [facility] To contact the attending provider between 7A-7P or the covering provider during after hours 7P-7A, please log into the web site [email or web address] and access using universal [place] password for that web site. If you do not have the password, please call the hospital operator.  04/20/2020, 8:05 AM

## 2020-04-20 NOTE — ED Notes (Signed)
Patients urine sample contaminated with stool, will try again to collect.

## 2020-04-20 NOTE — ED Notes (Signed)
Pt placed on purewick at 60 mmHg. 

## 2020-04-20 NOTE — ED Notes (Signed)
Family at bedside. 

## 2020-04-20 NOTE — Consult Note (Signed)
Consultation Note Date: 04/20/2020   Patient Name: Taylor Huynh  DOB: Dec 05, 1940  MRN: [id number]  Age / Sex: 79 y.o., female  PCP: [name], MD Referring Physician: Nita Sells, MD  Reason for Consultation: Establishing goals of care  HPI/Patient Profile: 79 y.o. 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 Hospital admission for acute blood loss anemia, probable recurrent upper GI bleeding and underlying diffuse large B-cell lymphoma stage IV. Oncology consult pending. Palliative medicine consultation for goals of care.   Clinical Assessment and Goals of Care:  I have reviewed medical records, discussed with care team, and met with Ms. Demetriou at bedside to discuss goals of care. She is awake, alert, oriented and able to participate in discussion. She denies pain or discomfort. No family at bedside.   I introduced Palliative Medicine as specialized medical care for people living with serious illness. It focuses on providing relief from the symptoms and stress of a serious illness. The goal is to improve quality of life for both the patient and the family.  Patient lives at home with husband of over 45 years. She has four supportive daughters. Baseline prior to admission, increased weakness requiring walker for ambulation and assist with ADL's. Appetite poor.   Discussed patient's journey with cancer throughout the years. Discussed events leading up to admission and course of hospitalization including diagnoses, interventions, plan of care.   I attempted to elicit values and goals of care  important to the patient. She was planning to follow-up with Dr. Irene Limbo tomorrow, 9/29 at cancer center. Unfortunately she ended back in the hospital. She is hesitant to consider further chemotherapy due to previous side effects. She wishes to speak with Dr. Irene Limbo about other options and also curious in her prognosis. Reassured patient of pending oncology consult for further guidance.  Advanced directives, concepts specific to code status, artifical feeding and hydration were discussed. Patient does not have a documented POA but shares she would wish for her daughter, Angus Palms to be documented POA. We discussed her EOL wishes and she speaks of wishing to go 'naturally' and would rather be at home when the time comes. Encouraged early and ongoing discussions with her daughters and family while she is of sound mind and once more information is received from oncology.   Answered questions. Reassured patient of ongoing palliative support this admission.     SUMMARY OF RECOMMENDATIONS    Continue current plan of care and medical management.  Pending oncology consultation. Patient is hesitant to consider further chemotherapy due to previous side effects. She is interested in discussing options with Dr. Irene Limbo and also wishes to know her prognosis.   PMT will continue to follow inpatient once oncology gives recommendations.   Code Status/Advance Care Planning:  DNR  Symptom Management:  Per attending  Palliative Prophylaxis:   Aspiration, Bowel Regimen, Delirium Protocol, Frequent Pain Assessment, Oral Care and Turn Reposition  Psycho-social/Spiritual:   Desire for further Chaplaincy support: yes  Additional Recommendations: Caregiving  Support/Resources, Compassionate Wean Education and Education on Hospice  Prognosis:   Unable to determine  Discharge Planning: To Be Determined      Primary Diagnoses: Present on Admission: . Diffuse large B-cell lymphoma of lymph nodes of multiple  regions ([place]) . Gastritis and gastroduodenitis . GERD . Multiple duodenal ulcers . Multiple gastric ulcers . Upper GI bleed . Failure to thrive in adult . Thrombocytopenia ([place]) . Anemia   I have reviewed the medical record, interviewed the patient and family, and examined the patient. The following aspects are pertinent.  Past Medical History:  Diagnosis Date  . GERD (gastroesophageal reflux disease)   . HH (hiatus hernia) 12/12/2011  . Hypertension   . Leukopenia 12/12/2011  . nhl dx'd 2010  . Seizures ([place]) reports "years ago"  . Syncope 12/12/2011   Hx seizures - not documented - age 25; recent syncope 6/12 and x2, 12/12; negative MRI brain 1/13   Social History   Socioeconomic History  . Marital status: Married    Spouse name: Not on file  . Number of children: Not on file  . Years of education: Not on file  . Highest education level: Not on file  Occupational History  . Not on file  Tobacco Use  . Smoking status: Never Smoker  . Smokeless tobacco: Never Used  Vaping Use  . Vaping Use: Never used  Substance and Sexual Activity  . Alcohol use: No  . Drug use: No  . Sexual activity: Not Currently  Other Topics Concern  . Not on file  Social History Narrative  . Not on file   Social Determinants of Health   Financial Resource Strain:   . Difficulty of Paying Living Expenses: Not on file  Food Insecurity:   . Worried About Charity fundraiser in the Last Year: Not on file  . Ran Out of Food in the Last Year: Not on file  Transportation Needs:   . Lack of Transportation (Medical): Not on file  . Lack of Transportation (Non-Medical): Not on file  Physical Activity:   . Days of Exercise per Week: Not on file  . Minutes of Exercise per Session: Not on file  Stress:   . Feeling of Stress : Not on file  Social Connections:   . Frequency of Communication with Friends and Family: Not on file  . Frequency of Social Gatherings with Friends and Family: Not on file    . Attends Religious Services: Not on file  . Active Member of Clubs or Organizations: Not on file  . Attends Archivist Meetings: Not on file  . Marital Status: Not on file   Family History  Problem Relation Age of Onset  . Diabetes Mother   . Diabetes Father    Scheduled Meds: . amLODipine  10 mg Oral QODAY  . cholecalciferol  1,000 Units Oral Daily  . ferrous sulfate  325 mg Oral Q breakfast  . melatonin  5 mg Oral QHS  . pantoprazole  40 mg Oral BID  . prednisoLONE acetate  1 drop Both Eyes Daily  . sucralfate  1 g Oral TID WC & HS  . [START ON 04/21/2020] vitamin B-12  1,000 mcg Oral QODAY   Continuous Infusions: . sodium chloride    . sodium chloride  50 mL/hr at 04/20/20 1022   PRN Meds:.acetaminophen **OR** acetaminophen, fluticasone, HYDROcodone-acetaminophen, morphine injection, promethazine, senna-docusate, sorbitol Medications Prior to Admission:  Prior to Admission medications   Medication Sig Start Date End Date Taking? Authorizing Provider  cholecalciferol (VITAMIN D) 1000 UNITS tablet Take 1,000 Units by mouth daily.     Yes [provider]  famotidine (PEPCID) 20 MG tablet Take 20 mg by mouth at bedtime as needed for heartburn.  03/23/20  Yes [provider]  ferrous sulfate 325 (65 FE) MG tablet Take 1 tablet (325 mg total) by mouth daily with breakfast. 04/07/20  Yes Nita Sells, MD  fluticasone (FLONASE) 50 MCG/ACT nasal spray Place 1 spray into both nostrils daily as needed for allergies or rhinitis.  03/23/20  Yes [provider]  Melatonin 5 MG TABS Take 5 mg by mouth at bedtime.    Yes [provider]  ondansetron (ZOFRAN) 4 MG tablet Take 1 tablet (4 mg total) by mouth every 6 (six) hours as needed for nausea or vomiting. 04/13/20  Yes Cirigliano, Vito V, DO  sucralfate (CARAFATE) 1 g tablet Take 1 tablet (1 g total) by mouth 4 (four) times daily -  with meals and at bedtime for 14 days. Patient taking  differently: Take 1 g by mouth 4 (four) times daily.  03/31/20 04/19/20 Yes Nita Sells, MD  vitamin B-12 (CYANOCOBALAMIN) 1000 MCG tablet Take 1,000 mcg by mouth daily.    Yes [provider]  amLODipine (NORVASC) 10 MG tablet Take 10 mg by mouth every other day.  11/02/11   [provider]  lidocaine-prilocaine (EMLA) cream Apply 1 application topically as needed. Patient not taking: Reported on 04/19/2020 08/28/18   Brunetta Genera, MD  pantoprazole (PROTONIX) 40 MG tablet Take 1 tablet (40 mg total) by mouth 2 (two) times daily. 03/31/20   Nita Sells, MD  prednisoLONE acetate (PRED FORTE) 1 % ophthalmic suspension Place 1 drop into both eyes daily. Patient not taking: Reported on 04/19/2020 02/17/20   [provider]   Allergies  Allergen Reactions  . Strawberry (Diagnostic) Itching  . Tomato Itching   Review of Systems  Constitutional: Positive for activity change, appetite change and unexpected weight change.   Physical Exam Vitals and nursing note reviewed.  Constitutional:      General: She is awake.     Appearance: She is ill-appearing.  HENT:     Head: Normocephalic and atraumatic.  Cardiovascular:     Rate and Rhythm: Normal rate.  Pulmonary:     Effort: No tachypnea, accessory muscle usage or respiratory distress.  Skin:    General: Skin is warm and dry.  Neurological:     Mental Status: She is alert and oriented to person, place, and time.  Psychiatric:        Mood and Affect: Mood normal.        Speech: Speech normal.        Behavior: Behavior normal.        Cognition and Memory: Cognition normal.    Vital Signs: BP (!) 115/53   Pulse 91   Temp 97.6 F (36.4 C) (Oral)   Resp (!) 29   LMP  (LMP Unknown)   SpO2 94%         SpO2: SpO2: 94 % O2 Device:SpO2: 94 % O2 Flow Rate: .O2 Flow Rate (L/min): 2 L/min  IO: Intake/output summary:   Intake/Output Summary (Last 24 hours) at 04/20/2020 1111 Last data filed at  04/20/2020  0114 Gross per 24 hour  Intake 2352.17 ml  Output --  Net 2352.17 ml    LBM:   Baseline Weight:   Most recent weight:       Palliative Assessment/Data: PPS 50%     Time Total: 36mn Greater than 50%  of this time was spent counseling and coordinating care related to the above assessment and plan.  Signed by:  MIhor Dow DNP, FNP-C Palliative Medicine Team  Phone: [phone]: [phone]  Please contact Palliative Medicine Team phone at [phone] questions and concerns.  For individual provider: See AShea Evans

## 2020-04-20 NOTE — Consult Note (Addendum)
Referring Provider: Dr. Verlon Au  Primary Care Physician:  [name], MD Primary Gastroenterologist:  Dr. Ardis Hughs  Reason for Consultation: Anemia, + FOBT  HPI: Taylor Huynh is an  79 y.o. female with a past medical history of hypertension, seizure disorder, GERD, non-Hodgkin's lymphoma stage IV initially diagnosed in 2010 status post bowel section at that time which required a temporary colostomy. She initially received 4 cycles of R-CHOP ending on 09/06/09 for her High Grade B-Cell NHL. She had recurrence of  Grade B-Cell NHL stage IV for which she completed 5 cycles of EPOCH-R on 11/29/2018. She declined any further treatment at that time.   She was admitted to the hospital on 03/29/2020 with nausea, vomiting, melena and progressive generalized weakness. A chest CT angiogram identified innumerable pulmonary nodules and masses consistent with metastatic disease. An abdominal/pelvic CT identified bulky retroperitoneal and periaortic and pelvic adenopathy. Diverticulosis was noted. Admission hemoglobin 9.2. (base line Hg 11.26 May 2019).  WBC 4.8. Platelet 196. BUN 24. Creatinine 1.13. Hemoglobin level dropped to 7 and she received 1 unit of packed red blood cells. She underwent an EGD by Dr. Bryan Lemma 03/30/2020 which identified a 4 cm hiatal hernia, an esophageal ulcer without stigmata of recent bleeding, grade a reflux esophagitis, gastric ulcers without stigmata of bleeding, nonbleeding duodenal ulcers and multiple gastric polyps. Biopsies of the esophagus, stomach and duodenal ulcers  were consistent with non-Hodgkin's B-cell lymphoma. Her hemoglobin stabilized and she was discharged home 03/31/2020 on Pantoprazole 40 mg twice daily and Carafate 1 g p.o. 4 times daily for 2 weeks. She  was advised to follow-up with Dr. Irene Limbo with oncology as an outpatient.  She presented to [facility] emergency room via EMS on 04/19/2020 due to having progressive generalized weakness with  associated shortness of breath, right ankle swelling and black stools.  In the ED, her hemoglobin level was 7.1. Platelet 53. Chest x-ray identified left greater than right bilateral lung nodules and pleural masses consistent with metastatic disease. No acute process. A GI consult was requested due to anemia and + FOBT.   She reports having progressive generalized weakness since she was discharged from the hospital on 9/8.  Her appetite remained poor.  She had persistent nausea with vomiting once every few days.  No hematemesis.  She mostly vomited up water with her medications.  No heartburn or dysphagia.  No abdominal pain.  Her urine output was decreased. She remained on Pantoprazole 40mg  bid and Carafate 1 g 4 times daily.  She reported her bowel movements were brown in color for about 1 week after being discharged from the hospital.  She then started taking ferrous sulfate 325 mg once daily and her stools became black after starting p.o. iron.  She reports passing a loose black stool at least once daily.  No bright red rectal bleeding.  She lives at home with her husband.  She has several daughters who live nearby.  Her youngest daughter stayed with her over the past 5 days due to her mother's deteriorating status. She is a DNR.   EGD and colonoscopy 06/17/2008 per Dr. Ardis Hughs with EGD showing a 4 cm hiatal hernia and colonoscopy showing pandiverticulosis.   Past Medical History:  Diagnosis Date  . GERD (gastroesophageal reflux disease)   . HH (hiatus hernia) 12/12/2011  . Hypertension   . Leukopenia 12/12/2011  . nhl dx'd 2010  . Seizures ([place]) reports "years ago"  . Syncope 12/12/2011   Hx seizures - not documented -  age 7; recent syncope 6/12 and x2, 12/12; negative MRI brain 1/13    Past Surgical History:  Procedure Laterality Date  . BIOPSY  03/30/2020   Procedure: BIOPSY;  Surgeon: Lavena Bullion, DO;  Location: [facility]OSCOPY;  Service: Gastroenterology;;  . COLON SURGERY  2010    due to cancer  . ESOPHAGOGASTRODUODENOSCOPY (EGD) WITH PROPOFOL N/A 03/30/2020   Procedure: ESOPHAGOGASTRODUODENOSCOPY (EGD) WITH PROPOFOL;  Surgeon: Lavena Bullion, DO;  Location: [facility];  Service: Gastroenterology;  Laterality: N/A;  . IR IMAGING GUIDED PORT INSERTION  08/23/2018  . IR PATIENT EVAL TECH 0-60 MINS  04/22/2019  . IR PATIENT EVAL TECH 0-60 MINS  04/25/2019  . IR PATIENT EVAL TECH 0-60 MINS  04/28/2019  . IR PATIENT EVAL TECH 0-60 MINS  05/02/2019  . IR REMOVAL TUN ACCESS W/ PORT W/O FL MOD SED  04/18/2019    Prior to Admission medications   Medication Sig Start Date End Date Taking? Authorizing Provider  cholecalciferol (VITAMIN D) 1000 UNITS tablet Take 1,000 Units by mouth daily.     Yes [provider]  famotidine (PEPCID) 20 MG tablet Take 20 mg by mouth at bedtime as needed for heartburn.  03/23/20  Yes [provider]  ferrous sulfate 325 (65 FE) MG tablet Take 1 tablet (325 mg total) by mouth daily with breakfast. 04/07/20  Yes Nita Sells, MD  fluticasone (FLONASE) 50 MCG/ACT nasal spray Place 1 spray into both nostrils daily as needed for allergies or rhinitis.  03/23/20  Yes [provider]  Melatonin 5 MG TABS Take 5 mg by mouth at bedtime.    Yes [provider]  ondansetron (ZOFRAN) 4 MG tablet Take 1 tablet (4 mg total) by mouth every 6 (six) hours as needed for nausea or vomiting. 04/13/20  Yes Cirigliano, Vito V, DO  sucralfate (CARAFATE) 1 g tablet Take 1 tablet (1 g total) by mouth 4 (four) times daily -  with meals and at bedtime for 14 days. Patient taking differently: Take 1 g by mouth 4 (four) times daily.  03/31/20 04/19/20 Yes Nita Sells, MD  vitamin B-12 (CYANOCOBALAMIN) 1000 MCG tablet Take 1,000 mcg by mouth daily.    Yes [provider]  amLODipine (NORVASC) 10 MG tablet Take 10 mg by mouth every other day.  11/02/11   [provider]  lidocaine-prilocaine (EMLA) cream Apply 1  application topically as needed. Patient not taking: Reported on 04/19/2020 08/28/18   Brunetta Genera, MD  pantoprazole (PROTONIX) 40 MG tablet Take 1 tablet (40 mg total) by mouth 2 (two) times daily. 03/31/20   Nita Sells, MD  prednisoLONE acetate (PRED FORTE) 1 % ophthalmic suspension Place 1 drop into both eyes daily. Patient not taking: Reported on 04/19/2020 02/17/20   [provider]    Current Facility-Administered Medications  Medication Dose Route Frequency Provider Last Rate Last Admin  . 0.9 %  sodium chloride infusion  10 mL/hr Intravenous Once Nita Sells, MD      . 0.9 %  sodium chloride infusion   Intravenous Continuous Nita Sells, MD 50 mL/hr at 04/20/20 1022 New Bag at 04/20/20 1022  . acetaminophen (TYLENOL) tablet 650 mg  650 mg Oral Q6H PRN Swayze, Ava, DO       Or  . acetaminophen (TYLENOL) suppository 650 mg  650 mg Rectal Q6H PRN Swayze, Ava, DO      . amLODipine (NORVASC) tablet 10 mg  10 mg Oral QODAY Swayze, Ava, DO  10 mg at 04/20/20 1014  . cholecalciferol (VITAMIN D3) tablet 1,000 Units  1,000 Units Oral Daily Swayze, Ava, DO   1,000 Units at 04/20/20 1014  . ferrous sulfate tablet 325 mg  325 mg Oral Q breakfast Swayze, Ava, DO   325 mg at 04/20/20 1014  . fluticasone (FLONASE) 50 MCG/ACT nasal spray 1 spray  1 spray Each Nare Daily PRN Swayze, Ava, DO      . HYDROcodone-acetaminophen (NORCO/VICODIN) 5-325 MG per tablet 1-2 tablet  1-2 tablet Oral Q4H PRN Swayze, Ava, DO      . melatonin tablet 5 mg  5 mg Oral QHS Swayze, Ava, DO   5 mg at 04/19/20 2341  . morphine 2 MG/ML injection 2 mg  2 mg Intravenous Q2H PRN Swayze, Ava, DO      . pantoprazole (PROTONIX) EC tablet 40 mg  40 mg Oral BID Swayze, Ava, DO   40 mg at 04/20/20 1026  . prednisoLONE acetate (PRED FORTE) 1 % ophthalmic suspension 1 drop  1 drop Both Eyes Daily Swayze, Ava, DO      . promethazine (PHENERGAN) tablet 12.5 mg  12.5 mg Oral Q6H PRN Swayze, Ava, DO       . senna-docusate (Senokot-S) tablet 1 tablet  1 tablet Oral QHS PRN Swayze, Ava, DO      . sorbitol 70 % solution 30 mL  30 mL Oral Daily PRN Swayze, Ava, DO      . sucralfate (CARAFATE) tablet 1 g  1 g Oral TID WC & HS Swayze, Ava, DO   1 g at 04/20/20 1025  . [START ON 04/21/2020] vitamin B-12 (CYANOCOBALAMIN) tablet 1,000 mcg  1,000 mcg Oral QODAY Swayze, Ava, DO       Current Outpatient Medications  Medication Sig Dispense Refill  . cholecalciferol (VITAMIN D) 1000 UNITS tablet Take 1,000 Units by mouth daily.      . famotidine (PEPCID) 20 MG tablet Take 20 mg by mouth at bedtime as needed for heartburn.     . ferrous sulfate 325 (65 FE) MG tablet Take 1 tablet (325 mg total) by mouth daily with breakfast. 30 tablet 3  . fluticasone (FLONASE) 50 MCG/ACT nasal spray Place 1 spray into both nostrils daily as needed for allergies or rhinitis.     . Melatonin 5 MG TABS Take 5 mg by mouth at bedtime.     . ondansetron (ZOFRAN) 4 MG tablet Take 1 tablet (4 mg total) by mouth every 6 (six) hours as needed for nausea or vomiting. 60 tablet 3  . sucralfate (CARAFATE) 1 g tablet Take 1 tablet (1 g total) by mouth 4 (four) times daily -  with meals and at bedtime for 14 days. (Patient taking differently: Take 1 g by mouth 4 (four) times daily. ) 56 tablet 0  . vitamin B-12 (CYANOCOBALAMIN) 1000 MCG tablet Take 1,000 mcg by mouth daily.     Marland Kitchen amLODipine (NORVASC) 10 MG tablet Take 10 mg by mouth every other day.     . lidocaine-prilocaine (EMLA) cream Apply 1 application topically as needed. (Patient not taking: Reported on 04/19/2020) 30 g 1  . pantoprazole (PROTONIX) 40 MG tablet Take 1 tablet (40 mg total) by mouth 2 (two) times daily. 28 tablet 0  . prednisoLONE acetate (PRED FORTE) 1 % ophthalmic suspension Place 1 drop into both eyes daily. (Patient not taking: Reported on 04/19/2020)      Allergies as of 04/19/2020 - Review Complete 04/19/2020  Allergen Reaction Noted  .  Strawberry (diagnostic)  Itching 03/29/2020  . Tomato Itching 03/29/2020    Family History  Problem Relation Age of Onset  . Diabetes Mother   . Diabetes Father     Social History   Socioeconomic History  . Marital status: Married    Spouse name: Not on file  . Number of children: Not on file  . Years of education: Not on file  . Highest education level: Not on file  Occupational History  . Not on file  Tobacco Use  . Smoking status: Never Smoker  . Smokeless tobacco: Never Used  Vaping Use  . Vaping Use: Never used  Substance and Sexual Activity  . Alcohol use: No  . Drug use: No  . Sexual activity: Not Currently  Other Topics Concern  . Not on file  Social History Narrative  . Not on file   Social Determinants of Health   Financial Resource Strain:   . Difficulty of Paying Living Expenses: Not on file  Food Insecurity:   . Worried About Charity fundraiser in the Last Year: Not on file  . Ran Out of Food in the Last Year: Not on file  Transportation Needs:   . Lack of Transportation (Medical): Not on file  . Lack of Transportation (Non-Medical): Not on file  Physical Activity:   . Days of Exercise per Week: Not on file  . Minutes of Exercise per Session: Not on file  Stress:   . Feeling of Stress : Not on file  Social Connections:   . Frequency of Communication with Friends and Family: Not on file  . Frequency of Social Gatherings with Friends and Family: Not on file  . Attends Religious Services: Not on file  . Active Member of Clubs or Organizations: Not on file  . Attends Archivist Meetings: Not on file  . Marital Status: Not on file  Intimate Partner Violence:   . Fear of Current or Ex-Partner: Not on file  . Emotionally Abused: Not on file  . Physically Abused: Not on file  . Sexually Abused: Not on file    Review of Systems: Gen: No fevers. + weight loss.  CV: Denies chest pain, palpitations or edema. Resp: Denies cough, shortness of breath of hemoptysis.    GI: Se HPI.   GU : Decreased urinary output.  MS: Denies joint pain, muscles aches or weakness. Derm: Denies rash, itchiness, skin lesions or unhealing ulcers. Psych: Denies depression or anxiety. No memory loss.  Heme: Denies easy bruising, bleeding. Neuro:  Denies headaches, dizziness or paresthesias. Endo:  Denies any problems with DM, thyroid or adrenal function.  Physical Exam: Vital signs in last 24 hours: Temp:  [97.6 F (36.4 C)-99.9 F (37.7 C)] 97.6 F (36.4 C) (09/28 0722) Pulse Rate:  [90-110] 91 (09/28 0959) Resp:  [15-34] 29 (09/28 0959) BP: (99-153)/(48-92) 115/53 (09/28 1014) SpO2:  [91 %-100 %] 94 % (09/28 0959)   General:  Alert fatigued appearing 79 year old female in no acute distress. Head:  Normocephalic and atraumatic. Eyes:  No scleral icterus. Conjunctiva pink. Ears:  Normal auditory acuity. Nose:  No deformity, discharge or lesions. Mouth: Absent dentition.  No ulcers or lesions.  Neck:  Supple. No lymphadenopathy or thyromegaly.  Lungs: Breath sounds clear throughout. Heart: Rate and rhythm, no murmurs. Abdomen: Soft, nontender, no masses or organomegaly.  Lower abdominal midline scar intact. Rectal: Deferred. Musculoskeletal:  RLE > LLE. Pulses:  Normal pulses noted. Extremities: Right lower extremity with 2+ edema  from the ankle to thigh. Neurologic:  Alert and  oriented x4. No focal deficits.  Skin:  Intact without significant lesions or rashes. Psych:  Alert and cooperative. Normal mood and affect.  Intake/Output from previous day: 09/27 0701 - 09/28 0700 In: 2352.2 [I.V.:37.2; Blood:315; IV Piggyback:2000] Out: -  Intake/Output this shift: No intake/output data recorded.  Lab Results: Recent Labs    04/19/20 1738 04/20/20 0345  WBC 4.6 4.7  HGB 7.1* 7.2*  HCT 22.8* 22.7*  PLT 53* 42*   BMET Recent Labs    04/19/20 1738 04/20/20 0345  NA 141 141  K 4.3 3.9  CL 103 107  CO2 25 22  GLUCOSE 81 84  BUN 39* 37*  CREATININE  1.21* 1.09*  CALCIUM 11.2* 10.4*   LFT Recent Labs    04/20/20 0345  PROT 4.2*  ALBUMIN 2.1*  AST 43*  ALT 21  ALKPHOS 33*  BILITOT 1.5*   PT/INR Recent Labs    04/19/20 1738 04/20/20 0345  LABPROT 14.7 15.8*  INR 1.2 1.3*   Hepatitis Panel No results for input(s): HEPBSAG, HCVAB, HEPAIGM, HEPBIGM in the last 72 hours.    Studies/Results: DG Chest Port 1 View  Result Date: 04/19/2020 CLINICAL DATA:  Weakness EXAM: PORTABLE CHEST 1 VIEW COMPARISON:  03/28/2020, CT 03/29/2020 FINDINGS: Left greater than right bilateral lung nodules and pleural masses consistent with metastatic disease, some of which appears less conspicuous compared to prior radiograph. No acute airspace disease or pleural effusion. Stable cardiomediastinal silhouette. No pneumothorax. IMPRESSION: Left greater than right bilateral lung nodules and pleural masses consistent with metastatic disease, some of which appears less conspicuous compared to prior radiograph. No definite acute airspace disease. Electronically Signed   By: Donavan Foil M.D.   On: 04/19/2020 17:41    IMPRESSION/PLAN:   51. 79 year old female with non-Hodgkin's lymphoma stage IV recently admitted to the hospital 9/6 - 03/31/2020 with progressive weakness, UGI bleed/melena.  EGD 9/6 showed esophageal, gastric and duodenal ulcers. Pathology consistent with  non-Hodgkin's B-cell lymphoma. She presented to [facility] ED  04/19/2020 with profound weakness, SOB and RLE swelling. Hg 7.1. (Hg 8.0 at time of discharge 9/8).  -Monitor H/H closely -Transfuse for Hg < 7.0. -No plans for endoscopic evaluation unless patient demonstrates severe active GI bleeding, esophageal, gastric and duodenal ulcers pathology c/w non-Hodgkin's lymphoma -Await recommendations from oncology -Pantoprazole 40mg  IV  Bid -Zofran 4mg  IV Q 6 hrs PRN  2. Thrombocytopenia, secondary to # 1  3. T. Bili elevated. Normal LFTs. Most likely due to hemolysis.  -Hepatic panel in am, check  direct/indirect bili levels   4. AKI, improving   5. RLE edema -Recommend doppler RLE, defer to the hospitalist   6. SOB. Pulmonary metastasis   Our GI service will sign off at this time    Noralyn Pick  04/20/2020, 11:35 AM   ________________________________________________________________________  Velora Heckler GI MD note:  I personally examined the patient, reviewed the data and agree with the assessment and plan described above.  She has lymphoma throughout her abdomen with bulky adenopathy and even causing ulcers in her esophagus, stomach and duodenum (all + for NHL on EGD 3 weeks ago).  There are no endoscopic options to help treat this cancer.  PPI BID and carafate TID may or may not help palliate her symptoms but certainly reasonable to continue those meds.  Also seems reasonable to transfuse blood as needed however since she is declining chemotherapy the lymphoma sites will continue to grow and  will likely cause more bleeding or other symptoms  Please call or page with any further questions or concerns.    Owens Loffler, MD [facility] Pager [phone]

## 2020-04-20 NOTE — Progress Notes (Addendum)
HEMATOLOGY-ONCOLOGY PROGRESS NOTE  SUBJECTIVE: Taylor Huynh presented to the emergency room with generalized weakness, anorexia, intermittent emesis, falls, and swelling in her right lower extremity.  On admission, her hemoglobin was noted to be 7.1 and platelet count was 53,000.  Stool for occult blood positive.  She has received 1 unit of PRBCs so far this admission.  Platelet count this morning down further to 42,000.  The patient has not been seen in our office since October 2020.  She did not follow-up because she did not want any additional treatment for her lymphoma.  Due to not feeling well, she had scheduled follow-up with [place] for tomorrow.   The patient was hospitalized earlier this month due to upper GI bleeding.  She underwent upper endoscopy during that hospitalization which showed esophageal ulcers, esophagitis, and duodenal ulcers.  Biopsies were taken during that EGD which showed involvement of her non-Hodgkin's lymphoma.  Was advised to follow-up with oncology when she was discharged, but opted not to.  The patient's daughter is at the bedside at time of visit.  She reports generalized weakness, anorexia, weight loss, nausea, vomiting.  She also notices that her legs are more swollen, right greater than left.  States that stools are dark in color.  She has some shortness of breath with exertion but does not report any chest pain today.  Oncology History  Diffuse large B cell lymphoma ([place])  08/29/2018 Initial Diagnosis   Diffuse large B cell lymphoma ([place])   09/02/2018 -  Chemotherapy   The patient had DOXOrubicin (ADRIAMYCIN) 12 mg, etoposide (VEPESID) 62 mg, vinCRIStine (ONCOVIN) 0.8 mg in sodium chloride 0.9 % 500 mL chemo infusion, , Intravenous, Once, 5 of 5 cycles Administration:  (09/02/2018),  (09/03/2018),  (09/23/2018),  (09/24/2018),  (09/04/2018),  (09/05/2018),  (09/25/2018),  (09/26/2018),  (10/14/2018),  (10/15/2018),  (10/16/2018),  (10/17/2018),  (11/05/2018),  (11/04/2018),  (11/06/2018),   (11/07/2018),  (11/25/2018),  (11/26/2018),  (11/27/2018),  (11/28/2018) ondansetron (ZOFRAN) 8 mg, dexamethasone (DECADRON) 10 mg in sodium chloride 0.9 % 50 mL IVPB, , Intravenous,  Once, 5 of 5 cycles Administration: 18 mg (09/02/2018), 18 mg (09/03/2018), 8 mg (09/06/2018), 18 mg (09/23/2018), 18 mg (09/24/2018), 36 mg (09/27/2018), 18 mg (09/04/2018), 18 mg (09/05/2018), 18 mg (09/25/2018), 8 mg (09/26/2018), 18 mg (10/14/2018), 18 mg (10/15/2018), 36 mg (10/18/2018), 18 mg (10/16/2018), 18 mg (10/17/2018), 18 mg (11/05/2018), 16 mg (11/08/2018), 8 mg (11/04/2018), 18 mg (11/06/2018), 8 mg (11/07/2018), 8 mg (11/25/2018), 18 mg (11/26/2018), 18 mg (11/27/2018), 8 mg (11/28/2018) cyclophosphamide (CYTOXAN) 780 mg in sodium chloride 0.9 % 250 mL chemo infusion, 400 mg/m2 = 780 mg (53.3 % of original dose 750 mg/m2), Intravenous,  Once, 5 of 5 cycles Dose modification: 400 mg/m2 (original dose 750 mg/m2, Cycle 1, Reason: Provider Judgment) Administration: 780 mg (09/06/2018), 780 mg (09/27/2018), 780 mg (10/18/2018), 780 mg (11/08/2018)  for chemotherapy treatment.    09/09/2018 -  Chemotherapy   The patient had pegfilgrastim (NEULASTA) injection 6 mg, 6 mg, Subcutaneous, Once, 5 of 6 cycles Administration: 6 mg (09/09/2018), 6 mg (09/30/2018), 6 mg (10/21/2018), 6 mg (11/11/2018), 6 mg (12/02/2018) riTUXimab (RITUXAN) 700 mg in sodium chloride 0.9 % 250 mL (2.1875 mg/mL) infusion, 375 mg/m2 = 700 mg, Intravenous,  Once, 1 of 1 cycle Administration: 700 mg (09/09/2018) riTUXimab (RITUXAN) 700 mg in sodium chloride 0.9 % 180 mL infusion, 375 mg/m2 = 700 mg, Intravenous,  Once, 4 of 5 cycles Administration: 700 mg (09/30/2018), 700 mg (10/21/2018), 700 mg (11/11/2018), 700 mg (  12/02/2018)  for chemotherapy treatment.    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[place])   09/02/2018 -  Chemotherapy   The patient had DOXOrubicin  (ADRIAMYCIN) 12 mg, etoposide (VEPESID) 62 mg, vinCRIStine (ONCOVIN) 0.8 mg in sodium chloride 0.9 % 500 mL chemo infusion, , Intravenous, Once, 5 of 5 cycles Administration:  (09/02/2018),  (09/03/2018),  (09/23/2018),  (09/24/2018),  (09/04/2018),  (09/05/2018),  (09/25/2018),  (09/26/2018),  (10/14/2018),  (10/15/2018),  (10/16/2018),  (10/17/2018),  (11/05/2018),  (11/04/2018),  (11/06/2018),  (11/07/2018),  (11/25/2018),  (11/26/2018),  (11/27/2018),  (11/28/2018) ondansetron (ZOFRAN) 8 mg, dexamethasone (DECADRON) 10 mg in sodium chloride 0.9 % 50 mL IVPB, , Intravenous,  Once, 5 of 5 cycles Administration: 18 mg (09/02/2018), 18 mg (09/03/2018), 8 mg (09/06/2018), 18 mg (09/23/2018), 18 mg (09/24/2018), 36 mg (09/27/2018), 18 mg (09/04/2018), 18 mg (09/05/2018), 18 mg (09/25/2018), 8 mg (09/26/2018), 18 mg (10/14/2018), 18 mg (10/15/2018), 36 mg (10/18/2018), 18 mg (10/16/2018), 18 mg (10/17/2018), 18 mg (11/05/2018), 16 mg (11/08/2018), 8 mg (11/04/2018), 18 mg (11/06/2018), 8 mg (11/07/2018), 8 mg (11/25/2018), 18 mg (11/26/2018), 18 mg (11/27/2018), 8 mg (11/28/2018) cyclophosphamide (CYTOXAN) 780 mg in sodium chloride 0.9 % 250 mL chemo infusion, 400 mg/m2 = 780 mg (53.3 % of original dose 750 mg/m2), Intravenous,  Once, 5 of 5 cycles Dose modification: 400 mg/m2 (original dose 750 mg/m2, Cycle 1, Reason: Provider Judgment) Administration: 780 mg (09/06/2018), 780 mg (09/27/2018), 780 mg (10/18/2018), 780 mg (11/08/2018)  for chemotherapy treatment.    09/09/2018 -  Chemotherapy   The patient had pegfilgrastim (NEULASTA) injection 6 mg, 6 mg, Subcutaneous, Once, 5 of 6 cycles Administration: 6 mg (09/09/2018), 6 mg (09/30/2018), 6 mg (10/21/2018), 6 mg (11/11/2018), 6 mg (12/02/2018) riTUXimab (RITUXAN) 700 mg in sodium chloride 0.9 % 250 mL (2.1875 mg/mL) infusion, 375 mg/m2 = 700 mg, Intravenous,  Once, 1 of 1 cycle Administration: 700 mg (09/09/2018) riTUXimab (RITUXAN) 700 mg in sodium chloride 0.9 % 180 mL infusion, 375 mg/m2 = 700 mg, Intravenous,  Once, 4  of 5 cycles Administration: 700 mg (09/30/2018), 700 mg (10/21/2018), 700 mg (11/11/2018), 700 mg (12/02/2018)  for chemotherapy treatment.       REVIEW OF SYSTEMS:   Constitutional: Denies fevers, chills.  Reports generalized weakness and poor appetite Eyes: Denies blurriness of vision Ears, nose, mouth, throat, and face: Denies mucositis or sore throat Respiratory: Reports shortness of breath with exertion Cardiovascular: Denies palpitation, chest discomfort Gastrointestinal: Reports nausea and vomiting.  Stools are dark in color. Skin: Denies abnormal skin rashes Neurological:Denies numbness, tingling or new weaknesses Behavioral/Psych: Mood is stable, no new changes  Extremities: She has no extremity edema, right greater than left All other systems were reviewed with the patient and are negative.  I have reviewed the past medical history, past surgical history, social history and family history with the patient and they are unchanged from previous note.   PHYSICAL EXAMINATION: ECOG PERFORMANCE STATUS: 3 - Symptomatic, >50% confined to bed  Vitals:   04/20/20 0959 04/20/20 1014  BP: (!) 113/59 (!) 115/53  Pulse: 91   Resp: (!) 29   Temp:    SpO2: 94%    There were no vitals filed for this visit.  Intake/Output from previous day: 09/27 0701 - 09/28 0700 In: 2352.2 [I.V.:37.2; Blood:315; IV Piggyback:2000] Out: -   GENERAL: Chronically ill-appearing female, no distress SKIN: skin color,  texture, turgor are normal, no rashes or significant lesions EYES: normal, Conjunctiva are pink and non-injected, sclera clear OROPHARYNX:no exudate, no erythema and lips, buccal mucosa, and tongue normal  LYMPH: She has palpable right inguinal lymph nodes LUNGS: clear to auscultation and percussion with normal breathing effort HEART: regular rate & rhythm and no murmurs, pitting edema in the bilateral lower extremities, right greater than left ABDOMEN: Positive bowel sounds, soft,  tenderness with palpation NEURO: alert & oriented x 3 with fluent speech, no focal motor/sensory deficits  LABORATORY DATA:  I have reviewed the data as listed CMP Latest Ref Rng & Units 04/20/2020 04/19/2020 03/31/2020  Glucose 70 - 99 mg/dL 84 81 89  BUN 8 - 23 mg/dL 37(H) 39(H) 15  Creatinine 0.44 - 1.00 mg/dL 1.09(H) 1.21(H) 0.88  Sodium 135 - 145 mmol/L 141 141 139  Potassium 3.5 - 5.1 mmol/L 3.9 4.3 3.6  Chloride 98 - 111 mmol/L 107 103 110  CO2 22 - 32 mmol/L 22 25 18(L)  Calcium 8.9 - 10.3 mg/dL 10.4(H) 11.2(H) 8.5(L)  Total Protein 6.5 - 8.1 g/dL 4.2(L) 4.7(L) -  Total Bilirubin 0.3 - 1.2 mg/dL 1.5(H) 1.1 -  Alkaline Phos 38 - 126 U/L 33(L) 38 -  AST 15 - 41 U/L 43(H) 47(H) -  ALT 0 - 44 U/L 21 23 -    Lab Results  Component Value Date   WBC 4.7 04/20/2020   HGB 7.2 (L) 04/20/2020   HCT 22.7 (L) 04/20/2020   MCV 91.9 04/20/2020   PLT 42 (L) 04/20/2020   NEUTROABS 3.2 04/19/2020    DG Chest 2 View  Result Date: 03/28/2020 CLINICAL DATA:  Shortness of breath, weakness and lethargy. Non-Hodgkin's lymphoma. EXAM: CHEST - 2 VIEW COMPARISON:  PET-CT dated 05/14/2019. Chest radiographs dated 06/11/2016. FINDINGS: Interval multiple rounded and oval masses in both lungs, greater on the left. Some of these are pleural based and others appear to be within the lung parenchyma. Otherwise, the lungs are clear. Normal sized heart. Thoracic spine degenerative changes. IMPRESSION: Interval multiple bilateral lung masses, greater on the left, most compatible with metastatic disease and most likely related to the patient's non-Hodgkin's lymphoma. Electronically Signed   By: Claudie Revering M.D.   On: 03/28/2020 14:54   CT Angio Chest PE W and/or Wo Contrast  Result Date: 03/29/2020 CLINICAL DATA:  B-cell lymphoma. Anorexia, vomiting. Bowel obstruction suspected. EXAM: CT ANGIOGRAPHY CHEST WITH CONTRAST TECHNIQUE: Multidetector CT imaging of the chest was performed using the standard protocol during  bolus administration of intravenous contrast. Multiplanar CT image reconstructions and MIPs were obtained to evaluate the vascular anatomy. CONTRAST:  146m OMNIPAQUE IOHEXOL 350 MG/ML SOLN COMPARISON:  Chest CT 02/10/2010.  PET CT 08/22/2018 FINDINGS: Cardiovascular: Heart is normal size. Aorta normal caliber with scattered calcifications. Mediastinum/Nodes: Borderline right paratracheal lymph node with a short axis diameter of 9 mm. No axillary adenopathy. Borderline left hilar lymph nodes with a short axis diameter of 11 mm. Lungs/Pleura: Extensive bilateral pulmonary nodules and masses, most peripherally. Index left lower lobe mass measures 4 cm on image 78. Index right lower lobe mass measures 5 cm on image 87. no effusions. Upper Abdomen: Imaging into the upper abdomen demonstrates no acute findings. Musculoskeletal: Chest wall soft tissues are unremarkable. No acute bony abnormality. Review of the MIP images confirms the above findings. IMPRESSION: Innumerable bilateral pulmonary nodules and masses most compatible with metastatic disease. Borderline sized right paratracheal lymph nodes and left hilar lymph nodes. Aortic Atherosclerosis (ICD10-I70.0). Electronically Signed  By: Rolm Baptise M.D.   On: 03/29/2020 03:04   CT ABDOMEN PELVIS W CONTRAST  Result Date: 03/29/2020 CLINICAL DATA:  B-cell lymphoma. Bowel obstruction suspected. Anorexia, vomiting EXAM: CT ABDOMEN AND PELVIS WITH CONTRAST TECHNIQUE: Multidetector CT imaging of the abdomen and pelvis was performed using the standard protocol following bolus administration of intravenous contrast. CONTRAST:  166m OMNIPAQUE IOHEXOL 350 MG/ML SOLN COMPARISON:  07/18/2018 FINDINGS: Lower chest: Innumerable bilateral pulmonary nodules and masses seen as seen on chest CT today. Heart is normal size. Hepatobiliary: No focal liver abnormality is seen. Status post cholecystectomy. No biliary dilatation. Pancreas: No focal abnormality or ductal dilatation.  Spleen: No focal abnormality.  Normal size. Adrenals/Urinary Tract: Bilateral renal cysts. No adrenal mass or hydronephrosis. Urinary bladder decompressed, grossly unremarkable. Stomach/Bowel: Colonic diverticulosis. No active diverticulitis. No evidence of bowel obstruction. Stomach and small bowel decompressed, grossly unremarkable. Vascular/Lymphatic: Aortic atherosclerosis. No aneurysm. Bulky retroperitoneal adenopathy. Conglomerate nodal mass in the left periaortic region measures up to 6.4 cm. Numerous other retroperitoneal periaortic lymph nodes as well as pelvic sidewall/iliac chain lymph nodes bilaterally right pelvic sidewall lymph node on image 69 as a diameter of 4.7 cm. Right inguinal adenopathy. Short axis diameter measures 2.1 cm. Reproductive: Prior hysterectomy.  No adnexal masses. Other: No free fluid or free air. Musculoskeletal: No acute bony abnormality. IMPRESSION: Innumerable bilateral pulmonary nodules and masses in the visualized lung bases. See chest CT report. Bulky retroperitoneal/periaortic and pelvic sidewall adenopathy. Right inguinal adenopathy. Colonic diverticulosis. Aortic atherosclerosis. Electronically Signed   By: KRolm BaptiseM.D.   On: 03/29/2020 03:07   DG Chest Port 1 View  Result Date: 04/19/2020 CLINICAL DATA:  Weakness EXAM: PORTABLE CHEST 1 VIEW COMPARISON:  03/28/2020, CT 03/29/2020 FINDINGS: Left greater than right bilateral lung nodules and pleural masses consistent with metastatic disease, some of which appears less conspicuous compared to prior radiograph. No acute airspace disease or pleural effusion. Stable cardiomediastinal silhouette. No pneumothorax. IMPRESSION: Left greater than right bilateral lung nodules and pleural masses consistent with metastatic disease, some of which appears less conspicuous compared to prior radiograph. No definite acute airspace disease. Electronically Signed   By: KDonavan FoilM.D.   On: 04/19/2020 17:41   VAS [place]OWER  EXTREMITY VENOUS (DVT)  Result Date: 03/31/2020  Lower Venous DVTStudy Indications: Swelling.  Risk Factors: Cancer. Limitations: Poor ultrasound/tissue interface. Comparison Study: No prior studies. Performing Technologist: GOliver HumRVT  Examination Guidelines: A complete evaluation includes B-mode imaging, spectral Doppler, color Doppler, and power Doppler as needed of all accessible portions of each vessel. Bilateral testing is considered an integral part of a complete examination. Limited examinations for reoccurring indications may be performed as noted. The reflux portion of the exam is performed with the patient in reverse Trendelenburg.  +---------+---------------+---------+-----------+----------+--------------+ RIGHT    CompressibilityPhasicitySpontaneityPropertiesThrombus Aging +---------+---------------+---------+-----------+----------+--------------+ CFV      Full           Yes      Yes                                 +---------+---------------+---------+-----------+----------+--------------+ SFJ      Full                                                        +---------+---------------+---------+-----------+----------+--------------+  FV Prox  Full                                                        +---------+---------------+---------+-----------+----------+--------------+ FV Mid   Full           Yes      Yes                                 +---------+---------------+---------+-----------+----------+--------------+ FV Distal               Yes      Yes                                 +---------+---------------+---------+-----------+----------+--------------+ PFV      Full                                                        +---------+---------------+---------+-----------+----------+--------------+ POP      Full           Yes      Yes                                  +---------+---------------+---------+-----------+----------+--------------+ PTV      Full                                                        +---------+---------------+---------+-----------+----------+--------------+ PERO     Full                                                        +---------+---------------+---------+-----------+----------+--------------+   +----+---------------+---------+-----------+----------+--------------+ LEFTCompressibilityPhasicitySpontaneityPropertiesThrombus Aging +----+---------------+---------+-----------+----------+--------------+ CFV Full           Yes      Yes                                 +----+---------------+---------+-----------+----------+--------------+     Summary: RIGHT: - There is no evidence of deep vein thrombosis in the lower extremity. However, portions of this examination were limited- see technologist comments above.  - No cystic structure found in the popliteal fossa. - Ultrasound characteristics of enlarged lymph nodes are noted in the groin.  LEFT: - No evidence of common femoral vein obstruction.  *See table(s) above for measurements and observations. Electronically signed by Monica Martinez MD on 03/31/2020 at 1:50:41 PM.    Final     ASSESSMENT AND PLAN: 79 y.o. female with  1. 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 2. Tiny hiatal hernia. 3. Pelvic floor laxity. Aortic Atherosclerosis.  Labs upon initial presentation from1/14/20, WBC normal at 4.3k, HGB at 10.6, PLT normal at 201k  08/06/18 Right Inguinal LN Biopsy which revealed Large B-Cell Lymphoma with a Ki67 of 90%. FISH is negative for rearrangements of BCL2, BCL6, and MYC. 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  08/13/18 Hep B and Hep C negative  08/21/18 ECHO which revealed LV EF of 50-55%  08/22/18 PET/CT revealedProgression, since 97/08/6376, of hypermetabolic abdominopelvic adenopathy, consistent with active lymphoma. 2. CT occult hypermetabolic osseous foci, also most consistent with active lymphoma. 3. No evidence of soft tissue disease above the diaphragm. 4. Focus of hypermetabolism within the anus is likely physiologic. Consider physical exam correlation. 5. Aortic Atherosclerosis.  10/24/18 PET/CT revealed Marked response to therapy of abdominopelvic lymphoma. Residual right pelvic hypermetabolic nodes. (Deauville 5). 2. No new sites of disease. 3. Marrow hypermetabolism is likely due to stimulation by chemotherapy. 4. Coronary artery atherosclerosis. Aortic Atherosclerosis.   S/p 5 cycles of EPOCH-R completed on 11/29/18, dose reduced for age and previous chemotherapy exposure. Discussed considerations of C6 EPOCH-R and the indications to do so. Pt strongly prefers to cease treatment now, after completing 5 cycles of EPOCH-R.   01/14/19 PET/CT revealed "Two progressive right inguinal nodes, as above (Deauville criteria 5). Otherwise, no suspicious lymphadenopathy in the neck, chest, abdomen, or pelvis. Spleen is normal in size."  3. RLE swelling r/o DVT. Could be from lymphedema related to her new/recurrent NHL 1/21/10US Venous RLE to rule out DVT - reviewed - no VTE  4. 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  Has heme positive stools  5.  AKI  PLAN:  -Labs from today have been  reviewed.  She has persistent anemia with a hemoglobin of 7.2 and platelets have dropped further down to 42,000. -She has heme positive stools with known involvement of her GI tract of her non-Hodgkin's lymphoma. -She has been seen by GI with no plans for endoscopic evaluation unless she develops severe active bleeding.  They recommend PRBC transfusion for hemoglobin less than 7. -Thrombocytopenia likely due to underlying lymphoma as well as ongoing bleeding.  Transfuse for platelet count of less than 20,000. -Recommend checking ferritin and iron studies with next lab draw.  We can consider her for IV iron if she is iron deficient. -As previously discussed in our office, treatment for her lymphoma is up to her.  She has had a fast-growing high-grade lymphoma that was progressing even almost a year ago. -Today, the patient has expressed that she would not be interested in systemic chemotherapy unless it would "make her feel better."  I am concerned that her performance status has declined to the point that additional chemotherapy would not be beneficial to her.  Further discussion later today per Dr. Irene Limbo. -Palliative care has been consulted and appreciate their assistance with ongoing discussion regarding goals of care.   LOS: 1 day   Mikey Bussing, DNP, AGPCNP-BC, AOCNP 04/20/20   ADDENDUM   .Patient was Personally and independently interviewed, examined and relevant elements of the history  of present illness were reviewed in details and an assessment and plan was created. All elements of the patient's history of present illness , assessment and plan were discussed in details with Mikey Bussing, DNP, AGPCNP-BC, AOCNP. The above documentation reflects our combined findings assessment and plan.   I had a detailed discussion with Ms. Acquanetta Chain and her daughter at bedside with regards to her current clinical scenario and goals of care.  We discussed that her lymphoma has come roaring back  and has relapsed for a second time.  We discussed potential treatment approaches including considerations of some of the newer treatment approaches including their use of targeted antibody conjugate's such as polatuzumab vedotin as well as Tafasitamab+ lenalidomide among others. She is not keen to pursue any life prolonging treatments at this time and does not feel she can handle the burden of treatments and close follow-up that these would necessitate. She notes that she is very clear that she would like to be kept comfortable and both the patient and her daughter would like to pursue best supportive cares through hospice. We appreciate the help from the hospitalist and other nursing staff. Please call if any other additional oncologic questions. Patient would be appropriate for best supportive cares based on her goals of care discussion and choices. She is aware that she has extensive lymphoma causing anemia and thrombocytopenia as well as gastrointestinal ulcers related to her lymphoma that are actively bleeding.  Could consider dexamethasone 4 mg twice daily for fatigue and to reduce abdominal discomfort though these could potentially also worsen her stomach ulcers.   Sullivan Lone MD MS

## 2020-04-21 ENCOUNTER — Other Ambulatory Visit: Payer: Medicare Other

## 2020-04-21 ENCOUNTER — Ambulatory Visit: Payer: Medicare Other | Admitting: Hematology

## 2020-04-21 LAB — URINE CULTURE

## 2020-04-21 LAB — COMPREHENSIVE METABOLIC PANEL
ALT: 22 U/L (ref 0–44)
AST: 47 U/L — ABNORMAL HIGH (ref 15–41)
Albumin: 2 g/dL — ABNORMAL LOW (ref 3.5–5.0)
Alkaline Phosphatase: 35 U/L — ABNORMAL LOW (ref 38–126)
Anion gap: 9 (ref 5–15)
BUN: 33 mg/dL — ABNORMAL HIGH (ref 8–23)
CO2: 23 mmol/L (ref 22–32)
Calcium: 10 mg/dL (ref 8.9–10.3)
Chloride: 109 mmol/L (ref 98–111)
Creatinine, Ser: 0.98 mg/dL (ref 0.44–1.00)
GFR calc Af Amer: >60 mL/min (ref >60)
GFR calc non Af Amer: 54 mL/min — ABNORMAL LOW (ref >60)
Glucose, Bld: 84 mg/dL (ref 70–99)
Potassium: 3.8 mmol/L (ref 3.5–5.1)
Sodium: 141 mmol/L (ref 135–145)
Total Bilirubin: 1.1 mg/dL (ref 0.3–1.2)
Total Protein: 4.2 g/dL — ABNORMAL LOW (ref 6.5–8.1)

## 2020-04-21 LAB — CBC WITH DIFFERENTIAL/PLATELET
Abs Immature Granulocytes: 0.07 10*3/uL (ref 0.00–0.07)
Basophils Absolute: 0 10*3/uL (ref 0.0–0.1)
Basophils Relative: 0 %
Eosinophils Absolute: 0.1 10*3/uL (ref 0.0–0.5)
Eosinophils Relative: 1 %
HCT: 24.6 % — ABNORMAL LOW (ref 36.0–46.0)
Hemoglobin: 7.3 g/dL — ABNORMAL LOW (ref 12.0–15.0)
Immature Granulocytes: 2 %
Lymphocytes Relative: 12 %
Lymphs Abs: 0.5 10*3/uL — ABNORMAL LOW (ref 0.7–4.0)
MCH: 28 pg (ref 26.0–34.0)
MCHC: 29.7 g/dL — ABNORMAL LOW (ref 30.0–36.0)
MCV: 94.3 fL (ref 80.0–100.0)
Monocytes Absolute: 0.6 10*3/uL (ref 0.1–1.0)
Monocytes Relative: 14 %
Neutro Abs: 2.9 10*3/uL (ref 1.7–7.7)
Neutrophils Relative %: 71 %
Platelets: 40 10*3/uL — ABNORMAL LOW (ref 150–400)
RBC: 2.61 MIL/uL — ABNORMAL LOW (ref 3.87–5.11)
RDW: 19 % — ABNORMAL HIGH (ref 11.5–15.5)
WBC: 4.1 10*3/uL (ref 4.0–10.5)
nRBC: 0 % (ref 0.0–0.2)

## 2020-04-21 LAB — IRON AND TIBC
Iron: 40 ug/dL (ref 28–170)
Saturation Ratios: 25 % (ref 10.4–31.8)
TIBC: 162 ug/dL — ABNORMAL LOW (ref 250–450)
UIBC: 122 ug/dL

## 2020-04-21 LAB — FERRITIN: Ferritin: 1598 ng/mL — ABNORMAL HIGH (ref 11–307)

## 2020-04-21 MED ORDER — SUCRALFATE 1 G PO TABS: 1.0000 g | ORAL_TABLET | Freq: Three times a day (TID) | ORAL | 1 refills | Status: AC

## 2020-04-21 MED ORDER — HYDROCODONE-ACETAMINOPHEN 5-325 MG PO TABS: 1.0000 | ORAL_TABLET | ORAL | 0 refills | Status: AC | PRN

## 2020-04-21 MED ORDER — PROMETHAZINE HCL 12.5 MG PO TABS: 12.5000 mg | ORAL_TABLET | Freq: Four times a day (QID) | ORAL | 0 refills | Status: AC | PRN

## 2020-04-21 MED ORDER — PROSOURCE PLUS PO LIQD: 30.0000 mL | Freq: Two times a day (BID) | ORAL | Status: AC

## 2020-04-21 MED ORDER — PROSOURCE PLUS PO LIQD
30.0000 mL | Freq: Two times a day (BID) | ORAL | Status: DC
  Administered 2020-04-21: 30 mL via ORAL

## 2020-04-21 MED ORDER — ADULT MULTIVITAMIN W/MINERALS CH: 1.0000 | ORAL_TABLET | Freq: Every day | ORAL | Status: DC

## 2020-04-21 MED ORDER — BOOST / RESOURCE BREEZE PO LIQD CUSTOM
1.0000 | Freq: Three times a day (TID) | ORAL | Status: DC
  Administered 2020-04-21: 1 via ORAL

## 2020-04-21 NOTE — Progress Notes (Signed)
Daily Progress Note   Patient Name: Taylor Huynh       Date: 04/21/2020 DOB: February 01, 1941  Age: 79 y.o. MRN#: [id number] Attending Physician: Eugenie Filler, MD Primary Care Physician: [name], MD Admit Date: 04/19/2020  Reason for Consultation/Follow-up: Establishing goals of care  Subjective: Awake, alert, oriented. No complaints.   GOC:  Spoke with daughter, Trulus via telephone to discuss goals of care.  Trulus confirms that her and her mother spoke with Dr. Irene Limbo yesterday afternoon. Trulus confirms her mother's decision against further oncology workup or treatment. Trulus shares her mother's thoughts that she is "65 years old and I don't want to go through it." Trulus shares that the decision was made for discharge home with hospice services, understanding poor long-term prognosis with progressive cancer, GI bleeding and anemia.   Discussed hospice options and philosophy, emphasizing focus on comfort, symptom management, quality of life, allowing nature to take course, and preventing recurrent hospitalization as she nears EOL. Answered questions regarding home hospice services and DME. Family will likely hire additional caregiver support in the home. Trulus is interested in [facility] hospice agency. We discussed transfer to residential hospice facility from home if necessary for EOL care (2 weeks or less) and if symptom burden worsens at home.   Answered questions and concerns. Trulus has PMT contact information and encouraged her to call PMT provider for needs this week. Reassured of support from [facility] team RN/SW with discharge home with hospice.   Updated RN CM and Dr. Grandville Silos.    Length of Stay: 2  Current Medications: Scheduled Meds:  . amLODipine  10 mg  Oral QODAY  . cholecalciferol  1,000 Units Oral Daily  . ferrous sulfate  325 mg Oral Q breakfast  . melatonin  5 mg Oral QHS  . pantoprazole  40 mg Oral BID  . sucralfate  1 g Oral TID WC & HS  . vitamin B-12  1,000 mcg Oral QODAY    Continuous Infusions: . sodium chloride    . sodium chloride 50 mL/hr at 04/21/20 0646    PRN Meds: acetaminophen **OR** acetaminophen, fluticasone, HYDROcodone-acetaminophen, morphine injection, promethazine, senna-docusate, sorbitol  Physical Exam          Vital Signs: BP (!) 114/52 (BP Location: Left Wrist)   Pulse 90   Temp 98.1 F (  36.7 C)   Resp 20   LMP  (LMP Unknown)   SpO2 97%  SpO2: SpO2: 97 % O2 Device: O2 Device: Nasal Cannula O2 Flow Rate: O2 Flow Rate (L/min): 2 L/min  Intake/output summary:   Intake/Output Summary (Last 24 hours) at 04/21/2020 1200 Last data filed at 04/21/2020 7939 Gross per 24 hour  Intake 1272.18 ml  Output --  Net 1272.18 ml   LBM: Last BM Date: 04/20/20 Baseline Weight:   Most recent weight:         Palliative Assessment/Data: PPS 50%   Patient Active Problem List   Diagnosis Date Noted  . Generalized weakness   . Palliative care by specialist   . Failure to thrive in adult 04/19/2020  . Thrombocytopenia ([place]) 04/19/2020  . Iron deficiency anemia 04/16/2020  . Hiatal hernia   . Gastritis and gastroduodenitis   . Multiple gastric ulcers   . Multiple duodenal ulcers   . Lymphoma ([place]) 03/29/2020  . Upper GI bleed 03/29/2020  . Edema of lower extremity 03/29/2020  . AKI (acute kidney injury) ([place]) 03/29/2020  . Upper GI bleeding 03/29/2020  . Large cell (diffuse) non-Hodgkin's lymphoma ([place]) 11/04/2018  . Hypokalemia   . Anemia   . Hypocalcemia   . Hypomagnesemia   . Goals of care, counseling/discussion   . Diffuse large B cell lymphoma ([place]) 08/29/2018  . Counseling regarding advance care planning and goals of care 08/29/2018  . Diffuse large B-cell lymphoma of lymph nodes of  multiple regions ([place]) 08/29/2018  . Hx of diverticulitis of colon 12/12/2011  . Leukopenia 12/12/2011  . Syncope 12/12/2011  . HH (hiatus hernia) 12/12/2011  . 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 GERD 05/12/2008    Palliative Care Assessment & Plan   Patient Profile:  79 y.o. 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 Hospital admission for acute blood loss anemia, probable recurrent upper GI bleeding and underlying diffuse large B-cell lymphoma stage IV. Oncology consult pending. Palliative medicine consultation for goals of care.   Assessment: Acute blood loss anemia Probable recurrent upper GI bleeding secondary to duodenal ulcers Diffuse large B-cell lymphoma stage IV Thrombocytopenia AKI Adult failure to thrive  Recommendations/Plan:  Appreciate oncology f/u with patient and daughter. Daughter confirms patient's decision against further oncology workup and treatment. Patient wishes to discharge home with support from hospice services.   TOC team consulted to arrange home hospice services. Will need DME.   Continue current plan of care and medical management inpatient.    Code Status: DNR   Code Status Orders  (From admission, onward)         Start     Ordered   04/19/20 2003  Do not attempt resuscitation (DNR)  Continuous       Question Answer Comment  In the event of cardiac or respiratory ARREST Do not call a "code blue"   In the event of cardiac or respiratory ARREST Do not perform Intubation, CPR, defibrillation or ACLS   In the event of cardiac or respiratory ARREST Use medication by any route, position, wound care, and other  measures to relive pain and suffering. May use oxygen, suction and manual treatment of airway obstruction as needed for comfort.  04/19/20 2014        Code Status History    Date Active Date Inactive Code Status Order ID Comments User Context   03/29/2020 0600 03/31/2020 1803 DNR [id number]  Shela Leff, MD ED   03/29/2020 0531 03/29/2020 0600 DNR [id number]  Shela Leff, MD ED   11/25/2018 1051 11/29/2018 1758 Full Code [id number]  Brunetta Genera, MD Inpatient   11/04/2018 1112 11/08/2018 1427 Full Code [id number]  Brunetta Genera, MD Inpatient   10/14/2018 1104 10/18/2018 1723 Full Code [id number]  Brunetta Genera, MD Inpatient   09/23/2018 1200 09/27/2018 1556 Full Code [id number]  Brunetta Genera, MD Inpatient   09/02/2018 [phone] 09/06/2018 1728 Full Code [id number]  Brunetta Genera, MD Inpatient   Advance Care Planning Activity       Prognosis:   Poor long-term prognosis: months if not weeks  Discharge Planning:  Home with Hospice  Care plan was discussed with RN, Dr. Grandville Silos, RN CM, daughter (Trulus)  Thank you for allowing the Palliative Medicine Team to assist in the care of this patient.   Total Time 53min Prolonged Time Billed  no      Greater than 50%  of this time was spent counseling and coordinating care related to the above assessment and plan.  Ihor Dow, DNP, FNP-C Palliative Medicine Team  Phone: [phone] Fax: [phone]  Please contact Palliative Medicine Team phone at [phone] for questions and concerns.

## 2020-04-21 NOTE — TOC Transition Note (Signed)
Transition of Care [facility]) - CM/SW Discharge Note   Patient Details  Name: Taylor Huynh MRN: [id number] Date of Birth: [birth year]  Transition of Care [facility]) CM/SW Contact:  Dessa Phi, RN Phone Number: 04/21/2020, 12:18 PM   Clinical Narrative:  Damaris Schooner to dtr about home hospice referral-[facility] chosen-rep Anderson Malta aware & following for acceptance. dme needed:02. May need 3n1,bedside table. They decline hospital bed.DNR in shadow chart for MD signature. PTAR may be needed-will have just in case. No further CM needs.     Final next level of care: Home w Hospice Care Barriers to Discharge: No Barriers Identified   Patient Goals and CMS Choice Patient states their goals for this hospitalization and ongoing recovery are:: go home CMS Medicare.gov Compare Post Acute Care list provided to:: Patient Represenative (must comment) (dtr [name]) Choice offered to / list presented to : Adult Children  Discharge Placement                       Discharge Plan and Services   Discharge Planning Services: CM Consult Post Acute Care Choice: Hospice                    HH Arranged: RN [facility] Agency: Hospice and [place] Date [place]: 04/21/20 Time [place]: 1217 Representative spoke with at [place]: [place] Determinants of Health ([place]) Interventions     Readmission Risk Interventions No flowsheet data found.

## 2020-04-21 NOTE — TOC Transition Note (Signed)
Transition of Care [facility]) - CM/SW Discharge Note   Patient Details  Name: Taylor Huynh MRN: [id number] Date of Birth: 09/09/1940  Transition of Care [facility]) CM/SW Contact:  Dessa Phi, RN Phone Number: 04/21/2020, 3:59 PM   Clinical Narrative: [facility] care accepted-rep Latanya Presser following for d/c-d/c today home w/hospice-dme to arrive in home prior patient coming home-02,hospital bed,bedside table,3n1-dtr Trulus,spouse aware of calling hospital floor when dme arrives to home. Patient is adamant about going home today.They are aware it may be late. PTAR called to be placed on schedule as Will Call-nurse must call PTAR once dme in the home. PTAR forms @ nsg [facility]. No further CM needs.      Final next level of care: Home w Hospice Care Barriers to Discharge: No Barriers Identified   Patient Goals and CMS Choice Patient states their goals for this hospitalization and ongoing recovery are:: go home CMS Medicare.gov Compare Post Acute Care list provided to:: Patient Represenative (must comment) (dtr [name]) Choice offered to / list presented to : Adult Children  Discharge Placement                       Discharge Plan and Services   Discharge Planning Services: CM Consult Post Acute Care Choice: Hospice                    HH Arranged: RN [facility] Agency: Hospice and [place] Date [place]: 04/21/20 Time [place]: 1217 Representative spoke with at [place]: [place] Determinants of Health ([place]) Interventions     Readmission Risk Interventions No flowsheet data found.

## 2020-04-21 NOTE — TOC Initial Note (Addendum)
Transition of Care [facility]) - Initial/Assessment Note    Patient Details  Name: Taylor Huynh MRN: [id number] Date of Birth: 02/18/1941  Transition of [facility]) CM/SW Contact:    Dessa Phi, RN Phone Number: 04/21/2020, 11:07 AM  Clinical Narrative: Spoke to dtr about d/c plans-home w/services-used [facility] in past will use again if needed-rep Malachy Mood following;dtr says her mom doesn't want any more chemo or treatments-informed palliative care will discuss goals of care. On oxygen monitor if needed @ home.PT ordered. Await recc.                  Expected Discharge Plan: [place] Barriers to Discharge: Continued Medical Work up   Patient Goals and CMS Choice Patient states their goals for this hospitalization and ongoing recovery are:: go home CMS Medicare.gov Compare Post Acute Care list provided to:: Patient Represenative (must comment) (dtr [name]) Choice offered to / list presented to : Adult Children  Expected Discharge Plan and Services Expected Discharge Plan: [place]   Discharge Planning Services: CM Consult Post Acute Care Choice: [place] arrangements for the past 2 months: Single Family Home                                      Prior Living Arrangements/Services Living arrangements for the past 2 months: Single Family Home Lives with:: Adult Children   Do you feel safe going back to the place where you live?: Yes      Need for Family Participation in Patient Care: No (Comment) Care giver support system in place?: Yes (comment) Current home services: DME (rw) Criminal Activity/Legal Involvement Pertinent to Current Situation/Hospitalization: No - Comment as needed  Activities of Daily Living Home Assistive Devices/Equipment: Dentures (specify type), Eyeglasses, Grab bars in shower ADL Screening (condition at time of admission) Patient's cognitive ability adequate to safely complete daily  activities?: Yes Is the patient deaf or have difficulty hearing?: No Does the patient have difficulty seeing, even when wearing glasses/contacts?: No Does the patient have difficulty concentrating, remembering, or making decisions?: Yes (per patient) Patient able to express need for assistance with ADLs?: Yes Does the patient have difficulty dressing or bathing?: No Independently performs ADLs?: Yes (appropriate for developmental age) Does the patient have difficulty walking or climbing stairs?: Yes Weakness of Legs: Both Weakness of Arms/Hands: None  Permission Sought/Granted Permission sought to share information with : Case Manager Permission granted to share information with : Yes, Verbal Permission Granted  Share Information with NAME: Case Manager           Emotional Assessment Appearance:: Appears stated age Attitude/Demeanor/Rapport: Gracious Affect (typically observed): Accepting Orientation: : Oriented to Self, Oriented to Place, Oriented to  Time Alcohol / Substance Use: Not Applicable Psych Involvement: No (comment)  Admission diagnosis:  Hypercalcemia [E83.52] Failure to thrive in adult [R62.7] Generalized weakness [R53.1] Symptomatic anemia [D64.9] Lymphoma, unspecified body region, unspecified lymphoma type ([place]) [C85.90] Patient Active Problem List   Diagnosis Date Noted  . Generalized weakness   . Palliative care by specialist   . Failure to thrive in adult 04/19/2020  . Thrombocytopenia ([place]) 04/19/2020  . Iron deficiency anemia 04/16/2020  . Hiatal hernia   . Gastritis and gastroduodenitis   . Multiple gastric ulcers   . Multiple duodenal ulcers   . Lymphoma ([place]) 03/29/2020  . Upper GI bleed 03/29/2020  .  Edema of lower extremity 03/29/2020  . AKI (acute kidney injury) ([place]) 03/29/2020  . Upper GI bleeding 03/29/2020  . Large cell (diffuse) non-Hodgkin's lymphoma ([place]) 11/04/2018  . Hypokalemia   . Anemia   . Hypocalcemia   . Hypomagnesemia   .  Goals of care, counseling/discussion   . Diffuse large B cell lymphoma ([place]) 08/29/2018  . Counseling regarding advance care planning and goals of care 08/29/2018  . 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[place]) 08/29/2018  . Hx of diverticulitis of colon 12/12/2011  . Leukopenia 12/12/2011  . Syncope 12/12/2011  . HH (hiatus hernia) 12/12/2011  . 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 GERD 05/12/2008   PCP:  [name], MD Pharmacy:   [facility] [address] ([address] [place] - [address] [place] ([place]) [place] [address] Phone: [phone] Fax: [phone]     Social Determinants of Health (SDOH) Interventions    Readmission Risk Interventions No flowsheet data found.

## 2020-04-21 NOTE — Progress Notes (Signed)
Hydrologist [facility]) Hospital Liaison: RN note     Notified by Transition of [place], RN  of patient/family request for [facility] services at home after discharge. Chart and patient information under review by [facility] physician. Hospice eligibility pending currently.     Writer spoke with daughter, Trulus to initiate education related to hospice philosophy, services and team approach to care.Trulus verbalized understanding of information given. Per discussion, plan is for discharge to home by  PTAR.   Please send signed and completed DNR form home with patient/family. Patient will need prescriptions for discharge comfort medications.      DME needs have been discussed, patient currently has the following equipment in the home: walker.  Patient/family requests the following DME for delivery to the home: oxygen, 3N1 and OBT. [place] equipment manager has been notified and will contact DME provider to arrange delivery to the home. Home address has been verified and is correct in the chart. Trulus  is the family member to contact to arrange time of delivery.      [facility] aware of the above. Please notify [facility] when patient is ready to leave the unit at discharge. (Call [phone] or [phone] after 5pm.) [facility] information and contact numbers given to  Trulus.       Please call with any hospice related questions.      Thank you for this referral.     Farrel Gordon, RN, [facility] (listed on [place] under [place])   [phone]

## 2020-04-21 NOTE — Discharge Summary (Signed)
Physician Discharge Summary  Taylor Huynh PJA:[id number] DOB: March 27, 1941 DOA: 04/19/2020  PCP: [name], MD  Admit date: 04/19/2020 Discharge date: 04/21/2020  Time spent: 50 minutes  Recommendations for Outpatient Follow-up:  1. Patient be discharged home with hospice services following. 2. Follow-up with [name], MD in 2 weeks.   Discharge Diagnoses:  Principal Problem:   Anemia Active Problems:   GERD   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HCC)   Goals of care, counseling/discussion   Upper GI bleed   Hiatal hernia   Gastritis and gastroduodenitis   Multiple gastric ulcers   Multiple duodenal ulcers   Failure to thrive in adult   Thrombocytopenia (HCC)   Generalized weakness   Palliative care by specialist   Discharge Condition: Stable  Diet recommendation: Regular  There were no vitals filed for this visit.  History of present illness:  HPI per Dr. Benny Lennert The patient is a 79 yr old woman who was discharged from this facility on 03/31/2020 after a 3 day stay for Upper GI Bleed and AKI. She was found to have gastroduodenitis and multiple gastric and duodenal ulcers. Her hemoglobin at the time fo discharge was 8.0. It is 7.1 today. Her FOBT is positive, and the patient gives a history of dark stool, but she was taking supplemental iron at home.  The patient has a past medical history significant for NHL, GERD, hiatal hernia, hypertension, leukopenia, seizures, and syncope.  In the ED, the patient has been found to have a temperature of 99., tachypnea in the 30's, heart rates ranging from 98 to 110, and a blood pressure which has dropped from 143/75 to 101.57. She is saturating 96% on 2L.  Creatinine is elevated at 1.21 from her previous creatinine to 0.88. Albumin is 2.3. WBC is 4.6 and hemoglobin and 7.1. Platelets are 53. INR is 1.2. Troponins are 10 and 9. Trending down.  CXR demonstrated left greater than  right bilateral lung nodules and pleural masses consistent with metastatic disease. No acute airspace disease.  The patient denied fevers, chills, nausea or vomiting. She has had melena, weakness, falls, and poor PO intake.   [facility] have been consulted to admit the patient for further evaluation and treatment.  Hospital Course:  #1 acute blood loss anemia, POA Likely secondary to probable recurrent upper GI bleed secondary to gastric and duodenal ulcers which is secondary to patient's lymphoma.  Biopsies done.  Patient noted on admission to have a hemoglobin of 7.1.  GI was consulted and patient seen in consultation by Dr. Ardis Hughs (note noted that patient has lymphoma throughout her abdomen with bulky adenopathy even causing ulcers in the esophagus, stomach and duodenum which are positive for NHL per EGD 3 weeks ago.  Per GI no endoscopic options are available to treat her cancer.  GI recommended PPI twice daily, Carafate 3 times daily may or may not help palliate his symptoms but reasonable to continue those medications and to transfuse as needed for hemoglobin less than 7.  Patient noted to be declining chemotherapy with for her lymphoma.  And per GI with no treatment in lymphoma sites will continue to grow and patient will likely lead to more bleeding or other symptoms.  Patient is hemoglobin stabilized at 7.3 by day of discharge.  Palliative care was consulted and follow the patient throughout the hospitalization.  Decision was made to discharge patient home with hospice.  Patient will be discharged home with hospice in stable condition.  2.  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  Oncology was consulted and patient seen in consultation by Dr. Irene Limbo, who discussed with patient's daughter about patient's lymphoma and potential treatment approaches.  Patient noted to not be keen to pursue any  life-prolonging treatments at this time and did not feel she can handle the burden of treatments and close follow-up which that would necessitate.  Per oncology note patient noted that she would like to be kept comfortable and both patient and daughter would like to pursue supportive care through hospice.  Palliative care was consulted to follow the patient throughout the hospitalization.  Decision was made to discharge patient home with hospice following.  3.  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  Remained stable.  Patient with no overt bleeding.  4.  Acute kidney injury Noted on admission felt secondary to acute blood loss anemia/dehydration.  Patient hydrated with IV fluids, with resolution of acute kidney injury by day of discharge.  Outpatient follow-up.  5.  Gastroesophageal reflux/esophagitis Patient maintained on PPI twice daily as well as Carafate.  6.  Adult failure to thrive Secondary to problem #2.  Patient with poor oral intake.  Patient seen by palliative care and decision was made to discharge patient home with hospice following.  Procedures:  Chest x-ray 04/19/2020    Consultations:  Gastroenterology: Dr. Ardis Hughs 04/20/2020  Palliative care: Ihor Dow, NP 04/20/2020  Oncology: Dr. Irene Limbo 04/20/2020  Discharge Exam: Vitals:   04/21/20 0521 04/21/20 1350  BP: (!) 114/52 (!) 105/56  Pulse: 90 95  Resp: 20 20  Temp: 98.1 F (36.7 C) 98.6 F (37 C)  SpO2: 97% 97%    General: NAD Cardiovascular: RRR Respiratory: CTAB  Discharge Instructions   Discharge Instructions    Diet general   Complete by: As directed    Increase activity slowly   Complete by: As directed      Allergies as of 04/21/2020      Reactions   Strawberry (diagnostic) Itching   Tomato Itching      Medication List    TAKE these medications   (feeding supplement) PROSource Plus liquid Take 30 mLs by mouth 2 (two) times daily between meals.   amLODipine 10 MG tablet Commonly  known as: NORVASC Take 10 mg by mouth every other day.   cholecalciferol 1000 units tablet Commonly known as: VITAMIN D Take 1,000 Units by mouth daily.   famotidine 20 MG tablet Commonly known as: PEPCID Take 20 mg by mouth at bedtime as needed for heartburn.   ferrous sulfate 325 (65 FE) MG tablet Take 1 tablet (325 mg total) by mouth daily with breakfast.   fluticasone 50 MCG/ACT nasal spray Commonly known as: FLONASE Place 1 spray into both nostrils daily as needed for allergies or rhinitis.   HYDROcodone-acetaminophen 5-325 MG tablet Commonly known as: NORCO/VICODIN Take 1-2 tablets by mouth every 4 (four) hours as needed for moderate pain.   lidocaine-prilocaine cream Commonly known as: EMLA Apply 1 application topically as needed.   melatonin 5 MG Tabs Take 5 mg by mouth at bedtime.   ondansetron 4 MG tablet Commonly known as: Zofran Take 1 tablet (4 mg total) by mouth every 6 (six) hours as needed for nausea or vomiting.   pantoprazole 40 MG tablet Commonly known as: PROTONIX Take 1 tablet (40 mg total) by mouth 2 (two) times daily.   prednisoLONE acetate 1 % ophthalmic suspension Commonly  known as: PRED FORTE Place 1 drop into both eyes daily.   promethazine 12.5 MG tablet Commonly known as: PHENERGAN Take 1 tablet (12.5 mg total) by mouth every 6 (six) hours as needed for nausea.   sucralfate 1 g tablet Commonly known as: CARAFATE Take 1 tablet (1 g total) by mouth 4 (four) times daily -  with meals and at bedtime. What changed: when to take this   vitamin B-12 1000 MCG tablet Commonly known as: CYANOCOBALAMIN Take 1,000 mcg by mouth daily.      Allergies  Allergen Reactions  . Strawberry (Diagnostic) Itching  . Tomato Itching    Follow-up Information    [name], MD. Schedule an appointment as soon as possible for a visit in 2 week(s).   Specialty: Internal Medicine Contact information: [address] [place]  [place] [address] [phone]                The results of significant diagnostics from this hospitalization (including imaging, microbiology, ancillary and laboratory) are listed below for reference.    Significant Diagnostic Studies: DG Chest 2 View  Result Date: 03/28/2020 CLINICAL DATA:  Shortness of breath, weakness and lethargy. Non-Hodgkin's lymphoma. EXAM: CHEST - 2 VIEW COMPARISON:  PET-CT dated 05/14/2019. Chest radiographs dated 06/11/2016. FINDINGS: Interval multiple rounded and oval masses in both lungs, greater on the left. Some of these are pleural based and others appear to be within the lung parenchyma. Otherwise, the lungs are clear. Normal sized heart. Thoracic spine degenerative changes. IMPRESSION: Interval multiple bilateral lung masses, greater on the left, most compatible with metastatic disease and most likely related to the patient's non-Hodgkin's lymphoma. Electronically Signed   By: Claudie Revering M.D.   On: 03/28/2020 14:54   CT Angio Chest PE W and/or Wo Contrast  Result Date: 03/29/2020 CLINICAL DATA:  B-cell lymphoma. Anorexia, vomiting. Bowel obstruction suspected. EXAM: CT ANGIOGRAPHY CHEST WITH CONTRAST TECHNIQUE: Multidetector CT imaging of the chest was performed using the standard protocol during bolus administration of intravenous contrast. Multiplanar CT image reconstructions and MIPs were obtained to evaluate the vascular anatomy. CONTRAST:  182mL OMNIPAQUE IOHEXOL 350 MG/ML SOLN COMPARISON:  Chest CT 02/10/2010.  PET CT 08/22/2018 FINDINGS: Cardiovascular: Heart is normal size. Aorta normal caliber with scattered calcifications. Mediastinum/Nodes: Borderline right paratracheal lymph node with a short axis diameter of 9 mm. No axillary adenopathy. Borderline left hilar lymph nodes with a short axis diameter of 11 mm. Lungs/Pleura: Extensive bilateral pulmonary nodules and masses, most peripherally. Index left lower lobe mass measures 4 cm on image 78. Index right  lower lobe mass measures 5 cm on image 87. no effusions. Upper Abdomen: Imaging into the upper abdomen demonstrates no acute findings. Musculoskeletal: Chest wall soft tissues are unremarkable. No acute bony abnormality. Review of the MIP images confirms the above findings. IMPRESSION: Innumerable bilateral pulmonary nodules and masses most compatible with metastatic disease. Borderline sized right paratracheal lymph nodes and left hilar lymph nodes. Aortic Atherosclerosis (ICD10-I70.0). Electronically Signed   By: Rolm Baptise M.D.   On: 03/29/2020 03:04   CT ABDOMEN PELVIS W CONTRAST  Result Date: 03/29/2020 CLINICAL DATA:  B-cell lymphoma. Bowel obstruction suspected. Anorexia, vomiting EXAM: CT ABDOMEN AND PELVIS WITH CONTRAST TECHNIQUE: Multidetector CT imaging of the abdomen and pelvis was performed using the standard protocol following bolus administration of intravenous contrast. CONTRAST:  117mL OMNIPAQUE IOHEXOL 350 MG/ML SOLN COMPARISON:  07/18/2018 FINDINGS: Lower chest: Innumerable bilateral pulmonary nodules and masses seen as seen on  chest CT today. Heart is normal size. Hepatobiliary: No focal liver abnormality is seen. Status post cholecystectomy. No biliary dilatation. Pancreas: No focal abnormality or ductal dilatation. Spleen: No focal abnormality.  Normal size. Adrenals/Urinary Tract: Bilateral renal cysts. No adrenal mass or hydronephrosis. Urinary bladder decompressed, grossly unremarkable. Stomach/Bowel: Colonic diverticulosis. No active diverticulitis. No evidence of bowel obstruction. Stomach and small bowel decompressed, grossly unremarkable. Vascular/Lymphatic: Aortic atherosclerosis. No aneurysm. Bulky retroperitoneal adenopathy. Conglomerate nodal mass in the left periaortic region measures up to 6.4 cm. Numerous other retroperitoneal periaortic lymph nodes as well as pelvic sidewall/iliac chain lymph nodes bilaterally right pelvic sidewall lymph node on image 69 as a diameter of 4.7  cm. Right inguinal adenopathy. Short axis diameter measures 2.1 cm. Reproductive: Prior hysterectomy.  No adnexal masses. Other: No free fluid or free air. Musculoskeletal: No acute bony abnormality. IMPRESSION: Innumerable bilateral pulmonary nodules and masses in the visualized lung bases. See chest CT report. Bulky retroperitoneal/periaortic and pelvic sidewall adenopathy. Right inguinal adenopathy. Colonic diverticulosis. Aortic atherosclerosis. Electronically Signed   By: Rolm Baptise M.D.   On: 03/29/2020 03:07   DG Chest Port 1 View  Result Date: 04/19/2020 CLINICAL DATA:  Weakness EXAM: PORTABLE CHEST 1 VIEW COMPARISON:  03/28/2020, CT 03/29/2020 FINDINGS: Left greater than right bilateral lung nodules and pleural masses consistent with metastatic disease, some of which appears less conspicuous compared to prior radiograph. No acute airspace disease or pleural effusion. Stable cardiomediastinal silhouette. No pneumothorax. IMPRESSION: Left greater than right bilateral lung nodules and pleural masses consistent with metastatic disease, some of which appears less conspicuous compared to prior radiograph. No definite acute airspace disease. Electronically Signed   By: Donavan Foil M.D.   On: 04/19/2020 17:41   VAS [place] LOWER EXTREMITY VENOUS (DVT)  Result Date: 03/31/2020  Lower Venous DVTStudy Indications: Swelling.  Risk Factors: Cancer. Limitations: Poor ultrasound/tissue interface. Comparison Study: No prior studies. Performing Technologist: Oliver Hum RVT  Examination Guidelines: A complete evaluation includes B-mode imaging, spectral Doppler, color Doppler, and power Doppler as needed of all accessible portions of each vessel. Bilateral testing is considered an integral part of a complete examination. Limited examinations for reoccurring indications may be performed as noted. The reflux portion of the exam is performed with the patient in reverse Trendelenburg.   +---------+---------------+---------+-----------+----------+--------------+ RIGHT    CompressibilityPhasicitySpontaneityPropertiesThrombus Aging +---------+---------------+---------+-----------+----------+--------------+ CFV      Full           Yes      Yes                                 +---------+---------------+---------+-----------+----------+--------------+ SFJ      Full                                                        +---------+---------------+---------+-----------+----------+--------------+ FV Prox  Full                                                        +---------+---------------+---------+-----------+----------+--------------+ FV Mid   Full           Yes  Yes                                 +---------+---------------+---------+-----------+----------+--------------+ FV Distal               Yes      Yes                                 +---------+---------------+---------+-----------+----------+--------------+ PFV      Full                                                        +---------+---------------+---------+-----------+----------+--------------+ POP      Full           Yes      Yes                                 +---------+---------------+---------+-----------+----------+--------------+ PTV      Full                                                        +---------+---------------+---------+-----------+----------+--------------+ PERO     Full                                                        +---------+---------------+---------+-----------+----------+--------------+   +----+---------------+---------+-----------+----------+--------------+ LEFTCompressibilityPhasicitySpontaneityPropertiesThrombus Aging +----+---------------+---------+-----------+----------+--------------+ CFV Full           Yes      Yes                                  +----+---------------+---------+-----------+----------+--------------+     Summary: RIGHT: - There is no evidence of deep vein thrombosis in the lower extremity. However, portions of this examination were limited- see technologist comments above.  - No cystic structure found in the popliteal fossa. - Ultrasound characteristics of enlarged lymph nodes are noted in the groin.  LEFT: - No evidence of common femoral vein obstruction.  *See table(s) above for measurements and observations. Electronically signed by Monica Martinez MD on 03/31/2020 at 1:50:41 PM.    Final     Microbiology: Recent Results (from the past 240 hour(s))  Respiratory Panel by RT PCR (Flu A&B, Covid) - Nasopharyngeal Swab     Status: None   Collection Time: 04/19/20  5:38 PM   Specimen: Nasopharyngeal Swab  Result Value Ref Range Status   SARS Coronavirus 2 by RT PCR NEGATIVE NEGATIVE Final    Comment: (NOTE) SARS-CoV-2 target nucleic acids are NOT DETECTED.  The SARS-CoV-2 RNA is generally detectable in upper respiratoy specimens during the acute phase of infection. The lowest concentration of SARS-CoV-2 viral copies this assay can detect is 131 copies/mL. A negative result does not preclude SARS-Cov-2 infection and should not be used as the sole basis for  treatment or other patient management decisions. A negative result may occur with  improper specimen collection/handling, submission of specimen other than nasopharyngeal swab, presence of viral mutation(s) within the areas targeted by this assay, and inadequate number of viral copies (<131 copies/mL). A negative result must be combined with clinical observations, patient history, and epidemiological information. The expected result is Negative.  Fact Sheet for Patients:  PinkCheek.be  Fact Sheet for Healthcare Providers:  GravelBags.it  This test is no t yet approved or cleared by the [place] FDA  and  has been authorized for detection and/or diagnosis of SARS-CoV-2 by FDA under an Emergency Use Authorization (EUA). This EUA will remain  in effect (meaning this test can be used) for the duration of the COVID-19 declaration under Section 564(b)(1) of the Act, 21 U.S.C. section 360bbb-3(b)(1), unless the authorization is terminated or revoked sooner.     Influenza A by PCR NEGATIVE NEGATIVE Final   Influenza B by PCR NEGATIVE NEGATIVE Final    Comment: (NOTE) The Xpert Xpress SARS-CoV-2/FLU/RSV assay is intended as an aid in  the diagnosis of influenza from Nasopharyngeal swab specimens and  should not be used as a sole basis for treatment. Nasal washings and  aspirates are unacceptable for Xpert Xpress SARS-CoV-2/FLU/RSV  testing.  Fact Sheet for Patients: PinkCheek.be  Fact Sheet for Healthcare Providers: GravelBags.it  This test is not yet approved or cleared by the [place] FDA and  has been authorized for detection and/or diagnosis of SARS-CoV-2 by  FDA under an Emergency Use Authorization (EUA). This EUA will remain  in effect (meaning this test can be used) for the duration of the  Covid-19 declaration under Section 564(b)(1) of the Act, 21  U.S.C. section 360bbb-3(b)(1), unless the authorization is  terminated or revoked. Performed at [facility], [place] [address] [place], [place] [address]      Labs: Basic Metabolic Panel: Recent Labs  Lab 04/19/20 1738 04/20/20 0345 04/21/20 0448  NA 141 141 141  K 4.3 3.9 3.8  CL 103 107 109  CO2 25 22 23   GLUCOSE 81 84 84  BUN 39* 37* 33*  CREATININE 1.21* 1.09* 0.98  CALCIUM 11.2* 10.4* 10.0   Liver Function Tests: Recent Labs  Lab 04/19/20 1738 04/20/20 0345 04/21/20 0448  AST 47* 43* 47*  ALT 23 21 22   ALKPHOS 38 33* 35*  BILITOT 1.1 1.5* 1.1  PROT 4.7* 4.2* 4.2*  ALBUMIN 2.3* 2.1* 2.0*   Recent Labs  Lab  04/19/20 1738  LIPASE 40   No results for input(s): AMMONIA in the last 168 hours. CBC: Recent Labs  Lab 04/19/20 1738 04/20/20 0345 04/21/20 0448  WBC 4.6 4.7 4.1  NEUTROABS 3.2  --  2.9  HGB 7.1* 7.2* 7.3*  HCT 22.8* 22.7* 24.6*  MCV 90.8 91.9 94.3  PLT 53* 42* 40*   Cardiac Enzymes: No results for input(s): CKTOTAL, CKMB, CKMBINDEX, TROPONINI in the last 168 hours. BNP: BNP (last 3 results) No results for input(s): BNP in the last 8760 hours.  ProBNP (last 3 results) No results for input(s): PROBNP in the last 8760 hours.  CBG: No results for input(s): GLUCAP in the last 168 hours.     Signed:  Irine Seal MD.  [facility] 04/21/2020, 3:46 PM

## 2020-04-21 NOTE — Progress Notes (Signed)
PT Cancellation Note  Patient Details Name: Taylor Huynh MRN: [id number] DOB: 05-23-1941   Cancelled Treatment:    Reason Eval/Treat Not Completed: PT screened, no needs identified, will sign off Order received. Chart reviewed. Plan is for home with hospice with [facility] transport. Will sign off at this time.    [facility]  Office: [phone] Pager: [phone]

## 2020-04-21 NOTE — Progress Notes (Signed)
Initial Nutrition Assessment  DOCUMENTATION CODES:   Not applicable  INTERVENTION:  - will order Boost Breeze TID, each supplement provides 250 kcal and 9 grams of protein. - will order 30 ml Prosource Plus BID, each supplement provides 100 kcal and 15 grams protein.  - will order 1 tablet multivitamin with minerals/day. - diet advancement as medically feasible. * weigh patient today as she has not been weighed since 9/6.  NUTRITION DIAGNOSIS:   Increased nutrient needs related to acute illness, cancer and cancer related treatments, catabolic illness as evidenced by estimated needs.  GOAL:   Patient will meet greater than or equal to 90% of their needs  MONITOR:   PO intake, Supplement acceptance, Diet advancement, Labs, Weight trends  REASON FOR ASSESSMENT:   Malnutrition Screening Tool  ASSESSMENT:   79 y.o. female with medical history of hiatal hernia, HTN, seizures, GERD, leukopenia, and non-Hodgkin's lymphoma. She presented to the ED with generalized weakness, poor PO intake, intermittent N/V, recurrent falls, RLE swelling, and shortness of breath. She was discharged from the hospital on 9/8 and during that admission was found to have gastroduodenitis and multiple gastric and duodenal ulcers. In the ED, FOBT was positive. CXR showed L>R bilateral lung nodules and pleural masses that are consistent with metastatic disease.  Patient is on CLD and consumed 90% of breakfast this AM (234 kcal, 0 grams protein). Lunch tray was at bedside at the time of RD visit, untouched.   Patient laying in bed and her husband is at bedside. She has not vomited since admission but continues to have some level of abdominal discomfort and nausea and is not interested in lunch at this time.   Patient was fairly drowsy during visit, closing her eyes often, and husband was somewhat hard of hearing making interaction more difficult. Husband reports that for 2-3 days PTA patient was experiencing N/V  with nearly all intakes, including water. She was consuming items such as liquids, grits, and applesauce and neither patient nor her husband can recall the last time she ate solid food.  She has not been weighed since 9/6 at which time she was 173 lb. Prior to that, most recent weight was on 05/22/19 when she weighed 167 lb.     Labs reviewed; BUN: 33 mg/dl. Medications reviewed; 1000 mcg cholecalciferol/day, 325 mg ferrous sulfate/day,  5 mg melatonin/day, 40 mg oral protonix BID, 1 g carafate TID, 1000 mcg oral cyanocobalamin every other day.  IVF; NS @ 50 ml/hr.    NUTRITION - FOCUSED PHYSICAL EXAM:    Most Recent Value  Orbital Region No depletion  Upper Arm Region No depletion  Thoracic and Lumbar Region No depletion  Buccal Region No depletion  Temple Region No depletion  Clavicle Bone Region No depletion  Clavicle and Acromion Bone Region No depletion  Scapular Bone Region Unable to assess  Dorsal Hand No depletion  Patellar Region Unable to assess  Anterior Thigh Region Unable to assess  Posterior Calf Region Unable to assess  Edema (RD Assessment) Unable to assess  Hair Reviewed  Eyes Reviewed  Mouth Unable to assess  Skin Reviewed  Nails Reviewed       Diet Order:   Diet Order            Diet clear liquid Room service appropriate? Yes; Fluid consistency: Thin  Diet effective now                 EDUCATION NEEDS:   No education needs have been  identified at this time  Skin:  Skin Assessment: Reviewed RN Assessment  Last BM:  9/29 (type 6)  Height:   Ht Readings from Last 1 Encounters:  03/29/20 5\' 4"  (1.626 m)    Weight:   Wt Readings from Last 1 Encounters:  03/29/20 78.9 kg    Estimated Nutritional Needs:  Kcal:  1800-2000 kcal Protein:  90-105 grams Fluid:  >/= 2 L/day     Jarome Matin, MS, RD, LDN, CNSC Inpatient Clinical Dietitian RD pager # available in [facility]  After hours/weekend pager # available in [facility]

## 2020-04-21 NOTE — Plan of Care (Signed)

## 2020-04-26 ENCOUNTER — Ambulatory Visit: Payer: Medicare Other | Admitting: Nurse Practitioner

## 2020-05-24 DEATH — deceased

## 2020-06-30 NOTE — Telephone Encounter (Signed)
No additional notes needed
